# Patient Record
Sex: Male | Born: 1955 | Race: Black or African American | Hispanic: No | Smoking: Former smoker
Health system: Southern US, Community
[De-identification: ages and names within clinical notes are randomized; demographics above are authoritative.]

## PROBLEM LIST (undated history)

## (undated) DIAGNOSIS — E7800 Pure hypercholesterolemia, unspecified: Secondary | ICD-10-CM

## (undated) DIAGNOSIS — I10 Essential (primary) hypertension: Secondary | ICD-10-CM

## (undated) DIAGNOSIS — I82409 Acute embolism and thrombosis of unspecified deep veins of unspecified lower extremity: Secondary | ICD-10-CM

## (undated) DIAGNOSIS — R519 Headache, unspecified: Secondary | ICD-10-CM

## (undated) DIAGNOSIS — F32A Depression, unspecified: Secondary | ICD-10-CM

## (undated) DIAGNOSIS — K219 Gastro-esophageal reflux disease without esophagitis: Secondary | ICD-10-CM

## (undated) DIAGNOSIS — F329 Major depressive disorder, single episode, unspecified: Secondary | ICD-10-CM

## (undated) DIAGNOSIS — R51 Headache: Secondary | ICD-10-CM

## (undated) DIAGNOSIS — E119 Type 2 diabetes mellitus without complications: Secondary | ICD-10-CM

## (undated) DIAGNOSIS — I2510 Atherosclerotic heart disease of native coronary artery without angina pectoris: Secondary | ICD-10-CM

## (undated) DIAGNOSIS — M1990 Unspecified osteoarthritis, unspecified site: Secondary | ICD-10-CM

## (undated) DIAGNOSIS — N184 Chronic kidney disease, stage 4 (severe): Secondary | ICD-10-CM

## (undated) HISTORY — DX: Type 2 diabetes mellitus without complications: E11.9

## (undated) HISTORY — PX: CORONARY ANGIOPLASTY WITH STENT PLACEMENT: SHX49

## (undated) HISTORY — DX: Chronic kidney disease, stage 4 (severe): N18.4

## (undated) HISTORY — DX: Atherosclerotic heart disease of native coronary artery without angina pectoris: I25.10

---

## 2014-05-21 ENCOUNTER — Observation Stay (HOSPITAL_COMMUNITY)
Admission: EM | Admit: 2014-05-21 | Discharge: 2014-05-24 | Disposition: A | Discharge status: Home / Self Care | Payer: Medicaid Other | Attending: Internal Medicine | Admitting: Internal Medicine

## 2014-05-21 ENCOUNTER — Encounter (HOSPITAL_COMMUNITY): Payer: Self-pay

## 2014-05-21 ENCOUNTER — Other Ambulatory Visit (HOSPITAL_COMMUNITY): Payer: Self-pay

## 2014-05-21 ENCOUNTER — Emergency Department (HOSPITAL_COMMUNITY): Payer: Medicaid Other

## 2014-05-21 DIAGNOSIS — N189 Chronic kidney disease, unspecified: Secondary | ICD-10-CM | POA: Diagnosis not present

## 2014-05-21 DIAGNOSIS — M7989 Other specified soft tissue disorders: Secondary | ICD-10-CM | POA: Diagnosis not present

## 2014-05-21 DIAGNOSIS — I10 Essential (primary) hypertension: Secondary | ICD-10-CM | POA: Insufficient documentation

## 2014-05-21 DIAGNOSIS — I129 Hypertensive chronic kidney disease with stage 1 through stage 4 chronic kidney disease, or unspecified chronic kidney disease: Secondary | ICD-10-CM | POA: Diagnosis not present

## 2014-05-21 DIAGNOSIS — E876 Hypokalemia: Secondary | ICD-10-CM | POA: Diagnosis present

## 2014-05-21 DIAGNOSIS — N179 Acute kidney failure, unspecified: Secondary | ICD-10-CM

## 2014-05-21 DIAGNOSIS — Z87891 Personal history of nicotine dependence: Secondary | ICD-10-CM | POA: Insufficient documentation

## 2014-05-21 DIAGNOSIS — I160 Hypertensive urgency: Secondary | ICD-10-CM | POA: Diagnosis present

## 2014-05-21 DIAGNOSIS — R0789 Other chest pain: Principal | ICD-10-CM | POA: Insufficient documentation

## 2014-05-21 DIAGNOSIS — I252 Old myocardial infarction: Secondary | ICD-10-CM | POA: Diagnosis not present

## 2014-05-21 DIAGNOSIS — I25119 Atherosclerotic heart disease of native coronary artery with unspecified angina pectoris: Secondary | ICD-10-CM

## 2014-05-21 DIAGNOSIS — Z9114 Patient's other noncompliance with medication regimen: Secondary | ICD-10-CM | POA: Diagnosis not present

## 2014-05-21 DIAGNOSIS — Z79899 Other long term (current) drug therapy: Secondary | ICD-10-CM | POA: Diagnosis not present

## 2014-05-21 DIAGNOSIS — Z955 Presence of coronary angioplasty implant and graft: Secondary | ICD-10-CM | POA: Insufficient documentation

## 2014-05-21 DIAGNOSIS — E785 Hyperlipidemia, unspecified: Secondary | ICD-10-CM

## 2014-05-21 DIAGNOSIS — R079 Chest pain, unspecified: Secondary | ICD-10-CM | POA: Diagnosis present

## 2014-05-21 DIAGNOSIS — I2510 Atherosclerotic heart disease of native coronary artery without angina pectoris: Secondary | ICD-10-CM | POA: Diagnosis present

## 2014-05-21 HISTORY — DX: Essential (primary) hypertension: I10

## 2014-05-21 LAB — TROPONIN I
Troponin I: <0.03 ng/mL (ref <0.031)
Troponin I: <0.03 ng/mL (ref <0.031)

## 2014-05-21 LAB — HEPATIC FUNCTION PANEL
ALT: 21 U/L (ref 0–53)
AST: 26 U/L (ref 0–37)
Albumin: 4 g/dL (ref 3.5–5.2)
Alkaline Phosphatase: 70 U/L (ref 39–117)
Total Bilirubin: 0.8 mg/dL (ref 0.3–1.2)
Total Protein: 8.7 g/dL — ABNORMAL HIGH (ref 6.0–8.3)

## 2014-05-21 LAB — BASIC METABOLIC PANEL
Anion gap: 8 (ref 5–15)
BUN: 25 mg/dL — ABNORMAL HIGH (ref 6–23)
CO2: 31 mmol/L (ref 19–32)
CREATININE: 2.24 mg/dL — AB (ref 0.50–1.35)
Calcium: 9 mg/dL (ref 8.4–10.5)
Chloride: 96 mmol/L (ref 96–112)
GFR calc Af Amer: 35 mL/min — ABNORMAL LOW (ref >90)
GFR calc non Af Amer: 30 mL/min — ABNORMAL LOW (ref >90)
Glucose, Bld: 120 mg/dL — ABNORMAL HIGH (ref 70–99)
Potassium: 3.1 mmol/L — ABNORMAL LOW (ref 3.5–5.1)
SODIUM: 135 mmol/L (ref 135–145)

## 2014-05-21 LAB — CBC
HEMATOCRIT: 46.4 % (ref 39.0–52.0)
Hemoglobin: 15.4 g/dL (ref 13.0–17.0)
MCH: 28.9 pg (ref 26.0–34.0)
MCHC: 33.2 g/dL (ref 30.0–36.0)
MCV: 87.1 fL (ref 78.0–100.0)
Platelets: 292 10*3/uL (ref 150–400)
RBC: 5.33 MIL/uL (ref 4.22–5.81)
RDW: 13.5 % (ref 11.5–15.5)
WBC: 6.7 10*3/uL (ref 4.0–10.5)

## 2014-05-21 LAB — I-STAT TROPONIN, ED: Troponin i, poc: 0 ng/mL (ref 0.00–0.08)

## 2014-05-21 MED ORDER — AMLODIPINE BESYLATE 10 MG PO TABS
10.0000 mg | ORAL_TABLET | Freq: Once | ORAL | Status: AC
  Administered 2014-05-21: 10 mg via ORAL
  Filled 2014-05-21: qty 1

## 2014-05-21 MED ORDER — NITROGLYCERIN 0.4 MG SL SUBL
0.4000 mg | SUBLINGUAL_TABLET | SUBLINGUAL | Status: DC | PRN
  Administered 2014-05-21: 0.4 mg via SUBLINGUAL
  Filled 2014-05-21: qty 1

## 2014-05-21 MED ORDER — HEPARIN SODIUM (PORCINE) 5000 UNIT/ML IJ SOLN
5000.0000 [IU] | Freq: Three times a day (TID) | INTRAMUSCULAR | Status: DC
  Administered 2014-05-21 – 2014-05-24 (×10): 5000 [IU] via SUBCUTANEOUS
  Filled 2014-05-21 (×13): qty 1

## 2014-05-21 MED ORDER — HYDRALAZINE HCL 20 MG/ML IJ SOLN
5.0000 mg | INTRAMUSCULAR | Status: DC | PRN
  Administered 2014-05-22 (×2): 5 mg via INTRAVENOUS
  Filled 2014-05-21 (×3): qty 1

## 2014-05-21 MED ORDER — HYDROCHLOROTHIAZIDE 12.5 MG PO CAPS
25.0000 mg | ORAL_CAPSULE | Freq: Once | ORAL | Status: AC
  Administered 2014-05-21: 25 mg via ORAL
  Filled 2014-05-21: qty 2

## 2014-05-21 MED ORDER — MORPHINE SULFATE 4 MG/ML IJ SOLN
4.0000 mg | Freq: Once | INTRAMUSCULAR | Status: AC
  Administered 2014-05-21: 4 mg via INTRAVENOUS
  Filled 2014-05-21: qty 1

## 2014-05-21 MED ORDER — AMLODIPINE BESYLATE 5 MG PO TABS
5.0000 mg | ORAL_TABLET | Freq: Every day | ORAL | Status: DC
  Administered 2014-05-22 – 2014-05-24 (×3): 5 mg via ORAL
  Filled 2014-05-21 (×4): qty 1

## 2014-05-21 MED ORDER — SODIUM CHLORIDE 0.9 % IJ SOLN
3.0000 mL | Freq: Two times a day (BID) | INTRAMUSCULAR | Status: DC
  Administered 2014-05-23 – 2014-05-24 (×3): 3 mL via INTRAVENOUS

## 2014-05-21 MED ORDER — MORPHINE SULFATE 4 MG/ML IJ SOLN
4.0000 mg | Freq: Once | INTRAMUSCULAR | Status: DC
  Filled 2014-05-21: qty 1

## 2014-05-21 MED ORDER — SODIUM CHLORIDE 0.9 % IV SOLN
INTRAVENOUS | Status: AC
Start: 2014-05-21 — End: 2014-05-22
  Administered 2014-05-21: 16:00:00 via INTRAVENOUS

## 2014-05-21 MED ORDER — POTASSIUM CHLORIDE CRYS ER 20 MEQ PO TBCR
40.0000 meq | EXTENDED_RELEASE_TABLET | Freq: Once | ORAL | Status: AC
  Administered 2014-05-21: 40 meq via ORAL
  Filled 2014-05-21: qty 2

## 2014-05-21 MED ORDER — AMLODIPINE BESYLATE 5 MG PO TABS: 5.0000 mg | ORAL_TABLET | ORAL | Status: DC

## 2014-05-21 MED ORDER — CLOPIDOGREL BISULFATE 75 MG PO TABS
75.0000 mg | ORAL_TABLET | Freq: Every day | ORAL | Status: DC
  Administered 2014-05-21 – 2014-05-22 (×2): 75 mg via ORAL
  Filled 2014-05-21 (×3): qty 1

## 2014-05-21 MED ORDER — ASPIRIN EC 81 MG PO TBEC
81.0000 mg | DELAYED_RELEASE_TABLET | Freq: Every day | ORAL | Status: DC
  Administered 2014-05-21 – 2014-05-24 (×4): 81 mg via ORAL
  Filled 2014-05-21 (×5): qty 1

## 2014-05-21 MED ORDER — HYDRALAZINE HCL 20 MG/ML IJ SOLN
10.0000 mg | Freq: Once | INTRAMUSCULAR | Status: AC
  Administered 2014-05-21: 10 mg via INTRAVENOUS
  Filled 2014-05-21: qty 1

## 2014-05-21 NOTE — ED Provider Notes (Signed)
CSN: [id number]     Arrival date & time 05/21/14  9562 History   First MD Initiated Contact with Patient 05/21/14 1159     Chief Complaint  Patient presents with  . Blood Pressure Check  . Chest Pain  . Leg Swelling     (Consider location/radiation/quality/duration/timing/severity/associated sxs/prior Treatment) The history is provided by the patient.  Richard Barnett is a 59 y.o. Richard hx of HTN, MI, here presenting with hypertension, chest pain. He is a poor historian and states that he had cardiac stents done an outside hospital years ago. Patient has not been taking his blood pressure medicine. He has been having intermittent left-sided chest pain for the last several days. Not worse with exertion and no shortness of breath. He also has been having intermittent right leg swelling for the last 2 weeks. He recently moved here. He does not have a primary care doctor or cardiologist here and last took his Norvasc and HCTZ 2 weeks ago.   Past Medical History  Diagnosis Date  . Hypertension   . MI (myocardial infarction)    Past Surgical History  Procedure Laterality Date  . Cardiac catheterization    . Coronary angioplasty with stent placement     History reviewed. No pertinent family history. History  Substance Use Topics  . Smoking status: Former Games developer  . Smokeless tobacco: Not on file  . Alcohol Use: No    Review of Systems  Cardiovascular: Positive for chest pain.  All other systems reviewed and are negative.     Allergies  Penicillins  Home Medications   Prior to Admission medications   Medication Sig Start Date End Date Taking? Authorizing Provider  amLODipine (NORVASC) 5 MG tablet Take 5 mg by mouth every 7 (seven) days.    Yes Historical Provider, MD  hydrochlorothiazide (HYDRODIURIL) 25 MG tablet Take 25 mg by mouth every 7 (seven) days.    Yes Historical Provider, MD   BP 153/80 mmHg  Pulse 74  Temp(Src) 97.8 F (36.6 C) (Oral)  Resp 15  SpO2  100% Physical Exam  Constitutional: He is oriented to person, place, and time. He appears well-nourished.  HENT:  Head: Normocephalic.  Mouth/Throat: Oropharynx is clear and moist.  Eyes: Conjunctivae are normal. Pupils are equal, round, and reactive to light.  Neck: Normal range of motion. Neck supple.  Cardiovascular: Normal rate, regular rhythm and normal heart sounds.   Pulmonary/Chest: Effort normal and breath sounds normal. No respiratory distress. He has no wheezes. He has no rales.  Abdominal: Soft. Bowel sounds are normal. He exhibits no distension. There is no tenderness. There is no rebound.  Musculoskeletal: Normal range of motion.  ? R leg edema, mild R calf tenderness   Neurological: He is alert and oriented to person, place, and time. No cranial nerve deficit. Coordination normal.  Skin: Skin is warm and dry.  Psychiatric: He has a normal mood and affect. His behavior is normal. Judgment and thought content normal.  Nursing note and vitals reviewed.   ED Course  Procedures (including critical care time) Labs Review Labs Reviewed  BASIC METABOLIC PANEL - Abnormal; Notable for the following:    Potassium 3.1 (*)    Glucose, Bld 120 (*)    BUN 25 (*)    Creatinine, Ser 2.24 (*)    GFR calc non Af Amer 30 (*)    GFR calc Af Amer 35 (*)    All other components within normal limits  HEPATIC FUNCTION PANEL - Abnormal;  Notable for the following:    Total Protein 8.7 (*)    All other components within normal limits  CBC  I-STAT TROPOININ, ED    Imaging Review Dg Chest 2 View  05/21/2014   CLINICAL DATA:  Difficulty breathing and hypertension. Right lower extremity edema  EXAM: CHEST  2 VIEW  COMPARISON:  None.  FINDINGS: There is no edema or consolidation. Heart size and pulmonary vascularity are normal. No adenopathy. There is mid thoracic levoscoliosis.  IMPRESSION: No edema or consolidation.   Electronically Signed   By: Bretta Bang III M.D.   On: 05/21/2014  13:06   Ct Head Wo Contrast  05/21/2014   CLINICAL DATA:  Headache and altered mental status.  Hypertension  EXAM: CT HEAD WITHOUT CONTRAST  TECHNIQUE: Contiguous axial images were obtained from the base of the skull through the vertex without intravenous contrast.  COMPARISON:  None.  FINDINGS: Ventricle size is normal.  Cerebral volume is normal.  Hypodensity present in the frontal and parietal lobes bilaterally left greater than right. Hypodensity extends into the centrum semiovale and internal capsule left. These findings are most consistent with chronic microvascular ischemia  Negative for acute infarct.  Negative for hemorrhage or mass.  Calvarium intact.  IMPRESSION: Moderate white matter disease bilaterally with the appearance of chronic microvascular ischemia. No acute abnormality   Electronically Signed   By: Marlan Palau M.D.   On: 05/21/2014 13:47     EKG Interpretation None      MDM   Final diagnoses:  None    Richard Barnett is a 59 y.o. Barnett here with chest pain. Given cardiac stents, high risk for ACS or in stent stenosis. Also consider R leg DVT given leg swelling. No SOB so not concerned for PE. Will get labs, trop, R leg doppler.   3:14 PM R leg doppler neg. Cr 2.2 no baseline. BP dec to 180 after norvasc, hctz but jumped back up to 230/129. Given hydralazine and bp dec to 150/80 and pain improved. Will admit for difficult to control HTN, chest pain rule out.     Richardean Canal, MD 05/21/14 1515

## 2014-05-21 NOTE — ED Notes (Signed)
Patient belongings: Richard Barnett, shoes, shirt, short pants, and  Phone is placed in a white bag and will be transfered up stairs with patient.

## 2014-05-21 NOTE — ED Notes (Signed)
Ok to give food per Dr. Silverio Lay

## 2014-05-21 NOTE — Progress Notes (Signed)
Pt seen by [facility] staff and given [facility] and [place] self pay resources

## 2014-05-21 NOTE — Progress Notes (Addendum)
CM consulted by EDP, Silverio Lay to assist pt community resources. Spoke with pt who states he has been in [place] for 3 weeks. Obtained a medicaid card for [place] that CM gave back to pt to place back in his wallet while [employer] staff present to check LEs for DVT. Cm provided to ED registration for scanning and running. Pt mentioned in assessment that the he had been in other cities also.   CM spoke with pt about [facility] for f/u services CM spoke with pt who confirms he is now staying in [facility] with no pcp or CV. CM discussed and provided written information for self pay pcps, importance of pcp for f/u care, www.needymeds.org, www.goodrx.com, discounted pharmacies and other [employer] such as [employer], [employer], affordable care act,  financial assistance, DSS and  health department  Reviewed resources for [employer] self pay pcps like [facility], family medicine at [address], [facility] family practice, general medical clinics, [facility] urgent care plus others, medication resources, [facility] out patient pharmacies and housing Pt voiced understanding and appreciation of resources provided  CM noted pt with disorganized, anxious behavior when he was discussing his medical care.  Discussed pt's behavior with ED RN, Lyla Son and Dr Silverio Lay who also noted pt with same behavior and questionable mentality or knowledge base.     1350 called [facility] to attempt to get pt an appt but none available Cm was recommended to have pt call at 9 am any day to get first come appt Placed this information in pt f/u d/c section 1355 Cm attempted to call [facility] to get pt an appt but no answer Cm noted on pt scanned license in EPIC that it was issued in February 2016 less than a month ago  Pt states he has gone to DSS to have Medicaid changed and is awaiting a list of medicaid doctors  1523 CM spoke with Myrene Buddy listed in EPIC as pt's father at her home number.   Myrene Buddy confirms pt came from [place] about a "month  ago" to live with her and she is just really developing a relationship with her father after many years.  Cm explained pt behavior in ED. Daughter states " I don't know if he is slow are not but I have been noticing the same thing here.  He will call my uncles, aunts.  He asks if my mother is dead and would have just spoken to her.  He use to stay with my Grandmother in [place]."  "I am not sure if this is early alzheimer. " Cm discussed and encouraged her to have pt follow up at [facility] for pcp services until he gets a medicaid pcp and to f/u with a neurologist to assist with evaluation of memory or alzheimer. She voiced understanding and appreciation. Told her cm put resources in pt belonging bag Confirms pt had stents place but she does not know where. Dr Lafe Garin came to visit while CM was speaking with daughter and he asked assessment questions and informed Myrene Buddy of plan of care for observation.  Myrene Buddy confirms she no

## 2014-05-21 NOTE — ED Notes (Signed)
Charge nurse Jennie called..16:06 pt can go to floor....klj

## 2014-05-21 NOTE — H&P (Signed)
History and Physical    Richard Barnett ZES:[id number] DOB: 1956-02-15 DOA: 05/21/2014  Referring physician: Dr. Silverio Lay PCP: No primary care provider on file.  Specialists: none   Chief Complaint: chest pain  HPI: Richard Barnett is a 59 y.o. Richard Barnett who recently moved to [place] from [place] about 3 weeks ago, presents to the emergency room with a chief complaint of chest pain on and off for a few days. Patient is a rather poor historian, I was able to talk to her daughter over the phone, and she tells me that he has been acting "funny" over the last few days. His story seems to be changing for the EDP. Patient tells me that he has been having a history of heart disease status post 2 stents placement about a year ago in [place], as well as a history of kidney disease. He is not sure if he had a heart attack in the past. He denies any shortness of breath, abdominal pain, nausea vomiting or diarrhea. He has a history of tobacco abuse, quit 7 years ago, as well as history of alcohol abuse, quit 7 years ago. He describes his chest pain as being left-sided, nonradiating, lasting a few minutes at a time. He is currently chest pain-free. He hasn't been taking his home medications for the past 2 weeks. He remembers being on Plavix after his stents last year, however tells me that he has been off of his Plavix for "a long time" but cannot quantify. In the emergency room, his blood pressure was found to be elevated to 190-230s systolic, EKG with nonspecific T-wave inversions in V4 5 and inferior leads, no ST segment elevation, initial troponin was negative, chest x-ray without acute abnormalities, and his blood shows renal failure with a creatinine of 2.4, unknown baseline.  Review of Systems: as per HPI otherwise negative  Past Medical History  Diagnosis Date  . Hypertension   . MI (myocardial infarction)    Past Surgical History  Procedure Laterality Date  . Cardiac catheterization    . Coronary  angioplasty with stent placement     Social History:  reports that he has quit smoking. He has never used smokeless tobacco. He reports that he does not drink alcohol or use illicit drugs.  Allergies  Allergen Reactions  . Penicillins Swelling and Rash   Family history positive for MI in his father, heart disease in his mother, his brother has diabetes  Prior to Admission medications   Medication Sig Start Date End Date Taking? Authorizing Provider  amLODipine (NORVASC) 5 MG tablet Take 5 mg by mouth every 7 (seven) days.    Yes Historical Provider, MD  hydrochlorothiazide (HYDRODIURIL) 25 MG tablet Take 25 mg by mouth every 7 (seven) days.    Yes Historical Provider, MD   Physical Exam: Filed Vitals:   05/21/14 1300 05/21/14 1400 05/21/14 1449 05/21/14 1500  BP: 180/111 232/129 153/80 156/90  Pulse: 66 68 74   Temp:      TempSrc:      Resp: 17 16 15 14   SpO2: 100% 95% 100%      General:  No apparent distress  Eyes: EOMI, no scleral icterus  ENT: moist oropharynx  Neck: supple, no lymphadenopathy  Cardiovascular: regular rate without MRG; 2+ peripheral pulses, no JVD, no peripheral edema  Respiratory: CTA biL, good air movement without wheezing, rhonchi or crackled  Abdomen: soft, non tender to palpation, positive bowel sounds, no guarding, no rebound  Skin: no rashes  Musculoskeletal:  normal bulk and tone, no joint swelling  Psychiatric: normal mood and affect  Neurologic: CN 2-12 grossly intact, MS 5/5 in all 4  Labs on Admission:  Basic Metabolic Panel:  Recent Labs Lab 05/21/14 1230  NA 135  K 3.1*  CL 96  CO2 31  GLUCOSE 120*  BUN 25*  CREATININE 2.24*  CALCIUM 9.0   Liver Function Tests:  Recent Labs Lab 05/21/14 1230  AST 26  ALT 21  ALKPHOS 70  BILITOT 0.8  PROT 8.7*  ALBUMIN 4.0   CBC:  Recent Labs Lab 05/21/14 1230  WBC 6.7  HGB 15.4  HCT 46.4  MCV 87.1  PLT 292   Radiological Exams on Admission: Dg Chest 2  View  05/21/2014   CLINICAL DATA:  Difficulty breathing and hypertension. Right lower extremity edema  EXAM: CHEST  2 VIEW  COMPARISON:  None.  FINDINGS: There is no edema or consolidation. Heart size and pulmonary vascularity are normal. No adenopathy. There is mid thoracic levoscoliosis.  IMPRESSION: No edema or consolidation.   Electronically Signed   By: Bretta Bang III M.D.   On: 05/21/2014 13:06   Ct Head Wo Contrast  05/21/2014   CLINICAL DATA:  Headache and altered mental status.  Hypertension  EXAM: CT HEAD WITHOUT CONTRAST  TECHNIQUE: Contiguous axial images were obtained from the base of the skull through the vertex without intravenous contrast.  COMPARISON:  None.  FINDINGS: Ventricle size is normal.  Cerebral volume is normal.  Hypodensity present in the frontal and parietal lobes bilaterally left greater than right. Hypodensity extends into the centrum semiovale and internal capsule left. These findings are most consistent with chronic microvascular ischemia  Negative for acute infarct.  Negative for hemorrhage or mass.  Calvarium intact.  IMPRESSION: Moderate white matter disease bilaterally with the appearance of chronic microvascular ischemia. No acute abnormality   Electronically Signed   By: Marlan Palau M.D.   On: 05/21/2014 13:47    EKG: Independently reviewed.  Assessment/Plan Principal Problem:   Chest pain Active Problems:   Hypokalemia   Acute kidney injury   CAD (coronary artery disease)   Hypertension   Chest pain  - describes atypical chest pain, fleeting, intermittent - EKG with non specific TWI, no prior EKGs to compare, he is now chest pain free - initial troponin negative, will cycle x 3 overnight - monitor on telemetry - repeat EKG in am   CAD s/p stenting - daughter confirms stents placed "about a year ago" corroborating the patient's story. Patient not sure about the hospital name.  - off of Plavix for unknown amount of time - restart Plavix. No  history of bleed. - Aspirin - 2D echo  Hypertension - poorly controlled, non compliant with his home medications - improved after IV hydralazine in the ED, received his home Norvasc and HCTZ - resume home medications  AKI  - unknown baseline, patient knows about "kidney problems" but cannot elaborate - suspect a degree of CKD in the setting of poorly controlled HTN - gentle hydration overnight, repeat BMP in am   Hypokalemia  - replete, repeat in am    Diet: heart healthy Fluids: NS  DVT Prophylaxis: heparin  Code Status: Full  Family Communication: d/w daughter over the phone   Disposition Plan: admit to obs  Time spent: 30  Braelynn Lupton M. Elvera Lennox, MD [facility] Pager [phone]  If 7PM-7AM, please contact night-coverage [email or web address] Password [facility] 05/21/2014, 3:41 PM

## 2014-05-21 NOTE — ED Notes (Addendum)
Pt presents wanting a HTN check and c/o intermittent L side chest pain increasing since last night and R knee pain/swelling x 2 weeks.  Pain score 10/10.  Pt reports that he doesn't have a PCP and needs a HTN medication refill.  Hx of cardiac stents.

## 2014-05-21 NOTE — Progress Notes (Signed)
VASCULAR LAB PRELIMINARY  PRELIMINARY  PRELIMINARY  PRELIMINARY  Right lower extremity venous duplex completed.    Preliminary report:  Right:  No evidence of DVT, superficial thrombosis, or Baker's cyst.  Daris Harkins, RVT 05/21/2014, 1:07 PM

## 2014-05-22 DIAGNOSIS — I252 Old myocardial infarction: Secondary | ICD-10-CM | POA: Diagnosis not present

## 2014-05-22 DIAGNOSIS — R0789 Other chest pain: Secondary | ICD-10-CM | POA: Diagnosis not present

## 2014-05-22 DIAGNOSIS — I270 Primary pulmonary hypertension: Secondary | ICD-10-CM

## 2014-05-22 DIAGNOSIS — I209 Angina pectoris, unspecified: Secondary | ICD-10-CM

## 2014-05-22 DIAGNOSIS — Z8249 Family history of ischemic heart disease and other diseases of the circulatory system: Secondary | ICD-10-CM

## 2014-05-22 DIAGNOSIS — Z955 Presence of coronary angioplasty implant and graft: Secondary | ICD-10-CM | POA: Diagnosis not present

## 2014-05-22 DIAGNOSIS — I2510 Atherosclerotic heart disease of native coronary artery without angina pectoris: Secondary | ICD-10-CM

## 2014-05-22 DIAGNOSIS — E785 Hyperlipidemia, unspecified: Secondary | ICD-10-CM

## 2014-05-22 DIAGNOSIS — M7989 Other specified soft tissue disorders: Secondary | ICD-10-CM | POA: Diagnosis not present

## 2014-05-22 DIAGNOSIS — N189 Chronic kidney disease, unspecified: Secondary | ICD-10-CM

## 2014-05-22 DIAGNOSIS — R7989 Other specified abnormal findings of blood chemistry: Secondary | ICD-10-CM

## 2014-05-22 DIAGNOSIS — R079 Chest pain, unspecified: Secondary | ICD-10-CM

## 2014-05-22 LAB — LIPID PANEL
CHOLESTEROL: 275 mg/dL — AB (ref 0–200)
HDL: 37 mg/dL — AB (ref >39)
LDL Cholesterol: 203 mg/dL — ABNORMAL HIGH (ref 0–99)
TRIGLYCERIDES: 173 mg/dL — AB (ref <150)
Total CHOL/HDL Ratio: 7.4 RATIO
VLDL: 35 mg/dL (ref 0–40)

## 2014-05-22 LAB — COMPREHENSIVE METABOLIC PANEL
ALT: 48 U/L (ref 0–53)
AST: 51 U/L — ABNORMAL HIGH (ref 0–37)
Albumin: 3.3 g/dL — ABNORMAL LOW (ref 3.5–5.2)
Alkaline Phosphatase: 88 U/L (ref 39–117)
Anion gap: 7 (ref 5–15)
BUN: 26 mg/dL — ABNORMAL HIGH (ref 6–23)
CO2: 29 mmol/L (ref 19–32)
Calcium: 8.4 mg/dL (ref 8.4–10.5)
Chloride: 102 mmol/L (ref 96–112)
Creatinine, Ser: 2.15 mg/dL — ABNORMAL HIGH (ref 0.50–1.35)
GFR, EST AFRICAN AMERICAN: 37 mL/min — AB (ref >90)
GFR, EST NON AFRICAN AMERICAN: 32 mL/min — AB (ref >90)
GLUCOSE: 110 mg/dL — AB (ref 70–99)
Potassium: 3.5 mmol/L (ref 3.5–5.1)
SODIUM: 138 mmol/L (ref 135–145)
Total Bilirubin: 0.9 mg/dL (ref 0.3–1.2)
Total Protein: 6.9 g/dL (ref 6.0–8.3)

## 2014-05-22 LAB — CBC
HCT: 41.8 % (ref 39.0–52.0)
HEMOGLOBIN: 13.6 g/dL (ref 13.0–17.0)
MCH: 28.2 pg (ref 26.0–34.0)
MCHC: 32.5 g/dL (ref 30.0–36.0)
MCV: 86.7 fL (ref 78.0–100.0)
Platelets: 262 10*3/uL (ref 150–400)
RBC: 4.82 MIL/uL (ref 4.22–5.81)
RDW: 13.5 % (ref 11.5–15.5)
WBC: 6.6 10*3/uL (ref 4.0–10.5)

## 2014-05-22 LAB — TROPONIN I: TROPONIN I: 0.03 ng/mL (ref <0.031)

## 2014-05-22 LAB — HIV ANTIBODY (ROUTINE TESTING W REFLEX): HIV Screen 4th Generation wRfx: NONREACTIVE

## 2014-05-22 MED ORDER — ATORVASTATIN CALCIUM 10 MG PO TABS
20.0000 mg | ORAL_TABLET | Freq: Every day | ORAL | Status: DC
  Administered 2014-05-22: 20 mg via ORAL
  Filled 2014-05-22: qty 2

## 2014-05-22 MED ORDER — ACETAMINOPHEN 325 MG PO TABS
650.0000 mg | ORAL_TABLET | Freq: Four times a day (QID) | ORAL | Status: DC | PRN
  Administered 2014-05-22 – 2014-05-23 (×2): 650 mg via ORAL
  Filled 2014-05-22 (×2): qty 2

## 2014-05-22 MED ORDER — DIPHENHYDRAMINE HCL 50 MG/ML IJ SOLN
12.5000 mg | Freq: Once | INTRAMUSCULAR | Status: AC
  Administered 2014-05-22: 12.5 mg via INTRAVENOUS
  Filled 2014-05-22: qty 1

## 2014-05-22 MED ORDER — METOPROLOL TARTRATE 25 MG PO TABS
25.0000 mg | ORAL_TABLET | Freq: Two times a day (BID) | ORAL | Status: DC
  Administered 2014-05-22 (×2): 25 mg via ORAL
  Filled 2014-05-22 (×2): qty 1

## 2014-05-22 MED ORDER — PROCHLORPERAZINE EDISYLATE 5 MG/ML IJ SOLN
5.0000 mg | Freq: Once | INTRAMUSCULAR | Status: AC
  Administered 2014-05-22: 5 mg via INTRAVENOUS
  Filled 2014-05-22: qty 2

## 2014-05-22 NOTE — Progress Notes (Signed)
  Echocardiogram 2D Echocardiogram has been performed.  Richard Barnett M 05/22/2014, 2:50 PM

## 2014-05-22 NOTE — Progress Notes (Signed)
UR completed 

## 2014-05-22 NOTE — Progress Notes (Signed)
PROGRESS NOTE  Richard Barnett OEH:[id number] DOB: [birth year] DOA: 05/21/2014 PCP: No primary care provider on file.  Brief history 59 year old Richard Barnett with history of coronary artery disease, hypertension presented with one-day history of chest discomfort. The patient is a very poor historian. He recently moved from [place] to be with his daughter approximately 3 weeks ago. Apparently, the patient had coronary stents placed less than 1 year ago. He is unable to tell me the exact length of time. The patient is poorly compliant with his medications. He was previously taken aspirin and Plavix as well as some antihypertensive medications. He only takes his meds "once in a blue moon". Assessment/Plan: Atypical chest pain atypical -EKG with T-wave inversion in lead III, aVF and V5, V6 -Concerned that the patient has not been taking his aspirin and Plavix as prescribed -Consult cardiology -Restart aspirin and Plavix -Echocardiogram -Troponins negative 3 Hypertension, poorly controlled -Restart amlodipine -Start metoprolol tartrate CKD  -Unclear renal baseline -Judicious IV fluids -Monitor BMP Hypokalemia -Replete CAD with stent -Unclear medical history -Per patient and daughter the patient has had PTC in past year Hyperlipidemia -LDL 203 -Start statin   Family Communication:  Tried to call daughter--left voicemail Disposition Plan:   Home when medically stable       Procedures/Studies: Dg Chest 2 View  05/21/2014   CLINICAL DATA:  Difficulty breathing and hypertension. Right lower extremity edema  EXAM: CHEST  2 VIEW  COMPARISON:  None.  FINDINGS: There is no edema or consolidation. Heart size and pulmonary vascularity are normal. No adenopathy. There is mid thoracic levoscoliosis.  IMPRESSION: No edema or consolidation.   Electronically Signed   By: Lowella Grip III M.D.   On: 05/21/2014 13:06   Ct Head Wo Contrast  05/21/2014   CLINICAL DATA:  Headache and altered  mental status.  Hypertension  EXAM: CT HEAD WITHOUT CONTRAST  TECHNIQUE: Contiguous axial images were obtained from the base of the skull through the vertex without intravenous contrast.  COMPARISON:  None.  FINDINGS: Ventricle size is normal.  Cerebral volume is normal.  Hypodensity present in the frontal and parietal lobes bilaterally left greater than right. Hypodensity extends into the centrum semiovale and internal capsule left. These findings are most consistent with chronic microvascular ischemia  Negative for acute infarct.  Negative for hemorrhage or mass.  Calvarium intact.  IMPRESSION: Moderate white matter disease bilaterally with the appearance of chronic microvascular ischemia. No acute abnormality   Electronically Signed   By: Franchot Gallo M.D.   On: 05/21/2014 13:47         Subjective: Patient complains of frontal headache. He denies any present chest pain, shortness breath, nausea, vomiting, diarrhea, abdominal pain. No dysuria or hematuria.   Objective: Filed Vitals:   05/21/14 2251 05/22/14 0426 05/22/14 0429 05/22/14 0529  BP: 155/88 198/105 189/104 167/87  Pulse: 85 71    Temp: 98.2 F (36.8 C) 97.5 F (36.4 C)    TempSrc: Oral Oral    Resp: 16 16    Height:      Weight:      SpO2: 100% 99%      Intake/Output Summary (Last 24 hours) at 05/22/14 1046 Last data filed at 05/22/14 0600  Gross per 24 hour  Intake  712.5 ml  Output      0 ml  Net  712.5 ml   Weight change:  Exam:   General:  Pt is alert, follows commands appropriately,  not in acute distress  HEENT: No icterus, No thrush,  [place]/AT  Cardiovascular: RRR, S1/S2, no rubs, no gallops  Respiratory: diminished BS but CTA  Abdomen: Soft/+BS, non tender, non distended, no guarding  Extremities: trace LE edema, No lymphangitis, No petechiae, No rashes, no synovitis  Data Reviewed: Basic Metabolic Panel:  Recent Labs Lab 05/21/14 1230 05/22/14 0410  NA 135 138  K 3.1* 3.5  CL 96 102  CO2  31 29  GLUCOSE 120* 110*  BUN 25* 26*  CREATININE 2.24* 2.15*  CALCIUM 9.0 8.4   Liver Function Tests:  Recent Labs Lab 05/21/14 1230 05/22/14 0410  AST 26 51*  ALT 21 48  ALKPHOS 70 88  BILITOT 0.8 0.9  PROT 8.7* 6.9  ALBUMIN 4.0 3.3*   No results for input(s): LIPASE, AMYLASE in the last 168 hours. No results for input(s): AMMONIA in the last 168 hours. CBC:  Recent Labs Lab 05/21/14 1230 05/22/14 0410  WBC 6.7 6.6  HGB 15.4 13.6  HCT 46.4 41.8  MCV 87.1 86.7  PLT 292 262   Cardiac Enzymes:  Recent Labs Lab 05/21/14 1607 05/21/14 2138 05/22/14 0410  TROPONINI <0.03 <0.03 0.03   BNP: Invalid input(s): POCBNP CBG: No results for input(s): GLUCAP in the last 168 hours.  No results found for this or any previous visit (from the past 240 hour(s)).   Scheduled Meds: . amLODipine  5 mg Oral Daily  . aspirin EC  81 mg Oral Daily  . clopidogrel  75 mg Oral Daily  . heparin  5,000 Units Subcutaneous 3 times per day  .  morphine injection  4 mg Intravenous Once  . sodium chloride  3 mL Intravenous Q12H   Continuous Infusions:    Richard Kulig, DO  [facility] Pager [phone]  If 7PM-7AM, please contact night-coverage [email or web address] Password [facility] 05/22/2014, 10:46 AM

## 2014-05-22 NOTE — Consult Note (Signed)
CARDIOLOGY CONSULT NOTE   Patient ID: Richard Barnett MRN: [id number], DOB/AGE: 60-Oct-1957   Admit date: 05/21/2014 Date of Consult: 05/22/2014  Primary Physician: No primary care provider on file. Primary Cardiologist: Macario Carls   Reason for consult:  Chest pain.  Problem List  Past Medical History  Diagnosis Date  . Hypertension   . MI (myocardial infarction)     Past Surgical History  Procedure Laterality Date  . Cardiac catheterization    . Coronary angioplasty with stent placement       Allergies  Allergies  Allergen Reactions  . Penicillins Swelling and Rash    HPI   59 year old Markel with history of coronary artery disease, hypertension presented with chest pain, SOB and leg swelling. The patient is a very poor historian. He recently moved from [place] to be with his daughter approximately 3 weeks ago.  The patient states that he had 2 stents placed in [place] 3 years ago. His chest pain has improved but recently recurred and is waking him up at night. He is not sure if he has CP while walking but he gets SOB. He was previously taken aspirin and Plavix as well as some antihypertensive medications but states that he was not taking meds as "they don't work because they don't help him to sleep.   He denies palpitations or syncope. Denies orthopnea or PND. He is also complaining of right lower leg swelling that has been present for months. It hurts when he walks. He stopped smoking 7 years ago. He has significant FH of premature CAD, his mother had MI at age 63 and father at age 19, both deceased now.   Inpatient Medications  . amLODipine  5 mg Oral Daily  . aspirin EC  81 mg Oral Daily  . atorvastatin  20 mg Oral q1800  . clopidogrel  75 mg Oral Daily  . heparin  5,000 Units Subcutaneous 3 times per day  . metoprolol tartrate  25 mg Oral BID  .  morphine injection  4 mg Intravenous Once  . sodium chloride  3 mL Intravenous Q12H    Family  History History reviewed. No pertinent family history.   Social History History   Social History  . Marital Status: Single    Spouse Name: N/A  . Number of Children: N/A  . Years of Education: N/A   Occupational History  . Not on file.   Social History Main Topics  . Smoking status: Former Games developer  . Smokeless tobacco: Never Used  . Alcohol Use: No  . Drug Use: No  . Sexual Activity: Not Currently   Other Topics Concern  . Not on file   Social History Narrative  . No narrative on file     Review of Systems  General:  No chills, fever, night sweats or weight changes.  Cardiovascular:  No chest pain, dyspnea on exertion, edema, orthopnea, palpitations, paroxysmal nocturnal dyspnea. Dermatological: No rash, lesions/masses Respiratory: No cough, dyspnea Urologic: No hematuria, dysuria Abdominal:   No nausea, vomiting, diarrhea, bright red blood per rectum, melena, or hematemesis Neurologic:  No visual changes, wkns, changes in mental status. All other systems reviewed and are otherwise negative except as noted above.  Physical Exam  Blood pressure 167/87, pulse 71, temperature 97.5 F (36.4 C), temperature source Oral, resp. rate 16, height  (1.702 m), weight 219 lb 6.4 oz (99.519 kg), SpO2 99 %.  General: Pleasant, NAD Psych: Normal affect. Neuro: Alert and oriented X 3. Moves  all extremities spontaneously. HEENT: Normal  Neck: Supple without bruits or JVD. Lungs:  Resp regular and unlabored, CTA. Heart: RRR no s3, s4, or murmurs. Abdomen: Soft, non-tender, non-distended, BS + x 4.  Extremities: No clubbing, cyanosis , asymetric right thigh swelling. DP/PT/Radials 2+ and equal bilaterally.  Labs   Recent Labs  05/21/14 1607 05/21/14 2138 05/22/14 0410  TROPONINI <0.03 <0.03 0.03   Lab Results  Component Value Date   WBC 6.6 05/22/2014   HGB 13.6 05/22/2014   HCT 41.8 05/22/2014   MCV 86.7 05/22/2014   PLT 262 05/22/2014    Recent Labs Lab  05/22/14 0410  NA 138  K 3.5  CL 102  CO2 29  BUN 26*  CREATININE 2.15*  CALCIUM 8.4  PROT 6.9  BILITOT 0.9  ALKPHOS 88  ALT 48  AST 51*  GLUCOSE 110*   Lab Results  Component Value Date   CHOL 275* 05/22/2014   HDL 37* 05/22/2014   LDLCALC 203* 05/22/2014   TRIG 173* 05/22/2014   No results found for: DDIMER Invalid input(s): POCBNP  Radiology/Studies  Dg Chest 2 View  05/21/2014   CLINICAL DATA:  Difficulty breathing and hypertension. Right lower extremity edema  IMPRESSION: No edema or consolidation.     Ct Head Wo Contrast  05/21/2014   CLINICAL DATA:  Headache and altered mental status.  Hypertension   IMPRESSION: Moderate white matter disease bilaterally with the appearance of chronic microvascular ischemia. No acute abnormality     Echocardiogram - preliminary report:   ECG: ST, negative T waves in I, II, III, V4-6, suspicious of inferolateral ischemia, no prior available for comparison    ASSESSMENT AND PLAN  1. Chest pain; atypical, however high risk patient considering prior stenting, medication non-compliance, poorly controlled HTN and significant family h/o CAD - I have previewed bedside echocardiogram and he has normal LVEF with no regional wall motion abnormalities, but moderate concentric LVH, abnormal relaxation and moderate pulmonary hypertension  - the mainstay of therapy would be aggressive management of hypertension and CAD, including aspirin and atorvastatin, no need for plavix as his stent was placed 3 years ago and he is concerned about financial burden  - considering his CKD stage III (? Acute vs chronic) we would schedule a lexiscan nuclear stress test  2. Hypertension, poorly controlled, sec to medication non-complaince, moderate concentric LVH - I agree with restarting amlodipine and metoprolol   3. Moderate pulmonary HTN - pulmonary embolism and chronic thromboembolic disease should be considered,  Amlodipine is a good antihypertensive  chice  4. Hypokalemia  5. Hyperlipidemia - agree with atorvastatin, but decrease to 10 mg po daily, follow LFTs as AST is mildly elevated  6. LE edema - unilateral - order venous Duplex to rule out DVT  I have personally reviewed echocardiogram.  Signed, Lars Masson, MD, [facility] 05/22/2014, 2:10 PM

## 2014-05-23 DIAGNOSIS — E876 Hypokalemia: Secondary | ICD-10-CM

## 2014-05-23 LAB — HEMOGLOBIN A1C
HEMOGLOBIN A1C: 6.4 % — AB (ref 4.8–5.6)
Mean Plasma Glucose: 137 mg/dL

## 2014-05-23 LAB — BASIC METABOLIC PANEL
ANION GAP: 10 (ref 5–15)
BUN: 29 mg/dL — ABNORMAL HIGH (ref 6–23)
CALCIUM: 9 mg/dL (ref 8.4–10.5)
CHLORIDE: 102 mmol/L (ref 96–112)
CO2: 25 mmol/L (ref 19–32)
Creatinine, Ser: 2 mg/dL — ABNORMAL HIGH (ref 0.50–1.35)
GFR calc non Af Amer: 35 mL/min — ABNORMAL LOW (ref >90)
GFR, EST AFRICAN AMERICAN: 40 mL/min — AB (ref >90)
GLUCOSE: 129 mg/dL — AB (ref 70–99)
Potassium: 3.5 mmol/L (ref 3.5–5.1)
SODIUM: 137 mmol/L (ref 135–145)

## 2014-05-23 MED ORDER — HYDRALAZINE HCL 20 MG/ML IJ SOLN
10.0000 mg | Freq: Once | INTRAMUSCULAR | Status: AC
  Administered 2014-05-23: 10 mg via INTRAVENOUS
  Filled 2014-05-23: qty 1

## 2014-05-23 MED ORDER — METOPROLOL TARTRATE 50 MG PO TABS
50.0000 mg | ORAL_TABLET | Freq: Two times a day (BID) | ORAL | Status: DC
  Administered 2014-05-23 – 2014-05-24 (×3): 50 mg via ORAL
  Filled 2014-05-23 (×3): qty 1

## 2014-05-23 MED ORDER — ATORVASTATIN CALCIUM 10 MG PO TABS: 10.0000 mg | ORAL_TABLET | Freq: Every day | ORAL | Status: DC

## 2014-05-23 MED ORDER — ATORVASTATIN CALCIUM 40 MG PO TABS
40.0000 mg | ORAL_TABLET | Freq: Every day | ORAL | Status: DC
  Administered 2014-05-23 – 2014-05-24 (×2): 40 mg via ORAL
  Filled 2014-05-23 (×2): qty 1

## 2014-05-23 NOTE — Progress Notes (Signed)
PROGRESS NOTE  Richard Barnett XYB:[id number] DOB: 10-Sep-1955 DOA: 05/21/2014 PCP: No primary care provider on file.  Brief history 59 year old Richard Barnett with history of coronary artery disease, hypertension presented with one-day history of chest discomfort. The patient is a very poor historian. He recently moved from [place] to be with his daughter approximately 3 weeks ago. Apparently, the patient had coronary stents placed less than 1 year ago. He is unable to tell me the exact length of time. The patient is poorly compliant with his medications. He was previously taken aspirin and Plavix as well as some antihypertensive medications. He only takes his meds "once in a blue moon". Assessment/Plan: Atypical chest pain atypical -EKG with T-wave inversion in lead III, aVF and V5, V6 -Concerned that the patient has not been taking his aspirin and Plavix as prescribed -Appreciate cardiology--lexisan 3/18 -case discussed with Dr. Kara Pacer clinical suspicion for PE, defer V/Q (renal failure prevents CTA) -Restart aspirin -Echocardiogram--EF 66-06%, grade 1 diastolic dysfunction, no WMA -Troponins negative 3 Hypertension, poorly controlled -Restart amlodipine -Start metoprolol tartrate--increase to 2m bid CKD  -Unclear renal baseline -Judicious IV fluids -Monitor BMP Hypokalemia -Replete CAD with stent -Unclear medical history -Per patient and daughter the patient has had PTC 3 yrs ago -plavix discontinued -continue ASA Hyperlipidemia -LDL 203 -Start statin   Family Communication: Tried to call daughter--left voicemail Disposition Plan: Home 3/18 if stress test neg    Procedures/Studies: Dg Chest 2 View  05/21/2014   CLINICAL DATA:  Difficulty breathing and hypertension. Right lower extremity edema  EXAM: CHEST  2 VIEW  COMPARISON:  None.  FINDINGS: There is no edema or consolidation. Heart size and pulmonary vascularity are normal. No adenopathy. There is mid thoracic  levoscoliosis.  IMPRESSION: No edema or consolidation.   Electronically Signed   By: WLowella GripIII M.D.   On: 05/21/2014 13:06   Ct Head Wo Contrast  05/21/2014   CLINICAL DATA:  Headache and altered mental status.  Hypertension  EXAM: CT HEAD WITHOUT CONTRAST  TECHNIQUE: Contiguous axial images were obtained from the base of the skull through the vertex without intravenous contrast.  COMPARISON:  None.  FINDINGS: Ventricle size is normal.  Cerebral volume is normal.  Hypodensity present in the frontal and parietal lobes bilaterally left greater than right. Hypodensity extends into the centrum semiovale and internal capsule left. These findings are most consistent with chronic microvascular ischemia  Negative for acute infarct.  Negative for hemorrhage or mass.  Calvarium intact.  IMPRESSION: Moderate white matter disease bilaterally with the appearance of chronic microvascular ischemia. No acute abnormality   Electronically Signed   By: CFranchot GalloM.D.   On: 05/21/2014 13:47         Subjective: Patient has intermittent chest pain. Denies any fevers, chills, shortness breath, nausea, vomiting or diarrhea, abdominal pain, dysuria, hematuria.  Objective: Filed Vitals:   05/23/14 0207 05/23/14 0208 05/23/14 0419 05/23/14 1300  BP: 184/122 185/116 140/92 149/84  Pulse: 91 79 90 88  Temp:   97.8 F (36.6 C) 98.1 F (36.7 C)  TempSrc:   Oral Oral  Resp:   16 18  Height:      Weight:      SpO2:   96% 100%    Intake/Output Summary (Last 24 hours) at 05/23/14 1817 Last data filed at 05/23/14 1700  Gross per 24 hour  Intake   1196 ml  Output    725 ml  Net  471 ml   Weight change:  Exam:   General:  Pt is alert, follows commands appropriately, not in acute distress  HEENT: No icterus, No thrush, No neck mass, [place]/AT  Cardiovascular: RRR, S1/S2, no rubs, no gallops  Respiratory: CTA bilaterally, no wheezing, no crackles, no rhonchi  Abdomen: Soft/+BS, non tender, non  distended, no guarding  Extremities: No edema, No lymphangitis, No petechiae, No rashes, no synovitis  Data Reviewed: Basic Metabolic Panel:  Recent Labs Lab 05/21/14 1230 05/22/14 0410 05/23/14 0435  NA 135 138 137  K 3.1* 3.5 3.5  CL 96 102 102  CO2 '31 29 25  ' GLUCOSE 120* 110* 129*  BUN 25* 26* 29*  CREATININE 2.24* 2.15* 2.00*  CALCIUM 9.0 8.4 9.0   Liver Function Tests:  Recent Labs Lab 05/21/14 1230 05/22/14 0410  AST 26 51*  ALT 21 48  ALKPHOS 70 88  BILITOT 0.8 0.9  PROT 8.7* 6.9  ALBUMIN 4.0 3.3*   No results for input(s): LIPASE, AMYLASE in the last 168 hours. No results for input(s): AMMONIA in the last 168 hours. CBC:  Recent Labs Lab 05/21/14 1230 05/22/14 0410  WBC 6.7 6.6  HGB 15.4 13.6  HCT 46.4 41.8  MCV 87.1 86.7  PLT 292 262   Cardiac Enzymes:  Recent Labs Lab 05/21/14 1607 05/21/14 2138 05/22/14 0410  TROPONINI <0.03 <0.03 0.03   BNP: Invalid input(s): POCBNP CBG: No results for input(s): GLUCAP in the last 168 hours.  No results found for this or any previous visit (from the past 240 hour(s)).   Scheduled Meds: . amLODipine  5 mg Oral Daily  . aspirin EC  81 mg Oral Daily  . atorvastatin  40 mg Oral q1800  . heparin  5,000 Units Subcutaneous 3 times per day  . metoprolol tartrate  50 mg Oral BID  .  morphine injection  4 mg Intravenous Once  . sodium chloride  3 mL Intravenous Q12H   Continuous Infusions:    Richard Scherzinger, DO  [facility] Pager [phone]  If 7PM-7AM, please contact night-coverage [email or web address] Password [facility] 05/23/2014, 6:17 PM

## 2014-05-23 NOTE — Progress Notes (Signed)
UR completed 

## 2014-05-23 NOTE — Progress Notes (Signed)
SUBJECTIVE:  Complains of headache  OBJECTIVE:   Vitals:   Filed Vitals:   05/22/14 2028 05/23/14 0207 05/23/14 0208 05/23/14 0419  BP: 182/107 184/122 185/116 140/92  Pulse: 87 91 79 90  Temp: 98.2 F (36.8 C)   97.8 F (36.6 C)  TempSrc: Oral   Oral  Resp: 16   16  Height:      Weight:      SpO2: 100%   96%   I&O's:   Intake/Output Summary (Last 24 hours) at 05/23/14 0754 Last data filed at 05/23/14 0515  Gross per 24 hour  Intake    716 ml  Output    725 ml  Net     -9 ml   TELEMETRY: Reviewed telemetry pt in NSR:     PHYSICAL EXAM General: Well developed, well nourished, in no acute distress Head: Eyes PERRLA, No xanthomas.   Normal cephalic and atramatic  Lungs:   Clear bilaterally to auscultation and percussion. Heart:   HRRR S1 S2 Pulses are 2+ & equal. Abdomen: Bowel sounds are positive, abdomen soft and non-tender without masses  Extremities:   No clubbing, cyanosis or edema.  DP +1 Neuro: Alert and oriented X 3. Psych:  Good affect, responds appropriately   LABS: Basic Metabolic Panel:  Recent Labs  47/82/95 0410 05/23/14 0435  NA 138 137  K 3.5 3.5  CL 102 102  CO2 29 25  GLUCOSE 110* 129*  BUN 26* 29*  CREATININE 2.15* 2.00*  CALCIUM 8.4 9.0   Liver Function Tests:  Recent Labs  05/21/14 1230 05/22/14 0410  AST 26 51*  ALT 21 48  ALKPHOS 70 88  BILITOT 0.8 0.9  PROT 8.7* 6.9  ALBUMIN 4.0 3.3*   No results for input(s): LIPASE, AMYLASE in the last 72 hours. CBC:  Recent Labs  05/21/14 1230 05/22/14 0410  WBC 6.7 6.6  HGB 15.4 13.6  HCT 46.4 41.8  MCV 87.1 86.7  PLT 292 262   Cardiac Enzymes:  Recent Labs  05/21/14 1607 05/21/14 2138 05/22/14 0410  TROPONINI <0.03 <0.03 0.03   BNP: Invalid input(s): POCBNP D-Dimer: No results for input(s): DDIMER in the last 72 hours. Hemoglobin A1C:  Recent Labs  05/22/14 0410  HGBA1C 6.4*   Fasting Lipid Panel:  Recent Labs  05/22/14 0410  CHOL 275*  HDL 37*    LDLCALC 203*  TRIG 173*  CHOLHDL 7.4   Thyroid Function Tests: No results for input(s): TSH, T4TOTAL, T3FREE, THYROIDAB in the last 72 hours.  Invalid input(s): FREET3 Anemia Panel: No results for input(s): VITAMINB12, FOLATE, FERRITIN, TIBC, IRON, RETICCTPCT in the last 72 hours. Coag Panel:   No results found for: INR, PROTIME  RADIOLOGY: Dg Chest 2 View  05/21/2014   CLINICAL DATA:  Difficulty breathing and hypertension. Right lower extremity edema  EXAM: CHEST  2 VIEW  COMPARISON:  None.  FINDINGS: There is no edema or consolidation. Heart size and pulmonary vascularity are normal. No adenopathy. There is mid thoracic levoscoliosis.  IMPRESSION: No edema or consolidation.   Electronically Signed   By: Bretta Bang III M.D.   On: 05/21/2014 13:06   Ct Head Wo Contrast  05/21/2014   CLINICAL DATA:  Headache and altered mental status.  Hypertension  EXAM: CT HEAD WITHOUT CONTRAST  TECHNIQUE: Contiguous axial images were obtained from the base of the skull through the vertex without intravenous contrast.  COMPARISON:  None.  FINDINGS: Ventricle size is normal.  Cerebral volume is normal.  Hypodensity present in the frontal and parietal lobes bilaterally left greater than right. Hypodensity extends into the centrum semiovale and internal capsule left. These findings are most consistent with chronic microvascular ischemia  Negative for acute infarct.  Negative for hemorrhage or mass.  Calvarium intact.  IMPRESSION: Moderate white matter disease bilaterally with the appearance of chronic microvascular ischemia. No acute abnormality   Electronically Signed   By: Marlan Palau M.D.   On: 05/21/2014 13:47   ASSESSMENT AND PLAN  1. Chest pain; atypical, however high risk patient considering prior stenting, medication non-compliance, poorly controlled HTN and significant family h/o CAD -2D echocardiogram showed normal LVEF with no regional wall motion abnormalities, but moderate concentric LVH,  abnormal relaxation and moderate pulmonary hypertension.  Cardiac enzymes are negative x 3.    - the mainstay of therapy would be aggressive management of hypertension and CAD, including aspirin and atorvastatin, no need for plavix as his stent was placed 3 years ago and he is concerned about financial burden  - considering his CKD stage III (? Acute vs chronic) will schedule a lexiscan nuclear stress test today  2. Hypertension, poorly controlled, sec to medication non-complaince, moderate concentric LVH - Amlodipine and metoprolol restarted but remains elevated.  Will increase metoprolol to 50mg  BID  3. Moderate pulmonary HTN - pulmonary embolism and chronic thromboembolic disease should be considered.  Will check Chest CT angio to rule out PE.   Amlodipine is a good antihypertensive choice  4. Hypokalemia - replete per IM  5. Hyperlipidemia - agree with atorvastatin, but increase to 40 mg po daily due to elevated LDL, follow LFTs as AST is mildly elevated  6. LE edema - unilateral - no DVT by doppler   Richard Reichert, MD  05/23/2014  7:54 AM

## 2014-05-23 NOTE — Progress Notes (Signed)
Patient has continued with elevated blood pressures throughout the night.  Medicated with prn Hydralazine as well as a one time dose of 10mg  Hydralazine per NP orders.  Patient also continues to complain of headache.  Will continue to monitor patient.

## 2014-05-24 ENCOUNTER — Other Ambulatory Visit: Payer: Self-pay

## 2014-05-24 ENCOUNTER — Ambulatory Visit (HOSPITAL_COMMUNITY)
Admit: 2014-05-24 | Discharge: 2014-05-24 | Disposition: A | Discharge status: Home / Self Care | Payer: Medicaid Other | Attending: Cardiology | Admitting: Cardiology

## 2014-05-24 DIAGNOSIS — N183 Chronic kidney disease, stage 3 (moderate): Secondary | ICD-10-CM

## 2014-05-24 DIAGNOSIS — R079 Chest pain, unspecified: Secondary | ICD-10-CM

## 2014-05-24 LAB — BASIC METABOLIC PANEL
Anion gap: 10 (ref 5–15)
BUN: 34 mg/dL — ABNORMAL HIGH (ref 6–23)
CALCIUM: 9 mg/dL (ref 8.4–10.5)
CO2: 26 mmol/L (ref 19–32)
CREATININE: 2.35 mg/dL — AB (ref 0.50–1.35)
Chloride: 104 mmol/L (ref 96–112)
GFR calc Af Amer: 33 mL/min — ABNORMAL LOW (ref >90)
GFR calc non Af Amer: 29 mL/min — ABNORMAL LOW (ref >90)
GLUCOSE: 120 mg/dL — AB (ref 70–99)
Potassium: 4 mmol/L (ref 3.5–5.1)
SODIUM: 140 mmol/L (ref 135–145)

## 2014-05-24 MED ORDER — TECHNETIUM TC 99M SESTAMIBI GENERIC - CARDIOLITE
30.0000 | Freq: Once | INTRAVENOUS | Status: AC | PRN
  Administered 2014-05-24: 30 via INTRAVENOUS

## 2014-05-24 MED ORDER — AMLODIPINE BESYLATE 10 MG PO TABS: 10.0000 mg | ORAL_TABLET | Freq: Every day | ORAL | Status: DC

## 2014-05-24 MED ORDER — METOPROLOL TARTRATE 50 MG PO TABS: 50.0000 mg | ORAL_TABLET | Freq: Two times a day (BID) | ORAL | Status: DC

## 2014-05-24 MED ORDER — REGADENOSON 0.4 MG/5ML IV SOLN
INTRAVENOUS | Status: AC
  Administered 2014-05-24: 0.4 mg via INTRAVENOUS
  Filled 2014-05-24: qty 5

## 2014-05-24 MED ORDER — SODIUM CHLORIDE 0.9 % IV BOLUS (SEPSIS)
500.0000 mL | Freq: Once | INTRAVENOUS | Status: AC
  Administered 2014-05-24: 500 mL via INTRAVENOUS

## 2014-05-24 MED ORDER — TECHNETIUM TC 99M SESTAMIBI GENERIC - CARDIOLITE
10.0000 | Freq: Once | INTRAVENOUS | Status: AC | PRN
  Administered 2014-05-24: 10 via INTRAVENOUS

## 2014-05-24 MED ORDER — REGADENOSON 0.4 MG/5ML IV SOLN
0.4000 mg | Freq: Once | INTRAVENOUS | Status: AC
  Administered 2014-05-24: 0.4 mg via INTRAVENOUS

## 2014-05-24 MED ORDER — ATORVASTATIN CALCIUM 40 MG PO TABS: 40.0000 mg | ORAL_TABLET | Freq: Every day | ORAL | Status: DC

## 2014-05-24 MED ORDER — HYDRALAZINE HCL 20 MG/ML IJ SOLN: 10.0000 mg | Freq: Four times a day (QID) | INTRAMUSCULAR | Status: DC | PRN

## 2014-05-24 MED ORDER — ASPIRIN 81 MG PO TBEC: 81.0000 mg | DELAYED_RELEASE_TABLET | Freq: Every day | ORAL | Status: DC

## 2014-05-24 NOTE — Progress Notes (Signed)
UR completed 

## 2014-05-24 NOTE — Progress Notes (Signed)
Patient's son in law came to transport patient by car to the patient's residence. PIV was removed prior to discharge. Patient was educated and discharge instructions were given to patient.

## 2014-05-24 NOTE — Discharge Summary (Addendum)
Physician Discharge Summary  Richard Barnett HYI:[id number] DOB: 02-16-1956 DOA: 05/21/2014  PCP: No primary care provider on file.  Admit date: 05/21/2014 Discharge date: 05/24/2014  Recommendations for Outpatient Follow-up:  1. Pt will need to follow up with PCP in 2 weeks post discharge 2. Please obtain BMP in 1-2 weeks Discharge Diagnoses:  Atypical chest pain atypical -EKG with T-wave inversion in lead III, aVF and V5, V6 -Concerned that the patient has not been taking his aspirin and Plavix as prescribed -Appreciate cardiology--lexisan 3/18 -case discussed with Dr. Kara Pacer clinical suspicion for PE, defer V/Q (renal failure prevents CTA) -Restart aspirin -Echocardiogram--EF 78-67%, grade 1 diastolic dysfunction, no WMA -Troponins negative 3 -Lexiscan was low risk--neg for reversible ischemia, EF 56% Hypertension, poorly controlled -Restart amlodipine--increased dose to 44m daily -Start metoprolol tartrate--increase to 528mbid -Patient had not taken his medications for nearly a month prior to his admission. -Discontinue HCTZ due to the patient's CKD CKD  -likely CKD stage 3 -Unclear renal baseline -Judicious IV fluids given -Monitor BMP-->creatinine remained stable during the hospitalization Hypokalemia -Repleted CAD with stent -Unclear medical history -Per patient and daughter the patient has had PTC 3 yrs ago -plavix discontinued -continue ASA Hyperlipidemia -LDL 203 -Start statin-->lipitor 4071maily  Discharge Condition: stable  Disposition: home Follow-up Information    Follow up with [facility]:  [facility] go [facility] to have follow up after leaving the emergency room PLEASE CALL [phone]:   [address] [place] [phone]   Diet:heart healthy Wt Readings from Last 3 Encounters:    05/21/14 99.519 kg (219 lb 6.4 oz)    History of present illness:  [age]ar old Richard Barnett with history of coronary artery disease, hypertension presented with one-day history of chest discomfort. The patient is a very poor historian. He recently moved from [place] be with his daughter approximately 3 weeks ago. Apparently, the patient had coronary stents placed less than 1 year ago. He is unable to tell me the exact length of time. The patient is poorly compliant with his medications. He was previously taken aspirin and Plavix as well as some antihypertensive medications. He only takes his meds "once in a blue moon". Cardiology was consulted. Troponin was negative 3. Cardiology recommended a Lexiscan stress test. The scan was negative with EF 56%. The patient was discharged in stable condition. His blood pressure was optimized with increasing his amlodipine and start metoprolol tartrate. The patient was also started on Lipitor. Arrangements were made for the patient to follow up [facility]. Please note that the patient is not able to read.   Consultants: cardiology  Discharge Exam: Filed Vitals:   05/24/14 1321  BP: 164/81  Pulse: 69  Temp: 98 F (36.7 C)  Resp: 18   Filed Vitals:   05/24/14 1112 05/24/14 1148 05/24/14 1155 05/24/14 1321  BP: 173/95 179/110 179/110 164/81  Pulse:   72 69  Temp: 98 F (36.7 C)   98 F (36.7 C)  TempSrc: Oral   Oral  Resp: 18   18  Height:      Weight:      SpO2: 100% 100%  100%   General: A&O x 3, NAD, pleasant, cooperative Cardiovascular: RRR, no rub, no gallop, no S3 Respiratory: CTAB, no wheeze, no rhonchi Abdomen:soft, nontender,  nondistended, positive bowel sounds Extremities: No edema, No lymphangitis, no petechiae  Discharge Instructions      Discharge Instructions    Diet - low sodium heart healthy    Complete by:  As directed      Increase activity slowly    Complete by:  As directed              Medication List    STOP taking these medications        hydrochlorothiazide 25 MG tablet  Commonly known as:  HYDRODIURIL      TAKE these medications        amLODipine 10 MG tablet  Commonly known as:  NORVASC  Take 1 tablet (10 mg total) by mouth daily.  Start taking on:  05/25/2014     aspirin 81 MG EC tablet  Take 1 tablet (81 mg total) by mouth daily.     atorvastatin 40 MG tablet  Commonly known as:  LIPITOR  Take 1 tablet (40 mg total) by mouth daily at 6 PM.     metoprolol 50 MG tablet  Commonly known as:  LOPRESSOR  Take 1 tablet (50 mg total) by mouth 2 (two) times daily.         The results of significant diagnostics from this hospitalization (including imaging, microbiology, ancillary and laboratory) are listed below for reference.    Significant Diagnostic Studies: Dg Chest 2 View  05/21/2014   CLINICAL DATA:  Difficulty breathing and hypertension. Right lower extremity edema  EXAM: CHEST  2 VIEW  COMPARISON:  None.  FINDINGS: There is no edema or consolidation. Heart size and pulmonary vascularity are normal. No adenopathy. There is mid thoracic levoscoliosis.  IMPRESSION: No edema or consolidation.   Electronically Signed   By: Lowella Grip III M.D.   On: 05/21/2014 13:06   Ct Head Wo Contrast  05/21/2014   CLINICAL DATA:  Headache and altered mental status.  Hypertension  EXAM: CT HEAD WITHOUT CONTRAST  TECHNIQUE: Contiguous axial images were obtained from the base of the skull through the vertex without intravenous contrast.  COMPARISON:  None.  FINDINGS: Ventricle size is normal.  Cerebral volume is normal.  Hypodensity present in the frontal and parietal lobes bilaterally left greater than right. Hypodensity extends into the centrum semiovale and internal capsule left. These findings are most consistent with chronic microvascular ischemia  Negative for acute infarct.  Negative for hemorrhage or mass.  Calvarium intact.  IMPRESSION: Moderate white  matter disease bilaterally with the appearance of chronic microvascular ischemia. No acute abnormality   Electronically Signed   By: Franchot Gallo M.D.   On: 05/21/2014 13:47   Nm Myocar Multi W/spect W/wall Motion / Ef  05/24/2014   CLINICAL DATA:  59 year old Azari with chest pain. Past medical history includes prior myocardial infarction.  EXAM: MYOCARDIAL IMAGING WITH SPECT (REST AND PHARMACOLOGIC-STRESS)  GATED LEFT VENTRICULAR WALL MOTION STUDY  LEFT VENTRICULAR EJECTION FRACTION  TECHNIQUE: Standard myocardial SPECT imaging was performed after resting intravenous injection of 10 mCi Tc-75msestamibi. Subsequently, intravenous infusion of Lexiscan was performed under the supervision of the Cardiology staff. At peak effect of the drug, 30 mCi Tc-951mestamibi was injected intravenously and standard myocardial SPECT imaging was performed. Quantitative gated imaging was also performed to evaluate left ventricular wall motion, and estimate left ventricular ejection fraction.  COMPARISON:  Prior chest x-ray 05/21/2014  FINDINGS: Perfusion: No decreased activity in the left ventricle on stress imaging to suggest reversible ischemia or infarction.  Wall Motion: Normal left ventricular wall motion. No left ventricular dilation.  Left Ventricular Ejection Fraction: 56 %  End diastolic volume 761 ml  End systolic volume 49 ml  IMPRESSION: 1. No reversible ischemia or infarction.  2. Normal left ventricular wall motion.  3. Left ventricular ejection fraction 56%  4. Low-risk stress test findings*.  *2012 Appropriate Use Criteria for Coronary Revascularization Focused Update: J Am Coll Cardiol. 5183;43(7):357-897. http://content.airportbarriers.com.aspx?articleid=1201161   Electronically Signed   By: Jacqulynn Cadet M.D.   On: 05/24/2014 16:24     Microbiology: No results found for this or any previous visit (from the past 240 hour(s)).   Labs: Basic Metabolic Panel:  Recent Labs Lab 05/21/14 1230  05/22/14 0410 05/23/14 0435 05/24/14 0545  NA 135 138 137 140  K 3.1* 3.5 3.5 4.0  CL 96 102 102 104  CO2 '31 29 25 26  ' GLUCOSE 120* 110* 129* 120*  BUN 25* 26* 29* 34*  CREATININE 2.24* 2.15* 2.00* 2.35*  CALCIUM 9.0 8.4 9.0 9.0   Liver Function Tests:  Recent Labs Lab 05/21/14 1230 05/22/14 0410  AST 26 51*  ALT 21 48  ALKPHOS 70 88  BILITOT 0.8 0.9  PROT 8.7* 6.9  ALBUMIN 4.0 3.3*   No results for input(s): LIPASE, AMYLASE in the last 168 hours. No results for input(s): AMMONIA in the last 168 hours. CBC:  Recent Labs Lab 05/21/14 1230 05/22/14 0410  WBC 6.7 6.6  HGB 15.4 13.6  HCT 46.4 41.8  MCV 87.1 86.7  PLT 292 262   Cardiac Enzymes:  Recent Labs Lab 05/21/14 1607 05/21/14 2138 05/22/14 0410  TROPONINI <0.03 <0.03 0.03   BNP: Invalid input(s): POCBNP CBG: No results for input(s): GLUCAP in the last 168 hours.  Time coordinating discharge:  Greater than 30 minutes  Signed:  Jazmin Vensel, DO [facility] Pager: [phone] 05/24/2014, 5:43 PM

## 2014-05-24 NOTE — Progress Notes (Deleted)
SUBJECTIVE:  Complains of headache, no chest pain  OBJECTIVE:   Vitals:   Filed Vitals:   05/23/14 0419 05/23/14 1300 05/23/14 2103 05/24/14 0519  BP: 140/92 149/84 157/93 156/97  Pulse: 90 88 86 79  Temp: 97.8 F (36.6 C) 98.1 F (36.7 C) 97.8 F (36.6 C) 98.1 F (36.7 C)  TempSrc: Oral Oral Oral Oral  Resp: 16 18 16 16   Height:      Weight:      SpO2: 96% 100% 96% 95%   I&O's:    Intake/Output Summary (Last 24 hours) at 05/24/14 0942 Last data filed at 05/23/14 1700  Gross per 24 hour  Intake    720 ml  Output      0 ml  Net    720 ml   TELEMETRY: Reviewed telemetry pt in NSR:     PHYSICAL EXAM General: Well developed, well nourished, in no acute distress Head: Eyes PERRLA, No xanthomas.   Normal cephalic and atramatic  Lungs:   Clear bilaterally to auscultation and percussion. Heart:   HRRR S1 S2 Pulses are 2+ & equal. Abdomen: Bowel sounds are positive, abdomen soft and non-tender without masses  Extremities:   No clubbing, cyanosis or edema.  DP +1 Neuro: Alert and oriented X 3. Psych:  Good affect, responds appropriately   LABS: Basic Metabolic Panel:  Recent Labs  09/81/19 0435 05/24/14 0545  NA 137 140  K 3.5 4.0  CL 102 104  CO2 25 26  GLUCOSE 129* 120*  BUN 29* 34*  CREATININE 2.00* 2.35*  CALCIUM 9.0 9.0   Liver Function Tests:  Recent Labs  05/21/14 1230 05/22/14 0410  AST 26 51*  ALT 21 48  ALKPHOS 70 88  BILITOT 0.8 0.9  PROT 8.7* 6.9  ALBUMIN 4.0 3.3*   No results for input(s): LIPASE, AMYLASE in the last 72 hours. CBC:  Recent Labs  05/21/14 1230 05/22/14 0410  WBC 6.7 6.6  HGB 15.4 13.6  HCT 46.4 41.8  MCV 87.1 86.7  PLT 292 262   Cardiac Enzymes:  Recent Labs  05/21/14 1607 05/21/14 2138 05/22/14 0410  TROPONINI <0.03 <0.03 0.03   BNP: Invalid input(s): POCBNP D-Dimer: No results for input(s): DDIMER in the last 72 hours. Hemoglobin A1C:  Recent Labs  05/22/14 0410  HGBA1C 6.4*   Fasting  Lipid Panel:  Recent Labs  05/22/14 0410  CHOL 275*  HDL 37*  LDLCALC 203*  TRIG 173*  CHOLHDL 7.4   Thyroid Function Tests: No results for input(s): TSH, T4TOTAL, T3FREE, THYROIDAB in the last 72 hours.  Invalid input(s): FREET3 Anemia Panel: No results for input(s): VITAMINB12, FOLATE, FERRITIN, TIBC, IRON, RETICCTPCT in the last 72 hours. Coag Panel:   No results found for: INR, PROTIME  RADIOLOGY: Dg Chest 2 View  05/21/2014   CLINICAL DATA:  Difficulty breathing and hypertension. Right lower extremity edema  EXAM: CHEST  2 VIEW  COMPARISON:  None.  FINDINGS: There is no edema or consolidation. Heart size and pulmonary vascularity are normal. No adenopathy. There is mid thoracic levoscoliosis.  IMPRESSION: No edema or consolidation.   Electronically Signed   By: Bretta Bang III M.D.   On: 05/21/2014 13:06   Ct Head Wo Contrast  05/21/2014   CLINICAL DATA:  Headache and altered mental status.  Hypertension  EXAM: CT HEAD WITHOUT CONTRAST  TECHNIQUE: Contiguous axial images were obtained from the base of the skull through the vertex without intravenous contrast.  COMPARISON:  None.  FINDINGS: Ventricle size is normal.  Cerebral volume is normal.  Hypodensity present in the frontal and parietal lobes bilaterally left greater than right. Hypodensity extends into the centrum semiovale and internal capsule left. These findings are most consistent with chronic microvascular ischemia  Negative for acute infarct.  Negative for hemorrhage or mass.  Calvarium intact.  IMPRESSION: Moderate white matter disease bilaterally with the appearance of chronic microvascular ischemia. No acute abnormality   Electronically Signed   By: Marlan Palau M.D.   On: 05/21/2014 13:47   ASSESSMENT AND PLAN  1. Chest pain; atypical, however high risk patient considering prior stenting, medication non-compliance, poorly controlled HTN and significant family h/o CAD -2D echocardiogram showed normal LVEF with  no regional wall motion abnormalities, but moderate concentric LVH, abnormal relaxation and moderate pulmonary hypertension.  Cardiac enzymes are negative x 3.    - the mainstay of therapy would be aggressive management of hypertension and CAD, including aspirin and atorvastatin, no need for plavix as his stent was placed 3 years ago and he is concerned about financial burden  - considering his CKD stage III (? Acute vs chronic) will schedule a lexiscan nuclear stress test today  2. Hypertension, poorly controlled, sec to medication non-complaince, moderate concentric LVH - Amlodipine and metoprolol restarted but remains elevated.  Will increase metoprolol to  BID  3. Moderate pulmonary HTN - pulmonary embolism and chronic thromboembolic disease should be considered.  Will check Chest CT angio to rule out PE.   Amlodipine is a good antihypertensive choice  4. Hypokalemia - replete per IM  5. Hyperlipidemia - agree with atorvastatin, but increase to 40 mg po daily due to elevated LDL, follow LFTs as AST is mildly elevated  6. LE edema - unilateral - no DVT by doppler  7. Renal insufficiency- stage 3B  Plan:  Lexiscan Myoview done this am. Results will be out later today. Hydralazine prn for HTN.   Abelino Derrick, PA-C  05/24/2014  9:42 AM

## 2014-05-28 ENCOUNTER — Emergency Department (HOSPITAL_COMMUNITY)
Admission: EM | Admit: 2014-05-28 | Discharge: 2014-05-28 | Discharge status: Against Medical Advice | Payer: Medicaid Other | Attending: Emergency Medicine | Admitting: Emergency Medicine

## 2014-05-28 ENCOUNTER — Emergency Department (HOSPITAL_COMMUNITY)
Admission: EM | Admit: 2014-05-28 | Discharge: 2014-05-28 | Disposition: A | Discharge status: Home / Self Care | Payer: Medicaid Other | Source: Home / Self Care

## 2014-05-28 ENCOUNTER — Encounter (HOSPITAL_COMMUNITY): Payer: Self-pay | Admitting: Neurology

## 2014-05-28 ENCOUNTER — Other Ambulatory Visit: Payer: Self-pay

## 2014-05-28 DIAGNOSIS — Z87891 Personal history of nicotine dependence: Secondary | ICD-10-CM | POA: Diagnosis not present

## 2014-05-28 DIAGNOSIS — Z760 Encounter for issue of repeat prescription: Secondary | ICD-10-CM

## 2014-05-28 DIAGNOSIS — I159 Secondary hypertension, unspecified: Secondary | ICD-10-CM

## 2014-05-28 DIAGNOSIS — I10 Essential (primary) hypertension: Secondary | ICD-10-CM | POA: Diagnosis present

## 2014-05-28 DIAGNOSIS — I252 Old myocardial infarction: Secondary | ICD-10-CM | POA: Diagnosis not present

## 2014-05-28 MED ORDER — METOPROLOL TARTRATE 50 MG PO TABS: 50.0000 mg | ORAL_TABLET | Freq: Two times a day (BID) | ORAL | Status: DC

## 2014-05-28 MED ORDER — LOVASTATIN 20 MG PO TABS: 20.0000 mg | ORAL_TABLET | Freq: Every day | ORAL | Status: DC

## 2014-05-28 MED ORDER — LISINOPRIL 10 MG PO TABS: 10.0000 mg | ORAL_TABLET | Freq: Every day | ORAL | Status: DC

## 2014-05-28 MED ORDER — ASPIRIN 81 MG PO TBEC: 81.0000 mg | DELAYED_RELEASE_TABLET | Freq: Every day | ORAL | Status: DC

## 2014-05-28 NOTE — ED Notes (Signed)
Dr. Patria Mane up to triage to talk with patient. Discharging pt from triage.

## 2014-05-28 NOTE — ED Notes (Signed)
PT REFUSING BLOOD WORK.

## 2014-05-28 NOTE — Discharge Instructions (Signed)
Medication Refill, Emergency Department °We have refilled your medication today as a courtesy to you. It is best for your medical care, however, to take care of getting refills done through your primary caregiver's office. They have your records and can do a better job of follow-up than we can in the emergency department. °On maintenance medications, we often only prescribe enough medications to get you by until you are able to see your regular caregiver. This is a more expensive way to refill medications. °In the future, please plan for refills so that you will not have to use the emergency department for this. °Thank you for your help. Your help allows us to better take care of the daily emergencies that enter our department. °Document Released: 06/11/2003 Document Revised: 05/17/2011 Document Reviewed: 06/01/2013 °[employer]® Patient Information ©2015 [employer]. This information is not intended to replace advice given to you by your health care provider. Make sure you discuss any questions you have with your health care provider. ° °

## 2014-05-28 NOTE — ED Notes (Signed)
Pt reports hypertension; hasn't been taking BP meds b/c he ran out. Went to [facility] and they wanted $3 but couldn't pay. Needs help getting meds due to no money. Reports CP. Is irritable and uncooperative.

## 2014-05-29 ENCOUNTER — Inpatient Hospital Stay: Payer: Medicaid Other

## 2014-07-22 ENCOUNTER — Encounter (HOSPITAL_COMMUNITY): Payer: Self-pay

## 2014-07-22 ENCOUNTER — Emergency Department (HOSPITAL_COMMUNITY)
Admission: EM | Admit: 2014-07-22 | Discharge: 2014-07-22 | Disposition: A | Discharge status: Home / Self Care | Payer: Medicaid Other | Source: Home / Self Care | Attending: Family Medicine | Admitting: Family Medicine

## 2014-07-22 DIAGNOSIS — R030 Elevated blood-pressure reading, without diagnosis of hypertension: Secondary | ICD-10-CM

## 2014-07-22 DIAGNOSIS — G8929 Other chronic pain: Secondary | ICD-10-CM | POA: Diagnosis not present

## 2014-07-22 DIAGNOSIS — K089 Disorder of teeth and supporting structures, unspecified: Secondary | ICD-10-CM | POA: Diagnosis not present

## 2014-07-22 DIAGNOSIS — M25561 Pain in right knee: Secondary | ICD-10-CM

## 2014-07-22 DIAGNOSIS — IMO0001 Reserved for inherently not codable concepts without codable children: Secondary | ICD-10-CM

## 2014-07-22 MED ORDER — COLCHICINE 0.6 MG PO TABS: 0.6000 mg | ORAL_TABLET | Freq: Every day | ORAL | Status: DC

## 2014-07-22 MED ORDER — ACETAMINOPHEN 325 MG PO TABS
650.0000 mg | ORAL_TABLET | Freq: Once | ORAL | Status: AC
  Administered 2014-07-22: 650 mg via ORAL

## 2014-07-22 MED ORDER — LISINOPRIL 10 MG PO TABS: 10.0000 mg | ORAL_TABLET | Freq: Every day | ORAL | Status: DC

## 2014-07-22 MED ORDER — METOPROLOL TARTRATE 50 MG PO TABS: 50.0000 mg | ORAL_TABLET | Freq: Two times a day (BID) | ORAL | Status: DC

## 2014-07-22 MED ORDER — ACETAMINOPHEN 325 MG PO TABS
ORAL_TABLET | ORAL | Status: AC
  Filled 2014-07-22: qty 2

## 2014-07-22 NOTE — ED Notes (Signed)
C/o out of his BP medication for couple of months, knee pain & swelling x 3 months, toothache x 2 months. No regular MD

## 2014-07-22 NOTE — Discharge Instructions (Signed)
It is safe for you to use tylenol as directed on packaging for pain. Please use other medications as prescribed. You will need to contact the clinic listed on your discharge paperwork [facility]) to establish for primary care. Or you may contact the provider listed on your Medicaid Card. I suspect you have gout in your right knee.  Dental Pain A tooth ache may be caused by cavities (tooth decay). Cavities expose the nerve of the tooth to air and hot or cold temperatures. It may come from an infection or abscess (also called a boil or furuncle) around your tooth. It is also often caused by dental caries (tooth decay). This causes the pain you are having. DIAGNOSIS  Your caregiver can diagnose this problem by exam. TREATMENT   If caused by an infection, it may be treated with medications which kill germs (antibiotics) and pain medications as prescribed by your caregiver. Take medications as directed.  Only take over-the-counter or prescription medicines for pain, discomfort, or fever as directed by your caregiver.  Whether the tooth ache today is caused by infection or dental disease, you should see your dentist as soon as possible for further care. SEEK MEDICAL CARE IF: The exam and treatment you received today has been provided on an emergency basis only. This is not a substitute for complete medical or dental care. If your problem worsens or new problems (symptoms) appear, and you are unable to meet with your dentist, call or return to this location. SEEK IMMEDIATE MEDICAL CARE IF:   You have a fever.  You develop redness and swelling of your face, jaw, or neck.  You are unable to open your mouth.  You have severe pain uncontrolled by pain medicine. MAKE SURE YOU:   Understand these instructions.  Will watch your condition.  Will get help right away if you are not doing well or get worse. Document Released: 02/22/2005 Document Revised: 05/17/2011 Document  Reviewed: 10/11/2007 [facility] Patient Information 2015 [place], [place]. This information is not intended to replace advice given to you by your health care provider. Make sure you discuss any questions you have with your health care provider.  Dental Care and Dentist Visits Dental care supports good overall health. Regular dental visits can also help you avoid dental pain, bleeding, infection, and other more serious health problems in the future. It is important to keep the mouth healthy because diseases in the teeth, gums, and other oral tissues can spread to other areas of the body. Some problems, such as diabetes, heart disease, and pre-term labor have been associated with poor oral health.  See your dentist every 6 months. If you experience emergency problems such as a toothache or broken tooth, go to the dentist right away. If you see your dentist regularly, you may catch problems early. It is easier to be treated for problems in the early stages.  WHAT TO EXPECT AT A DENTIST VISIT  Your dentist will look for many common oral health problems and recommend proper treatment. At your regular dental visit, you can expect:  Gentle cleaning of the teeth and gums. This includes scraping and polishing. This helps to remove the sticky substance around the teeth and gums (plaque). Plaque forms in the mouth shortly after eating. Over time, plaque hardens on the teeth as tartar. If tartar is not removed regularly, it can cause problems. Cleaning also helps remove stains.  Periodic X-rays. These pictures of the teeth and supporting bone will help your dentist  assess the health of your teeth.  Periodic fluoride treatments. Fluoride is a natural mineral shown to help strengthen teeth. Fluoride treatmentinvolves applying a fluoride gel or varnish to the teeth. It is most commonly done in children.  Examination of the mouth, tongue, jaws, teeth, and gums to look for any oral health problems, such as:  Cavities  (dental caries). This is decay on the tooth caused by plaque, sugar, and acid in the mouth. It is best to catch a cavity when it is small.  Inflammation of the gums caused by plaque buildup (gingivitis).  Problems with the mouth or malformed or misaligned teeth.  Oral cancer or other diseases of the soft tissues or jaws. KEEP YOUR TEETH AND GUMS HEALTHY For healthy teeth and gums, follow these general guidelines as well as your dentist's specific advice:  Have your teeth professionally cleaned at the dentist every 6 months.  Brush twice daily with a fluoride toothpaste.  Floss your teeth daily.  Ask your dentist if you need fluoride supplements, treatments, or fluoride toothpaste.  Eat a healthy diet. Reduce foods and drinks with added sugar.  Avoid smoking. TREATMENT FOR ORAL HEALTH PROBLEMS If you have oral health problems, treatment varies depending on the conditions present in your teeth and gums.  Your caregiver will most likely recommend good oral hygiene at each visit.  For cavities, gingivitis, or other oral health disease, your caregiver will perform a procedure to treat the problem. This is typically done at a separate appointment. Sometimes your caregiver will refer you to another dental specialist for specific tooth problems or for surgery. SEEK IMMEDIATE DENTAL CARE IF:  You have pain, bleeding, or soreness in the gum, tooth, jaw, or mouth area.  A permanent tooth becomes loose or separated from the gum socket.  You experience a blow or injury to the mouth or jaw area. Document Released: 11/04/2010 Document Revised: 05/17/2011 Document Reviewed: 11/04/2010 [facility] Patient Information 2015 [place], [place]. This information is not intended to replace advice given to you by your health care provider. Make sure you discuss any questions you have with your health care provider.  Hypertension Hypertension, commonly called high blood pressure, is when the force of blood  pumping through your arteries is too strong. Your arteries are the blood vessels that carry blood from your heart throughout your body. A blood pressure reading consists of a higher number over a lower number, such as 110/72. The higher number (systolic) is the pressure inside your arteries when your heart pumps. The lower number (diastolic) is the pressure inside your arteries when your heart relaxes. Ideally you want your blood pressure below 120/80. Hypertension forces your heart to work harder to pump blood. Your arteries may become narrow or stiff. Having hypertension puts you at risk for heart disease, stroke, and other problems.  RISK FACTORS Some risk factors for high blood pressure are controllable. Others are not.  Risk factors you cannot control include:   Race. You may be at higher risk if you are African American.  Age. Risk increases with age.  Gender. Men are at higher risk than women before age 45 years. After age 52, women are at higher risk than men. Risk factors you can control include:  Not getting enough exercise or physical activity.  Being overweight.  Getting too much fat, sugar, calories, or salt in your diet.  Drinking too much alcohol. SIGNS AND SYMPTOMS Hypertension does not usually cause signs or symptoms. Extremely high blood pressure (hypertensive crisis) may  cause headache, anxiety, shortness of breath, and nosebleed. DIAGNOSIS  To check if you have hypertension, your health care provider will measure your blood pressure while you are seated, with your arm held at the level of your heart. It should be measured at least twice using the same arm. Certain conditions can cause a difference in blood pressure between your right and left arms. A blood pressure reading that is higher than normal on one occasion does not mean that you need treatment. If one blood pressure reading is high, ask your health care provider about having it checked again. TREATMENT  Treating  high blood pressure includes making lifestyle changes and possibly taking medicine. Living a healthy lifestyle can help lower high blood pressure. You may need to change some of your habits. Lifestyle changes may include:  Following the DASH diet. This diet is high in fruits, vegetables, and whole grains. It is low in salt, red meat, and added sugars.  Getting at least 2 hours of brisk physical activity every week.  Losing weight if necessary.  Not smoking.  Limiting alcoholic beverages.  Learning ways to reduce stress. If lifestyle changes are not enough to get your blood pressure under control, your health care provider may prescribe medicine. You may need to take more than one. Work closely with your health care provider to understand the risks and benefits. HOME CARE INSTRUCTIONS  Have your blood pressure rechecked as directed by your health care provider.   Take medicines only as directed by your health care provider. Follow the directions carefully. Blood pressure medicines must be taken as prescribed. The medicine does not work as well when you skip doses. Skipping doses also puts you at risk for problems.   Do not smoke.   Monitor your blood pressure at home as directed by your health care provider. SEEK MEDICAL CARE IF:   You think you are having a reaction to medicines taken.  You have recurrent headaches or feel dizzy.  You have swelling in your ankles.  You have trouble with your vision. SEEK IMMEDIATE MEDICAL CARE IF:  You develop a severe headache or confusion.  You have unusual weakness, numbness, or feel faint.  You have severe chest or abdominal pain.  You vomit repeatedly.  You have trouble breathing. MAKE SURE YOU:   Understand these instructions.  Will watch your condition.  Will get help right away if you are not doing well or get worse. Document Released: 02/22/2005 Document Revised: 07/09/2013 Document Reviewed: 12/15/2012 [facility]  Patient Information 2015 [place], [place]. This information is not intended to replace advice given to you by your health care provider. Make sure you discuss any questions you have with your health care provider.

## 2014-07-22 NOTE — ED Provider Notes (Signed)
CSN: [id number]     Arrival date & time 07/22/14  1045 History   First MD Initiated Contact with Patient 07/22/14 1333     Chief Complaint  Patient presents with  . Knee Pain  . Dental Pain   (Consider location/radiation/quality/duration/timing/severity/associated sxs/prior Treatment) HPI Comments: Patient presents requesting blood pressure medication refills, and medicine for several months of recurrent right knee pain and several months of dental pain. Review of records would indicate that patient has been referred to [facility] but has yet to establish care at this clinic.  Review of recent records would indicate recent admission for uncontrolled HTN and discharge diagnoses of HTN, hyperlipidemia, renal insufficiency and hyperglycemia.   Patient is a 59 y.o. Richard Barnett presenting with knee pain and tooth pain. The history is provided by the patient.  Knee Pain Dental Pain   Past Medical History  Diagnosis Date  . Hypertension   . MI (myocardial infarction)    Past Surgical History  Procedure Laterality Date  . Cardiac catheterization    . Coronary angioplasty with stent placement     History reviewed. No pertinent family history. History  Substance Use Topics  . Smoking status: Former Games developer  . Smokeless tobacco: Never Used  . Alcohol Use: No    Review of Systems  Allergies  Penicillins  Home Medications   Prior to Admission medications   Medication Sig Start Date End Date Taking? Authorizing Provider  aspirin 81 MG EC tablet Take 1 tablet (81 mg total) by mouth daily. 05/28/14   Azalia Bilis, MD  colchicine 0.6 MG tablet Take 1 tablet (0.6 mg total) by mouth daily. Take two tabs by mouth at once and then a third tablet one hour later 07/22/14   Mathis Fare Fredrick Dray, PA  lisinopril (PRINIVIL,ZESTRIL) 10 MG tablet Take 1 tablet (10 mg total) by mouth daily. 05/28/14   Azalia Bilis, MD  lisinopril (PRINIVIL,ZESTRIL) 10 MG tablet Take 1 tablet (10 mg total) by mouth daily. 07/22/14    Mathis Fare Rya Rausch, PA  lovastatin (MEVACOR) 20 MG tablet Take 1 tablet (20 mg total) by mouth at bedtime. 05/28/14   Azalia Bilis, MD  metoprolol (LOPRESSOR) 50 MG tablet Take 1 tablet (50 mg total) by mouth 2 (two) times daily. 05/28/14   Azalia Bilis, MD  metoprolol (LOPRESSOR) 50 MG tablet Take 1 tablet (50 mg total) by mouth 2 (two) times daily. 07/22/14   Jess Barters H Valary Manahan, PA   BP 184/101 mmHg  Pulse 74  Temp(Src) 98.1 F (36.7 C) (Oral)  Resp 16  SpO2 98% Physical Exam  ED Course  Procedures (including critical care time) Labs Review Labs Reviewed - No data to display  Imaging Review No results found.   MDM   1. Elevated blood pressure   2. Chronic dental pain   3. Knee pain, chronic, right   No NSAIDs used given patient's known renal insufficiency Suspect gout of right knee. Colchicine and tylenol as directed Limited refills of lopressor and lisinopril provided.  Printed list of low cost dental options provided to the patient.  Encouraged to seek follow up at [facility] or PCP office of his choice for additional refill and further medical management.     Ria Clock, [place] 07/22/14 [phone]

## 2014-07-22 NOTE — ED Notes (Signed)
Ice pack for knee pain

## 2014-07-24 ENCOUNTER — Ambulatory Visit: Payer: Medicaid Other | Attending: Family Medicine | Admitting: Family Medicine

## 2014-07-24 VITALS — BP 175/117 | HR 65 | Temp 97.9°F | Resp 16 | Wt 233.8 lb

## 2014-07-24 DIAGNOSIS — I10 Essential (primary) hypertension: Secondary | ICD-10-CM

## 2014-07-24 LAB — CBC WITH DIFFERENTIAL/PLATELET
Basophils Absolute: 0.1 10*3/uL (ref 0.0–0.1)
Basophils Relative: 1 % (ref 0–1)
EOS ABS: 0.2 10*3/uL (ref 0.0–0.7)
Eosinophils Relative: 4 % (ref 0–5)
HCT: 39.3 % (ref 39.0–52.0)
HEMOGLOBIN: 13.4 g/dL (ref 13.0–17.0)
LYMPHS ABS: 3.1 10*3/uL (ref 0.7–4.0)
Lymphocytes Relative: 53 % — ABNORMAL HIGH (ref 12–46)
MCH: 28.8 pg (ref 26.0–34.0)
MCHC: 34.1 g/dL (ref 30.0–36.0)
MCV: 84.5 fL (ref 78.0–100.0)
MONOS PCT: 10 % (ref 3–12)
MPV: 10.2 fL (ref 8.6–12.4)
Monocytes Absolute: 0.6 10*3/uL (ref 0.1–1.0)
Neutro Abs: 1.9 10*3/uL (ref 1.7–7.7)
Neutrophils Relative %: 32 % — ABNORMAL LOW (ref 43–77)
Platelets: 306 10*3/uL (ref 150–400)
RBC: 4.65 MIL/uL (ref 4.22–5.81)
RDW: 14.5 % (ref 11.5–15.5)
WBC: 5.8 10*3/uL (ref 4.0–10.5)

## 2014-07-24 LAB — COMPLETE METABOLIC PANEL WITH GFR
ALT: 20 U/L (ref 0–53)
AST: 20 U/L (ref 0–37)
Albumin: 3.6 g/dL (ref 3.5–5.2)
Alkaline Phosphatase: 75 U/L (ref 39–117)
BUN: 27 mg/dL — ABNORMAL HIGH (ref 6–23)
CALCIUM: 8.9 mg/dL (ref 8.4–10.5)
CO2: 25 mEq/L (ref 19–32)
Chloride: 105 mEq/L (ref 96–112)
Creat: 1.97 mg/dL — ABNORMAL HIGH (ref 0.50–1.35)
GFR, EST AFRICAN AMERICAN: 42 mL/min — AB
GFR, Est Non African American: 36 mL/min — ABNORMAL LOW
Glucose, Bld: 90 mg/dL (ref 70–99)
POTASSIUM: 4.9 meq/L (ref 3.5–5.3)
Sodium: 139 mEq/L (ref 135–145)
TOTAL PROTEIN: 6.9 g/dL (ref 6.0–8.3)
Total Bilirubin: 0.3 mg/dL (ref 0.2–1.2)

## 2014-07-24 LAB — LIPID PANEL
CHOL/HDL RATIO: 6.8 ratio
Cholesterol: 260 mg/dL — ABNORMAL HIGH (ref 0–200)
HDL: 38 mg/dL — AB (ref >=40)
LDL Cholesterol: 169 mg/dL — ABNORMAL HIGH (ref 0–99)
TRIGLYCERIDES: 266 mg/dL — AB (ref <150)
VLDL: 53 mg/dL — ABNORMAL HIGH (ref 0–40)

## 2014-07-24 MED ORDER — TRAMADOL HCL 50 MG PO TABS: 50.0000 mg | ORAL_TABLET | Freq: Three times a day (TID) | ORAL | Status: DC | PRN

## 2014-07-24 MED ORDER — CLONIDINE HCL 0.1 MG PO TABS
0.2000 mg | ORAL_TABLET | Freq: Every day | ORAL | Status: DC
  Administered 2014-07-24: 0.2 mg via ORAL

## 2014-07-24 MED ORDER — LISINOPRIL 10 MG PO TABS: 10.0000 mg | ORAL_TABLET | Freq: Every day | ORAL | Status: DC

## 2014-07-24 MED ORDER — METOPROLOL TARTRATE 50 MG PO TABS: 50.0000 mg | ORAL_TABLET | Freq: Two times a day (BID) | ORAL | Status: DC

## 2014-07-24 MED ORDER — LOVASTATIN 20 MG PO TABS: 20.0000 mg | ORAL_TABLET | Freq: Every day | ORAL | Status: DC

## 2014-07-24 NOTE — Patient Instructions (Signed)
May use Tramadol for severe knee pain. It will cause drowsiness Take BP medication every day as ordered. Come back in 10 days for BP check with nurse. Make an appointment with your assigned primry doctor here for one month.

## 2014-07-24 NOTE — Progress Notes (Signed)
Patient is here for follow up on right knee pain States he does have high blood pressure Patient will received clonidine 0.2mg  for bp

## 2014-07-24 NOTE — Progress Notes (Signed)
Patient ID: Richard Barnett, Richard   DOB: 02-08-1956, 59 y.o.   MRN: [id number]   Spence Barnett, is a 59 y.o. Tri  NHA:[id number]  OVA:[id number]  DOB - 07/01/1955  CC:  Chief Complaint  Patient presents with  . Hypertension  . Knee Pain       HPI: Richard Barnett is a 59 y.o. Richard Barnett here today to establish medical care and to receive treatment for knee pain and hypertension.  He has a history of hypertension, CAD, MI, stent, renal insuffiency. He has been on disability and medicaid since a teenager but he does not know why. He was seen in ED on 5/16 and was prescribed lisinopril and metoprolol but has not yet filled that. He has been out of medications for at least a month. His BP today was initially 197/122. Was given Clonidine 0.2 mg with a decrease to 190/79.  He is a very poor historian  I suspect there may be some mental slowness involved in his disability. However he does say that he has had high BP since his teens. He is unclear as to when he started having heart problems.  He denies any chest pain since his stent. He does admit to some SOB with rest and with exercise.  Allergies  Allergen Reactions  . Penicillins Swelling and Rash   Past Medical History  Diagnosis Date  . Hypertension   . MI (myocardial infarction)    Current Outpatient Prescriptions on File Prior to Visit  Medication Sig Dispense Refill  . aspirin 81 MG EC tablet Take 1 tablet (81 mg total) by mouth daily. 30 tablet 1  . colchicine 0.6 MG tablet Take 1 tablet (0.6 mg total) by mouth daily. Take two tabs by mouth at once and then a third tablet one hour later 3 tablet 0  . lisinopril (PRINIVIL,ZESTRIL) 10 MG tablet Take 1 tablet (10 mg total) by mouth daily. 30 tablet 1  . lisinopril (PRINIVIL,ZESTRIL) 10 MG tablet Take 1 tablet (10 mg total) by mouth daily. 30 tablet 0  . lovastatin (MEVACOR) 20 MG tablet Take 1 tablet (20 mg total) by mouth at bedtime. 30 tablet 1  . metoprolol (LOPRESSOR) 50 MG tablet Take 1 tablet  (50 mg total) by mouth 2 (two) times daily. 60 tablet 1  . metoprolol (LOPRESSOR) 50 MG tablet Take 1 tablet (50 mg total) by mouth 2 (two) times daily. 60 tablet 0  . [DISCONTINUED] amLODipine (NORVASC) 10 MG tablet Take 1 tablet (10 mg total) by mouth daily. 30 tablet 1  . [DISCONTINUED] atorvastatin (LIPITOR) 40 MG tablet Take 1 tablet (40 mg total) by mouth daily at 6 PM. 30 tablet 1   No current facility-administered medications on file prior to visit.   No family history on file. History   Social History  . Marital Status: Single    Spouse Name: N/A  . Number of Children: N/A  . Years of Education: N/A   Occupational History  . Not on file.   Social History Main Topics  . Smoking status: Former Games developer  . Smokeless tobacco: Never Used  . Alcohol Use: No  . Drug Use: No  . Sexual Activity: Not Currently   Other Topics Concern  . Not on file   Social History Narrative    Review of Systems: Constitutional: Negative for fever, chills, appetite change, weight loss,  fatigue. HENT: Negative for ear pain, ear discharge.nose bleeds Eyes: Negative for pain, discharge, redness, itching and visual disturbance. Neck: Negative for pain,  stiffness Respiratory: Negative for cough, shortness of breath,   Cardiovascular: Negative for chest pain, palpitations and leg swelling. Gastrointestinal: Negative for abdominal distention, abdominal pain, nausea, vomiting, diarrhea, constipations Genitourinary: Negative for dysuria, urgency, frequency, hematuria, flank pain,  Musculoskeletal: Negative for back pain, arthralgia and gait problem.Negative for weakness.Positive for right knee pain and swelling. Neurological: Negative for dizziness, tremors, seizures, syncope,   light-headedness, numbness and headaches.  Hematological: Negative for easy bruising or bleeding Psychiatric/Behavioral: Negative for depression, anxiety, Positive for decreased concentration   Objective:   Filed Vitals:     07/24/14 1424  BP: 197/122  Pulse: 65  Temp: 97.9 F (36.6 C)  Resp: 16    Physical Exam: Constitutional: Patient appears well-developed and well-nourished. No distress. HENT: Normocephalic, atraumatic, External right and left ear normal. Oropharynx is clear and moist.  Eyes: Conjunctivae and EOM are normal. PERRLA, no scleral icterus. Neck: Normal ROM. Neck supple. No lymphadenopathy, No thyromegaly. CVS: RRR, S1/S2 +, no murmurs, no gallops, no rubs Pulmonary: Effort and breath sounds normal, no stridor, rhonchi, wheezes, rales.  Abdominal: Soft. Normoactive BS,, no distension, tenderness, rebound or guarding.  Musculoskeletal: Normal range of motion. No edema and no tenderness. Pain and crepitus of right knee with passive ROM. Neuro: Alert.Normal muscle tone coordination. Non-focal Skin: Skin is warm and dry. No rash noted. Not diaphoretic. No erythema. No pallor. Psychiatric: Normal mood and affect. Behavior, judgment, thought content normal.Seems a little slow in mentation.  Lab Results  Component Value Date   WBC 6.6 05/22/2014   HGB 13.6 05/22/2014   HCT 41.8 05/22/2014   MCV 86.7 05/22/2014   PLT 262 05/22/2014   Lab Results  Component Value Date   CREATININE 2.35* 05/24/2014   BUN 34* 05/24/2014   NA 140 05/24/2014   K 4.0 05/24/2014   CL 104 05/24/2014   CO2 26 05/24/2014    Lab Results  Component Value Date   HGBA1C 6.4* 05/22/2014   Lipid Panel     Component Value Date/Time   CHOL 275* 05/22/2014 0410   TRIG 173* 05/22/2014 0410   HDL 37* 05/22/2014 0410   CHOLHDL 7.4 05/22/2014 0410   VLDL 35 05/22/2014 0410   LDLCALC 203* 05/22/2014 0410       Assessment and plan:   1. Essential hypertension Clonidine (CATAPRES) tablet 0.2 mg; Take 2 tablets (0.2 mg total) by mouth daily.  Get on BP medication regularly. Explained the importance of getting BP under control to maintain renal function Explained that I cannot prescribe NSAIDs for his knee  pain due to his decreased renal function and hypertension He is to return for BP check with nurse in 10 days and make appointment with PCP in one month. I have prescribed Tramadol for moderate to severe pain.   No Follow-up on file.  The patient was given clear instructions to go to ER or return to medical center if symptoms don't improve, worsen or new problems develop. The patient verbalized understanding. The patient was told to call to get lab results if they haven't heard anything in the next week.        Richard Hoover, MSN, FNP-BC [facility] [place], [place] [phone]   07/24/2014, 2:40 PM

## 2014-07-25 NOTE — Progress Notes (Signed)
Pt is aware of lab work and will follow up at next visit on 08/19/2014

## 2014-08-19 ENCOUNTER — Ambulatory Visit: Payer: Medicaid Other

## 2014-08-19 ENCOUNTER — Ambulatory Visit: Payer: Medicaid Other | Admitting: Internal Medicine

## 2014-08-22 ENCOUNTER — Emergency Department (HOSPITAL_COMMUNITY): Payer: Medicaid Other

## 2014-08-22 ENCOUNTER — Observation Stay (HOSPITAL_COMMUNITY): Payer: Medicaid Other

## 2014-08-22 ENCOUNTER — Observation Stay (HOSPITAL_COMMUNITY)
Admission: EM | Admit: 2014-08-22 | Discharge: 2014-08-23 | Disposition: A | Discharge status: Home / Self Care | Payer: Medicaid Other | Attending: Internal Medicine | Admitting: Internal Medicine

## 2014-08-22 ENCOUNTER — Encounter (HOSPITAL_COMMUNITY): Payer: Self-pay | Admitting: Emergency Medicine

## 2014-08-22 DIAGNOSIS — I160 Hypertensive urgency: Secondary | ICD-10-CM | POA: Diagnosis present

## 2014-08-22 DIAGNOSIS — I2510 Atherosclerotic heart disease of native coronary artery without angina pectoris: Secondary | ICD-10-CM | POA: Diagnosis not present

## 2014-08-22 DIAGNOSIS — N184 Chronic kidney disease, stage 4 (severe): Secondary | ICD-10-CM | POA: Diagnosis present

## 2014-08-22 DIAGNOSIS — N189 Chronic kidney disease, unspecified: Secondary | ICD-10-CM | POA: Diagnosis not present

## 2014-08-22 DIAGNOSIS — Z87891 Personal history of nicotine dependence: Secondary | ICD-10-CM | POA: Diagnosis not present

## 2014-08-22 DIAGNOSIS — I509 Heart failure, unspecified: Secondary | ICD-10-CM | POA: Diagnosis not present

## 2014-08-22 DIAGNOSIS — Z79899 Other long term (current) drug therapy: Secondary | ICD-10-CM | POA: Diagnosis not present

## 2014-08-22 DIAGNOSIS — I252 Old myocardial infarction: Secondary | ICD-10-CM | POA: Insufficient documentation

## 2014-08-22 DIAGNOSIS — R410 Disorientation, unspecified: Secondary | ICD-10-CM

## 2014-08-22 DIAGNOSIS — Z86718 Personal history of other venous thrombosis and embolism: Secondary | ICD-10-CM | POA: Insufficient documentation

## 2014-08-22 DIAGNOSIS — R4182 Altered mental status, unspecified: Secondary | ICD-10-CM | POA: Insufficient documentation

## 2014-08-22 DIAGNOSIS — I209 Angina pectoris, unspecified: Secondary | ICD-10-CM | POA: Diagnosis not present

## 2014-08-22 DIAGNOSIS — G9340 Encephalopathy, unspecified: Secondary | ICD-10-CM

## 2014-08-22 DIAGNOSIS — I10 Essential (primary) hypertension: Secondary | ICD-10-CM | POA: Diagnosis not present

## 2014-08-22 DIAGNOSIS — E785 Hyperlipidemia, unspecified: Secondary | ICD-10-CM | POA: Diagnosis present

## 2014-08-22 DIAGNOSIS — E780 Pure hypercholesterolemia: Secondary | ICD-10-CM | POA: Insufficient documentation

## 2014-08-22 DIAGNOSIS — R011 Cardiac murmur, unspecified: Secondary | ICD-10-CM | POA: Diagnosis not present

## 2014-08-22 DIAGNOSIS — Z9861 Coronary angioplasty status: Secondary | ICD-10-CM | POA: Insufficient documentation

## 2014-08-22 DIAGNOSIS — Z880 Allergy status to penicillin: Secondary | ICD-10-CM | POA: Insufficient documentation

## 2014-08-22 DIAGNOSIS — I129 Hypertensive chronic kidney disease with stage 1 through stage 4 chronic kidney disease, or unspecified chronic kidney disease: Secondary | ICD-10-CM | POA: Diagnosis present

## 2014-08-22 DIAGNOSIS — M1711 Unilateral primary osteoarthritis, right knee: Secondary | ICD-10-CM | POA: Diagnosis not present

## 2014-08-22 DIAGNOSIS — Z7982 Long term (current) use of aspirin: Secondary | ICD-10-CM | POA: Diagnosis not present

## 2014-08-22 DIAGNOSIS — I674 Hypertensive encephalopathy: Secondary | ICD-10-CM | POA: Diagnosis present

## 2014-08-22 HISTORY — DX: Headache, unspecified: R51.9

## 2014-08-22 HISTORY — DX: Acute embolism and thrombosis of unspecified deep veins of unspecified lower extremity: I82.409

## 2014-08-22 HISTORY — DX: Pure hypercholesterolemia, unspecified: E78.00

## 2014-08-22 HISTORY — DX: Depression, unspecified: F32.A

## 2014-08-22 HISTORY — DX: Headache: R51

## 2014-08-22 HISTORY — DX: Major depressive disorder, single episode, unspecified: F32.9

## 2014-08-22 HISTORY — DX: Unspecified osteoarthritis, unspecified site: M19.90

## 2014-08-22 LAB — CBC
HCT: 41.3 % (ref 39.0–52.0)
HEMOGLOBIN: 13.6 g/dL (ref 13.0–17.0)
MCH: 28.8 pg (ref 26.0–34.0)
MCHC: 32.9 g/dL (ref 30.0–36.0)
MCV: 87.3 fL (ref 78.0–100.0)
Platelets: 210 10*3/uL (ref 150–400)
RBC: 4.73 MIL/uL (ref 4.22–5.81)
RDW: 14.6 % (ref 11.5–15.5)
WBC: 5.1 10*3/uL (ref 4.0–10.5)

## 2014-08-22 LAB — COMPREHENSIVE METABOLIC PANEL
ALT: 22 U/L (ref 17–63)
ANION GAP: 6 (ref 5–15)
AST: 27 U/L (ref 15–41)
Albumin: 3.3 g/dL — ABNORMAL LOW (ref 3.5–5.0)
Alkaline Phosphatase: 50 U/L (ref 38–126)
BUN: 18 mg/dL (ref 6–20)
CHLORIDE: 109 mmol/L (ref 101–111)
CO2: 24 mmol/L (ref 22–32)
CREATININE: 2.22 mg/dL — AB (ref 0.61–1.24)
Calcium: 8.6 mg/dL — ABNORMAL LOW (ref 8.9–10.3)
GFR calc Af Amer: 36 mL/min — ABNORMAL LOW (ref >60)
GFR, EST NON AFRICAN AMERICAN: 31 mL/min — AB (ref >60)
Glucose, Bld: 95 mg/dL (ref 65–99)
Potassium: 5 mmol/L (ref 3.5–5.1)
Sodium: 139 mmol/L (ref 135–145)
Total Bilirubin: 0.8 mg/dL (ref 0.3–1.2)
Total Protein: 6.9 g/dL (ref 6.5–8.1)

## 2014-08-22 LAB — I-STAT CHEM 8, ED
BUN: 26 mg/dL — AB (ref 6–20)
CALCIUM ION: 1.1 mmol/L — AB (ref 1.12–1.23)
Chloride: 107 mmol/L (ref 101–111)
Creatinine, Ser: 2.4 mg/dL — ABNORMAL HIGH (ref 0.61–1.24)
Glucose, Bld: 92 mg/dL (ref 65–99)
HCT: 45 % (ref 39.0–52.0)
Hemoglobin: 15.3 g/dL (ref 13.0–17.0)
Potassium: 5 mmol/L (ref 3.5–5.1)
SODIUM: 142 mmol/L (ref 135–145)
TCO2: 25 mmol/L (ref 0–100)

## 2014-08-22 LAB — I-STAT TROPONIN, ED: Troponin i, poc: 0.01 ng/mL (ref 0.00–0.08)

## 2014-08-22 LAB — DIFFERENTIAL
BASOS ABS: 0.1 10*3/uL (ref 0.0–0.1)
BASOS PCT: 1 % (ref 0–1)
Eosinophils Absolute: 0.3 10*3/uL (ref 0.0–0.7)
Eosinophils Relative: 5 % (ref 0–5)
Lymphocytes Relative: 45 % (ref 12–46)
Lymphs Abs: 2.3 10*3/uL (ref 0.7–4.0)
MONOS PCT: 12 % (ref 3–12)
Monocytes Absolute: 0.6 10*3/uL (ref 0.1–1.0)
NEUTROS ABS: 1.9 10*3/uL (ref 1.7–7.7)
NEUTROS PCT: 37 % — AB (ref 43–77)

## 2014-08-22 LAB — URINALYSIS, ROUTINE W REFLEX MICROSCOPIC
BILIRUBIN URINE: NEGATIVE
Glucose, UA: NEGATIVE mg/dL
Ketones, ur: NEGATIVE mg/dL
LEUKOCYTES UA: NEGATIVE
NITRITE: NEGATIVE
Protein, ur: >300 mg/dL — AB
Specific Gravity, Urine: 1.02 (ref 1.005–1.030)
UROBILINOGEN UA: 0.2 mg/dL (ref 0.0–1.0)
pH: 5.5 (ref 5.0–8.0)

## 2014-08-22 LAB — CBG MONITORING, ED: GLUCOSE-CAPILLARY: 93 mg/dL (ref 65–99)

## 2014-08-22 LAB — RAPID URINE DRUG SCREEN, HOSP PERFORMED
Amphetamines: NOT DETECTED
BARBITURATES: NOT DETECTED
BENZODIAZEPINES: NOT DETECTED
Cocaine: NOT DETECTED
Opiates: NOT DETECTED
Tetrahydrocannabinol: NOT DETECTED

## 2014-08-22 LAB — URINE MICROSCOPIC-ADD ON

## 2014-08-22 LAB — ETHANOL

## 2014-08-22 LAB — PROTIME-INR
INR: 1.01 (ref 0.00–1.49)
Prothrombin Time: 13.5 seconds (ref 11.6–15.2)

## 2014-08-22 LAB — MRSA PCR SCREENING: MRSA by PCR: NEGATIVE

## 2014-08-22 LAB — APTT: APTT: 27 s (ref 24–37)

## 2014-08-22 MED ORDER — METOPROLOL TARTRATE 50 MG PO TABS
50.0000 mg | ORAL_TABLET | Freq: Two times a day (BID) | ORAL | Status: DC
  Administered 2014-08-22 – 2014-08-23 (×2): 50 mg via ORAL
  Filled 2014-08-22 (×2): qty 1

## 2014-08-22 MED ORDER — LABETALOL HCL 5 MG/ML IV SOLN
20.0000 mg | Freq: Once | INTRAVENOUS | Status: AC
  Administered 2014-08-22: 20 mg via INTRAVENOUS
  Filled 2014-08-22: qty 4

## 2014-08-22 MED ORDER — SODIUM CHLORIDE 0.9 % IV SOLN: 250.0000 mL | INTRAVENOUS | Status: DC | PRN

## 2014-08-22 MED ORDER — SODIUM CHLORIDE 0.9 % IJ SOLN: 3.0000 mL | INTRAMUSCULAR | Status: DC | PRN

## 2014-08-22 MED ORDER — STROKE: EARLY STAGES OF RECOVERY BOOK
Freq: Once | Status: DC
  Filled 2014-08-22: qty 1

## 2014-08-22 MED ORDER — HYDRALAZINE HCL 20 MG/ML IJ SOLN
5.0000 mg | Freq: Once | INTRAMUSCULAR | Status: AC
  Administered 2014-08-22: 5 mg via INTRAVENOUS
  Filled 2014-08-22: qty 1

## 2014-08-22 MED ORDER — COLCHICINE 0.6 MG PO TABS: 0.6000 mg | ORAL_TABLET | Freq: Every day | ORAL | Status: DC | PRN

## 2014-08-22 MED ORDER — ASPIRIN 81 MG PO CHEW
324.0000 mg | CHEWABLE_TABLET | Freq: Once | ORAL | Status: AC
  Administered 2014-08-22: 324 mg via ORAL
  Filled 2014-08-22: qty 4

## 2014-08-22 MED ORDER — SODIUM CHLORIDE 0.9 % IJ SOLN
3.0000 mL | Freq: Two times a day (BID) | INTRAMUSCULAR | Status: DC
  Administered 2014-08-22 – 2014-08-23 (×2): 3 mL via INTRAVENOUS

## 2014-08-22 MED ORDER — ASPIRIN 81 MG PO CHEW
81.0000 mg | CHEWABLE_TABLET | Freq: Every day | ORAL | Status: DC
  Administered 2014-08-23: 81 mg via ORAL
  Filled 2014-08-22 (×2): qty 1

## 2014-08-22 MED ORDER — HYDRALAZINE HCL 20 MG/ML IJ SOLN
10.0000 mg | INTRAMUSCULAR | Status: DC | PRN
  Administered 2014-08-22: 10 mg via INTRAVENOUS
  Filled 2014-08-22: qty 1

## 2014-08-22 MED ORDER — LISINOPRIL 10 MG PO TABS: 10.0000 mg | ORAL_TABLET | Freq: Every day | ORAL | Status: DC

## 2014-08-22 NOTE — ED Notes (Signed)
Admitting at bedside 

## 2014-08-22 NOTE — ED Provider Notes (Signed)
CSN: [id number]     Arrival date & time 08/22/14  1519 History   First MD Initiated Contact with Patient 08/22/14 1522     Chief Complaint  Patient presents with  . Hypertension  . Altered Mental Status     (Consider location/radiation/quality/duration/timing/severity/associated sxs/prior Treatment) HPI Comments: Patient from free clinic with elevated BP.  States he was visiting a friend at [employer] and they checked his BP "just because".  It was 210/130.  Patient does have a history of hypertension and states compliance with his metoprolol and amlodipine. He endorses gradual onset headache. At the free clinic he had some disorientation and confusion which is now resolved. He did not know his name, age or date. He has some slurred speech which is since resolved. Now he currently feels some lightheadedness and dizziness with a gradual onset headache. Denies chest pain or shortness of breath. Denies any fever or chills. Denies any drug use. Record review shows he was prescribed clonidine [facility] he is not aware of this. Denies any history of stroke or cardiac disease.  Patient is a 59 y.o. [place] presenting with altered mental status. The history is provided by the patient and the EMS personnel.  Altered Mental Status Associated symptoms: headaches, light-headedness and weakness   Associated symptoms: no abdominal pain, no fever, no nausea, no rash and no vomiting     Past Medical History  Diagnosis Date  . Hypertension   . Hypercholesterolemia   . Heart murmur   . CHF (congestive heart failure)   . Anginal pain   . MI (myocardial infarction)     "I've had couple when I was asleep; a couple when I was awake" (08/22/2014)  . DVT (deep venous thrombosis) ?    RLE  . Daily headache   . Depression     "I always get that" (08/22/2014)  . Chronic kidney disease     "from my blood pressure problems" (08/22/2014)  . Chronic renal insufficiency     [name] 08/22/2014   . Arthritis     "right leg" (08/22/2014)   Past Surgical History  Procedure Laterality Date  . Coronary angioplasty with stent placement      "1 + 1"   History reviewed. No pertinent family history. History  Substance Use Topics  . Smoking status: Former Smoker -- 3.00 packs/day for 40 years    Types: Cigarettes  . Smokeless tobacco: Never Used     Comment: "quit smoking cigarettes in ~ 2011"  . Alcohol Use: Yes     Comment: 08/22/2014 "stopped in ~ 2011"    Review of Systems  Constitutional: Negative for fever, activity change and appetite change.  HENT: Negative for congestion and rhinorrhea.   Eyes: Negative for visual disturbance.  Respiratory: Negative for cough, chest tightness and shortness of breath.   Cardiovascular: Negative for chest pain and leg swelling.  Gastrointestinal: Negative for nausea, vomiting and abdominal pain.  Genitourinary: Negative for dysuria, urgency and hematuria.  Musculoskeletal: Negative for myalgias and arthralgias.  Skin: Negative for rash.  Neurological: Positive for dizziness, speech difficulty, weakness, light-headedness and headaches.  A complete 10 system review of systems was obtained and all systems are negative except as noted in the HPI and PMH.      Allergies  Penicillins  Home Medications   Prior to Admission medications   Medication Sig Start Date End Date Taking? Authorizing Provider  aspirin 81 MG EC tablet Take 1 tablet (81 mg  total) by mouth daily. 05/28/14  Yes Azalia Bilis, MD  colchicine 0.6 MG tablet Take 0.6 mg by mouth as needed (for gout flareups).   Yes Historical Provider, MD  lisinopril (PRINIVIL,ZESTRIL) 10 MG tablet Take 1 tablet (10 mg total) by mouth daily. 07/22/14  Yes Mathis Fare Presson, PA  metoprolol (LOPRESSOR) 50 MG tablet Take 1 tablet (50 mg total) by mouth 2 (two) times daily. 07/22/14  Yes Mathis Fare Presson, PA  traMADol (ULTRAM) 50 MG tablet Take 1 tablet (50 mg total) by mouth every 8  (eight) hours as needed (will cause drowsiness). 07/24/14   Henrietta Hoover, NP   BP 162/88 mmHg  Pulse 79  Temp(Src) 98.2 F (36.8 C) (Oral)  Resp 15  Ht  (1.702 m)  Wt 225 lb 9.6 oz (102.331 kg)  BMI 35.33 kg/m2  SpO2 100% Physical Exam  Constitutional: He is oriented to person, place, and time. He appears well-developed and well-nourished. No distress.  HENT:  Head: Normocephalic and atraumatic.  Mouth/Throat: Oropharynx is clear and moist. No oropharyngeal exudate.  Eyes: Conjunctivae and EOM are normal. Pupils are equal, round, and reactive to light.  Neck: Normal range of motion. Neck supple.  No meningismus.  Cardiovascular: Normal rate, regular rhythm, normal heart sounds and intact distal pulses.   No murmur heard. Pulmonary/Chest: Effort normal and breath sounds normal. No respiratory distress.  Abdominal: Soft. There is no tenderness. There is no rebound and no guarding.  Musculoskeletal: Normal range of motion. He exhibits no edema or tenderness.  Neurological: He is alert and oriented to person, place, and time. No cranial nerve deficit. He exhibits normal muscle tone. Coordination normal.  No ataxia on finger to nose bilaterally. No pronator drift. 5/5 strength throughout. CN 2-12 intact. Equal grip strength. Sensation intact  Oriented to "hospital".   Skin: Skin is warm.  Psychiatric: He has a normal mood and affect. His behavior is normal.  Nursing note and vitals reviewed.   ED Course  Procedures (including critical care time) Labs Review Labs Reviewed  DIFFERENTIAL - Abnormal; Notable for the following:    Neutrophils Relative % 37 (*)    All other components within normal limits  COMPREHENSIVE METABOLIC PANEL - Abnormal; Notable for the following:    Creatinine, Ser 2.22 (*)    Calcium 8.6 (*)    Albumin 3.3 (*)    GFR calc non Af Amer 31 (*)    GFR calc Af Amer 36 (*)    All other components within normal limits  URINALYSIS, ROUTINE W REFLEX  MICROSCOPIC (NOT AT [facility]) - Abnormal; Notable for the following:    Hgb urine dipstick SMALL (*)    Protein, ur >300 (*)    All other components within normal limits  URINE MICROSCOPIC-ADD ON - Abnormal; Notable for the following:    Casts HYALINE CASTS (*)    All other components within normal limits  I-STAT CHEM 8, ED - Abnormal; Notable for the following:    BUN 26 (*)    Creatinine, Ser 2.40 (*)    Calcium, Ion 1.10 (*)    All other components within normal limits  MRSA PCR SCREENING  ETHANOL  PROTIME-INR  APTT  CBC  URINE RAPID DRUG SCREEN, HOSP PERFORMED  HEMOGLOBIN A1C  LIPID PANEL  I-STAT TROPOININ, ED  CBG MONITORING, ED    Imaging Review Dg Chest 2 View  08/23/2014   CLINICAL DATA:  Confusion. History of headaches, hypertension, and blurred vision.  EXAM: CHEST  2 VIEW  COMPARISON:  05/21/2014  FINDINGS: Mild hyperinflation suggesting emphysema. Normal heart size and pulmonary vascularity. No focal airspace disease or consolidation in the lungs. No blunting of costophrenic angles. No pneumothorax. Mediastinal contours appear intact. Tortuous aorta. Degenerative changes in the spine and shoulders.  IMPRESSION: Emphysematous changes in the lungs. No evidence of active pulmonary disease.   Electronically Signed   By: Burman Nieves M.D.   On: 08/23/2014 01:29   Ct Head Wo Contrast  08/22/2014   CLINICAL DATA:  Hypertension, confusion/altered mental status, history coronary artery disease post MI, chronic renal insufficiency  EXAM: CT HEAD WITHOUT CONTRAST  TECHNIQUE: Contiguous axial images were obtained from the base of the skull through the vertex without intravenous contrast.  COMPARISON:  05/21/2014  FINDINGS: Generalized atrophy.  Normal ventricular morphology.  No midline shift or mass effect.  Old LEFT basal ganglia lacunar infarct.  Small vessel chronic ischemic changes of deep cerebral white matter.  No intracranial hemorrhage, mass lesion or evidence acute infarction.   No extra-axial fluid collections.  Sinuses clear.  Bones unremarkable.  IMPRESSION: Small vessel chronic ischemic changes of deep cerebral white matter.  Old LEFT basal ganglia lacunar infarct.  No acute intracranial abnormalities.   Electronically Signed   By: Ulyses Southward M.D.   On: 08/22/2014 17:16   Mr Brain Wo Contrast  08/22/2014   CLINICAL DATA:  Altered mental status.  Hypertension.  EXAM: MRI HEAD WITHOUT CONTRAST  TECHNIQUE: Multiplanar, multiecho pulse sequences of the brain and surrounding structures were obtained without intravenous contrast.  COMPARISON:  Head CT earlier today  FINDINGS: There is no evidence of acute infarct, mass, midline shift, or extra-axial fluid collection. Chronic lacunar infarcts are present in the left greater than right basal ganglia, left internal capsule, and corona radiata bilaterally. There is a small focus of chronic hemorrhage in the right putamen. Small, chronic infarcts are noted in the pons and left cerebellum.  There is mild global cerebral atrophy. Patchy and confluent T2 hyperintensities in the deep greater than subcortical cerebral white matter bilaterally are nonspecific but compatible with moderate chronic small vessel ischemic disease. The septum pellucidum appears absent posteriorly.  Orbits are unremarkable. There is a small left mastoid effusion. Paranasal sinuses are clear. Joint effusion is noted at the right atlanto-occipital articulation. Major intracranial vascular flow voids are preserved.  IMPRESSION: 1. No acute intracranial abnormality. 2. Moderate chronic small vessel ischemic disease with multiple chronic lacunar infarcts as above.   Electronically Signed   By: Sebastian Ache   On: 08/22/2014 20:36     EKG Interpretation None      MDM   Final diagnoses:  Hypertensive urgency   Patient from free clinic with episode of headache, hypertension and disorientation. Now oriented. Denies complaints. States compliance with blood pressure  medication. He is hypertensive with a nonfocal neurological exam.  CT head negative. CR elevated as per baseline.  BP uncontrolled.  Hydralazine given. Some concern for hypertensive encephalopathy versus stroke.  Unclear last seen normal and symptoms improving so code stroke not activated.  Treat BP, not lower too aggressively in case of new stroke.  D/w Dr. Onalee Hua who requests neurology consult.   CRITICAL CARE Performed by: Glynn Octave Total critical care time: 35 Critical care time was exclusive of separately billable procedures and treating other patients. Critical care was necessary to treat or prevent imminent or life-threatening deterioration. Critical care was time spent personally by me on the following activities: development  of treatment plan with patient and/or surrogate as well as nursing, discussions with consultants, evaluation of patient's response to treatment, examination of patient, obtaining history from patient or surrogate, ordering and performing treatments and interventions, ordering and review of laboratory studies, ordering and review of radiographic studies, pulse oximetry and re-evaluation of patient's condition.    Glynn Octave, MD 08/23/14 [phone]

## 2014-08-22 NOTE — H&P (Signed)
PCP:   No primary care provider on file.   Chief Complaint:  High blood pressure  HPI: 59 yo [place] h/o htn and "disability his momma got him" when he was a child sent in from homeless shelter after his bp being checked and sbp was over 200, staff noted he was confused.  Pt reports inconsistent history, he says he is homeless for the last 3 years but than says he has a 1 bedroom apartment.  He denies any pscyhiactric history such as schizophrenia.  Denies any drugs.  Reports he is taking 2 different bp pills and does not know if he really takes them daily or not. Denies any n/t or weakness anywhere.  Says he is nervous because he is in the ER.  When asked what other kind of medical problems he has besides htn he says "im slow".  Reports he has 9 children, some he sees some he does not see often.  Denies fevers, rashes, n/v/d.  He has had several elevated blood pressures in the ED.  Review of Systems:  Positive and negative as per HPI otherwise all other systems are negative  Past Medical History: Past Medical History  Diagnosis Date  . Hypertension   . MI (myocardial infarction)    Past Surgical History  Procedure Laterality Date  . Cardiac catheterization    . Coronary angioplasty with stent placement      Medications: Prior to Admission medications   Medication Sig Start Date End Date Taking? Authorizing Provider  aspirin 81 MG EC tablet Take 1 tablet (81 mg total) by mouth daily. 05/28/14  Yes Azalia Bilis, MD  colchicine 0.6 MG tablet Take 0.6 mg by mouth as needed (for gout flareups).   Yes Historical Provider, MD  lisinopril (PRINIVIL,ZESTRIL) 10 MG tablet Take 1 tablet (10 mg total) by mouth daily. 07/22/14  Yes Mathis Fare Presson, PA  metoprolol (LOPRESSOR) 50 MG tablet Take 1 tablet (50 mg total) by mouth 2 (two) times daily. 07/22/14  Yes Mathis Fare Presson, PA  traMADol (ULTRAM) 50 MG tablet Take 1 tablet (50 mg total) by mouth every 8 (eight) hours as needed (will  cause drowsiness). 07/24/14   Henrietta Hoover, NP    Allergies:   Allergies  Allergen Reactions  . Penicillins Swelling and Rash    Social History:  reports that he has quit smoking. He has never used smokeless tobacco. He reports that he does not drink alcohol or use illicit drugs.  Family History: History reviewed. No pertinent family history.  Physical Exam: Filed Vitals:   08/22/14 1520 08/22/14 1527 08/22/14 1830 08/22/14 1833  BP:  204/118 212/105   Pulse:  60 58   Temp:  98.2 F (36.8 C)  98 F (36.7 C)  TempSrc:  Oral    Resp:  14 16   SpO2: 100% 98% 98%    General appearance: alert, cooperative and no distress Head: Normocephalic, without obvious abnormality, atraumatic Eyes: negative Nose: Nares normal. Septum midline. Mucosa normal. No drainage or sinus tenderness. Neck: no JVD and supple, symmetrical, trachea midline Lungs: clear to auscultation bilaterally Heart: regular rate and rhythm, S1, S2 normal, no murmur, click, rub or gallop Abdomen: soft, non-tender; bowel sounds normal; no masses,  no organomegaly Extremities: extremities normal, atraumatic, no cyanosis or edema Pulses: 2+ and symmetric Skin: Skin color, texture, turgor normal. No rashes or lesions Neurologic: Grossly normal    Labs on Admission:   Recent Labs  08/22/14 1619 08/22/14 1627  NA 139 142  K 5.0 5.0  CL 109 107  CO2 24  --   GLUCOSE 95 92  BUN 18 26*  CREATININE 2.22* 2.40*  CALCIUM 8.6*  --     Recent Labs  08/22/14 1619  AST 27  ALT 22  ALKPHOS 50  BILITOT 0.8  PROT 6.9  ALBUMIN 3.3*    Recent Labs  08/22/14 1619 08/22/14 1627  WBC 5.1  --   NEUTROABS 1.9  --   HGB 13.6 15.3  HCT 41.3 45.0  MCV 87.3  --   PLT 210  --    Radiological Exams on Admission: Ct Head Wo Contrast  08/22/2014   CLINICAL DATA:  Hypertension, confusion/altered mental status, history coronary artery disease post MI, chronic renal insufficiency  EXAM: CT HEAD WITHOUT CONTRAST   TECHNIQUE: Contiguous axial images were obtained from the base of the skull through the vertex without intravenous contrast.  COMPARISON:  05/21/2014  FINDINGS: Generalized atrophy.  Normal ventricular morphology.  No midline shift or mass effect.  Old LEFT basal ganglia lacunar infarct.  Small vessel chronic ischemic changes of deep cerebral white matter.  No intracranial hemorrhage, mass lesion or evidence acute infarction.  No extra-axial fluid collections.  Sinuses clear.  Bones unremarkable.  IMPRESSION: Small vessel chronic ischemic changes of deep cerebral white matter.  Old LEFT basal ganglia lacunar infarct.  No acute intracranial abnormalities.   Electronically Signed   By: Ulyses Southward M.D.   On: 08/22/2014 17:16   ekg reviewed nsr Old records reviewed Case discussed with edp  Assessment/Plan  59 yo [place] with hypertensive emergency in setting of encephalpathy  Principal Problem:   Encephalopathy acute- either from cva or htn encephalopathy.  Ck mri brain.  Obtain neurology consult for further recommendations.  Place in stepdown with prn hydralazine order with paramenters.  freq neuro checks.  Suspect he may have some underlying psychiatric chronic illness.  Neuro exam nonfocal.    Active Problems:   CAD (coronary artery disease)   Hypertensive urgency- as above, allow some permissive high bp until sure not acute stroke.   Chronic renal insufficiency- at his baseline   Hyperlipidemia- stable  obs on stepdown.  Full code.  Cloys Vera A 08/22/2014, 7:23 PM

## 2014-08-22 NOTE — ED Notes (Signed)
Pt. Is going to CT. 

## 2014-08-22 NOTE — ED Notes (Signed)
Pt provided with urinal and asked for a sample.

## 2014-08-22 NOTE — Care Management (Addendum)
Reviewed patient's record patient has received care at the [facility]. Patient has [place] [employer]. Follow up appt scheduled for Monday June 20th at 3:30p.

## 2014-08-22 NOTE — ED Notes (Signed)
MD at bedside. 

## 2014-08-22 NOTE — ED Notes (Signed)
MD okay'd pt to go to MRI

## 2014-08-22 NOTE — ED Notes (Signed)
MD made aware of pt's confusion.  Pt unable to verify correct DOB.  Verified with license picture on file.  Pt also believes he is in [place], and was unable to tell RN the month.  Pt reoriented to correct answers.    Pt also continues to ask to eat.  This RN made pt aware of reasonings for not being able to eat at this time.

## 2014-08-22 NOTE — ED Notes (Signed)
This RN unable to obtain IV access.  Phlebotomy made aware.

## 2014-08-22 NOTE — ED Notes (Signed)
Per EMS: Pt sent here from medical clinic at [facility].  RN on site measured pt's BP as 210/130.  Pt was unable to answer his age, date, or how to spell his daughter's name.  Pt with a hx of hypertension.  Pt sts she took "my two pills" this morning.  Pt currently on Metoprolol, Amlodipine and Atorvastatin.  Pt alert and oriented in room at this time.  Last BP from EMS was 188/108.

## 2014-08-22 NOTE — ED Notes (Signed)
Pt. Is back in room. 

## 2014-08-22 NOTE — Consult Note (Signed)
Consult Reason for Consult:altered mental status Referring Physician: Dr Manus Gunning  CC: altered mental status  HPI: Richard Barnett is an 58 y.o. [place] with history of hypertension presenting for evaluation of altered mental status. Patient was at clinic when they noted BP of 210/130. While at the clinic noted to have transient episode of confusion, reported that he did not know his name, age or date. Also noted to have slurred speech which has also resolved. Currently reports a dull pressure type headache.   No prior CVA or TIA history. Takes metoprolol and norvasc and reports good compliance.  MRI brain imaging reviewed, shows no acute process.   Past Medical History  Diagnosis Date  . Hypertension   . MI (myocardial infarction)     Past Surgical History  Procedure Laterality Date  . Cardiac catheterization    . Coronary angioplasty with stent placement      History reviewed. No pertinent family history.  Social History:  reports that he has quit smoking. He has never used smokeless tobacco. He reports that he does not drink alcohol or use illicit drugs.  Allergies  Allergen Reactions  . Penicillins Swelling and Rash    Medications:  Scheduled: .  stroke: mapping our early stages of recovery book   Does not apply Once  . aspirin  81 mg Oral Daily  . [START ON 08/23/2014] lisinopril  10 mg Oral Daily  . metoprolol  50 mg Oral BID  . sodium chloride  3 mL Intravenous Q12H    ROS: Out of a complete 14 system review, the patient complains of only the following symptoms, and all other reviewed systems are negative. + confusion  Physical Examination: Filed Vitals:   08/22/14 1950  BP: 187/94  Pulse: 63  Temp:   Resp: 15   Physical Exam  Constitutional: He appears well-developed and well-nourished.  Psych: Affect appropriate to situation Eyes: No scleral injection HENT: No OP obstrucion Head: Normocephalic.  Cardiovascular: Normal rate and regular rhythm.   Respiratory: Effort normal and breath sounds normal.  GI: Soft. Bowel sounds are normal. No distension. There is no tenderness.  Skin: WDI  Neurologic Examination Mental Status: Alert, oriented to name and hospital, unsure of date. Unable to name current events or president.  Speech fluent without evidence of aphasia.  Mild dysarthria. Able to follow 3 step commands without difficulty. Cranial Nerves: II: funduscopic exam wnl bilaterally, visual fields grossly normal, pupils equal, round, reactive to light and accommodation III,IV, VI: ptosis not present, extra-ocular motions intact bilaterally V,VII: smile symmetric, facial light touch sensation normal bilaterally VIII: hearing normal bilaterally IX,X: gag reflex present XI: trapezius strength/neck flexion strength normal bilaterally XII: tongue strength normal  Motor: Right : Upper extremity    Left:     Upper extremity 5/5 deltoid       5/5 deltoid 5/5 biceps      5/5 biceps  5/5 triceps      5/5 triceps 5/5 hand grip      5/5 hand grip  Lower extremity     Lower extremity 5/5 hip flexor      5/5 hip flexor 5/5 quadricep      5/5 quadriceps  5/5 hamstrings     5/5 hamstrings 5/5 plantar flexion       5/5 plantar flexion 5/5 plantar extension     5/5 plantar extension Tone and bulk:normal tone throughout; no atrophy noted Sensory: Pinprick and light touch intact throughout, bilaterally Deep Tendon Reflexes: 2+ and symmetric throughout  Plantars: Right: downgoing   Left: downgoing Cerebellar: normal finger-to-nose, normal rapid alternating movements and normal heel-to-shin test Gait: normal gait and station  Laboratory Studies:   Basic Metabolic Panel:  Recent Labs Lab 08/22/14 1619 08/22/14 1627  NA 139 142  K 5.0 5.0  CL 109 107  CO2 24  --   GLUCOSE 95 92  BUN 18 26*  CREATININE 2.22* 2.40*  CALCIUM 8.6*  --     Liver Function Tests:  Recent Labs Lab 08/22/14 1619  AST 27  ALT 22  ALKPHOS 50   BILITOT 0.8  PROT 6.9  ALBUMIN 3.3*   No results for input(s): LIPASE, AMYLASE in the last 168 hours. No results for input(s): AMMONIA in the last 168 hours.  CBC:  Recent Labs Lab 08/22/14 1619 08/22/14 1627  WBC 5.1  --   NEUTROABS 1.9  --   HGB 13.6 15.3  HCT 41.3 45.0  MCV 87.3  --   PLT 210  --     Cardiac Enzymes: No results for input(s): CKTOTAL, CKMB, CKMBINDEX, TROPONINI in the last 168 hours.  BNP: Invalid input(s): POCBNP  CBG:  Recent Labs Lab 08/22/14 1803  GLUCAP 93    Microbiology: No results found for this or any previous visit.  Coagulation Studies:  Recent Labs  08/22/14 1619  LABPROT 13.5  INR 1.01    Urinalysis:  Recent Labs Lab 08/22/14 1755  COLORURINE YELLOW  LABSPEC 1.020  PHURINE 5.5  GLUCOSEU NEGATIVE  HGBUR SMALL*  BILIRUBINUR NEGATIVE  KETONESUR NEGATIVE  PROTEINUR >300*  UROBILINOGEN 0.2  NITRITE NEGATIVE  LEUKOCYTESUR NEGATIVE    Lipid Panel:     Component Value Date/Time   CHOL 260* 07/24/2014 1512   TRIG 266* 07/24/2014 1512   HDL 38* 07/24/2014 1512   CHOLHDL 6.8 07/24/2014 1512   VLDL 53* 07/24/2014 1512   LDLCALC 169* 07/24/2014 1512    HgbA1C:  Lab Results  Component Value Date   HGBA1C 6.4* 05/22/2014    Urine Drug Screen:     Component Value Date/Time   LABOPIA NONE DETECTED 08/22/2014 1755   COCAINSCRNUR NONE DETECTED 08/22/2014 1755   LABBENZ NONE DETECTED 08/22/2014 1755   AMPHETMU NONE DETECTED 08/22/2014 1755   THCU NONE DETECTED 08/22/2014 1755   LABBARB NONE DETECTED 08/22/2014 1755    Alcohol Level:  Recent Labs Lab 08/22/14 1619  ETH <5    Other results:  Imaging: Ct Head Wo Contrast  08/22/2014   CLINICAL DATA:  Hypertension, confusion/altered mental status, history coronary artery disease post MI, chronic renal insufficiency  EXAM: CT HEAD WITHOUT CONTRAST  TECHNIQUE: Contiguous axial images were obtained from the base of the skull through the vertex without  intravenous contrast.  COMPARISON:  05/21/2014  FINDINGS: Generalized atrophy.  Normal ventricular morphology.  No midline shift or mass effect.  Old LEFT basal ganglia lacunar infarct.  Small vessel chronic ischemic changes of deep cerebral white matter.  No intracranial hemorrhage, mass lesion or evidence acute infarction.  No extra-axial fluid collections.  Sinuses clear.  Bones unremarkable.  IMPRESSION: Small vessel chronic ischemic changes of deep cerebral white matter.  Old LEFT basal ganglia lacunar infarct.  No acute intracranial abnormalities.   Electronically Signed   By: Ulyses Southward M.D.   On: 08/22/2014 17:16     Assessment/Plan:  58y/o gentleman presenting with episode of altered mental status in the setting of blood pressure of 210/130. MRI brain imaging negative for acute stroke. Suspect symptoms are related to hypertensive  encephalopathy  -lowering of blood pressure per primary team -ASA  daily -will continue to follow   Elspeth [name], DO [facility] [phone]  If 7pm- 7am, please page neurology on call as listed in AMION. 08/22/2014, 8:27 PM

## 2014-08-22 NOTE — ED Notes (Signed)
EDP at bedside  

## 2014-08-23 DIAGNOSIS — E785 Hyperlipidemia, unspecified: Secondary | ICD-10-CM

## 2014-08-23 DIAGNOSIS — I10 Essential (primary) hypertension: Secondary | ICD-10-CM

## 2014-08-23 DIAGNOSIS — I674 Hypertensive encephalopathy: Secondary | ICD-10-CM | POA: Diagnosis present

## 2014-08-23 DIAGNOSIS — N184 Chronic kidney disease, stage 4 (severe): Secondary | ICD-10-CM | POA: Diagnosis present

## 2014-08-23 LAB — LIPID PANEL
CHOL/HDL RATIO: 7.8 ratio
CHOLESTEROL: 259 mg/dL — AB (ref 0–200)
HDL: 33 mg/dL — AB (ref >40)
LDL Cholesterol: 158 mg/dL — ABNORMAL HIGH (ref 0–99)
Triglycerides: 338 mg/dL — ABNORMAL HIGH (ref <150)
VLDL: 68 mg/dL — ABNORMAL HIGH (ref 0–40)

## 2014-08-23 MED ORDER — HYDRALAZINE HCL 25 MG PO TABS: 25.0000 mg | ORAL_TABLET | Freq: Three times a day (TID) | ORAL | Status: DC

## 2014-08-23 MED ORDER — AMLODIPINE BESYLATE 10 MG PO TABS: 10.0000 mg | ORAL_TABLET | Freq: Every day | ORAL | Status: DC

## 2014-08-23 MED ORDER — CLONIDINE HCL 0.2 MG PO TABS: 0.2000 mg | ORAL_TABLET | Freq: Two times a day (BID) | ORAL | Status: DC

## 2014-08-23 MED ORDER — AMLODIPINE BESYLATE 10 MG PO TABS
10.0000 mg | ORAL_TABLET | Freq: Every day | ORAL | Status: DC
Start: 2014-08-23 — End: 2014-08-23
  Administered 2014-08-23: 10 mg via ORAL
  Filled 2014-08-23: qty 1

## 2014-08-23 MED ORDER — METOPROLOL TARTRATE 50 MG PO TABS: 50.0000 mg | ORAL_TABLET | Freq: Two times a day (BID) | ORAL | Status: DC

## 2014-08-23 MED ORDER — ATORVASTATIN CALCIUM 40 MG PO TABS: 40.0000 mg | ORAL_TABLET | Freq: Every day | ORAL | Status: DC

## 2014-08-23 NOTE — Progress Notes (Signed)
Subjective: Spoke with daughter on phone. He has had memory issues for some time   Exam: Filed Vitals:   08/23/14 0945  BP: 150/80  Pulse: 67  Temp:   Resp:    Gen: In bed, NAD MS: awake, alert, oriented to person and month, but not place. Daughter states this is common, thinks he may be in [facility].  GY:BWLSL Motor: 5/5  Impression: 59 yo M with likely dementia with superimposed hypertensive encephalopathy. This is a difficult thing to evaluate as an inpatient, but advised them to  Mention these concerns to his PCP. He will likely need tsh and B12.    Recommendations: 1) follow up for memory evaluation.   Ritta Slot, MD [facility] [phone]  If 7pm- 7am, please page neurology on call as listed in AMION.

## 2014-08-23 NOTE — Progress Notes (Signed)
Chaplain was referred for consult. Pt was up and dress for DC. Pt asked for prayer. Chaplain prayed with Pt and wished Pt well and departed.    08/23/14 1000  Clinical Encounter Type  Visited With Patient  Visit Type Spiritual support  Referral From Nurse  Spiritual Encounters  Spiritual Needs Prayer

## 2014-08-23 NOTE — Discharge Summary (Signed)
Physician Discharge Summary  Richard Barnett ZOX:[id number] DOB: 11/06/1955 DOA: 08/22/2014  [facility]  Admit date: 08/22/2014 Discharge date: 08/23/2014  Time spent: 25 minutes  Recommendations for Outpatient Follow-up:  1. Discharge home with outpt follow up at the [facility] center on 08/26/2014  Discharge Diagnoses:  Principal Problem:   Encephalopathy, hypertensive  Active Problems:   CAD (coronary artery disease)   Hypertensive urgency   Hyperlipidemia   CKD (chronic kidney disease), stage III   Discharge Condition:fair  Diet recommendation: low sodium/ heart healthy  Filed Weights   08/22/14 2052 08/23/14 0437  Weight: 102.331 kg (225 lb 9.6 oz) 102.83 kg (226 lb 11.2 oz)    History of present illness:  Please refer to admission H&P for details, in brief, 59 year old Sreekar with history of uncontrolled hypertension and history of MI, gout, chronic kidney disease stage III who was sent to the ED from the shelter for acute confusion and markedly elevated blood pressure. Patient has had uncontrolled hypertension as outpatient as well and medications have been adjusted. His systolic blood pressure at the center was >200 and patient was found to be confused by the staff. He reported headache but no dizziness, blurred vision, nausea, vomiting, chest pain, palpitation shortness, abdominal pain, dysuria or hematuria, bowel symptoms. He reports taking his BP medications but is not very clear as to how many he takes. He is not compliant with his diet. Patient had uncontrolled hypertension upon arrival. Neurology was consulted given encephalopathy with concern for CVA. MRI brain was obtained which was negative for stroke and showed chronic ischemic changes. Patient admitted to step down monitoring. His mental status started to clear up on admission.  Hospital Course:  Hypertensive encephalopathy Patient has long-standing uncontrolled hypertension. He showed me the blood  pressure readings checked at the shelter and the mostly ranged from 180-200/90-110 mmhg. Patient is on metoprolol and lisinopril at home. He was started on 0.2 mg clonidine daily meeting outpatient evaluation last month. He was also on amlodipine as outpatient which has been discontinued for unclear reason. Patient blood pressure has been better on metoprolol, lisinopril and when necessary hydralazine.  -I would continue his current dose of metoprolol. I will add him back on amlodipine 10 mg daily. Given his underlying CKD , I will not continue lisinopril. I will increase his clonidine to 0.2 mg twice a day. -Patient instructed on diet modification (provided reading material on DASH diet plan) and regular exercise to lose weight. He has appointment at the [facility] on [facility]. I have also instructed him to monitor his blood pressure frequently at the shelter.   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 Renal function currently at baseline. Follow-up as outpatient.  Hyperlipidemia  History of CAD and chronic ischemic changes noted on MRI I would place him on Lipitor 40 mg daily.   Coronary artery disease Continue aspirin. Added statin  Patient is clinically stable and can be discharged home with outpatient PCP follow-up.   Procedures:  MRI brain    Consultations:  neurology  Discharge Exam: Filed Vitals:   08/23/14 0720  BP: 147/92  Pulse: 58  Temp: 98.2 F (36.8 C)  Resp: 18    General: middle aged [place] in NAD HEENT: no pallor, moist mucosa Cardiovascular: NS1&S2, no murmurs, rubs or gallop Respiratory: clear b/l, no added sounds GI: soft, NT, N,D BS+ Musculoskeletal: warm, no edema CNS: alert and oriented, non focal  Discharge Instructions    Current Discharge Medication List  START taking these medications   Details  amLODipine (NORVASC) 10 MG tablet Take 1 tablet (10 mg total) by mouth daily. Qty: 30 tablet, Refills:  0    atorvastatin (LIPITOR) 40 MG tablet Take 1 tablet (40 mg total) by mouth daily at 6 PM. Qty: 30 tablet, Refills: 0  clonidine 0.2 mg tablet                                   Take 1 tablet 2 times daily                                                                    Qty: 60 tablet, no refills    CONTINUE these medications which have NOT CHANGED   Details  aspirin 81 MG EC tablet Take 1 tablet (81 mg total) by mouth daily. Qty: 30 tablet, Refills: 1    colchicine 0.6 MG tablet Take 0.6 mg by mouth as needed (for gout flareups).    metoprolol (LOPRESSOR) 50 MG tablet Take 1 tablet (50 mg total) by mouth 2 (two) times daily. Qty: 60 tablet, Refills: 0      STOP taking these medications     lisinopril (PRINIVIL,ZESTRIL) 10 MG tablet      traMADol (ULTRAM) 50 MG tablet        Allergies  Allergen Reactions  . Penicillins Swelling and Rash   Follow-up Information    Follow up with [facility]     On 08/22/2014.   Why:  Appointment scheduled for follow up at the [facility] Monday June 20th at 3:30p with Dr. Carleene Overlie information:   [address] [place] [address] [phone]       The results of significant diagnostics from this hospitalization (including imaging, microbiology, ancillary and laboratory) are listed below for reference.    Significant Diagnostic Studies: Dg Chest 2 View  08/23/2014   CLINICAL DATA:  Confusion. History of headaches, hypertension, and blurred vision.  EXAM: CHEST  2 VIEW  COMPARISON:  05/21/2014  FINDINGS: Mild hyperinflation suggesting emphysema. Normal heart size and pulmonary vascularity. No focal airspace disease or consolidation in the lungs. No blunting of costophrenic angles. No pneumothorax. Mediastinal contours appear intact. Tortuous aorta. Degenerative changes in the spine and shoulders.  IMPRESSION: Emphysematous changes in the lungs. No evidence  of active pulmonary disease.   Electronically Signed   By: Burman Nieves M.D.   On: 08/23/2014 01:29   Ct Head Wo Contrast  08/22/2014   CLINICAL DATA:  Hypertension, confusion/altered mental status, history coronary artery disease post MI, chronic renal insufficiency  EXAM: CT HEAD WITHOUT CONTRAST  TECHNIQUE: Contiguous axial images were obtained from the base of the skull through the vertex without intravenous contrast.  COMPARISON:  05/21/2014  FINDINGS: Generalized atrophy.  Normal ventricular morphology.  No midline shift or mass effect.  Old LEFT basal ganglia lacunar infarct.  Small vessel chronic ischemic changes of deep cerebral white matter.  No intracranial hemorrhage, mass lesion or evidence acute infarction.  No extra-axial fluid collections.  Sinuses clear.  Bones unremarkable.  IMPRESSION: Small vessel chronic ischemic changes of deep  cerebral white matter.  Old LEFT basal ganglia lacunar infarct.  No acute intracranial abnormalities.   Electronically Signed   By: Ulyses Southward M.D.   On: 08/22/2014 17:16   Mr Brain Wo Contrast  08/22/2014   CLINICAL DATA:  Altered mental status.  Hypertension.  EXAM: MRI HEAD WITHOUT CONTRAST  TECHNIQUE: Multiplanar, multiecho pulse sequences of the brain and surrounding structures were obtained without intravenous contrast.  COMPARISON:  Head CT earlier today  FINDINGS: There is no evidence of acute infarct, mass, midline shift, or extra-axial fluid collection. Chronic lacunar infarcts are present in the left greater than right basal ganglia, left internal capsule, and corona radiata bilaterally. There is a small focus of chronic hemorrhage in the right putamen. Small, chronic infarcts are noted in the pons and left cerebellum.  There is mild global cerebral atrophy. Patchy and confluent T2 hyperintensities in the deep greater than subcortical cerebral white matter bilaterally are nonspecific but compatible with moderate chronic small vessel ischemic  disease. The septum pellucidum appears absent posteriorly.  Orbits are unremarkable. There is a small left mastoid effusion. Paranasal sinuses are clear. Joint effusion is noted at the right atlanto-occipital articulation. Major intracranial vascular flow voids are preserved.  IMPRESSION: 1. No acute intracranial abnormality. 2. Moderate chronic small vessel ischemic disease with multiple chronic lacunar infarcts as above.   Electronically Signed   By: Sebastian Ache   On: 08/22/2014 20:36    Microbiology: Recent Results (from the past 240 hour(s))  MRSA PCR Screening     Status: None   Collection Time: 08/22/14  8:36 PM  Result Value Ref Range Status   MRSA by PCR NEGATIVE NEGATIVE Final    Comment:        The GeneXpert MRSA Assay (FDA approved for NASAL specimens only), is one component of a comprehensive MRSA colonization surveillance program. It is not intended to diagnose MRSA infection nor to guide or monitor treatment for MRSA infections.      Labs: Basic Metabolic Panel:  Recent Labs Lab 08/22/14 1619 08/22/14 1627  NA 139 142  K 5.0 5.0  CL 109 107  CO2 24  --   GLUCOSE 95 92  BUN 18 26*  CREATININE 2.22* 2.40*  CALCIUM 8.6*  --    Liver Function Tests:  Recent Labs Lab 08/22/14 1619  AST 27  ALT 22  ALKPHOS 50  BILITOT 0.8  PROT 6.9  ALBUMIN 3.3*   No results for input(s): LIPASE, AMYLASE in the last 168 hours. No results for input(s): AMMONIA in the last 168 hours. CBC:  Recent Labs Lab 08/22/14 1619 08/22/14 1627  WBC 5.1  --   NEUTROABS 1.9  --   HGB 13.6 15.3  HCT 41.3 45.0  MCV 87.3  --   PLT 210  --    Cardiac Enzymes: No results for input(s): CKTOTAL, CKMB, CKMBINDEX, TROPONINI in the last 168 hours. BNP: BNP (last 3 results) No results for input(s): BNP in the last 8760 hours.  ProBNP (last 3 results) No results for input(s): PROBNP in the last 8760 hours.  CBG:  Recent Labs Lab 08/22/14 1803  GLUCAP 93        Signed:  Jani Ploeger  [facility] 08/23/2014, 9:22 AM

## 2014-08-23 NOTE — Discharge Instructions (Signed)
DASH Eating Plan °DASH stands for "Dietary Approaches to Stop Hypertension." The DASH eating plan is a healthy eating plan that has been shown to reduce high blood pressure (hypertension). Additional health benefits may include reducing the risk of type 2 diabetes mellitus, heart disease, and stroke. The DASH eating plan may also help with weight loss. °WHAT DO I NEED TO KNOW ABOUT THE DASH EATING PLAN? °For the DASH eating plan, you will follow these general guidelines: °· Choose foods with a percent daily value for sodium of less than 5% (as listed on the food label). °· Use salt-free seasonings or herbs instead of table salt or sea salt. °· Check with your health care provider or pharmacist before using salt substitutes. °· Eat lower-sodium products, often labeled as "lower sodium" or "no salt added." °· Eat fresh foods. °· Eat more vegetables, fruits, and low-fat dairy products. °· Choose whole grains. Look for the word "whole" as the first word in the ingredient list. °· Choose fish and skinless chicken or turkey more often than red meat. Limit fish, poultry, and meat to 6 oz (170 g) each day. °· Limit sweets, desserts, sugars, and sugary drinks. °· Choose heart-healthy fats. °· Limit cheese to 1 oz (28 g) per day. °· Eat more home-cooked food and less restaurant, buffet, and fast food. °· Limit fried foods. °· Cook foods using methods other than frying. °· Limit canned vegetables. If you do use them, rinse them well to decrease the sodium. °· When eating at a restaurant, ask that your food be prepared with less salt, or no salt if possible. °WHAT FOODS CAN I EAT? °Seek help from a dietitian for individual calorie needs. °Grains °Whole grain or whole wheat bread. Brown rice. Whole grain or whole wheat pasta. Quinoa, bulgur, and whole grain cereals. Low-sodium cereals. Corn or whole wheat flour tortillas. Whole grain cornbread. Whole grain crackers. Low-sodium crackers. °Vegetables °Fresh or frozen vegetables  (raw, steamed, roasted, or grilled). Low-sodium or reduced-sodium tomato and vegetable juices. Low-sodium or reduced-sodium tomato sauce and paste. Low-sodium or reduced-sodium canned vegetables.  °Fruits °All fresh, canned (in natural juice), or frozen fruits. °Meat and Other Protein Products °Ground beef (85% or leaner), grass-fed beef, or beef trimmed of fat. Skinless chicken or turkey. Ground chicken or turkey. Pork trimmed of fat. All fish and seafood. Eggs. Dried beans, peas, or lentils. Unsalted nuts and seeds. Unsalted canned beans. °Dairy °Low-fat dairy products, such as skim or 1% milk, 2% or reduced-fat cheeses, low-fat ricotta or cottage cheese, or plain low-fat yogurt. Low-sodium or reduced-sodium cheeses. °Fats and Oils °Tub margarines without trans fats. Light or reduced-fat mayonnaise and salad dressings (reduced sodium). Avocado. Safflower, olive, or canola oils. Natural peanut or almond butter. °Other °Unsalted popcorn and pretzels. °The items listed above may not be a complete list of recommended foods or beverages. Contact your dietitian for more options. °WHAT FOODS ARE NOT RECOMMENDED? °Grains °White bread. White pasta. White rice. Refined cornbread. Bagels and croissants. Crackers that contain trans fat. °Vegetables °Creamed or fried vegetables. Vegetables in a cheese sauce. Regular canned vegetables. Regular canned tomato sauce and paste. Regular tomato and vegetable juices. °Fruits °Dried fruits. Canned fruit in light or heavy syrup. Fruit juice. °Meat and Other Protein Products °Fatty cuts of meat. Ribs, chicken wings, bacon, sausage, bologna, salami, chitterlings, fatback, hot dogs, bratwurst, and packaged luncheon meats. Salted nuts and seeds. Canned beans with salt. °Dairy °Whole or 2% milk, cream, half-and-half, and cream cheese. Whole-fat or sweetened yogurt. Full-fat   cheeses or blue cheese. Nondairy creamers and whipped toppings. Processed cheese, cheese spreads, or cheese  curds. °Condiments °Onion and garlic salt, seasoned salt, table salt, and sea salt. Canned and packaged gravies. Worcestershire sauce. Tartar sauce. Barbecue sauce. Teriyaki sauce. Soy sauce, including reduced sodium. Steak sauce. Fish sauce. Oyster sauce. Cocktail sauce. Horseradish. Ketchup and mustard. Meat flavorings and tenderizers. Bouillon cubes. Hot sauce. Tabasco sauce. Marinades. Taco seasonings. Relishes. °Fats and Oils °Butter, stick margarine, lard, shortening, ghee, and bacon fat. Coconut, palm kernel, or palm oils. Regular salad dressings. °Other °Pickles and olives. Salted popcorn and pretzels. °The items listed above may not be a complete list of foods and beverages to avoid. Contact your dietitian for more information. °WHERE CAN I FIND MORE INFORMATION? °National Heart, Lung, and [employer] Institute: www.nhlbi.nih.gov/health/health-topics/topics/dash/ °Document Released: 02/11/2011 Document Revised: 07/09/2013 Document Reviewed: 12/27/2012 °ExitCare® Patient Information ©2015 [employer]. This information is not intended to replace advice given to you by your health care provider. Make sure you discuss any questions you have with your health care provider. ° °

## 2014-08-23 NOTE — Progress Notes (Signed)
CSW (Clinical Child psychotherapist) aware of consult. CSW will speak with pt about resources and will notify RNCM that pt does not have a PCP.   Richard Barnett, LCSWA [phone]

## 2014-08-24 LAB — HEMOGLOBIN A1C
Hgb A1c MFr Bld: 6.5 % — ABNORMAL HIGH (ref 4.8–5.6)
Mean Plasma Glucose: 140 mg/dL

## 2014-08-26 ENCOUNTER — Inpatient Hospital Stay: Payer: Medicaid Other | Admitting: Family Medicine

## 2014-08-28 ENCOUNTER — Encounter (HOSPITAL_COMMUNITY): Payer: Self-pay | Admitting: Nurse Practitioner

## 2014-08-28 ENCOUNTER — Emergency Department (HOSPITAL_COMMUNITY): Payer: Medicaid Other

## 2014-08-28 ENCOUNTER — Inpatient Hospital Stay: Payer: Medicaid Other | Admitting: Family Medicine

## 2014-08-28 ENCOUNTER — Emergency Department (HOSPITAL_COMMUNITY)
Admission: EM | Admit: 2014-08-28 | Discharge: 2014-08-29 | Discharge status: Against Medical Advice | Payer: Medicaid Other | Attending: Emergency Medicine | Admitting: Emergency Medicine

## 2014-08-28 DIAGNOSIS — R51 Headache: Secondary | ICD-10-CM | POA: Diagnosis not present

## 2014-08-28 DIAGNOSIS — Z9861 Coronary angioplasty status: Secondary | ICD-10-CM | POA: Diagnosis not present

## 2014-08-28 DIAGNOSIS — I509 Heart failure, unspecified: Secondary | ICD-10-CM | POA: Insufficient documentation

## 2014-08-28 DIAGNOSIS — R45851 Suicidal ideations: Secondary | ICD-10-CM

## 2014-08-28 DIAGNOSIS — I252 Old myocardial infarction: Secondary | ICD-10-CM | POA: Insufficient documentation

## 2014-08-28 DIAGNOSIS — I2510 Atherosclerotic heart disease of native coronary artery without angina pectoris: Secondary | ICD-10-CM | POA: Diagnosis not present

## 2014-08-28 DIAGNOSIS — G8929 Other chronic pain: Secondary | ICD-10-CM | POA: Insufficient documentation

## 2014-08-28 DIAGNOSIS — N183 Chronic kidney disease, stage 3 unspecified: Secondary | ICD-10-CM

## 2014-08-28 DIAGNOSIS — Z86718 Personal history of other venous thrombosis and embolism: Secondary | ICD-10-CM | POA: Insufficient documentation

## 2014-08-28 DIAGNOSIS — Z0289 Encounter for other administrative examinations: Secondary | ICD-10-CM | POA: Insufficient documentation

## 2014-08-28 DIAGNOSIS — M1990 Unspecified osteoarthritis, unspecified site: Secondary | ICD-10-CM | POA: Insufficient documentation

## 2014-08-28 DIAGNOSIS — Z87891 Personal history of nicotine dependence: Secondary | ICD-10-CM | POA: Insufficient documentation

## 2014-08-28 DIAGNOSIS — I129 Hypertensive chronic kidney disease with stage 1 through stage 4 chronic kidney disease, or unspecified chronic kidney disease: Secondary | ICD-10-CM | POA: Diagnosis present

## 2014-08-28 DIAGNOSIS — Z880 Allergy status to penicillin: Secondary | ICD-10-CM | POA: Insufficient documentation

## 2014-08-28 DIAGNOSIS — F329 Major depressive disorder, single episode, unspecified: Secondary | ICD-10-CM | POA: Diagnosis not present

## 2014-08-28 DIAGNOSIS — I10 Essential (primary) hypertension: Secondary | ICD-10-CM

## 2014-08-28 DIAGNOSIS — R011 Cardiac murmur, unspecified: Secondary | ICD-10-CM | POA: Diagnosis not present

## 2014-08-28 DIAGNOSIS — Z79899 Other long term (current) drug therapy: Secondary | ICD-10-CM | POA: Insufficient documentation

## 2014-08-28 DIAGNOSIS — E780 Pure hypercholesterolemia: Secondary | ICD-10-CM | POA: Diagnosis not present

## 2014-08-28 DIAGNOSIS — Z008 Encounter for other general examination: Secondary | ICD-10-CM

## 2014-08-28 DIAGNOSIS — Z7982 Long term (current) use of aspirin: Secondary | ICD-10-CM | POA: Insufficient documentation

## 2014-08-28 LAB — CBC WITH DIFFERENTIAL/PLATELET
Basophils Absolute: 0.1 10*3/uL (ref 0.0–0.1)
Basophils Relative: 1 % (ref 0–1)
Eosinophils Absolute: 0.2 10*3/uL (ref 0.0–0.7)
Eosinophils Relative: 4 % (ref 0–5)
HCT: 40.6 % (ref 39.0–52.0)
Hemoglobin: 13.8 g/dL (ref 13.0–17.0)
LYMPHS ABS: 2.9 10*3/uL (ref 0.7–4.0)
LYMPHS PCT: 52 % — AB (ref 12–46)
MCH: 29.1 pg (ref 26.0–34.0)
MCHC: 34 g/dL (ref 30.0–36.0)
MCV: 85.5 fL (ref 78.0–100.0)
Monocytes Absolute: 0.7 10*3/uL (ref 0.1–1.0)
Monocytes Relative: 12 % (ref 3–12)
NEUTROS PCT: 31 % — AB (ref 43–77)
Neutro Abs: 1.7 10*3/uL (ref 1.7–7.7)
Platelets: 272 10*3/uL (ref 150–400)
RBC: 4.75 MIL/uL (ref 4.22–5.81)
RDW: 14.3 % (ref 11.5–15.5)
WBC: 5.5 10*3/uL (ref 4.0–10.5)

## 2014-08-28 LAB — SALICYLATE LEVEL: Salicylate Lvl: <4 mg/dL (ref 2.8–30.0)

## 2014-08-28 LAB — COMPREHENSIVE METABOLIC PANEL
ALBUMIN: 3.5 g/dL (ref 3.5–5.0)
ALT: 18 U/L (ref 17–63)
AST: 20 U/L (ref 15–41)
Alkaline Phosphatase: 52 U/L (ref 38–126)
Anion gap: 9 (ref 5–15)
BILIRUBIN TOTAL: 0.7 mg/dL (ref 0.3–1.2)
BUN: 27 mg/dL — AB (ref 6–20)
CHLORIDE: 106 mmol/L (ref 101–111)
CO2: 26 mmol/L (ref 22–32)
CREATININE: 2.42 mg/dL — AB (ref 0.61–1.24)
Calcium: 8.9 mg/dL (ref 8.9–10.3)
GFR, EST AFRICAN AMERICAN: 32 mL/min — AB (ref >60)
GFR, EST NON AFRICAN AMERICAN: 28 mL/min — AB (ref >60)
Glucose, Bld: 100 mg/dL — ABNORMAL HIGH (ref 65–99)
Potassium: 4.1 mmol/L (ref 3.5–5.1)
Sodium: 141 mmol/L (ref 135–145)
TOTAL PROTEIN: 7.2 g/dL (ref 6.5–8.1)

## 2014-08-28 LAB — RAPID URINE DRUG SCREEN, HOSP PERFORMED
AMPHETAMINES: NOT DETECTED
BENZODIAZEPINES: NOT DETECTED
Barbiturates: NOT DETECTED
COCAINE: NOT DETECTED
OPIATES: NOT DETECTED
Tetrahydrocannabinol: NOT DETECTED

## 2014-08-28 LAB — ACETAMINOPHEN LEVEL

## 2014-08-28 LAB — ETHANOL: Alcohol, Ethyl (B): <5 mg/dL (ref <5)

## 2014-08-28 MED ORDER — ATORVASTATIN CALCIUM 40 MG PO TABS
40.0000 mg | ORAL_TABLET | Freq: Every day | ORAL | Status: DC
  Administered 2014-08-28: 40 mg via ORAL
  Filled 2014-08-28 (×2): qty 1

## 2014-08-28 MED ORDER — ACETAMINOPHEN 325 MG PO TABS: 650.0000 mg | ORAL_TABLET | Freq: Four times a day (QID) | ORAL | Status: DC | PRN

## 2014-08-28 MED ORDER — ASPIRIN EC 81 MG PO TBEC: 81.0000 mg | DELAYED_RELEASE_TABLET | Freq: Every day | ORAL | Status: DC

## 2014-08-28 MED ORDER — LORAZEPAM 1 MG PO TABS
1.0000 mg | ORAL_TABLET | Freq: Three times a day (TID) | ORAL | Status: DC | PRN
  Administered 2014-08-28: 1 mg via ORAL
  Filled 2014-08-28 (×2): qty 1

## 2014-08-28 MED ORDER — AMLODIPINE BESYLATE 5 MG PO TABS
10.0000 mg | ORAL_TABLET | Freq: Every day | ORAL | Status: DC
  Filled 2014-08-28: qty 2

## 2014-08-28 MED ORDER — ZOLPIDEM TARTRATE 5 MG PO TABS
5.0000 mg | ORAL_TABLET | Freq: Every evening | ORAL | Status: DC | PRN
  Administered 2014-08-28: 5 mg via ORAL
  Filled 2014-08-28: qty 1

## 2014-08-28 MED ORDER — ONDANSETRON HCL 4 MG PO TABS: 4.0000 mg | ORAL_TABLET | Freq: Three times a day (TID) | ORAL | Status: DC | PRN

## 2014-08-28 MED ORDER — IBUPROFEN 400 MG PO TABS: 600.0000 mg | ORAL_TABLET | Freq: Three times a day (TID) | ORAL | Status: DC | PRN

## 2014-08-28 MED ORDER — CLONIDINE HCL 0.2 MG PO TABS
0.2000 mg | ORAL_TABLET | Freq: Two times a day (BID) | ORAL | Status: DC
  Administered 2014-08-28: 0.2 mg via ORAL
  Filled 2014-08-28 (×2): qty 1

## 2014-08-28 MED ORDER — ALUM & MAG HYDROXIDE-SIMETH 200-200-20 MG/5ML PO SUSP: 30.0000 mL | ORAL | Status: DC | PRN

## 2014-08-28 MED ORDER — METOPROLOL TARTRATE 25 MG PO TABS
50.0000 mg | ORAL_TABLET | Freq: Two times a day (BID) | ORAL | Status: DC
  Administered 2014-08-28: 50 mg via ORAL
  Filled 2014-08-28 (×2): qty 2

## 2014-08-28 NOTE — ED Notes (Signed)
Ordered heart healthy dinner tray.  

## 2014-08-28 NOTE — Progress Notes (Addendum)
Per Delorise Shiner at [facility], geriatric beds are available. Patient needs an EKG and chestxray in order to be referred to [facility]. Patient's nurse informed.  Melbourne Abts, LCSWA Disposition staff 08/28/2014 5:53 PM

## 2014-08-28 NOTE — ED Notes (Signed)
PT REFUSED LABS °

## 2014-08-28 NOTE — Progress Notes (Signed)
Patient was referred for IP treatment at: [facility] - per Nicholos Johns, fax referral. [facility] - per intake, 1 bed open [facility] - per intake, fax referral for review. [facility] - per intake, fax referral for waitlist. [facility] - per Cricket, fax it, might have one more bed. [facility] - per [place], fax it, will hold on to it till am, no beds at the moment. [facility] - per Dot [place], fax for review.  Declined at: [facility] - due to [facility] [place] - due to [employer] - doesn't take [employer] - no answer  At capacity: [facility]  CSW will continue to seek placement.  Melbourne Abts, LCSWA Disposition staff 08/28/2014 11:19 PM

## 2014-08-28 NOTE — BH Assessment (Addendum)
Assessment Note  Patient is a poor historian.  Patient is a 59 year old [place] that lives alone in [place] and receives a disability check.  Patient reports that he and does not have any family in the area. Patient reports that he does have family in [place] but he does not have any phone numbers of any family members.  Patient denies having any friend in [place] that could be contacted for collateral information.  Patient reports SI because the, "Health Department will not allow me to come back and see the doctor".  Patient reports that he had an appointment at the [facility] today and he was 10 minutes late so they would not see him. Patient reports that he became angry because the Health Department told him that he cannot come back due to too many missed appointments.    Documentation in the epic chart reports that patient stating that he just wanted to, "go home and die".  During the assessment the patient continued to state that he wanted to kill himself.  Patient reports that he would hang himself, cut his wrist or overdose.   Patient reports prior attempts to kill himself in the past but he could not remember the date or what he did in an attempt to kill himself.  Patient reports that he does not remember the when or where he was hospitalized in the past for attempting to kill himself.   Patient reports seeing ghosts and people in his room since he was a young adult. Patient reports that he stopped taking his psychiatric medication a long time ago.   He was not able to remember how long it has been since he has taken his psychiatric medication.     Patient denied physical, sexual or emotional abuse.  Patient denies HI.  Patient denies a history of violence.   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   Patient reports that he does not remember how long he has been using or the amount that he uses daily.  Patient  denies prior inpatient or outpatient substance abuse treatment.    Axis I: Paranoid Schizophrenia Axis II: Deferred Axis III:  Past Medical History  Diagnosis Date  . Hypertension   . Hypercholesterolemia   . Heart murmur   . CHF (congestive heart failure)   . Anginal pain   . MI (myocardial infarction)     "I've had couple when I was asleep; a couple when I was awake" (08/22/2014)  . DVT (deep venous thrombosis) ?    RLE  . Daily headache   . Depression     "I always get that" (08/22/2014)  . Chronic kidney disease     "from my blood pressure problems" (08/22/2014)  . Chronic renal insufficiency     [name] 08/22/2014  . Arthritis     "right leg" (08/22/2014)   Axis IV: economic problems, housing problems, occupational problems, other psychosocial or environmental problems, problems related to social environment, problems with access to health care services and problems with primary support group Axis V: 21-30 behavior considerably influenced by delusions or hallucinations OR serious impairment in judgment, communication OR inability to function in almost all areas  Past Medical History:  Past Medical History  Diagnosis Date  . Hypertension   . Hypercholesterolemia   . Heart murmur   . CHF (congestive heart failure)   . Anginal pain   . MI (myocardial  infarction)     "I've had couple when I was asleep; a couple when I was awake" (08/22/2014)  . DVT (deep venous thrombosis) ?    RLE  . Daily headache   . Depression     "I always get that" (08/22/2014)  . Chronic kidney disease     "from my blood pressure problems" (08/22/2014)  . Chronic renal insufficiency     [name] 08/22/2014  . Arthritis     "right leg" (08/22/2014)    Past Surgical History  Procedure Laterality Date  . Coronary angioplasty with stent placement      "1 + 1"    Family History: History reviewed. No pertinent family history.  Social History:  reports that he has quit smoking. His smoking use  included Cigarettes. He has a 120 pack-year smoking history. He has never used smokeless tobacco. He reports that he drinks alcohol. He reports that he uses illicit drugs ("Crack" cocaine and Other-see comments).  Additional Social History:  Alcohol / Drug Use History of alcohol / drug use?: Yes Longest period of sobriety (when/how long): Patient reports that he cannot remember. Negative Consequences of Use: Personal relationships, Financial, Work / School Withdrawal Symptoms:  (Patient denies any withdrawl symptoms) Substance #1 Name of Substance 1: Alcohol 1 - Age of First Use: Patient repots that he does not remember. 1 - Amount (size/oz): Gallons 1 - Frequency: Daily 1 - Duration: Patient repots that he does not remember. 1 - Last Use / Amount: Patient repots that he does not remember. (Patient refused labs. ) Substance #2 Name of Substance 2: Cocaine 2 - Age of First Use: Patient repots that he does not remember. 2 - Amount (size/oz): Patient repots that he does not remember. 2 - Frequency: Patient repots that he does not remember. 2 - Duration: Patient repots that he does not remember. 2 - Last Use / Amount: Patient repots that he does not remember. (Patient refused labs.)  CIWA: CIWA-Ar BP: 162/93 mmHg Pulse Rate: (!) 52 COWS:    Allergies:  Allergies  Allergen Reactions  . Penicillins Swelling and Rash    Home Medications:  (Not in a hospital admission)  OB/GYN Status:  No LMP for Jedaiah patient.  General Assessment Data Location of Assessment: [facility] ED TTS Assessment: In system Is this a Tele or Face-to-Face Assessment?: Face-to-Face Is this an Initial Assessment or a Re-assessment for this encounter?: Initial Assessment Marital status: Single Maiden name: NA Is patient pregnant?: No Pregnancy Status: No Living Arrangements: Alone Can pt return to current living arrangement?: Yes Admission Status: Voluntary Is patient capable of signing voluntary admission?:  Yes Referral Source: Self/Family/Friend Insurance type: Medicaid  Medical Screening Exam [facility] Walk-in ONLY) Medical Exam completed: Yes  Crisis Care Plan Living Arrangements: Alone Name of Psychiatrist: None Reported Name of Therapist: None Reported  Education Status Is patient currently in school?: No Current Grade: NA Highest grade of school patient has completed: NA Name of school: NA Contact person: NA  Risk to self with the past 6 months Suicidal Ideation: Yes-Currently Present Has patient been a risk to self within the past 6 months prior to admission? : Yes Suicidal Intent: Yes-Currently Present Has patient had any suicidal intent within the past 6 months prior to admission? : Yes Is patient at risk for suicide?: Yes Suicidal Plan?: Yes-Currently Present Has patient had any suicidal plan within the past 6 months prior to admission? : Yes Specify Current Suicidal Plan: Cut his wrist, hang himself, overdose Access  to Means: Yes Specify Access to Suicidal Means: rope, pills, anything sharp What has been your use of drugs/alcohol within the last 12 months?: Alcohol and cocaine Previous Attempts/Gestures: Yes How many times?:  (Patient reports that he is not able to remember ) Other Self Harm Risks: None Reported Triggers for Past Attempts: Unpredictable Intentional Self Injurious Behavior: None Family Suicide History: No Recent stressful life event(s): Conflict (Comment) (The health dept told him that he could not come b) Persecutory voices/beliefs?: Yes Depression: Yes Depression Symptoms: Despondent, Insomnia, Tearfulness, Isolating, Fatigue, Guilt, Loss of interest in usual pleasures, Feeling worthless/self pity, Feeling angry/irritable Substance abuse history and/or treatment for substance abuse?: Yes Suicide prevention information given to non-admitted patients: Yes  Risk to Others within the past 6 months Homicidal Ideation: No Does patient have any lifetime  risk of violence toward others beyond the six months prior to admission? : No Thoughts of Harm to Others: No Current Homicidal Intent: No Current Homicidal Plan: No Access to Homicidal Means: No Identified Victim: NA History of harm to others?: No Assessment of Violence: None Noted Violent Behavior Description: NA Does patient have access to weapons?: No Criminal Charges Pending?: No Does patient have a court date: No Is patient on probation?: No  Psychosis Hallucinations: Visual Delusions: None noted  Mental Status Report Appearance/Hygiene: Disheveled, In scrubs Eye Contact: Poor Motor Activity: Freedom of movement, Agitation Speech: Soft, Slow Level of Consciousness: Alert, Restless, Irritable Mood: Depressed, Suspicious, Irritable Affect: Anxious, Fearful Anxiety Level: Minimal Thought Processes: Flight of Ideas Judgement: Unimpaired Orientation: Person, Place, Time, Situation Obsessive Compulsive Thoughts/Behaviors: None  Cognitive Functioning Concentration: Decreased Memory: Recent Impaired, Remote Impaired IQ: Average Insight: Poor Impulse Control: Poor Appetite: Fair Weight Loss: 0 Weight Gain: 0 Sleep: Decreased Total Hours of Sleep: 4 Vegetative Symptoms: Decreased grooming, Staying in bed  ADLScreening [facility] Assessment Services) Patient's cognitive ability adequate to safely complete daily activities?: Yes Patient able to express need for assistance with ADLs?: Yes Independently performs ADLs?: Yes (appropriate for developmental age)  Prior Inpatient Therapy Prior Inpatient Therapy: Yes Prior Therapy Dates: Patient reports that he is unable to remember  Prior Therapy Facilty/Provider(s):  (Patient reports that he is unable to remember ) Reason for Treatment: Patient reports that he is unable to remember   Prior Outpatient Therapy Prior Outpatient Therapy: Yes Prior Therapy Dates: 3 months ago Prior Therapy Facilty/Provider(s): Patient reports that  he is unable to remember  (Patient thinks he saw a doctor in [place]) Reason for Treatment: Medication Management  Does patient have an ACCT team?: No Does patient have Intensive In-House Services?  : No Does patient have [facility] services? : No Does patient have P4CC services?: No  ADL Screening (condition at time of admission) Patient's cognitive ability adequate to safely complete daily activities?: Yes Is the patient deaf or have difficulty hearing?: No Does the patient have difficulty seeing, even when wearing glasses/contacts?: No Does the patient have difficulty concentrating, remembering, or making decisions?: Yes Patient able to express need for assistance with ADLs?: Yes Does the patient have difficulty dressing or bathing?: No Independently performs ADLs?: Yes (appropriate for developmental age) Does the patient have difficulty walking or climbing stairs?: No Weakness of Legs: None Weakness of Arms/Hands: None  Home Assistive Devices/Equipment Home Assistive Devices/Equipment: None    Abuse/Neglect Assessment (Assessment to be complete while patient is alone) Physical Abuse: Denies Verbal Abuse: Denies Sexual Abuse: Denies Exploitation of patient/patient's resources: Denies Self-Neglect: Denies Values / Beliefs Cultural Requests During Hospitalization: None Spiritual  Requests During Hospitalization: None Consults Spiritual Care Consult Needed: No Social Work Consult Needed: No Merchant navy officer (For Healthcare) Does patient have an advance directive?: No Would patient like information on creating an advanced directive?: No - patient declined information    Additional Information 1:1 In Past 12 Months?: No CIRT Risk: No Elopement Risk: Yes Does patient have medical clearance?: No     Disposition: Per [place], NP - patient meets criteria for inpatient hospitalization once the patient is medically cleared.  CSW Public house manager) will seek placement. Disposition Initial  Assessment Completed for this Encounter: Yes Disposition of Patient: Other dispositions Other disposition(s): Other (Comment) (Pending psych disposition. )  On Site Evaluation by:   Reviewed with Physician:    Phillip Heal LaVerne 08/28/2014 4:12 PM

## 2014-08-28 NOTE — ED Provider Notes (Signed)
CSN: [id number]     Arrival date & time 08/28/14  1303 History   First MD Initiated Contact with Patient 08/28/14 1507     Chief Complaint  Patient presents with  . Hypertension     (Consider location/radiation/quality/duration/timing/severity/associated sxs/prior Treatment) HPI Comments: Nkosi Waddy is a 59 y.o. [place] with a PMHx of HTN, HLD, CHF, ?MI, ?DVT, depression, CKD, and R leg arthritis, who presents to the ED for "a checkup". He states that he missed his appointment at [facility] and that because the ER was close he came here. He has no complaints at this time aside from a headache that he states is "always there" in the same as his typical headache. He reports that this headache was present upon discharge. He refuses to describe the headache any further. He denies any other complaints including fevers, chills, chest pain, shortness of breath, cough, wheezing, leg swelling, abdominal pain, nausea, vomiting, diarrhea, constipation, dysuria, hematuria, numbness, tingling, weakness, vision changes, lightheadedness, or dizziness. He reports that he has been compliant on his medications since he was discharged on 6/17. When advised to simply reschedule his appointment, he refuses to do this stating that he has to take 2 buses to get there. Discussed the option of giving him a list of providers which she also refused, and then stated that he would rather "go home and die". He reports that he is suicidal without a plan but threatens to commit suicide if he is discharged.  Patient is a 59 y.o. [place] presenting with hypertension. The history is provided by the patient. No language interpreter was used.  Hypertension This is a chronic problem. The current episode started more than 1 year ago. The problem occurs constantly. The problem has been rapidly improving. Associated symptoms include headaches (chronic, unchanged). Pertinent negatives include no abdominal pain, arthralgias, chest  pain, chills, coughing, fever, myalgias, nausea, numbness, vomiting or weakness. Nothing aggravates the symptoms. He has tried nothing for the symptoms. The treatment provided no relief.    Past Medical History  Diagnosis Date  . Hypertension   . Hypercholesterolemia   . Heart murmur   . CHF (congestive heart failure)   . Anginal pain   . MI (myocardial infarction)     "I've had couple when I was asleep; a couple when I was awake" (08/22/2014)  . DVT (deep venous thrombosis) ?    RLE  . Daily headache   . Depression     "I always get that" (08/22/2014)  . Chronic kidney disease     "from my blood pressure problems" (08/22/2014)  . Chronic renal insufficiency     [name] 08/22/2014  . Arthritis     "right leg" (08/22/2014)   Past Surgical History  Procedure Laterality Date  . Coronary angioplasty with stent placement      "1 + 1"   History reviewed. No pertinent family history. History  Substance Use Topics  . Smoking status: Former Smoker -- 3.00 packs/day for 40 years    Types: Cigarettes  . Smokeless tobacco: Never Used     Comment: "quit smoking cigarettes in ~ 2011"  . Alcohol Use: Yes     Comment: 08/22/2014 "stopped in ~ 2011"    Review of Systems  Constitutional: Negative for fever and chills.  Eyes: Negative for visual disturbance.  Respiratory: Negative for cough, shortness of breath and wheezing.   Cardiovascular: Negative for chest pain and leg swelling.  Gastrointestinal: Negative for nausea, vomiting, abdominal pain, diarrhea and  constipation.  Genitourinary: Negative for dysuria and hematuria.  Musculoskeletal: Negative for myalgias and arthralgias.  Skin: Negative for color change.  Allergic/Immunologic: Negative for immunocompromised state.  Neurological: Positive for headaches (chronic, unchanged). Negative for dizziness, weakness, light-headedness and numbness.  Psychiatric/Behavioral: Negative for confusion.   10 Systems reviewed and are negative for  acute change except as noted in the HPI.    Allergies  Penicillins  Home Medications   Prior to Admission medications   Medication Sig Start Date End Date Taking? Authorizing Provider  amLODipine (NORVASC) 10 MG tablet Take 1 tablet (10 mg total) by mouth daily. 08/23/14   Nishant Dhungel, MD  aspirin 81 MG EC tablet Take 1 tablet (81 mg total) by mouth daily. 05/28/14   Azalia Bilis, MD  atorvastatin (LIPITOR) 40 MG tablet Take 1 tablet (40 mg total) by mouth daily at 6 PM. 08/23/14   Nishant Dhungel, MD  cloNIDine (CATAPRES) 0.2 MG tablet Take 1 tablet (0.2 mg total) by mouth 2 (two) times daily. 08/23/14   Nishant Dhungel, MD  colchicine 0.6 MG tablet Take 0.6 mg by mouth as needed (for gout flareups).    Historical Provider, MD  metoprolol (LOPRESSOR) 50 MG tablet Take 1 tablet (50 mg total) by mouth 2 (two) times daily. 08/23/14   Nishant Dhungel, MD   BP 137/81 mmHg  Pulse 53  Temp(Src) 97.6 F (36.4 C) (Oral)  Resp 15  SpO2 97% Physical Exam  Constitutional: He is oriented to person, place, and time. Vital signs are normal. He appears well-developed and well-nourished.  Non-toxic appearance. No distress.  Afebrile, nontoxic, NAD  HENT:  Head: Normocephalic and atraumatic.  Mouth/Throat: Oropharynx is clear and moist and mucous membranes are normal.  Eyes: Conjunctivae and EOM are normal. Pupils are equal, round, and reactive to light. Right eye exhibits no discharge. Left eye exhibits no discharge.  PERRL, EOMI, no nystagmus, no visual field deficits   Neck: Normal range of motion. Neck supple.  No meningismus  Cardiovascular: Normal rate, regular rhythm, normal heart sounds and intact distal pulses.  Exam reveals no gallop and no friction rub.   No murmur heard. Pulmonary/Chest: Effort normal and breath sounds normal. No respiratory distress. He has no decreased breath sounds. He has no wheezes. He has no rhonchi. He has no rales.  Abdominal: Soft. Normal appearance and bowel  sounds are normal. He exhibits no distension. There is no tenderness. There is no rigidity, no rebound, no guarding, no CVA tenderness, no tenderness at McBurney's point and negative Murphy's sign.  Musculoskeletal: Normal range of motion.  Neurological: He is alert and oriented to person, place, and time. He has normal strength. No cranial nerve deficit or sensory deficit. He displays a negative Romberg sign. Coordination and gait normal. GCS eye subscore is 4. GCS verbal subscore is 5. GCS motor subscore is 6.  CN 2-12 grossly intact A&O x4 GCS 15 Sensation and strength intact Gait nonataxic Coordination with finger-to-nose WNL Neg romberg, neg pronator drift   Skin: Skin is warm, dry and intact. No rash noted.  Psychiatric: He exhibits a depressed mood. He expresses suicidal ideation. He expresses no suicidal plans.  +SI without plan, somewhat depressed mood.  Nursing note and vitals reviewed.   ED Course  Procedures (including critical care time) Labs Review Labs Reviewed  CBC WITH DIFFERENTIAL/PLATELET - Abnormal; Notable for the following:    Neutrophils Relative % 31 (*)    Lymphocytes Relative 52 (*)    All other components within  normal limits  COMPREHENSIVE METABOLIC PANEL - Abnormal; Notable for the following:    Glucose, Bld 100 (*)    BUN 27 (*)    Creatinine, Ser 2.42 (*)    GFR calc non Af Amer 28 (*)    GFR calc Af Amer 32 (*)    All other components within normal limits  ACETAMINOPHEN LEVEL - Abnormal; Notable for the following:    Acetaminophen (Tylenol), Serum <10 (*)    All other components within normal limits  ETHANOL  SALICYLATE LEVEL  URINE RAPID DRUG SCREEN, HOSP PERFORMED    Imaging Review Dg Chest 2 View  08/28/2014   CLINICAL DATA:  Medical clearance first psychiatric admission. Coronary artery disease. Chronic kidney disease.  EXAM: CHEST  2 VIEW  COMPARISON:  08/22/2014  FINDINGS: Low lung volumes are seen on today's study with mild bibasilar  atelectasis. No evidence of pulmonary consolidation or edema. No evidence of pleural effusion. Heart size is within normal limits and stable allowing for low lung volumes.  IMPRESSION: Low lung volumes with mild bibasilar atelectasis.   Electronically Signed   By: Myles Rosenthal M.D.   On: 08/28/2014 19:03     EKG Interpretation   Date/Time:  Wednesday August 28 2014 18:27:52 EDT Ventricular Rate:  53 PR Interval:  166 QRS Duration: 102 QT Interval:  468 QTC Calculation: 439 R Axis:   12 Text Interpretation:  Sinus rhythm Probable left atrial enlargement Left  ventricular hypertrophy Nonspecific T abnormalities, diffuse leads  Baseline wander in lead(s) V3 No significant change since last tracing  Confirmed by ZACKOWSKI  MD, SCOTT [phone]) on 08/28/2014 6:35:00 PM      MDM   Final diagnoses:  Medical clearance for psychiatric admission  Suicidal ideation  CKD (chronic kidney disease), stage III  HTN (hypertension), benign    59 y.o. [place] here for "checkup" because he missed his appointment at [facility]. He denies any new complaints today, states that he has his typical headache which is "always there". Nonfocal neuro exam. BP improved today, now 137/81. Discussed with patient that he should follow-up and reschedule his appointment, and he stated that he would rather "go home and die" and he refuses to see that doctor again. He states that he does not want a list of providers in the area. He endorses suicidal ideations with no plan therefore psychiatric evaluation will be obtained. Will get screening labs.  6:22 PM Psychiatric team calling stating that he might be accepted to [facility] but needs CXR and EKG first. Will order these. So far labs show chronically elevated BUN/Cr at baseline, CBC unremarkable. EtOH, salicylate, and acetaminophen levels WNL. UDS pending but overall pt is medically cleared. Will await CXR and EKG.  7:14 PM EKG unchanged from prior, CXR with low lung volumes and  bibasilar atelectasis likely from poor inspiratory effort. UDS neg. Pt medically cleared. Please see [facility] notes for further documentation of care and dispo.  BP 165/91 mmHg  Pulse 54  Temp(Src) 97.6 F (36.4 C) (Oral)  Resp 13  SpO2 97%  Meds ordered this encounter  Medications  . LORazepam (ATIVAN) tablet 1 mg    Sig:   . acetaminophen (TYLENOL) tablet 650 mg    Sig:   . ibuprofen (ADVIL,MOTRIN) tablet 600 mg    Sig:   . zolpidem (AMBIEN) tablet 5 mg    Sig:   . ondansetron (ZOFRAN) tablet 4 mg    Sig:   . alum & mag hydroxide-simeth (MAALOX/MYLANTA) 200-200-20 MG/5ML suspension  30 mL    Sig:   . amLODipine (NORVASC) tablet 10 mg    Sig:   . aspirin EC tablet 81 mg    Sig:   . atorvastatin (LIPITOR) tablet 40 mg    Sig:   . cloNIDine (CATAPRES) tablet 0.2 mg    Sig:   . metoprolol tartrate (LOPRESSOR) tablet 50 mg    Sig:      Shatyra Becka Camprubi-Soms, PA-C 08/28/14 1915  Vanetta Mulders, MD 09/01/14 1401

## 2014-08-28 NOTE — BH Assessment (Signed)
TTS Kseniya Grunden will assess the patient.  

## 2014-08-28 NOTE — ED Notes (Signed)
Pt had an appt at [facility] but he was 10 minutes late so they would not see him. He reports he came here to be checked out he would like to have his liver checked and his BP has been elevated. He denies pain, sob, n/v, weakness, fatigue, weight loss. He is A&Ox4, resp e/u

## 2014-08-28 NOTE — ED Notes (Signed)
Staffing called for a sitter. Telepsych in process.

## 2014-08-28 NOTE — ED Notes (Signed)
Pt now agrees to have his blood work drawn - phlebotomy notified.

## 2014-08-28 NOTE — BH Assessment (Signed)
Per Denice Bors, NP - patient meets criteria for inpatient hospitalization once the patient is medically cleared.

## 2014-08-28 NOTE — ED Notes (Signed)
Pt states that the clinic telling him that they could not see him today makes him feel suicidal. States he is tired of it.

## 2014-08-29 ENCOUNTER — Encounter: Payer: Self-pay | Admitting: Family Medicine

## 2014-08-29 ENCOUNTER — Ambulatory Visit (HOSPITAL_BASED_OUTPATIENT_CLINIC_OR_DEPARTMENT_OTHER): Payer: Medicaid Other | Admitting: Family Medicine

## 2014-08-29 ENCOUNTER — Ambulatory Visit: Payer: Medicaid Other | Attending: Internal Medicine | Admitting: Clinical

## 2014-08-29 VITALS — BP 149/99 | HR 58 | Temp 98.0°F | Ht 67.0 in | Wt 228.0 lb

## 2014-08-29 DIAGNOSIS — R51 Headache: Secondary | ICD-10-CM | POA: Diagnosis not present

## 2014-08-29 DIAGNOSIS — I129 Hypertensive chronic kidney disease with stage 1 through stage 4 chronic kidney disease, or unspecified chronic kidney disease: Secondary | ICD-10-CM | POA: Insufficient documentation

## 2014-08-29 DIAGNOSIS — Z86718 Personal history of other venous thrombosis and embolism: Secondary | ICD-10-CM | POA: Diagnosis not present

## 2014-08-29 DIAGNOSIS — Z72820 Sleep deprivation: Secondary | ICD-10-CM | POA: Diagnosis not present

## 2014-08-29 DIAGNOSIS — Z87891 Personal history of nicotine dependence: Secondary | ICD-10-CM | POA: Diagnosis not present

## 2014-08-29 DIAGNOSIS — I10 Essential (primary) hypertension: Secondary | ICD-10-CM | POA: Diagnosis not present

## 2014-08-29 DIAGNOSIS — I509 Heart failure, unspecified: Secondary | ICD-10-CM | POA: Diagnosis not present

## 2014-08-29 DIAGNOSIS — F329 Major depressive disorder, single episode, unspecified: Secondary | ICD-10-CM | POA: Diagnosis not present

## 2014-08-29 DIAGNOSIS — Z9861 Coronary angioplasty status: Secondary | ICD-10-CM | POA: Insufficient documentation

## 2014-08-29 DIAGNOSIS — N189 Chronic kidney disease, unspecified: Secondary | ICD-10-CM | POA: Insufficient documentation

## 2014-08-29 DIAGNOSIS — Z7982 Long term (current) use of aspirin: Secondary | ICD-10-CM | POA: Insufficient documentation

## 2014-08-29 DIAGNOSIS — R45851 Suicidal ideations: Secondary | ICD-10-CM | POA: Diagnosis not present

## 2014-08-29 DIAGNOSIS — E780 Pure hypercholesterolemia: Secondary | ICD-10-CM | POA: Diagnosis not present

## 2014-08-29 DIAGNOSIS — F419 Anxiety disorder, unspecified: Secondary | ICD-10-CM | POA: Insufficient documentation

## 2014-08-29 DIAGNOSIS — F331 Major depressive disorder, recurrent, moderate: Secondary | ICD-10-CM

## 2014-08-29 DIAGNOSIS — I252 Old myocardial infarction: Secondary | ICD-10-CM | POA: Insufficient documentation

## 2014-08-29 MED ORDER — ASPIRIN 81 MG PO TBEC: 81.0000 mg | DELAYED_RELEASE_TABLET | Freq: Every day | ORAL | Status: DC

## 2014-08-29 MED ORDER — AMLODIPINE BESYLATE 10 MG PO TABS: 10.0000 mg | ORAL_TABLET | Freq: Every day | ORAL | Status: DC

## 2014-08-29 MED ORDER — METOPROLOL TARTRATE 50 MG PO TABS: 50.0000 mg | ORAL_TABLET | Freq: Two times a day (BID) | ORAL | Status: DC

## 2014-08-29 MED ORDER — CLONIDINE HCL 0.2 MG PO TABS: 0.2000 mg | ORAL_TABLET | Freq: Two times a day (BID) | ORAL | Status: DC

## 2014-08-29 MED ORDER — ATORVASTATIN CALCIUM 40 MG PO TABS: 40.0000 mg | ORAL_TABLET | Freq: Every day | ORAL | Status: DC

## 2014-08-29 NOTE — Progress Notes (Signed)
Hospital follow up for hypertensive crisis Patient reports constant headache  Patient has been taking all medications Patient reports he was "kicked out of the psych hospital this am for depression" and that is why he did not take his medications this am

## 2014-08-29 NOTE — ED Notes (Addendum)
Patient on the call bell multiple times with staff trying to explain to the patient about our scheduled times.  Patient made aware that breakfast is served at 07:00, snack around 9ish, lunch at 12:00 and snack at 15:00.  Patient on the call bell repeatedly.  [employer] and charge RN made aware or patient calling out multiple times and charge RN at bedside with the patient.

## 2014-08-29 NOTE — ED Notes (Signed)
Patients belongings given to patient and inventory pulled from security.  Patient was verbally disrespectful to staff and demanding after being told multiple times regarding [facility] EDs policy on meals and snacks.  Multiple security at bedside with patient and sitter.    Patient verbalized understanding for leaving AMA and the threat to harm of self at discharge for not following up with further treatment.  Patient was escorted from the ED with [employer] to the waiting room.

## 2014-08-29 NOTE — ED Notes (Signed)
Pt. Received a nighttime snack of sprite and shortbread cookies

## 2014-08-29 NOTE — Patient Instructions (Signed)

## 2014-08-29 NOTE — Progress Notes (Signed)
Patient ID: Richard Barnett, [name]   DOB: Aug 21, 1955, 59 y.o.   MRN: [id number]    ASSESSMENT: Pt currently experiencing symptoms of depression. Pt needs to establish care with PCP, f/u with [facility]. Pt would benefit from further psychiatric assessment, as well as supportive counseling to cope with symptoms of depression.  Stage of Change: precontemplative  PLAN: 1. F/U with behavioral health consultant in as needed 2. Psychiatric Medications: none. 3. Behavioral recommendation(s):   -Attend [facility] appointment at 9am 08-30-14 -Keep mobile crisis number on hand, as needed -Consider doing volunteer work  SUBJECTIVE: Pt. referred by self for navigating healthcare system:  Pt. here for initial consultation regarding navigating healthcare system.  Pt. reports the following symptoms/concerns: Pt states that he just got out of the hospital and "nobody will help me". He says that he missed appointments and was late for appointments, because of transportation issues, and that he just wants to be seen by a doctor. Pt says that he has felt suicidal for at least two years now. Pt answered positively that he did make plans to end his life in the last 2 weeks, and then said that no, he doesn't really have a plan. He says he thinks about his kids and his grandkids, and just wants to be happy and have a happy life, but has no answer for what a happy life would look like for him. He says he has not been sleeping, stays up all night watching TV; also says he only eats once/day. He would like to work, but says he is unable to work because of disability status; states he might like to do volunteer work, but is unsure.He states that he wants to see a doctor; he says that maybe BH meds would help him.  Duration of problem: <2 years Severity: moderate  OBJECTIVE: Orientation & Cognition: Oriented x3. Thought processes normal and appropriate to situation. Mood: agitated. Affect: appropriate Appearance: appropriate Risk of  harm to self or others: low risk of harm to self, no risk of harm to others Substance use: none stated Psychiatric medication use: none Assessments administered: PSQ9-21  Diagnosis: Major depressive disorder, recurrent, unspecified CPT Code: F33.9 -------------------------------------------- Other(s) present in the room: none  Time spent with patient in exam room: 25 minutes

## 2014-08-29 NOTE — Progress Notes (Signed)
Subjective:    Patient ID: Richard Barnett, Richard Barnett    DOB: [birth year], 59 y.o.   MRN: [id number]  HPI  Richard Barnett is a 59 year old [place] with a history of Hypertension , hyperlipidemia, depression, CKD who presented to the ED after he had missed his outpatient appointment. He requested "check up" and during the course of the visit had mentioned he would rather "go home and die". Psych was consulted and the plan was to admit him due to suicidal ideations but the patient left AMA.  He was recently discharged after a hospitalization from 6/16-6/17 where he was managed for hypertensive encephalopathy and he was seen by Neurology and there was a suspicion for underlying Dementia.  He complains of headaches and states he has been awake all night and rarely gets to sleep.Denies nausea, vomiting, blurry vision or neck pain. He also endorses being depressed and has suicidal ideations and also expresses frustrations and not being able to be seen in the outpatient clinic. I have discussed the option of management of his depression and seeing a psychiatrist and he is willing to go if referred. He has all his medications and does not need refills  Past Medical History  Diagnosis Date  . Hypertension   . Hypercholesterolemia   . Heart murmur   . CHF (congestive heart failure)   . Anginal pain   . MI (myocardial infarction)     "I've had couple when I was asleep; a couple when I was awake" (08/22/2014)  . DVT (deep venous thrombosis) ?    RLE  . Daily headache   . Depression     "I always get that" (08/22/2014)  . Chronic kidney disease     "from my blood pressure problems" (08/22/2014)  . Chronic renal insufficiency     [name] 08/22/2014  . Arthritis     "right leg" (08/22/2014)    Past Surgical History  Procedure Laterality Date  . Coronary angioplasty with stent placement      "1 + 1"    Family History  Problem Relation Age of Onset  . Hypertension Mother   . Kidney disease Mother   .  Heart disease Mother   . Heart disease Father   . Hypertension Father     History   Social History  . Marital Status: Divorced    Spouse Name: N/A  . Number of Children: N/A  . Years of Education: N/A   Occupational History  . Not on file.   Social History Main Topics  . Smoking status: Former Smoker -- 0.00 packs/day for 40 years    Types: Cigarettes    Quit date: 08/29/2010  . Smokeless tobacco: Never Used     Comment: "quit smoking cigarettes in ~ 2011"  . Alcohol Use: No     Comment: 08/22/2014 "stopped in ~ 2011"  . Drug Use: No     Comment: 08/22/2014 "stopped in ~ 2011; all types of drugs""  . Sexual Activity: No   Other Topics Concern  . Not on file   Social History Narrative    Allergies  Allergen Reactions  . Penicillins Swelling and Rash    Current Outpatient Prescriptions on File Prior to Visit  Medication Sig Dispense Refill  . colchicine 0.6 MG tablet Take 0.6 mg by mouth as needed (for gout flareups).     No current facility-administered medications on file prior to visit.     Review of Systems  General: negative for fever, weight loss,  appetite change Respiratory: no wheezing, shortness of breath, cough Cardiovascular: no chest pain, no dyspnea on exertion, no pedal edema, no orthopnea. Gastrointestinal: no abdominal pain, no diarrhea, no constipation Genito-Urinary: no urinary frequency, no dysuria, no polyuria. Hematologic: no bruising Endocrine: no cold or heat intolerance Neurological: positive for headaches, no seizures, no tremors Musculoskeletal: no joint pains, no joint swelling Skin: no pruritus, no rash. Psychological: Positive for depression, positive for anxiety, positive for suicidal ideation, negative for suicidal intent        Objective: Filed Vitals:   08/29/14 1025 08/29/14 1028  BP:  149/99  Pulse:  58  Temp:  98 F (36.7 C)  Height:  (1.702 m)  (1.702 m)  Weight: 228 lb (103.42 kg) 228 lb (103.42 kg)    SpO2:  98%      Physical Exam  Constitutional: normal appearing,  Eyes: PERRLA HEENT: Head is atraumatic, normal sinuses, normal oropharynx, normal appearing tonsils and palate, tympanic membrane is normal bilaterally. Neck: normal range of motion, no thyromegaly, no JVD Cardiovascular: bradycardic rate and regular rhythm, normal heart sounds, no murmurs, rub or gallop, no pedal edema Respiratory: clear to auscultation bilaterally, no wheezes, no rales, no rhonchi Abdomen: soft, not tender to palpation, normal bowel sounds, no enlarged organs Extremities: Full ROM, no tenderness in joints Skin: warm and dry, no lesions. Neurological: alert, oriented x3 Psychological: depressed mood.         Assessment & Plan:  59 year old Richard Barnett Richard Barnett with a history of Hypertension, Hyperlipidemia, Depression, CKD here for a follow up visit.  Hypertension: Controlled continue medications.  Depression and anxiety: Behavioral Health called to see the patient as well. He was supposed to be admitted due to suicidal ideations but left AMA yesterday. PH Q9 score is 27 and GAD 7 score is 9. Patient to be linked with an outpatient psychiatry and mobile crisis number given as he is high risk  Headache: Likely due to sleep deprivation. Advised to use OTC Tylenol or NSAIDS for relief.  Will need labs, b12 and TSH at next visit for ?Dementia work up

## 2014-08-30 ENCOUNTER — Inpatient Hospital Stay: Payer: Medicaid Other | Admitting: Family Medicine

## 2014-09-12 ENCOUNTER — Other Ambulatory Visit: Payer: Medicaid Other

## 2014-12-13 ENCOUNTER — Encounter (HOSPITAL_COMMUNITY): Payer: Self-pay | Admitting: Emergency Medicine

## 2014-12-13 ENCOUNTER — Emergency Department (HOSPITAL_COMMUNITY)
Admission: EM | Admit: 2014-12-13 | Discharge: 2014-12-13 | Disposition: A | Discharge status: Home / Self Care | Payer: Medicaid Other | Attending: Emergency Medicine | Admitting: Emergency Medicine

## 2014-12-13 ENCOUNTER — Emergency Department (HOSPITAL_COMMUNITY): Payer: Medicaid Other

## 2014-12-13 DIAGNOSIS — M1711 Unilateral primary osteoarthritis, right knee: Secondary | ICD-10-CM

## 2014-12-13 DIAGNOSIS — Z79899 Other long term (current) drug therapy: Secondary | ICD-10-CM | POA: Diagnosis not present

## 2014-12-13 DIAGNOSIS — Z7982 Long term (current) use of aspirin: Secondary | ICD-10-CM | POA: Insufficient documentation

## 2014-12-13 DIAGNOSIS — Y9289 Other specified places as the place of occurrence of the external cause: Secondary | ICD-10-CM | POA: Insufficient documentation

## 2014-12-13 DIAGNOSIS — F329 Major depressive disorder, single episode, unspecified: Secondary | ICD-10-CM | POA: Diagnosis not present

## 2014-12-13 DIAGNOSIS — Y998 Other external cause status: Secondary | ICD-10-CM | POA: Insufficient documentation

## 2014-12-13 DIAGNOSIS — N189 Chronic kidney disease, unspecified: Secondary | ICD-10-CM | POA: Diagnosis not present

## 2014-12-13 DIAGNOSIS — E7800 Pure hypercholesterolemia, unspecified: Secondary | ICD-10-CM | POA: Diagnosis not present

## 2014-12-13 DIAGNOSIS — Z87891 Personal history of nicotine dependence: Secondary | ICD-10-CM | POA: Diagnosis not present

## 2014-12-13 DIAGNOSIS — I252 Old myocardial infarction: Secondary | ICD-10-CM | POA: Insufficient documentation

## 2014-12-13 DIAGNOSIS — S8991XA Unspecified injury of right lower leg, initial encounter: Secondary | ICD-10-CM | POA: Insufficient documentation

## 2014-12-13 DIAGNOSIS — I509 Heart failure, unspecified: Secondary | ICD-10-CM | POA: Insufficient documentation

## 2014-12-13 DIAGNOSIS — R011 Cardiac murmur, unspecified: Secondary | ICD-10-CM | POA: Diagnosis not present

## 2014-12-13 DIAGNOSIS — M25561 Pain in right knee: Secondary | ICD-10-CM

## 2014-12-13 DIAGNOSIS — W1839XA Other fall on same level, initial encounter: Secondary | ICD-10-CM | POA: Insufficient documentation

## 2014-12-13 DIAGNOSIS — Z880 Allergy status to penicillin: Secondary | ICD-10-CM | POA: Insufficient documentation

## 2014-12-13 DIAGNOSIS — I129 Hypertensive chronic kidney disease with stage 1 through stage 4 chronic kidney disease, or unspecified chronic kidney disease: Secondary | ICD-10-CM | POA: Insufficient documentation

## 2014-12-13 DIAGNOSIS — Y9389 Activity, other specified: Secondary | ICD-10-CM | POA: Diagnosis not present

## 2014-12-13 DIAGNOSIS — Z86718 Personal history of other venous thrombosis and embolism: Secondary | ICD-10-CM | POA: Insufficient documentation

## 2014-12-13 DIAGNOSIS — M25461 Effusion, right knee: Secondary | ICD-10-CM

## 2014-12-13 MED ORDER — TRAMADOL HCL 50 MG PO TABS: 50.0000 mg | ORAL_TABLET | Freq: Four times a day (QID) | ORAL | Status: DC | PRN

## 2014-12-13 MED ORDER — OXYCODONE-ACETAMINOPHEN 5-325 MG PO TABS
1.0000 | ORAL_TABLET | Freq: Once | ORAL | Status: AC
  Administered 2014-12-13: 1 via ORAL
  Filled 2014-12-13: qty 1

## 2014-12-13 MED ORDER — CLONIDINE HCL 0.1 MG PO TABS
0.2000 mg | ORAL_TABLET | Freq: Two times a day (BID) | ORAL | Status: DC
  Administered 2014-12-13: 0.2 mg via ORAL
  Filled 2014-12-13: qty 2

## 2014-12-13 MED ORDER — AMLODIPINE BESYLATE 10 MG PO TABS
10.0000 mg | ORAL_TABLET | Freq: Every day | ORAL | Status: DC
  Administered 2014-12-13: 10 mg via ORAL
  Filled 2014-12-13: qty 1

## 2014-12-13 NOTE — ED Notes (Signed)
Per EMS. Pt reports R knee pain for the past 3 weeks. Pt denies any triggering falls or injury. Describes the pain as being internal to his popliteal area. No tenderness to palpation, redness or deformity noted. R knee more swollen than L knee. Pt fell a few days ago, but pain has not changed. Pt has also not had BP meds today, told EMS his systolic pressure is usually in 180s.

## 2014-12-13 NOTE — Progress Notes (Signed)
Patient listed as having Medicaid [place] access insurance.  PCP listed on patient's insurance card is located at the [facility].

## 2014-12-13 NOTE — Discharge Instructions (Signed)
Baker Cyst °A Baker cyst is a sac-like structure that forms in the back of the knee. It is filled with the same fluid that is located in your knee. This fluid lubricates the bones and cartilage of the knee and allows them to move over each other more easily. °CAUSES  °When the knee becomes injured or inflamed, increased fluid forms in the knee. When this happens, the joint lining is pushed out behind the knee and forms the Baker cyst. This cyst may also be caused by inflammation from arthritic conditions and infections. °SIGNS AND SYMPTOMS  °A Baker cyst usually has no symptoms. When the cyst is substantially enlarged: °· You may feel pressure behind the knee, stiffness in the knee, or a mass in the area behind the knee. °· You may develop pain, redness, and swelling in the calf.  This can suggest a blood clot and requires evaluation by your health care provider. °DIAGNOSIS  °A Baker cyst is most often found during an ultrasound exam. This exam may have been performed for other reasons, and the cyst was found incidentally. Sometimes an MRI is used. This picks up other problems within a joint that an ultrasound exam may not. If the Baker cyst developed immediately after an injury, X-ray exams may be used to diagnose the cyst. °TREATMENT  °The treatment depends on the cause of the cyst. Anti-inflammatory medicines and rest often will be prescribed. If the cyst is caused by a bacterial infection, antibiotic medicines may be prescribed.  °HOME CARE INSTRUCTIONS  °· If the cyst was caused by an injury, for the first 24 hours, keep the injured leg elevated on 2 pillows while lying down. °· For the first 24 hours while you are awake, apply ice to the injured area: °· Put ice in a plastic bag. °· Place a towel between your skin and the bag. °· Leave the ice on for 20 minutes, 2-3 times a day. °· Only take over-the-counter or prescription medicines for pain, discomfort, or fever as directed by your health care  provider. °· Only take antibiotic medicine as directed. Make sure to finish it even if you start to feel better. °MAKE SURE YOU:  °· Understand these instructions. °· Will watch your condition. °· Will get help right away if you are not doing well or get worse. °  °This information is not intended to replace advice given to you by your health care provider. Make sure you discuss any questions you have with your health care provider. °  °Document Released: 02/22/2005 Document Revised: 12/13/2012 Document Reviewed: 10/04/2012 °Elsevier Interactive Patient Education ©2016 [employer] ° °Arthritis °Arthritis is a term that is commonly used to refer to joint pain or joint disease. There are more than 100 types of arthritis. °CAUSES °The most common cause of this condition is wear and tear of a joint. Other causes include: °· Gout. °· Inflammation of a joint. °· An infection of a joint. °· Sprains and other injuries near the joint. °· A drug reaction or allergic reaction. °In some cases, the cause may not be known. °SYMPTOMS °The main symptom of this condition is pain in the joint with movement. Other symptoms include: °· Redness, swelling, or stiffness at a joint. °· Warmth coming from the joint. °· Fever. °· Overall feeling of illness. °DIAGNOSIS °This condition may be diagnosed with a physical exam and tests, including: °· Blood tests. °· Urine tests. °· Imaging tests, such as MRI, X-rays, or a CT scan. °Sometimes, fluid is removed   from a joint for testing. °TREATMENT °Treatment for this condition may involve: °· Treatment of the cause, if it is known. °· Rest. °· Raising (elevating) the joint. °· Applying cold or hot packs to the joint. °· Medicines to improve symptoms and reduce inflammation. °· Injections of a steroid such as cortisone into the joint to help reduce pain and inflammation. °Depending on the cause of your arthritis, you may need to make lifestyle changes to reduce stress on your joint. These changes  may include exercising more and losing weight. °HOME CARE INSTRUCTIONS °Medicines °· Take over-the-counter and prescription medicines only as told by your health care provider. °· Do not take aspirin to relieve pain if gout is suspected. °Activities °· Rest your joint if told by your health care provider. Rest is important when your disease is active and your joint feels painful, swollen, or stiff. °· Avoid activities that make the pain worse. It is important to balance activity with rest. °· Exercise your joint regularly with range-of-motion exercises as told by your health care provider. Try doing low-impact exercise, such as: °¨ Swimming. °¨ Water aerobics. °¨ Biking. °¨ Walking. °Joint Care °· If your joint is swollen, keep it elevated if told by your health care provider. °· If your joint feels stiff in the morning, try taking a warm shower. °· If directed, apply heat to the joint. If you have diabetes, do not apply heat without permission from your health care provider. °¨ Put a towel between the joint and the hot pack or heating pad. °¨ Leave the heat on the area for 20-30 minutes. °· If directed, apply ice to the joint: °¨ Put ice in a plastic bag. °¨ Place a towel between your skin and the bag. °¨ Leave the ice on for 20 minutes, 2-3 times per day. °· Keep all follow-up visits as told by your health care provider. This is important. °SEEK MEDICAL CARE IF: °· The pain gets worse. °· You have a fever. °SEEK IMMEDIATE MEDICAL CARE IF: °· You develop severe joint pain, swelling, or redness. °· Many joints become painful and swollen. °· You develop severe back pain. °· You develop severe weakness in your leg. °· You cannot control your bladder or bowels. °  °This information is not intended to replace advice given to you by your health care provider. Make sure you discuss any questions you have with your health care provider. °  °Document Released: 04/01/2004 Document Revised: 11/13/2014 Document Reviewed:  05/20/2014 °Elsevier Interactive Patient Education ©2016 Elsevier Inc. ° °

## 2014-12-13 NOTE — ED Provider Notes (Signed)
CSN: [id number]     Arrival date & time 12/13/14  1636 History   First MD Initiated Contact with Patient 12/13/14 1837     Chief Complaint  Patient presents with  . Leg Pain     (Consider location/radiation/quality/duration/timing/severity/associated sxs/prior Treatment) Patient is a 59 y.o. Richard Barnett presenting with leg pain.  Leg Pain Location:  Knee Time since incident:  3 weeks Injury: yes   Mechanism of injury: fall   Mechanism of injury comment:  2 days ago, made it worse Fall:    Fall occurred: moving some stuff and fell from standing, concrete.   Impact surface:  Hard floor and concrete Knee location:  R knee Pain details:    Radiates to:  Does not radiate   Severity:  Severe   Timing:  Constant   Progression:  Waxing and waning Chronicity:  New Dislocation: no   Foreign body present:  No foreign bodies Prior injury to area:  No Worsened by:  Flexion and bearing weight Ineffective treatments:  None tried Associated symptoms: stiffness and swelling   Associated symptoms: no back pain, no fever and no numbness     Past Medical History  Diagnosis Date  . Hypertension   . Hypercholesterolemia   . Heart murmur   . CHF (congestive heart failure) (HCC)   . Anginal pain (HCC)   . MI (myocardial infarction) (HCC)     "I've had couple when I was asleep; a couple when I was awake" (08/22/2014)  . DVT (deep venous thrombosis) (HCC) ?    RLE  . Daily headache   . Depression     "I always get that" (08/22/2014)  . Chronic kidney disease     "from my blood pressure problems" (08/22/2014)  . Chronic renal insufficiency     [name] 08/22/2014  . Arthritis     "right leg" (08/22/2014)   Past Surgical History  Procedure Laterality Date  . Coronary angioplasty with stent placement      "1 + 1"   Family History  Problem Relation Age of Onset  . Hypertension Mother   . Kidney disease Mother   . Heart disease Mother   . Heart disease Father   . Hypertension Father     Social History  Substance Use Topics  . Smoking status: Former Smoker -- 0.00 packs/day for 40 years    Types: Cigarettes    Quit date: 08/29/2010  . Smokeless tobacco: Never Used     Comment: "quit smoking cigarettes in ~ 2011"  . Alcohol Use: No     Comment: 08/22/2014 "stopped in ~ 2011"    Review of Systems  Constitutional: Negative for fever.  HENT: Negative for sore throat.   Eyes: Negative for visual disturbance.  Respiratory: Negative for shortness of breath.   Cardiovascular: Negative for chest pain.  Gastrointestinal: Negative for abdominal pain.  Genitourinary: Negative for difficulty urinating.  Musculoskeletal: Positive for stiffness. Negative for back pain and neck stiffness.  Skin: Negative for rash.  Neurological: Negative for syncope and headaches.      Allergies  Penicillins  Home Medications   Prior to Admission medications   Medication Sig Start Date End Date Taking? Authorizing Provider  amLODipine (NORVASC) 10 MG tablet Take 1 tablet (10 mg total) by mouth daily. 08/29/14  Yes Jaclyn Shaggy, MD  aspirin 81 MG EC tablet Take 1 tablet (81 mg total) by mouth daily. 08/29/14  Yes Jaclyn Shaggy, MD  atorvastatin (LIPITOR) 40 MG tablet Take 1 tablet (40  mg total) by mouth daily at 6 PM. 08/29/14  Yes Jaclyn Shaggy, MD  cloNIDine (CATAPRES) 0.2 MG tablet Take 1 tablet (0.2 mg total) by mouth 2 (two) times daily. 08/29/14  Yes Jaclyn Shaggy, MD  metoprolol (LOPRESSOR) 50 MG tablet Take 1 tablet (50 mg total) by mouth 2 (two) times daily. 08/29/14  Yes Jaclyn Shaggy, MD  traMADol (ULTRAM) 50 MG tablet Take 1 tablet (50 mg total) by mouth every 6 (six) hours as needed. 12/13/14   Alvira Monday, MD   BP 155/90 mmHg  Pulse 65  Temp(Src) 97.7 F (36.5 C) (Oral)  Resp 18  SpO2 96% Physical Exam  Constitutional: He is oriented to person, place, and time. He appears well-developed and well-nourished. No distress.  HENT:  Head: Normocephalic and atraumatic.  Eyes:  Conjunctivae and EOM are normal.  Neck: Normal range of motion.  Cardiovascular: Normal rate, regular rhythm, normal heart sounds and intact distal pulses.  Exam reveals no gallop and no friction rub.   No murmur heard. Pulmonary/Chest: Effort normal and breath sounds normal. No respiratory distress. He has no wheezes. He has no rales.  Abdominal: Soft. He exhibits no distension. There is no tenderness. There is no guarding.  Musculoskeletal: He exhibits no edema.       Right knee: He exhibits swelling (mild, mild swelling behind knee) and effusion. He exhibits normal range of motion. Tenderness (popliteal) found. Medial joint line and lateral joint line tenderness noted.       Left knee: He exhibits normal range of motion. No tenderness found.  Neurological: He is alert and oriented to person, place, and time.  Skin: Skin is warm and dry. He is not diaphoretic.  Nursing note and vitals reviewed.   ED Course  Procedures (including critical care time) Labs Review Labs Reviewed - No data to display  Imaging Review Dg Knee Complete 4 Views Right  12/13/2014   CLINICAL DATA:  Right knee pain for 3 weeks without reported injury.  EXAM: RIGHT KNEE - COMPLETE 4+ VIEW  COMPARISON:  None.  FINDINGS: No fracture or dislocation is noted. Minimal suprapatellar joint effusion is noted. Moderate narrowing of medial joint space is noted with osteophyte formation. Osteophyte formation is noted laterally is well. No soft tissue abnormality is noted.  IMPRESSION: Moderate degenerative joint disease is noted. Minimal suprapatellar joint effusion is noted. No other significant abnormality seen in the right knee.   Electronically Signed   By: Lupita Raider, M.D.   On: 12/13/2014 20:19   I have personally reviewed and evaluated these images and lab results as part of my medical decision-making.   EKG Interpretation None      MDM   Final diagnoses:  Right knee pain  Joint effusion, knee, right   Arthritis of right knee   Richard Barnett with history of htn, CKD, hypercholesterolemia, CAD, presents with concern of recurrent right knee pain with this episode lasting 3wk.  Patient without erythema, no fever, full range of motion of joint and have low suspicion for septic arthritis.  No history of trauma to suggest fracture or acute ligamentous injury.  XR shows small suprapatellar effusion and degenerative changes. Doubt DVT given pain concentrated about the knee, no other lower ext swelling.  DDx for pain in right knee includes exacerbation of pain related to arthritis, gout or other meniscal abnormality. Given crutches to use as needed for comfort.  Wrote rx for tramadol and recommended calling [facility] to obtain PCP.  Pt also hypertensive, reports he has not taken his BP medications today but does have some at home.  No symptoms to suggest hypertensive emergency.  Given home blood pressure medications and discussed importance of BP control and avoidance of NSAIDs given CKD.  Patient discharged in stable condition with understanding of reasons to return.      Alvira Monday, MD 12/14/14 [phone]

## 2015-02-04 ENCOUNTER — Ambulatory Visit: Payer: Medicaid Other | Admitting: Family Medicine

## 2015-02-11 ENCOUNTER — Other Ambulatory Visit: Payer: Self-pay | Admitting: Family Medicine

## 2015-02-11 ENCOUNTER — Telehealth: Payer: Self-pay | Admitting: Clinical

## 2015-02-11 ENCOUNTER — Ambulatory Visit (INDEPENDENT_AMBULATORY_CARE_PROVIDER_SITE_OTHER): Payer: Medicaid Other | Admitting: Family Medicine

## 2015-02-11 ENCOUNTER — Encounter (HOSPITAL_BASED_OUTPATIENT_CLINIC_OR_DEPARTMENT_OTHER): Payer: Medicaid Other | Admitting: Clinical

## 2015-02-11 VITALS — BP 182/98 | HR 73 | Temp 98.3°F | Resp 16 | Ht 67.0 in | Wt 238.0 lb

## 2015-02-11 DIAGNOSIS — F411 Generalized anxiety disorder: Secondary | ICD-10-CM

## 2015-02-11 DIAGNOSIS — I10 Essential (primary) hypertension: Secondary | ICD-10-CM | POA: Diagnosis not present

## 2015-02-11 DIAGNOSIS — M25561 Pain in right knee: Secondary | ICD-10-CM | POA: Diagnosis not present

## 2015-02-11 DIAGNOSIS — E785 Hyperlipidemia, unspecified: Secondary | ICD-10-CM | POA: Diagnosis not present

## 2015-02-11 DIAGNOSIS — Z658 Other specified problems related to psychosocial circumstances: Secondary | ICD-10-CM

## 2015-02-11 LAB — POCT URINALYSIS DIP (DEVICE)
BILIRUBIN URINE: NEGATIVE
Glucose, UA: NEGATIVE mg/dL
Ketones, ur: NEGATIVE mg/dL
LEUKOCYTES UA: NEGATIVE
NITRITE: NEGATIVE
Protein, ur: >=300 mg/dL — AB
Specific Gravity, Urine: >=1.03 (ref 1.005–1.030)
Urobilinogen, UA: 0.2 mg/dL (ref 0.0–1.0)
pH: 5.5 (ref 5.0–8.0)

## 2015-02-11 LAB — COMPLETE METABOLIC PANEL WITH GFR
ALBUMIN: 3.6 g/dL (ref 3.6–5.1)
ALK PHOS: 58 U/L (ref 40–115)
ALT: 15 U/L (ref 9–46)
AST: 19 U/L (ref 10–35)
BUN: 26 mg/dL — ABNORMAL HIGH (ref 7–25)
CALCIUM: 8.6 mg/dL (ref 8.6–10.3)
CO2: 27 mmol/L (ref 20–31)
Chloride: 105 mmol/L (ref 98–110)
Creat: 2.37 mg/dL — ABNORMAL HIGH (ref 0.70–1.25)
GFR, EST AFRICAN AMERICAN: 33 mL/min — AB (ref >=60)
GFR, EST NON AFRICAN AMERICAN: 29 mL/min — AB (ref >=60)
Glucose, Bld: 77 mg/dL (ref 65–99)
POTASSIUM: 4.4 mmol/L (ref 3.5–5.3)
SODIUM: 143 mmol/L (ref 135–146)
Total Bilirubin: 0.4 mg/dL (ref 0.2–1.2)
Total Protein: 6.6 g/dL (ref 6.1–8.1)

## 2015-02-11 LAB — CBC WITH DIFFERENTIAL/PLATELET
BASOS PCT: 1 % (ref 0–1)
Basophils Absolute: 0 10*3/uL (ref 0.0–0.1)
EOS PCT: 6 % — AB (ref 0–5)
Eosinophils Absolute: 0.3 10*3/uL (ref 0.0–0.7)
HCT: 40.4 % (ref 39.0–52.0)
Hemoglobin: 13.2 g/dL (ref 13.0–17.0)
Lymphocytes Relative: 47 % — ABNORMAL HIGH (ref 12–46)
Lymphs Abs: 2.3 10*3/uL (ref 0.7–4.0)
MCH: 28.4 pg (ref 26.0–34.0)
MCHC: 32.7 g/dL (ref 30.0–36.0)
MCV: 86.9 fL (ref 78.0–100.0)
MONO ABS: 0.6 10*3/uL (ref 0.1–1.0)
MONOS PCT: 12 % (ref 3–12)
MPV: 10.5 fL (ref 8.6–12.4)
Neutro Abs: 1.6 10*3/uL — ABNORMAL LOW (ref 1.7–7.7)
Neutrophils Relative %: 34 % — ABNORMAL LOW (ref 43–77)
Platelets: 263 10*3/uL (ref 150–400)
RBC: 4.65 MIL/uL (ref 4.22–5.81)
RDW: 14.4 % (ref 11.5–15.5)
WBC: 4.8 10*3/uL (ref 4.0–10.5)

## 2015-02-11 MED ORDER — METOPROLOL TARTRATE 50 MG PO TABS: 50.0000 mg | ORAL_TABLET | Freq: Two times a day (BID) | ORAL | Status: DC

## 2015-02-11 MED ORDER — ASPIRIN 81 MG PO TBEC: 81.0000 mg | DELAYED_RELEASE_TABLET | Freq: Every day | ORAL | Status: DC

## 2015-02-11 MED ORDER — ACETAMINOPHEN-CODEINE #3 300-30 MG PO TABS: 1.0000 | ORAL_TABLET | Freq: Four times a day (QID) | ORAL | Status: DC | PRN

## 2015-02-11 MED ORDER — AMLODIPINE BESYLATE 10 MG PO TABS: 10.0000 mg | ORAL_TABLET | Freq: Every day | ORAL | Status: DC

## 2015-02-11 MED ORDER — CLONIDINE HCL 0.1 MG PO TABS
0.2000 mg | ORAL_TABLET | Freq: Once | ORAL | Status: AC
  Administered 2015-02-11: 0.2 mg via ORAL

## 2015-02-11 MED ORDER — ATORVASTATIN CALCIUM 40 MG PO TABS: 40.0000 mg | ORAL_TABLET | Freq: Every day | ORAL | Status: DC

## 2015-02-11 MED ORDER — CLONIDINE HCL 0.2 MG PO TABS: 0.2000 mg | ORAL_TABLET | Freq: Two times a day (BID) | ORAL | Status: DC

## 2015-02-11 NOTE — Progress Notes (Signed)
ASSESSMENT: Pt currently experiencing psychosocial stressors. Pt needs to establish PCP care, f/u with [facility]; would benefit from supportive counseling regarding coping with psychosocial stressors. Stage of Change: precontemplative  PLAN: 1. F/U with behavioral health consultant in as needed 2. Psychiatric Medications: none. 3. Behavioral recommendation(s):   -Go to [facility] for medication refills -Go to [employer] for dinner -Continue attending weekly church services for community support -Continue spending time with family, as able SUBJECTIVE: Pt. referred by Julianne Handler, FNP for possible suicidal ideation Pt. reports the following symptoms/concerns: Pt states that he has not eaten today, that he has not taken his medications in at least two months, and that his knee hurts. He is in stable housing, and is able to ride the bus with no problems, but is unable to pay full amount for medications. He has 9 children (5 grown, does not recall ages), as well as 6,8,8, and 10 year olds. He currently has Medicaid, but says he has not been declared disabled, has no income, his one grown daughter helps him with housing financially. Pt currently has disability case pending, and was told by previous nurse that he needs home health care.    Duration of problem: At least 2 months Severity: moderate to severe  OBJECTIVE: Orientation & Cognition: Oriented x3. Thought processes normal and appropriate to situation. Mood: appropriate. Affect: appropriate Appearance: appropriate Risk of harm to self or others: no risk of harm to self today, no risk of harm to others Substance use: none Assessments administered: Risk assessment- no current suicidal ideation, no plan, threats in past but no attempts, looks forward to see children and grandchildren again, feels better after eating sandwich  Diagnosis: Psychosocial stressors CPT Code:  Z65.9 -------------------------------------------- Other(s) present in the room: none  Time spent with patient in exam room: 40 minutes

## 2015-02-11 NOTE — Progress Notes (Signed)
Subjective:    Patient ID: Richard Barnett, [place]    DOB: 1955-03-28, 59 y.o.   MRN: [id number]  HPI Richard Barnett, a 59 year old [place] with a history of hypertension presents to establish care. He state that he was previously followed by [facility]. He states that he has been unable to get a follow up appointment. Patient has a history of uncontrolled hypertension. He states that he "ran out" of one of his hypertension medications so he stopped taking them all. He does not exercise or follow a low sodium diet. Patient has a history of a DVT and an MI, he is currently not on anticoagulation therapy. Patient does not check blood pressure at home.  Patient denies dizziness, headache,  chest pain, dyspnea, fatigue, irregular heart beat, orthopnea, palpitations, syncope and tachypnea.  Cardiovascular risk factors include: hypertension, Josua gender, obesity (BMI >= 30 kg/m2) and sedentary lifestyle.   Patient has a history of depression. He is currntly not taking medicaitons for depression. He states that he is not under the care of psychiatry. Patient reports some anhedonia. He currently denies suicidal or homicidal intent. He states that he has thought about suicide in the past. He denies visual or auditory hallucinations. He also reports a history of anxiety. His anxiety is triggered by stressful situations. He has been on medications in the past.   Past Medical History  Diagnosis Date  . Hypertension   . Hypercholesterolemia   . Heart murmur   . CHF (congestive heart failure) (HCC)   . Anginal pain (HCC)   . MI (myocardial infarction) (HCC)     "I've had couple when I was asleep; a couple when I was awake" (08/22/2014)  . DVT (deep venous thrombosis) (HCC) ?    RLE  . Daily headache   . Depression     "I always get that" (08/22/2014)  . Chronic kidney disease     "from my blood pressure problems" (08/22/2014)  . Chronic renal insufficiency     [name] 08/22/2014  . Arthritis    "right leg" (08/22/2014)   There is no immunization history on file for this patient.  Social History   Social History  . Marital Status: Divorced    Spouse Name: N/A  . Number of Children: N/A  . Years of Education: N/A   Occupational History  . Not on file.   Social History Main Topics  . Smoking status: Former Smoker -- 0.00 packs/day for 40 years    Types: Cigarettes    Quit date: 08/29/2010  . Smokeless tobacco: Never Used     Comment: "quit smoking cigarettes in ~ 2011"  . Alcohol Use: No     Comment: 08/22/2014 "stopped in ~ 2011"  . Drug Use: No     Comment: 08/22/2014 "stopped in ~ 2011; all types of drugs""  . Sexual Activity: No   Other Topics Concern  . Not on file   Social History Narrative   Allergies  Allergen Reactions  . Penicillins Swelling and Rash    Swelling of whole body  Has patient had a PCN reaction causing immediate rash, facial/tongue/throat swelling, SOB or lightheadedness with hypotension: No Has patient had a PCN reaction causing severe rash involving mucus membranes or skin necrosis: No Has patient had a PCN reaction that required hospitalization No Has patient had a PCN reaction occurring within the last 10 years: No If all of the above answers are "NO", then may proceed with Cephalosporin use.  Review of Systems  Constitutional: Negative for fever and fatigue.  HENT: Negative.   Eyes: Negative.  Negative for visual disturbance.  Respiratory: Negative.  Negative for chest tightness.   Cardiovascular: Negative.   Gastrointestinal: Negative.   Endocrine: Negative for polydipsia, polyphagia and polyuria.  Genitourinary: Negative for dysuria.  Musculoskeletal: Negative.   Skin: Negative.   Allergic/Immunologic: Negative for immunocompromised state.  Neurological: Negative.  Negative for dizziness, tremors and syncope.  Hematological: Negative.   Psychiatric/Behavioral: Positive for agitation. Negative for suicidal ideas, behavioral  problems, confusion, sleep disturbance and decreased concentration. The patient is nervous/anxious.        Objective:   Physical Exam  Constitutional: He is oriented to person, place, and time. He appears well-developed and well-nourished.  HENT:  Head: Normocephalic and atraumatic.  Right Ear: External ear normal.  Left Ear: External ear normal.  Nose: Nose normal.  Mouth/Throat: Oropharynx is clear and moist.  Eyes: Conjunctivae and EOM are normal. Pupils are equal, round, and reactive to light.  Neck: Normal range of motion. Neck supple.  Cardiovascular: Normal rate, regular rhythm, normal heart sounds and intact distal pulses.   Pulmonary/Chest: Effort normal and breath sounds normal.  Abdominal: Soft. Bowel sounds are normal.  Musculoskeletal: Normal range of motion.  Neurological: He is alert and oriented to person, place, and time. He has normal reflexes.  Skin: Skin is warm and dry.  Psychiatric: He has a normal mood and affect. His behavior is normal. Judgment and thought content normal.     BP 182/98 mmHg  Pulse 73  Temp(Src) 98.3 F (36.8 C) (Oral)  Resp 16  Ht 5\' 7"  (1.702 m)  Wt 238 lb (107.956 kg)  BMI 37.27 kg/m2 Assessment & Plan:  1. Hyperlipidemia Patient is to continue aspirin and statin therapy. Recommend a lowfat, lo sodium diet.  - atorvastatin (LIPITOR) 40 MG tablet; Take 1 tablet (40 mg total) by mouth daily at 6 PM.  Dispense: 30 tablet; Refill: 1 - aspirin 81 MG EC tablet; Take 1 tablet (81 mg total) by mouth daily.  Dispense: 30 tablet; Refill: 1 - Hemoglobin A1c  2. Essential hypertension Blood pressure is markedly elevated in office. Patient is asymptomatic; he was given Clonidine 0.2 mg in office, bp decreased slightly. Will re-start all anti-hypertensive mediation. The patient is asked to make an attempt to improve diet and exercise patterns to aid in medical management of this problem.  - metoprolol (LOPRESSOR) 50 MG tablet; Take 1 tablet (50  mg total) by mouth 2 (two) times daily.  Dispense: 60 tablet; Refill: 1 - cloNIDine (CATAPRES) 0.2 MG tablet; Take 1 tablet (0.2 mg total) by mouth 2 (two) times daily.  Dispense: 60 tablet; Refill: 1 - atorvastatin (LIPITOR) 40 MG tablet; Take 1 tablet (40 mg total) by mouth daily at 6 PM.  Dispense: 30 tablet; Refill: 1 - aspirin 81 MG EC tablet; Take 1 tablet (81 mg total) by mouth daily.  Dispense: 30 tablet; Refill: 1 - amLODipine (NORVASC) 10 MG tablet; Take 1 tablet (10 mg total) by mouth daily.  Dispense: 30 tablet; Refill: 1 - POCT urinalysis dipstick - COMPLETE METABOLIC PANEL WITH GFR - TSH - CBC with Differential - cloNIDine (CATAPRES) tablet 0.2 mg; Take 2 tablets (0.2 mg total) by mouth once.  3. Right knee pain - acetaminophen-codeine (TYLENOL #3) 300-30 MG tablet; Take 1 tablet by mouth every 6 (six) hours as needed for moderate pain.  Dispense: 30 tablet; Refill: 0  4. Generalized anxiety disorder Patient  does not want to start medications for anxiety. GAD 7 is greater than 10. Recommend that patient follow up at [facility] clinic M-F 8am-3pm.   RTC: 1 month for hypertension The patient was given clear instructions to go to ER or return to medical center if symptoms do not improve, worsen or new problems develop. The patient verbalized understanding. Will notify patient with laboratory results. Massie Maroon, FNP

## 2015-02-11 NOTE — Telephone Encounter (Signed)
Richard Barnett would like to pick up his medications tomorrow, on 02-12-15; pharmacy has been notified that he needs refills on his medications.

## 2015-02-11 NOTE — Patient Instructions (Addendum)
Follow up with [facility]  [address] [place], [place] 7-8 Walk in clinic   Prescriptions will be available at [facility].    Will re-start anti-hypertensive medicationsDASH Eating Plan DASH stands for "Dietary Approaches to Stop Hypertension." The DASH eating plan is a healthy eating plan that has been shown to reduce high blood pressure (hypertension). Additional health benefits may include reducing the risk of type 2 diabetes mellitus, heart disease, and stroke. The DASH eating plan may also help with weight loss. WHAT DO I NEED TO KNOW ABOUT THE DASH EATING PLAN? For the DASH eating plan, you will follow these general guidelines:  Choose foods with a percent daily value for sodium of less than 5% (as listed on the food label).  Use salt-free seasonings or herbs instead of table salt or sea salt.  Check with your health care provider or pharmacist before using salt substitutes.  Eat lower-sodium products, often labeled as "lower sodium" or "no salt added."  Eat fresh foods.  Eat more vegetables, fruits, and low-fat dairy products.  Choose whole grains. Look for the word "whole" as the first word in the ingredient list.  Choose fish and skinless chicken or [place] more often than red meat. Limit fish, poultry, and meat to 6 oz (170 g) each day.  Limit sweets, desserts, sugars, and sugary drinks.  Choose heart-healthy fats.  Limit cheese to 1 oz (28 g) per day.  Eat more home-cooked food and less restaurant, buffet, and fast food.  Limit fried foods.  Cook foods using methods other than frying.  Limit canned vegetables. If you do use them, rinse them well to decrease the sodium.  When eating at a restaurant, ask that your food be prepared with less salt, or no salt if possible. WHAT FOODS CAN I EAT? Seek help from a dietitian for individual calorie needs. Grains Whole grain or whole wheat bread. Brown rice. Whole grain or  whole wheat pasta. Quinoa, bulgur, and whole grain cereals. Low-sodium cereals. Corn or whole wheat flour tortillas. Whole grain cornbread. Whole grain crackers. Low-sodium crackers. Vegetables Fresh or frozen vegetables (raw, steamed, roasted, or grilled). Low-sodium or reduced-sodium tomato and vegetable juices. Low-sodium or reduced-sodium tomato sauce and paste. Low-sodium or reduced-sodium canned vegetables.  Fruits All fresh, canned (in natural juice), or frozen fruits. Meat and Other Protein Products Ground beef (85% or leaner), grass-fed beef, or beef trimmed of fat. Skinless chicken or [place]. Ground chicken or [place]. Pork trimmed of fat. All fish and seafood. Eggs. Dried beans, peas, or lentils. Unsalted nuts and seeds. Unsalted canned beans. Dairy Low-fat dairy products, such as skim or 1% milk, 2% or reduced-fat cheeses, low-fat ricotta or cottage cheese, or plain low-fat yogurt. Low-sodium or reduced-sodium cheeses. Fats and Oils Tub margarines without trans fats. Light or reduced-fat mayonnaise and salad dressings (reduced sodium). Avocado. Safflower, olive, or canola oils. Natural peanut or almond butter. Other Unsalted popcorn and pretzels. The items listed above may not be a complete list of recommended foods or beverages. Contact your dietitian for more options. WHAT FOODS ARE NOT RECOMMENDED? Grains White bread. White pasta. White rice. Refined cornbread. Bagels and croissants. Crackers that contain trans fat. Vegetables Creamed or fried vegetables. Vegetables in a cheese sauce. Regular canned vegetables. Regular canned tomato sauce and paste. Regular tomato and vegetable juices. Fruits Dried fruits. Canned fruit in light or heavy syrup. Fruit juice. Meat and Other Protein Products Fatty cuts of meat. Ribs, chicken wings, bacon, sausage,  bologna, salami, chitterlings, fatback, hot dogs, bratwurst, and packaged luncheon meats. Salted nuts and seeds. Canned beans with  salt. Dairy Whole or 2% milk, cream, half-and-half, and cream cheese. Whole-fat or sweetened yogurt. Full-fat cheeses or blue cheese. Nondairy creamers and whipped toppings. Processed cheese, cheese spreads, or cheese curds. Condiments Onion and garlic salt, seasoned salt, table salt, and sea salt. Canned and packaged gravies. Worcestershire sauce. Tartar sauce. Barbecue sauce. Teriyaki sauce. Soy sauce, including reduced sodium. Steak sauce. Fish sauce. Oyster sauce. Cocktail sauce. Horseradish. Ketchup and mustard. Meat flavorings and tenderizers. Bouillon cubes. Hot sauce. Tabasco sauce. Marinades. Taco seasonings. Relishes. Fats and Oils Butter, stick margarine, lard, shortening, ghee, and bacon fat. Coconut, palm kernel, or palm oils. Regular salad dressings. Other Pickles and olives. Salted popcorn and pretzels. The items listed above may not be a complete list of foods and beverages to avoid. Contact your dietitian for more information. WHERE CAN I FIND MORE INFORMATION? National Heart, Lung, and Blood Institute: [employer]   This information is not intended to replace advice given to you by your health care provider. Make sure you discuss any questions you have with your health care provider.   Document Released: 02/11/2011 Document Revised: 03/15/2014 Document Reviewed: 12/27/2012 Elsevier Interactive Patient Education 2016 Elsevier Inc. Generalized Anxiety Disorder Generalized anxiety disorder (GAD) is a mental disorder. It interferes with life functions, including relationships, work, and school. GAD is different from normal anxiety, which everyone experiences at some point in their lives in response to specific life events and activities. Normal anxiety actually helps [place] prepare for and get through these life events and activities. Normal anxiety goes away after the event or activity is over.  GAD causes anxiety that is not necessarily related  to specific events or activities. It also causes excess anxiety in proportion to specific events or activities. The anxiety associated with GAD is also difficult to control. GAD can vary from mild to severe. People with severe GAD can have intense waves of anxiety with physical symptoms (panic attacks).  SYMPTOMS The anxiety and worry associated with GAD are difficult to control. This anxiety and worry are related to many life events and activities and also occur more days than not for 6 months or longer. People with GAD also have three or more of the following symptoms (one or more in children):  Restlessness.   Fatigue.  Difficulty concentrating.   Irritability.  Muscle tension.  Difficulty sleeping or unsatisfying sleep. DIAGNOSIS GAD is diagnosed through an assessment by your health care provider. Your health care provider will ask you questions aboutyour mood,physical symptoms, and events in your life. Your health care provider may ask you about your medical history and use of alcohol or drugs, including prescription medicines. Your health care provider may also do a physical exam and blood tests. Certain medical conditions and the use of certain substances can cause symptoms similar to those associated with GAD. Your health care provider may refer you to a mental health specialist for further evaluation. TREATMENT The following therapies are usually used to treat GAD:   Medication. Antidepressant medication usually is prescribed for long-term daily control. Antianxiety medicines may be added in severe cases, especially when panic attacks occur.   Talk therapy (psychotherapy). Certain types of talk therapy can be helpful in treating GAD by providing support, education, and guidance. A form of talk therapy called cognitive behavioral therapy can teach you healthy ways to think about and react to daily life events and activities.  Stress  managementtechniques. These include yoga,  meditation, and exercise and can be very helpful when they are practiced regularly. A mental health specialist can help determine which treatment is best for you. Some people see improvement with one therapy. However, other people require a combination of therapies.   This information is not intended to replace advice given to you by your health care provider. Make sure you discuss any questions you have with your health care provider.   Document Released: 06/19/2012 Document Revised: 03/15/2014 Document Reviewed: 06/19/2012 Elsevier Interactive Patient Education [employer]

## 2015-02-12 ENCOUNTER — Encounter (HOSPITAL_BASED_OUTPATIENT_CLINIC_OR_DEPARTMENT_OTHER): Payer: Medicaid Other | Admitting: Clinical

## 2015-02-12 ENCOUNTER — Telehealth: Payer: Self-pay | Admitting: Clinical

## 2015-02-12 DIAGNOSIS — Z658 Other specified problems related to psychosocial circumstances: Secondary | ICD-10-CM

## 2015-02-12 LAB — TSH: TSH: 0.296 u[IU]/mL — AB (ref 0.350–4.500)

## 2015-02-12 LAB — HEMOGLOBIN A1C
HEMOGLOBIN A1C: 6.3 % — AB (ref <5.7)
MEAN PLASMA GLUCOSE: 134 mg/dL — AB (ref <117)

## 2015-02-12 NOTE — Telephone Encounter (Signed)
[place] currently on [place] [employer] bus, not sure which stop is closest to [facility]. It is recommended that Richard Barnett get off at the stop closest to [facility], call back [place] at [phone], and she will help him navigate towards [facility] building, and he agrees.

## 2015-02-12 NOTE — Progress Notes (Signed)
ASSESSMENT: Pt currently experiencing psychosocial stressors. Pt needs to continue f/u with PCP and [facility]; would benefit from supportive counseling regarding coping with psychosocial stressors, as well as healthcare navigation.  Stage of Change: precontemplative  PLAN: 1. F/U with behavioral health consultant in as needed 2. Psychiatric Medications: none. 3. Behavioral recommendation(s):   -Go to [employer] at [employer] -Take medication tonight, and continue taking as prescribed -Agree to [facility] referral, answer when they call SUBJECTIVE: Pt. referred by self for healthcare navigation:  Pt. reports the following symptoms/concerns: Pt needs help finding [facility] off bus stop, needs help managing his medications, needs help finding hot meal tonight.  Duration of problem: One day Severity: moderate  OBJECTIVE: Orientation & Cognition: Oriented x3. Thought processes normal and appropriate to situation. Mood: appropriate. Affect: appropriate Appearance: appropriate Risk of harm to self or others: no known risk of harm to self or others today Substance use: none Assessments administered: none  Diagnosis: Psychosocial stressors CPT Code: Z65.8 -------------------------------------------- Other(s) present in the room: none  Time spent with patient in exam room: 30 minutes

## 2015-02-13 LAB — T3, FREE: T3 FREE: 3 pg/mL (ref 2.3–4.2)

## 2015-02-13 LAB — T4: T4 TOTAL: 5.9 ug/dL (ref 4.5–12.0)

## 2015-02-14 ENCOUNTER — Other Ambulatory Visit: Payer: Self-pay | Admitting: Family Medicine

## 2015-02-14 ENCOUNTER — Encounter: Payer: Self-pay | Admitting: Family Medicine

## 2015-02-14 ENCOUNTER — Telehealth: Payer: Self-pay | Admitting: Clinical

## 2015-02-14 DIAGNOSIS — D72820 Lymphocytosis (symptomatic): Secondary | ICD-10-CM | POA: Insufficient documentation

## 2015-02-14 LAB — HEPATITIS PANEL, ACUTE
HCV Ab: NEGATIVE
Hep A IgM: NONREACTIVE
Hep B C IgM: NONREACTIVE
Hepatitis B Surface Ag: NEGATIVE

## 2015-02-14 NOTE — Telephone Encounter (Signed)
Mr. Mcfarling wants to know when "the social worker or doctor are coming to my house, cause I don't know what to eat, and they were supposed to be here yesterday"; he is reminded that he was referred to [facility]), that they are not doctors, they are social workers, and they can come in and help him with education on the foods he can eat to stay healthy, they will call and set up a time to meet with him, they will not simply show up at his door. Mr. Todorovich says he did go to [employer] on Wednesday night, as recommended. He says he is not supposed to eat salt, fried foods,2% milk, or pastries, and he cannot recall what else he was told not to eat, nor can he recall who told him about his new diet. When asked if he would like to be referred to the Me[employer] on Wheels program, he says yes, so a referral will be sent, recommending him for the program. He says he will eat baked chicken and some canned vegetables for lunch today. Mr. Ostrowsky agrees to call back Asher Muir at [phone] if he needs help figuring  anything else out today.

## 2015-02-17 ENCOUNTER — Telehealth: Payer: Self-pay | Admitting: Clinical

## 2015-02-17 ENCOUNTER — Ambulatory Visit: Payer: Medicaid Other | Admitting: Family Medicine

## 2015-02-17 NOTE — Telephone Encounter (Signed)
Pt calls, saying he has been taking his medications as prescribed all weekend, but that his stomach is hurting and he wants to know if he should go to the emergency room. Patient informed he has been given an appointment at 1:00pm today at [facility], and says that he will go to his appointment by cab today.

## 2015-02-18 LAB — HIV ANTIBODY (ROUTINE TESTING W REFLEX)

## 2015-02-19 ENCOUNTER — Ambulatory Visit: Payer: Medicaid Other | Admitting: Family Medicine

## 2015-02-20 ENCOUNTER — Encounter: Payer: Self-pay | Admitting: Clinical

## 2015-02-20 NOTE — Progress Notes (Signed)
[name] is currently a [facility] [employer]) client, working with Dolly Rias.

## 2015-02-21 ENCOUNTER — Ambulatory Visit (INDEPENDENT_AMBULATORY_CARE_PROVIDER_SITE_OTHER): Payer: Medicaid Other | Admitting: Family Medicine

## 2015-02-21 ENCOUNTER — Telehealth: Payer: Self-pay | Admitting: Clinical

## 2015-02-21 VITALS — BP 153/81 | HR 67 | Temp 98.2°F | Resp 16 | Ht 67.0 in | Wt 236.0 lb

## 2015-02-21 DIAGNOSIS — N183 Chronic kidney disease, stage 3 unspecified: Secondary | ICD-10-CM

## 2015-02-21 DIAGNOSIS — R7303 Prediabetes: Secondary | ICD-10-CM

## 2015-02-21 DIAGNOSIS — I10 Essential (primary) hypertension: Secondary | ICD-10-CM

## 2015-02-21 DIAGNOSIS — R1084 Generalized abdominal pain: Secondary | ICD-10-CM

## 2015-02-21 DIAGNOSIS — E785 Hyperlipidemia, unspecified: Secondary | ICD-10-CM

## 2015-02-21 LAB — POCT URINALYSIS DIP (DEVICE)
BILIRUBIN URINE: NEGATIVE
Glucose, UA: NEGATIVE mg/dL
KETONES UR: NEGATIVE mg/dL
Leukocytes, UA: NEGATIVE
Nitrite: NEGATIVE
PH: 6 (ref 5.0–8.0)
Specific Gravity, Urine: 1.025 (ref 1.005–1.030)
Urobilinogen, UA: 0.2 mg/dL (ref 0.0–1.0)

## 2015-02-21 NOTE — Progress Notes (Signed)
Subjective:    Patient ID: Richard Barnett, Vernor    DOB: 29-May-1955, 59 y.o.   MRN: [id number]  HPI  [name], a 59 year old [place] with a history of hypertension that  presents for abdominal pain. Patient states that he has had periodic abdominal pain over the past several days. Mr. [name] is not experiencing abdominal pain at present. He describes previous abdominal pain as aching and sharp. He also describes pain as being throughout the entire abdomen. He had not attempted any over the counter interventions to alleviate symptoms. He has not identified any alleviating factors. He states that abdominal pain was occurring primarily after taking medications. He denies headache, fever, fatigue, heartburn, rectal bleeding, nausea, vomiting, diarrhea, or myalgias.     Past Medical History  Diagnosis Date  . Hypertension   . Hypercholesterolemia   . Heart murmur   . CHF (congestive heart failure) (HCC)   . Anginal pain (HCC)   . MI (myocardial infarction) (HCC)     "I've had couple when I was asleep; a couple when I was awake" (08/22/2014)  . DVT (deep venous thrombosis) (HCC) ?    RLE  . Daily headache   . Depression     "I always get that" (08/22/2014)  . Chronic kidney disease     "from my blood pressure problems" (08/22/2014)  . Chronic renal insufficiency     [name] 08/22/2014  . Arthritis     "right leg" (08/22/2014)   There is no immunization history on file for this patient.  Social History   Social History  . Marital Status: Divorced    Spouse Name: N/A  . Number of Children: N/A  . Years of Education: N/A   Occupational History  . Not on file.   Social History Main Topics  . Smoking status: Former Smoker -- 0.00 packs/day for 40 years    Types: Cigarettes    Quit date: 08/29/2010  . Smokeless tobacco: Never Used     Comment: "quit smoking cigarettes in ~ 2011"  . Alcohol Use: No     Comment: 08/22/2014 "stopped in ~ 2011"  . Drug Use: No     Comment: 08/22/2014  "stopped in ~ 2011; all types of drugs""  . Sexual Activity: No   Other Topics Concern  . Not on file   Social History Narrative   Allergies  Allergen Reactions  . Penicillins Swelling and Rash    Swelling of whole body  Has patient had a PCN reaction causing immediate rash, facial/tongue/throat swelling, SOB or lightheadedness with hypotension: No Has patient had a PCN reaction causing severe rash involving mucus membranes or skin necrosis: No Has patient had a PCN reaction that required hospitalization No Has patient had a PCN reaction occurring within the last 10 years: No If all of the above answers are "NO", then may proceed with Cephalosporin use.    Review of Systems  Constitutional: Negative for fever, diaphoresis, fatigue and unexpected weight change.  HENT: Negative.   Eyes: Negative.  Negative for visual disturbance.  Respiratory: Negative.  Negative for chest tightness.   Cardiovascular: Negative.   Gastrointestinal: Negative for nausea, vomiting, abdominal pain, diarrhea, constipation, blood in stool, abdominal distention, anal bleeding and rectal pain.  Endocrine: Negative for polydipsia, polyphagia and polyuria.  Musculoskeletal: Negative.   Skin: Negative.   Allergic/Immunologic: Negative for immunocompromised state.  Neurological: Negative.  Negative for dizziness, tremors and syncope.  Hematological: Negative.   Psychiatric/Behavioral: Negative for suicidal ideas,  behavioral problems, confusion, sleep disturbance and decreased concentration. The patient is nervous/anxious.        Objective:   Physical Exam  Constitutional: He is oriented to person, place, and time. He appears well-developed and well-nourished.  HENT:  Head: Normocephalic and atraumatic.  Right Ear: External ear normal.  Left Ear: External ear normal.  Nose: Nose normal.  Mouth/Throat: Oropharynx is clear and moist.  Eyes: Conjunctivae and EOM are normal. Pupils are equal, round, and  reactive to light.  Neck: Normal range of motion. Neck supple.  Cardiovascular: Normal rate, regular rhythm, normal heart sounds and intact distal pulses.   Pulmonary/Chest: Effort normal and breath sounds normal.  Abdominal: Soft. Bowel sounds are normal. There is no tenderness.  Increased abdominal girth  Musculoskeletal: Normal range of motion.  Neurological: He is alert and oriented to person, place, and time. He has normal reflexes.  Skin: Skin is warm and dry.  Psychiatric: He has a normal mood and affect. His behavior is normal. Judgment and thought content normal.     BP 153/81 mmHg  Pulse 67  Temp(Src) 98.2 F (36.8 C) (Oral)  Resp 16  Ht  (1.702 m)  Wt 236 lb (107.049 kg)  BMI 36.95 kg/m2 Assessment & Plan:   1. Generalized abdominal pain Abdominal pain has not been present over the past 2 days. He evaluated in the clinic on last week for a markedly elevated blood pressure. Mr. Guay and I discussed medication regimen at length. He expressed understanding of medication regimen. Mr. Ramthun continued to take medications that were not on his list. In evaluating patient's medications, he continued to take Lisinopril and atorvastatin 20 mg (which were not on his medication list). He states, "that's what y'all gave me". I called the pharmacy to get a list of recent medications. We discarded Lisinopril, which was discontinued due to history of CKD. Patient reports "the stomach ache" after taking medication, I suspect that he has not been taking medications as prescribed. He has not taken medications this am and is not having abdominal pain at present.   2. Essential hypertension Blood pressure is above goal on current medication regimen. Patient has not been taking medications correctly and did not pick up Amlodipine that was prescribed on last week. Recommend that patient pick up amlodipine from the pharmacy. Pharmacy tech at [facility] ensured that medication  is ready for pick-up.   I numbered all of patient's medications. He has a highlighted copy of medication list. We discarded medications that are not prescribed. Patient counted 5 medication bottles, which were placed in a ziplock bag labeled "Daily Medications". He will match medications with his medication list. The only medication that is not included in the bag is Tylenol #3, which is every 6 hours as needed for previous knee pain. Mr. Salzman expressed understanding of medication regimen. He will follow up in 2 weeks for a blood pressure check and 1 month for hypertension.    3. Chronic kidney disease, stage 3 (moderate) Reviewed urinalysis, proteinuria present. Also, creatinine is 2.37 and GFR is 33. Will send referral to nephrologist for further evaluation. Also, recommend that patient continues anti-hypertensive medications consistently. Patient has had hypertension for many years and he has not been on a consistent regimen.   - Ambulatory referral to Nephrology  4. Hyperlipidemia Patient to take Atorvastatin 40 mg every evening with dinner as prescribed. Also, recommend a low fat diet.   5. Prediabetes Previous hemoglobin A1C is 6.3, which  is consistent with prediabetes. Recommend that patient attends diabetic education. His daughter typically helps to plan meals. I suggest that daughter accompanies him to appointment.  - Ambulatory referral to diabetic education      Over 50% of visit was spent counseling patient on medication regimen    RTC: 2 weeks for blood pressure check and 1 month for hypertension Will notify social worker; patient needs assistance with medications at home.   The patient was given clear instructions to go to ER or return to medical center if symptoms do not improve, worsen or new problems develop. The patient verbalized understanding. Will notify patient with laboratory results. Massie Maroon, FNP

## 2015-02-21 NOTE — Patient Instructions (Addendum)
Will send a referral to diabetes education for prediabetes. He is to take daughter to appointment.   Patient is to only take medications on list as prescribed.   Return to office for a blood pressure check in 2 weeks      DASH Eating Plan DASH stands for "Dietary Approaches to Stop Hypertension." The DASH eating plan is a healthy eating plan that has been shown to reduce high blood pressure (hypertension). Additional health benefits may include reducing the risk of type 2 diabetes mellitus, heart disease, and stroke. The DASH eating plan may also help with weight loss. WHAT DO I NEED TO KNOW ABOUT THE DASH EATING PLAN? For the DASH eating plan, you will follow these general guidelines:  Choose foods with a percent daily value for sodium of less than 5% (as listed on the food label).  Use salt-free seasonings or herbs instead of table salt or sea salt.  Check with your health care provider or pharmacist before using salt substitutes.  Eat lower-sodium products, often labeled as "lower sodium" or "no salt added."  Eat fresh foods.  Eat more vegetables, fruits, and low-fat dairy products.  Choose whole grains. Look for the word "whole" as the first word in the ingredient list.  Choose fish and skinless chicken or [place] more often than red meat. Limit fish, poultry, and meat to 6 oz (170 g) each day.  Limit sweets, desserts, sugars, and sugary drinks.  Choose heart-healthy fats.  Limit cheese to 1 oz (28 g) per day.  Eat more home-cooked food and less restaurant, buffet, and fast food.  Limit fried foods.  Cook foods using methods other than frying.  Limit canned vegetables. If you do use them, rinse them well to decrease the sodium.  When eating at a restaurant, ask that your food be prepared with less salt, or no salt if possible. WHAT FOODS CAN I EAT? Seek help from a dietitian for individual calorie needs. Grains Whole grain or whole wheat bread. Brown rice. Whole  grain or whole wheat pasta. Quinoa, bulgur, and whole grain cereals. Low-sodium cereals. Corn or whole wheat flour tortillas. Whole grain cornbread. Whole grain crackers. Low-sodium crackers. Vegetables Fresh or frozen vegetables (raw, steamed, roasted, or grilled). Low-sodium or reduced-sodium tomato and vegetable juices. Low-sodium or reduced-sodium tomato sauce and paste. Low-sodium or reduced-sodium canned vegetables.  Fruits All fresh, canned (in natural juice), or frozen fruits. Meat and Other Protein Products Ground beef (85% or leaner), grass-fed beef, or beef trimmed of fat. Skinless chicken or [place]. Ground chicken or [place]. Pork trimmed of fat. All fish and seafood. Eggs. Dried beans, peas, or lentils. Unsalted nuts and seeds. Unsalted canned beans. Dairy Low-fat dairy products, such as skim or 1% milk, 2% or reduced-fat cheeses, low-fat ricotta or cottage cheese, or plain low-fat yogurt. Low-sodium or reduced-sodium cheeses. Fats and Oils Tub margarines without trans fats. Light or reduced-fat mayonnaise and salad dressings (reduced sodium). Avocado. Safflower, olive, or canola oils. Natural peanut or almond butter. Other Unsalted popcorn and pretzels. The items listed above may not be a complete list of recommended foods or beverages. Contact your dietitian for more options. WHAT FOODS ARE NOT RECOMMENDED? Grains White bread. White pasta. White rice. Refined cornbread. Bagels and croissants. Crackers that contain trans fat. Vegetables Creamed or fried vegetables. Vegetables in a cheese sauce. Regular canned vegetables. Regular canned tomato sauce and paste. Regular tomato and vegetable juices. Fruits Dried fruits. Canned fruit in light or heavy syrup. Fruit juice. Meat and  Other Protein Products Fatty cuts of meat. Ribs, chicken wings, bacon, sausage, bologna, salami, chitterlings, fatback, hot dogs, bratwurst, and packaged luncheon meats. Salted nuts and seeds. Canned beans with  salt. Dairy Whole or 2% milk, cream, half-and-half, and cream cheese. Whole-fat or sweetened yogurt. Full-fat cheeses or blue cheese. Nondairy creamers and whipped toppings. Processed cheese, cheese spreads, or cheese curds. Condiments Onion and garlic salt, seasoned salt, table salt, and sea salt. Canned and packaged gravies. Worcestershire sauce. Tartar sauce. Barbecue sauce. Teriyaki sauce. Soy sauce, including reduced sodium. Steak sauce. Fish sauce. Oyster sauce. Cocktail sauce. Horseradish. Ketchup and mustard. Meat flavorings and tenderizers. Bouillon cubes. Hot sauce. Tabasco sauce. Marinades. Taco seasonings. Relishes. Fats and Oils Butter, stick margarine, lard, shortening, ghee, and bacon fat. Coconut, palm kernel, or palm oils. Regular salad dressings. Other Pickles and olives. Salted popcorn and pretzels. The items listed above may not be a complete list of foods and beverages to avoid. Contact your dietitian for more information. WHERE CAN I FIND MORE INFORMATION? National Heart, Lung, and Blood Institute: travelstabloid.com   This information is not intended to replace advice given to you by your health care provider. Make sure you discuss any questions you have with your health care provider.   Document Released: 02/11/2011 Document Revised: 03/15/2014 Document Reviewed: 12/27/2012 Elsevier Interactive Patient Education [employer].

## 2015-02-21 NOTE — Telephone Encounter (Signed)
Follow-up with Richard Barnett, he agrees to come into [facility] pharmacy to pick up his medication on 02-24-15.

## 2015-02-22 ENCOUNTER — Encounter: Payer: Self-pay | Admitting: Family Medicine

## 2015-02-22 DIAGNOSIS — R7303 Prediabetes: Secondary | ICD-10-CM | POA: Insufficient documentation

## 2015-02-24 ENCOUNTER — Ambulatory Visit: Payer: Medicaid Other | Attending: Internal Medicine | Admitting: Clinical

## 2015-02-24 DIAGNOSIS — Z659 Problem related to unspecified psychosocial circumstances: Secondary | ICD-10-CM

## 2015-02-24 NOTE — Progress Notes (Signed)
ASSESSMENT: Pt currently experiencing problem related to psychosocial circumstances. He needs to f/u with his PCP; would benefit from continued supportive counseling regarding coping with psychosocial circumstances.  Stage of Change: precontemplative  PLAN: 1. F/U with behavioral health consultant in as needed 2. Psychiatric Medications: none. 3. Behavioral recommendation(s):   -Take medications as prescribed by PCP -Consider allowing daughter to put meds in pill box weekly -Answer phone when [facility] calls  SUBJECTIVE: Pt. referred by self for psychosocial problems:  Pt. reports the following symptoms/concerns: Pt states that he is confused over his medications, says he needs to pick up medication today in [facility] pharmacy, but is uncertain whether or not he has all of his prescribed meds. His daughter bought him healthy groceries this weekend, and he is looking forward to spending time with her and grandchildren over the holidays; has not heard from [facility].  Duration of problem: today Severity: mild  OBJECTIVE: Orientation & Cognition: Oriented x3. Thought processes normal and appropriate to situation. Mood: appropriate  Affect: appropriate Appearance: appropriate (appears healthier and less confused today than in past, possibly because he is eating regularly and taking medications) Risk of harm to self or others: no known risk of harm to self or others today Substance use: none Assessments administered: none  Diagnosis: Problem related to psychosocial circumstances CPT Code: Z65.9 -------------------------------------------- Other(s) present in the room: none  Time spent with patient in exam room: 20 minutes

## 2015-02-25 ENCOUNTER — Encounter (HOSPITAL_COMMUNITY): Payer: Self-pay | Admitting: Emergency Medicine

## 2015-02-25 ENCOUNTER — Observation Stay (HOSPITAL_COMMUNITY)
Admission: EM | Admit: 2015-02-25 | Discharge: 2015-02-25 | Disposition: A | Discharge status: Home / Self Care | Payer: Medicaid Other | Attending: Internal Medicine | Admitting: Internal Medicine

## 2015-02-25 ENCOUNTER — Observation Stay (HOSPITAL_COMMUNITY): Payer: Medicaid Other

## 2015-02-25 ENCOUNTER — Emergency Department (HOSPITAL_COMMUNITY): Payer: Medicaid Other

## 2015-02-25 DIAGNOSIS — Z79899 Other long term (current) drug therapy: Secondary | ICD-10-CM | POA: Insufficient documentation

## 2015-02-25 DIAGNOSIS — Z7982 Long term (current) use of aspirin: Secondary | ICD-10-CM | POA: Diagnosis not present

## 2015-02-25 DIAGNOSIS — N184 Chronic kidney disease, stage 4 (severe): Secondary | ICD-10-CM | POA: Diagnosis present

## 2015-02-25 DIAGNOSIS — I25119 Atherosclerotic heart disease of native coronary artery with unspecified angina pectoris: Secondary | ICD-10-CM | POA: Insufficient documentation

## 2015-02-25 DIAGNOSIS — R0602 Shortness of breath: Secondary | ICD-10-CM | POA: Insufficient documentation

## 2015-02-25 DIAGNOSIS — R0789 Other chest pain: Secondary | ICD-10-CM

## 2015-02-25 DIAGNOSIS — R011 Cardiac murmur, unspecified: Secondary | ICD-10-CM | POA: Insufficient documentation

## 2015-02-25 DIAGNOSIS — Z86718 Personal history of other venous thrombosis and embolism: Secondary | ICD-10-CM | POA: Diagnosis not present

## 2015-02-25 DIAGNOSIS — I252 Old myocardial infarction: Secondary | ICD-10-CM | POA: Insufficient documentation

## 2015-02-25 DIAGNOSIS — I422 Other hypertrophic cardiomyopathy: Secondary | ICD-10-CM | POA: Insufficient documentation

## 2015-02-25 DIAGNOSIS — I130 Hypertensive heart and chronic kidney disease with heart failure and stage 1 through stage 4 chronic kidney disease, or unspecified chronic kidney disease: Secondary | ICD-10-CM | POA: Insufficient documentation

## 2015-02-25 DIAGNOSIS — N183 Chronic kidney disease, stage 3 (moderate): Secondary | ICD-10-CM

## 2015-02-25 DIAGNOSIS — Z87891 Personal history of nicotine dependence: Secondary | ICD-10-CM | POA: Diagnosis not present

## 2015-02-25 DIAGNOSIS — G4700 Insomnia, unspecified: Secondary | ICD-10-CM | POA: Diagnosis not present

## 2015-02-25 DIAGNOSIS — R079 Chest pain, unspecified: Secondary | ICD-10-CM | POA: Diagnosis present

## 2015-02-25 DIAGNOSIS — M1388 Other specified arthritis, other site: Secondary | ICD-10-CM | POA: Insufficient documentation

## 2015-02-25 DIAGNOSIS — D649 Anemia, unspecified: Secondary | ICD-10-CM

## 2015-02-25 DIAGNOSIS — I10 Essential (primary) hypertension: Secondary | ICD-10-CM | POA: Diagnosis not present

## 2015-02-25 DIAGNOSIS — E785 Hyperlipidemia, unspecified: Secondary | ICD-10-CM | POA: Diagnosis not present

## 2015-02-25 DIAGNOSIS — R072 Precordial pain: Principal | ICD-10-CM | POA: Insufficient documentation

## 2015-02-25 DIAGNOSIS — E7800 Pure hypercholesterolemia, unspecified: Secondary | ICD-10-CM | POA: Insufficient documentation

## 2015-02-25 DIAGNOSIS — F329 Major depressive disorder, single episode, unspecified: Secondary | ICD-10-CM | POA: Insufficient documentation

## 2015-02-25 DIAGNOSIS — Z880 Allergy status to penicillin: Secondary | ICD-10-CM | POA: Diagnosis not present

## 2015-02-25 DIAGNOSIS — I5032 Chronic diastolic (congestive) heart failure: Secondary | ICD-10-CM | POA: Insufficient documentation

## 2015-02-25 LAB — D-DIMER, QUANTITATIVE: D-Dimer, Quant: 0.91 ug/mL-FEU — ABNORMAL HIGH (ref 0.00–0.50)

## 2015-02-25 LAB — BASIC METABOLIC PANEL
ANION GAP: 8 (ref 5–15)
BUN: 26 mg/dL — ABNORMAL HIGH (ref 6–20)
CALCIUM: 8.7 mg/dL — AB (ref 8.9–10.3)
CO2: 23 mmol/L (ref 22–32)
Chloride: 110 mmol/L (ref 101–111)
Creatinine, Ser: 2.67 mg/dL — ABNORMAL HIGH (ref 0.61–1.24)
GFR calc non Af Amer: 24 mL/min — ABNORMAL LOW (ref >60)
GFR, EST AFRICAN AMERICAN: 28 mL/min — AB (ref >60)
Glucose, Bld: 110 mg/dL — ABNORMAL HIGH (ref 65–99)
Potassium: 4 mmol/L (ref 3.5–5.1)
Sodium: 141 mmol/L (ref 135–145)

## 2015-02-25 LAB — TROPONIN I
Troponin I: <0.03 ng/mL (ref <0.031)
Troponin I: <0.03 ng/mL (ref <0.031)

## 2015-02-25 LAB — CBC
HCT: 37.4 % — ABNORMAL LOW (ref 39.0–52.0)
HEMOGLOBIN: 12.4 g/dL — AB (ref 13.0–17.0)
MCH: 29.1 pg (ref 26.0–34.0)
MCHC: 33.2 g/dL (ref 30.0–36.0)
MCV: 87.8 fL (ref 78.0–100.0)
Platelets: 246 10*3/uL (ref 150–400)
RBC: 4.26 MIL/uL (ref 4.22–5.81)
RDW: 14.1 % (ref 11.5–15.5)
WBC: 6.3 10*3/uL (ref 4.0–10.5)

## 2015-02-25 MED ORDER — TECHNETIUM TO 99M ALBUMIN AGGREGATED
4.0000 | Freq: Once | INTRAVENOUS | Status: AC | PRN
  Administered 2015-02-25: 4 via INTRAVENOUS

## 2015-02-25 MED ORDER — INFLUENZA VAC SPLIT QUAD 0.5 ML IM SUSY: 0.5000 mL | PREFILLED_SYRINGE | INTRAMUSCULAR | Status: DC

## 2015-02-25 MED ORDER — MORPHINE SULFATE (PF) 2 MG/ML IV SOLN: 2.0000 mg | INTRAVENOUS | Status: DC | PRN

## 2015-02-25 MED ORDER — AMLODIPINE BESYLATE 10 MG PO TABS
10.0000 mg | ORAL_TABLET | Freq: Every day | ORAL | Status: DC
  Administered 2015-02-25: 10 mg via ORAL
  Filled 2015-02-25: qty 1

## 2015-02-25 MED ORDER — HEPARIN SODIUM (PORCINE) 5000 UNIT/ML IJ SOLN
5000.0000 [IU] | Freq: Three times a day (TID) | INTRAMUSCULAR | Status: DC
  Administered 2015-02-25 (×2): 5000 [IU] via SUBCUTANEOUS
  Filled 2015-02-25 (×2): qty 1

## 2015-02-25 MED ORDER — GI COCKTAIL ~~LOC~~: 30.0000 mL | Freq: Four times a day (QID) | ORAL | Status: DC | PRN

## 2015-02-25 MED ORDER — CLONIDINE HCL 0.2 MG PO TABS
0.2000 mg | ORAL_TABLET | Freq: Two times a day (BID) | ORAL | Status: DC
  Administered 2015-02-25: 0.2 mg via ORAL
  Filled 2015-02-25: qty 1

## 2015-02-25 MED ORDER — PNEUMOCOCCAL VAC POLYVALENT 25 MCG/0.5ML IJ INJ: 0.5000 mL | INJECTION | INTRAMUSCULAR | Status: DC

## 2015-02-25 MED ORDER — NITROGLYCERIN 0.4 MG SL SUBL: 0.4000 mg | SUBLINGUAL_TABLET | SUBLINGUAL | Status: DC | PRN

## 2015-02-25 MED ORDER — ASPIRIN EC 81 MG PO TBEC
81.0000 mg | DELAYED_RELEASE_TABLET | Freq: Every day | ORAL | Status: DC
  Administered 2015-02-25: 81 mg via ORAL
  Filled 2015-02-25: qty 1

## 2015-02-25 MED ORDER — ISOSORBIDE MONONITRATE ER 30 MG PO TB24: 15.0000 mg | ORAL_TABLET | Freq: Every day | ORAL | Status: DC

## 2015-02-25 MED ORDER — METOPROLOL TARTRATE 50 MG PO TABS
50.0000 mg | ORAL_TABLET | Freq: Two times a day (BID) | ORAL | Status: DC
  Administered 2015-02-25: 50 mg via ORAL
  Filled 2015-02-25: qty 1

## 2015-02-25 MED ORDER — ACETAMINOPHEN-CODEINE #3 300-30 MG PO TABS: 1.0000 | ORAL_TABLET | Freq: Four times a day (QID) | ORAL | Status: DC | PRN

## 2015-02-25 MED ORDER — SODIUM CHLORIDE 0.9 % IV SOLN
INTRAVENOUS | Status: AC
  Administered 2015-02-25: 05:00:00 via INTRAVENOUS

## 2015-02-25 MED ORDER — TECHNETIUM TC 99M DIETHYLENETRIAME-PENTAACETIC ACID: 31.0000 | Freq: Once | INTRAVENOUS | Status: DC | PRN

## 2015-02-25 MED ORDER — ACETAMINOPHEN 325 MG PO TABS: 650.0000 mg | ORAL_TABLET | ORAL | Status: DC | PRN

## 2015-02-25 MED ORDER — ONDANSETRON HCL 4 MG/2ML IJ SOLN: 4.0000 mg | Freq: Four times a day (QID) | INTRAMUSCULAR | Status: DC | PRN

## 2015-02-25 MED ORDER — ATORVASTATIN CALCIUM 40 MG PO TABS: 40.0000 mg | ORAL_TABLET | Freq: Every day | ORAL | Status: DC

## 2015-02-25 NOTE — Progress Notes (Signed)
Patient alert and oriented x4 upon discharge. Left with daughter and had home health RN set up prior to discharge.  Telemetry box cleaned and returned to nurses station.

## 2015-02-25 NOTE — H&P (Signed)
[facility] History and Physical  Mel Peri YZJ:[id number] DOB: 1955-03-16 DOA: 02/25/2015  Referring physician: ED PCP: No PCP Per Patient   Chief Complaint: Chest pain  HPI:  [name] is a 59 year old [place] with a past medical history significant for CADs/p stent, HTN, CHF last EF 55-60% with grade 1 diastolic dysfunction in 05/2014, MI, DVT chronic kidney disease stage III, remote history of tobacco abuse, and HLD; who presents with complaints of onset of left substernal chest pain. Pain was described as sharp and squeezing. Symptoms started early this morning awaking patient from sleep. Patient reports pain being a 10 out of 10. He reported associated symptoms of shortness of breath and some nausea denies any vomiting. Patient reportedly immediately call 911 as the pain felt similar to previous pain when he had a heart attack. After EMS arrived patient notes that they gave him 4 baby aspirin and a nitroglycerin prior to arriving to the emergency department which greatly improved his symptoms. At this time he reports he is chest pain-free. Patient denies radiation of chest pain symptoms. Denies vomiting, fever, chills, abdominal pain, diaphoresis.   Upon arrival to the emergency department and initial troponins were found to be negative and initial EKG shows sinus rhythm with T-wave inversion.  Review of Systems  Constitutional: Negative for fever, chills and diaphoresis.  HENT: Negative for ear pain and tinnitus.   Eyes: Negative for double vision and photophobia.  Respiratory: Positive for shortness of breath. Negative for cough.   Cardiovascular: Positive for chest pain and palpitations. Negative for orthopnea, leg swelling and PND.  Gastrointestinal: Negative for abdominal pain and diarrhea.  Genitourinary: Negative for urgency and frequency.  Musculoskeletal: Positive for joint pain. Negative for neck pain.  Skin: Negative for itching and rash.  Psychiatric/Behavioral:  Negative for suicidal ideas and substance abuse.     Past Medical History  Diagnosis Date  . Hypertension   . Hypercholesterolemia   . Heart murmur   . CHF (congestive heart failure) (HCC)   . Anginal pain (HCC)   . MI (myocardial infarction) (HCC)     "I've had couple when I was asleep; a couple when I was awake" (08/22/2014)  . DVT (deep venous thrombosis) (HCC) ?    RLE  . Daily headache   . Depression     "I always get that" (08/22/2014)  . Chronic kidney disease     "from my blood pressure problems" (08/22/2014)  . Chronic renal insufficiency     [name] 08/22/2014  . Arthritis     "right leg" (08/22/2014)     Past Surgical History  Procedure Laterality Date  . Coronary angioplasty with stent placement      "1 + 1"      Social History:  reports that he quit smoking about 4 years ago. His smoking use included Cigarettes. He smoked 0.00 packs per day for 40 years. He has never used smokeless tobacco. He reports that he does not drink alcohol or use illicit drugs. Where does patient live--home Can patient participate in ADLs? yes  Allergies  Allergen Reactions  . Penicillins Swelling and Rash    Swelling of whole body  Has patient had a PCN reaction causing immediate rash, facial/tongue/throat swelling, SOB or lightheadedness with hypotension: No Has patient had a PCN reaction causing severe rash involving mucus membranes or skin necrosis: No Has patient had a PCN reaction that required hospitalization No Has patient had a PCN reaction occurring within the last 10 years: No If  all of the above answers are "NO", then may proceed with Cephalosporin use.     Family History  Problem Relation Age of Onset  . Hypertension Mother   . Kidney disease Mother   . Heart disease Mother   . Heart disease Father   . Hypertension Father        Prior to Admission medications   Medication Sig Start Date End Date Taking? Authorizing Provider  acetaminophen-codeine (TYLENOL  #3) 300-30 MG tablet Take 1 tablet by mouth every 6 (six) hours as needed for moderate pain. 02/11/15  Yes Massie Maroon, FNP  amLODipine (NORVASC) 10 MG tablet Take 1 tablet (10 mg total) by mouth daily. 02/11/15  Yes Massie Maroon, FNP  aspirin 81 MG EC tablet Take 1 tablet (81 mg total) by mouth daily. 02/11/15  Yes Massie Maroon, FNP  atorvastatin (LIPITOR) 40 MG tablet Take 1 tablet (40 mg total) by mouth daily at 6 PM. 02/11/15  Yes Massie Maroon, FNP  cloNIDine (CATAPRES) 0.2 MG tablet Take 1 tablet (0.2 mg total) by mouth 2 (two) times daily. 02/11/15  Yes Massie Maroon, FNP  metoprolol (LOPRESSOR) 50 MG tablet Take 1 tablet (50 mg total) by mouth 2 (two) times daily. 02/11/15  Yes Massie Maroon, FNP     Physical Exam: Filed Vitals:   02/25/15 0245 02/25/15 0300 02/25/15 0315 02/25/15 0330  BP: 155/91 148/88 152/87 120/79  Pulse: 61 57 60 60  Resp:    7  Height:      Weight:      SpO2: 96% 97% 97% 100%     Constitutional: Vital signs reviewed. Patient is a obese Richard Barnett  in no acute distress and cooperative with exam. Alert and oriented x3.  Head: Normocephalic and atraumatic  Ear: TM normal bilaterally  Mouth: no erythema or exudates, MMM  Eyes: PERRL, EOMI, conjunctivae normal, No scleral icterus.  Neck: Supple, Trachea midline normal ROM, No JVD, mass, thyromegaly, or carotid bruit present.  Cardiovascular: RRR, S1 normal, S2 normal, +SEM , pulses symmetric and intact bilaterally  Pulmonary/Chest: CTAB, no wheezes, rales, or rhonchi  Abdominal: Soft. Non-tender, non-distended, bowel sounds are normal, no masses, organomegaly, or guarding present.  GU: no CVA tenderness Musculoskeletal: No joint deformities, erythema, or stiffness, ROM full and no nontender Ext: no edema and no cyanosis, pulses palpable bilaterally (DP and PT)  Hematology: no cervical, inginal, or axillary adenopathy.  Neurological: A&O x3, Strenght is normal and symmetric bilaterally, cranial  nerve II-XII are grossly intact, no focal motor deficit, sensory intact to light touch bilaterally.  Skin: Warm, dry and intact. No rash, cyanosis, or clubbing.  Psychiatric: Normal mood and affect. speech and behavior is normal. Judgment and thought content normal. Cognition and memory are normal.      Data Review   Micro Results No results found for this or any previous visit (from the past 240 hour(s)).  Radiology Reports Dg Chest 2 View  02/25/2015  CLINICAL DATA:  Chest pain EXAM: CHEST  2 VIEW COMPARISON:  08/28/2014 FINDINGS: No cardiomegaly when accounting for probable apical fat pad. Stable aortic tortuosity. Low volumes with interstitial crowding/ subsegmental atelectasis at the bases. No edema, effusion, or pneumothorax. No osseous finding to explain acute chest pain. IMPRESSION: Hypoventilation without edema or suspected pneumonia. Electronically Signed   By: Marnee Spring M.D.   On: 02/25/2015 01:40     CBC  Recent Labs Lab 02/25/15 0108  WBC 6.3  HGB 12.4*  HCT  37.4*  PLT 246  MCV 87.8  MCH 29.1  MCHC 33.2  RDW 14.1    Chemistries   Recent Labs Lab 02/25/15 0108  NA 141  K 4.0  CL 110  CO2 23  GLUCOSE 110*  BUN 26*  CREATININE 2.67*  CALCIUM 8.7*   ------------------------------------------------------------------------------------------------------------------ estimated creatinine clearance is 33.9 mL/min (by C-G formula based on Cr of 2.67). ------------------------------------------------------------------------------------------------------------------ No results for input(s): HGBA1C in the last 72 hours. ------------------------------------------------------------------------------------------------------------------ No results for input(s): CHOL, HDL, LDLCALC, TRIG, CHOLHDL, LDLDIRECT in the last 72 hours. ------------------------------------------------------------------------------------------------------------------ No results for  input(s): TSH, T4TOTAL, T3FREE, THYROIDAB in the last 72 hours.  Invalid input(s): FREET3 ------------------------------------------------------------------------------------------------------------------ No results for input(s): VITAMINB12, FOLATE, FERRITIN, TIBC, IRON, RETICCTPCT in the last 72 hours.  Coagulation profile No results for input(s): INR, PROTIME in the last 168 hours.  No results for input(s): DDIMER in the last 72 hours.  Cardiac Enzymes  Recent Labs Lab 02/25/15 0108  TROPONINI <0.03   ------------------------------------------------------------------------------------------------------------------ Invalid input(s): POCBNP   CBG: No results for input(s): GLUCAP in the last 168 hours.     EKG: Independently reviewed. Sinus rhythm with T-wave inversions   Assessment/Plan Active Problems:    Chest pain:  acute. Patient states symptoms will come out of sleep. Patient with multiple risk factors including hypertension, hyperlipidemia, coronary artery disease with previous stenting. Initial troponins negative. EKG showing T-wave inversions. Patient with a heart score of approximately 6. - Troponins 3 - Checking d-dimer - Echocardiogram and EKG in a.m. - Nitroglycerin/ morphine prn chest pain - Patient NPO after midnight for possible procedure in a.m. - Emailed Salley Hews for cardiology to see in a.m.  Hypertension  - Continue home medications of metoprolol, clonidine, and Norvasc  Hyperlipidemia last cholesterol panel and all performed 6 months ago showed total cholesterol 259, triglycerides 338, HDL 33, and LDL 158. Patient not at goal. - check lipid panel - Continue home medications of atorvastatin    CKD (chronic kidney disease), stage III: Patient appears to be near baseline  creatinine which ranges from 2-2.4 . On admission creatinine 2.67. - Gentle IV fluid hydration  - Continue to monitor kidney function  Anemia: H&H 12.4 and 34.4  respectively. -Continue to monitor  Heparin for DVT prophylaxis   Code Status:   full Family Communication: bedside Disposition Plan: admit   Total time spent 55 minutes.Greater than 50% of this time was spent in counseling, explanation of diagnosis, planning of further management, and coordination of care  Clydie Braun [facility] Pager [phone]  If 7PM-7AM, please contact night-coverage [email or web address] Password [email or web address] 02/25/2015, 4:11 AM

## 2015-02-25 NOTE — Discharge Summary (Signed)
[facility]  Physician Discharge Summary   Patient ID: Richard Barnett MRN: [id number] DOB/AGE: [birth year] 59 y.o.  Admit date: 02/25/2015 Discharge date: 02/25/2015  PCP: Massie Maroon, FNP  DISCHARGE DIAGNOSES:  Principal Problem:   Chest pain Active Problems:   Hypertension   Hyperlipidemia   CKD (chronic kidney disease), stage III   RECOMMENDATIONS FOR OUTPATIENT FOLLOW UP: 1. Patient may benefit from a referral to nephrology for his seemingly chronic kidney disease 2. Patient will also have follow-up with cardiology   DISCHARGE CONDITION: fair  Diet recommendation: Low-sodium heart healthy  Filed Weights   02/25/15 0023 02/25/15 0418  Weight: 104.327 kg (230 lb) 104.191 kg (229 lb 11.2 oz)    INITIAL HISTORY: 59 year old African-American Richard Barnett with a past medical history of coronary artery disease status post stent placement, hypertension, chronic kidney disease stage III, presented with left-sided chest pain. Patient was hospitalized for further management.  Consultations:  Cardiology  Procedures:  None  HOSPITAL COURSE:   Chest pain Symptoms have resolved. EKG showed some T-wave inversions. Patient has ruled out for acute coronary syndrome by serial troponin. D-dimer was mildly elevated. VQ scan is negative for PE. Patient seen by cardiology. Patient had a stress test and echocardiogram back in March. Both of these were low risk. In view of these test results he does not need any further cardiac workup at this time. He may benefit from low-dose nitrate, which has been prescribed as recommended by cardiology. He should continue with his aspirin.  History of coronary artery disease. He apparently had 3 stents placed when he was in [place] about 3 years ago. Continue medical management for now.  History of essential hypertension. Continue home medications. Blood pressure is reasonably well controlled.  History of hyperlipidemia. Continue with  statin.  Chronic kidney disease stage III. Patient's renal function is at baseline. He may benefit from being referred to a nephrologist. This will be deferred to his outpatient providers.  Normocytic anemia Stable.  Overall, stable. Symptoms have resolved. Cleared by cardiology. Okay for discharge today. Discussed with the patient  PERTINENT LABS:  The results of significant diagnostics from this hospitalization (including imaging, microbiology, ancillary and laboratory) are listed below for reference.     Labs: Basic Metabolic Panel:  Recent Labs Lab 02/25/15 0108  NA 141  K 4.0  CL 110  CO2 23  GLUCOSE 110*  BUN 26*  CREATININE 2.67*  CALCIUM 8.7*   CBC:  Recent Labs Lab 02/25/15 0108  WBC 6.3  HGB 12.4*  HCT 37.4*  MCV 87.8  PLT 246   Cardiac Enzymes:  Recent Labs Lab 02/25/15 0108 02/25/15 0442 02/25/15 0655 02/25/15 1038  TROPONINI <0.03 <0.03 <0.03 <0.03     IMAGING STUDIES Dg Chest 2 View  02/25/2015  CLINICAL DATA:  Chest pain EXAM: CHEST  2 VIEW COMPARISON:  08/28/2014 FINDINGS: No cardiomegaly when accounting for probable apical fat pad. Stable aortic tortuosity. Low volumes with interstitial crowding/ subsegmental atelectasis at the bases. No edema, effusion, or pneumothorax. No osseous finding to explain acute chest pain. IMPRESSION: Hypoventilation without edema or suspected pneumonia. Electronically Signed   By: Marnee Spring M.D.   On: 02/25/2015 01:40   Nm Pulmonary Perf And Vent  02/25/2015  CLINICAL DATA:  Chest pain and shortness of breath. Evaluate for pulmonary embolism. EXAM: NUCLEAR MEDICINE VENTILATION - PERFUSION LUNG SCAN TECHNIQUE: Ventilation images were obtained in multiple projections using inhaled aerosol Tc-71m DTPA. Perfusion images were obtained in multiple projections after intravenous injection of  Tc-56m MAA. RADIOPHARMACEUTICALS:  31 Technetium-47m DTPA aerosol inhalation and 4.1 Technetium-80m MAA IV COMPARISON:   Chest radiograph-earlier same day ; 08/28/2014; 08/22/2014 FINDINGS: Review of chest radiograph performed earlier same day demonstrates similar findings of hypoventilation and borderline cardiomegaly. Grossly unchanged bibasilar heterogeneous opacities favored to represent atelectasis or scar. No pleural effusion or pneumothorax. No evidence of edema. Ventilation: There is relative homogeneous distribution of inhaled radiotracer throughout the bilateral pulmonary parenchyma. No discrete areas of non are decreased ventilation. A minimal amount of ingested radiotracer seen within the stomach. Perfusion: There is homogeneous distribution of injected radiotracer without discrete segmental or subsegmental mismatched filling defect to suggest pulmonary embolism. IMPRESSION: Pulmonary embolism absent (normal VQ scan). Electronically Signed   By: Simonne Come M.D.   On: 02/25/2015 15:33    DISCHARGE EXAMINATION: Filed Vitals:   02/25/15 0330 02/25/15 0418 02/25/15 1003 02/25/15 1300  BP: 120/79 133/77 149/79 153/82  Pulse: 60 55 56 52  Temp:  97.5 F (36.4 C)  97.5 F (36.4 C)  TempSrc:  Oral  Oral  Resp: 7   15  Height:      Weight:  104.191 kg (229 lb 11.2 oz)    SpO2: 100% 98%  100%   General appearance: alert, cooperative, appears stated age and no distress Resp: clear to auscultation bilaterally Cardio: regular rate and rhythm, S1, S2 normal, no murmur, click, rub or gallop GI: soft, non-tender; bowel sounds normal; no masses,  no organomegaly Extremities: extremities normal, atraumatic, no cyanosis or edema  DISPOSITION: Home  Discharge Instructions    Call MD for:  difficulty breathing, headache or visual disturbances    Complete by:  As directed      Call MD for:  extreme fatigue    Complete by:  As directed      Call MD for:  persistant dizziness or light-headedness    Complete by:  As directed      Call MD for:  persistant nausea and vomiting    Complete by:  As directed      Call  MD for:  severe uncontrolled pain    Complete by:  As directed      Call MD for:  temperature >100.4    Complete by:  As directed      Diet - low sodium heart healthy    Complete by:  As directed      Discharge instructions    Complete by:  As directed   Please follow-up with her primary care physician regarding your kidney disease. You may need a referral to see a kidney doctor. The heart doctor's office will schedule follow-up appointment. Please seek attention if you develop chest pain again.  You were cared for by a hospitalist during your hospital stay. If you have any questions about your discharge medications or the care you received while you were in the hospital after you are discharged, you can call the unit and asked to speak with the hospitalist on call if the hospitalist that took care of you is not available. Once you are discharged, your primary care physician will handle any further medical issues. Please note that NO REFILLS for any discharge medications will be authorized once you are discharged, as it is imperative that you return to your primary care physician (or establish a relationship with a primary care physician if you do not have one) for your aftercare needs so that they can reassess your need for medications and monitor your lab values. If you  do not have a primary care physician, you can call [phone] for a physician referral.     Increase activity slowly    Complete by:  As directed            ALLERGIES:  Allergies  Allergen Reactions  . Penicillins Swelling and Rash    Swelling of whole body  Has patient had a PCN reaction causing immediate rash, facial/tongue/throat swelling, SOB or lightheadedness with hypotension: No Has patient had a PCN reaction causing severe rash involving mucus membranes or skin necrosis: No Has patient had a PCN reaction that required hospitalization No Has patient had a PCN reaction occurring within the last 10 years: No If all of  the above answers are "NO", then may proceed with Cephalosporin use.       Current Discharge Medication List    START taking these medications   Details  isosorbide mononitrate (IMDUR) 30 MG 24 hr tablet Take 0.5 tablets (15 mg total) by mouth daily. Qty: 30 tablet, Refills: 1    nitroGLYCERIN (NITROSTAT) 0.4 MG SL tablet Place 1 tablet (0.4 mg total) under the tongue every 5 (five) minutes as needed for chest pain. Qty: 30 tablet, Refills: 3      CONTINUE these medications which have NOT CHANGED   Details  acetaminophen-codeine (TYLENOL #3) 300-30 MG tablet Take 1 tablet by mouth every 6 (six) hours as needed for moderate pain. Qty: 30 tablet, Refills: 0   Associated Diagnoses: Right knee pain    amLODipine (NORVASC) 10 MG tablet Take 1 tablet (10 mg total) by mouth daily. Qty: 30 tablet, Refills: 1   Associated Diagnoses: Essential hypertension    aspirin 81 MG EC tablet Take 1 tablet (81 mg total) by mouth daily. Qty: 30 tablet, Refills: 1   Associated Diagnoses: Essential hypertension    atorvastatin (LIPITOR) 40 MG tablet Take 1 tablet (40 mg total) by mouth daily at 6 PM. Qty: 30 tablet, Refills: 1   Associated Diagnoses: Hyperlipidemia; Essential hypertension    cloNIDine (CATAPRES) 0.2 MG tablet Take 1 tablet (0.2 mg total) by mouth 2 (two) times daily. Qty: 60 tablet, Refills: 1   Associated Diagnoses: Essential hypertension    metoprolol (LOPRESSOR) 50 MG tablet Take 1 tablet (50 mg total) by mouth 2 (two) times daily. Qty: 60 tablet, Refills: 1   Associated Diagnoses: Essential hypertension       Follow-up Information    Follow up with Massie Maroon, FNP. Schedule an appointment as soon as possible for a visit in 1 week.   Specialty:  Family Medicine   Why:  post hospitalization follow up and to discuss referral to a kidney doctor   Contact information:   [address] [place] [address] [phone]       Follow up with Lars Masson, MD.   Specialty:  Cardiology   Why:  Heart doctor's office will call you to schedule follow-up   Contact information:   [address] [place] [address] [phone]       TOTAL DISCHARGE TIME: 35 minutes  [facility]  [facility] Pager [phone]  02/25/2015, 3:46 PM

## 2015-02-25 NOTE — ED Notes (Signed)
Internal medicine physician at bedside at this time.

## 2015-02-25 NOTE — Progress Notes (Signed)
Call and got report from R.N. Awaiting patient arrival to floor.

## 2015-02-25 NOTE — ED Notes (Signed)
Dr. Nanavati at bedside at this time.  

## 2015-02-25 NOTE — ED Notes (Signed)
Patient transported to X-ray 

## 2015-02-25 NOTE — ED Provider Notes (Addendum)
CSN: [id number]     Arrival date & time 02/25/15  0009 History   By signing my name below, I, Arlan Organ, attest that this documentation has been prepared under the direction and in the presence of Derwood Kaplan, MD.  Electronically Signed: Arlan Organ, ED Scribe. 02/25/2015. 2:34 AM.   Chief Complaint  Patient presents with  . Chest Pain   The history is provided by the patient. No language interpreter was used.    HPI Comments: Richard Barnett brought in by EMS is a 59 y.o. [name] with a PMHx of HTN, CHF, MI, anginal pain, DVT, and chronic renal insufficiency who presents to the Emergency Department complaining of constant, ongoing substernal chest pain that woke him from sleep this evening. Pain is described as sharp. Currently he is pain free and states episode "lasted a long time" when it was present. Pt also reports shortness of breath and nausea. No aggravating or alleviating factors at this time. 4 baby ASA and 1 Nitro given en route to department with improvement. No recent fever, chills, vomiting, diaphoresis, or abdominal pain. He denies any illicit drug use. Pt states current symptoms feel different from previous MI. PSHx includes coronary angioplasty with stent placement 1-2 years ago not performed in [place].  PCP: No PCP Per Patient    Past Medical History  Diagnosis Date  . Hypertension   . Hypercholesterolemia   . Heart murmur   . CHF (congestive heart failure) (HCC)   . Anginal pain (HCC)   . MI (myocardial infarction) (HCC)     "I've had couple when I was asleep; a couple when I was awake" (08/22/2014)  . DVT (deep venous thrombosis) (HCC) ?    RLE  . Daily headache   . Depression     "I always get that" (08/22/2014)  . Chronic kidney disease     "from my blood pressure problems" (08/22/2014)  . Chronic renal insufficiency     [name] 08/22/2014  . Arthritis     "right leg" (08/22/2014)   Past Surgical History  Procedure Laterality Date  . Coronary  angioplasty with stent placement      "1 + 1"   Family History  Problem Relation Age of Onset  . Hypertension Mother   . Kidney disease Mother   . Heart disease Mother   . Heart disease Father   . Hypertension Father    Social History  Substance Use Topics  . Smoking status: Former Smoker -- 0.00 packs/day for 40 years    Types: Cigarettes    Quit date: 08/29/2010  . Smokeless tobacco: Never Used     Comment: "quit smoking cigarettes in ~ 2011"  . Alcohol Use: No     Comment: 08/22/2014 "stopped in ~ 2011"    Review of Systems  Constitutional: Negative for fever and chills.  Respiratory: Positive for shortness of breath. Negative for cough.   Cardiovascular: Positive for chest pain.  Gastrointestinal: Positive for nausea. Negative for vomiting and abdominal pain.  Musculoskeletal: Negative for back pain.  Neurological: Negative for weakness and numbness.  Psychiatric/Behavioral: Negative for confusion.  All other systems reviewed and are negative.     Allergies  Penicillins  Home Medications   Prior to Admission medications   Medication Sig Start Date End Date Taking? Authorizing Provider  acetaminophen-codeine (TYLENOL #3) 300-30 MG tablet Take 1 tablet by mouth every 6 (six) hours as needed for moderate pain. 02/11/15  Yes Massie Maroon, FNP  amLODipine (NORVASC) 10  MG tablet Take 1 tablet (10 mg total) by mouth daily. 02/11/15  Yes Massie Maroon, FNP  aspirin 81 MG EC tablet Take 1 tablet (81 mg total) by mouth daily. 02/11/15  Yes Massie Maroon, FNP  atorvastatin (LIPITOR) 40 MG tablet Take 1 tablet (40 mg total) by mouth daily at 6 PM. 02/11/15  Yes Massie Maroon, FNP  cloNIDine (CATAPRES) 0.2 MG tablet Take 1 tablet (0.2 mg total) by mouth 2 (two) times daily. 02/11/15  Yes Massie Maroon, FNP  metoprolol (LOPRESSOR) 50 MG tablet Take 1 tablet (50 mg total) by mouth 2 (two) times daily. 02/11/15  Yes Massie Maroon, FNP   Triage Vitals: BP 147/87 mmHg   Pulse 59  Resp 13  Ht  (1.702 m)  Wt 230 lb (104.327 kg)  BMI 36.01 kg/m2  SpO2 98%   Physical Exam  Constitutional: He is oriented to person, place, and time. He appears well-developed and well-nourished.  HENT:  Head: Normocephalic and atraumatic.  Eyes: EOM are normal.  Neck: Normal range of motion. No JVD present.  Cardiovascular: Normal rate, regular rhythm and intact distal pulses.   Murmur heard. Pulses:      Radial pulses are 2+ on the right side, and 2+ on the left side.  Positive systolic murmur  Pulmonary/Chest: Effort normal and breath sounds normal. No respiratory distress.  Abdominal: Soft. Bowel sounds are normal. He exhibits no distension and no mass. There is no tenderness. There is no rebound and no guarding.  Musculoskeletal: Normal range of motion. He exhibits no edema.  Neurological: He is alert and oriented to person, place, and time.  Skin: Skin is warm and dry.  Psychiatric: He has a normal mood and affect. Judgment normal.  Nursing note and vitals reviewed.   ED Course  Procedures (including critical care time)  DIAGNOSTIC STUDIES: Oxygen Saturation is 99% on RA, Normal by my interpretation.    COORDINATION OF CARE: 2:25 AM- Will order CXR, CBC, BMP, and EKG. Discussed treatment plan with pt at bedside and pt agreed to plan.    Labs Review Labs Reviewed  BASIC METABOLIC PANEL - Abnormal; Notable for the following:    Glucose, Bld 110 (*)    BUN 26 (*)    Creatinine, Ser 2.67 (*)    Calcium 8.7 (*)    GFR calc non Af Amer 24 (*)    GFR calc Af Amer 28 (*)    All other components within normal limits  CBC - Abnormal; Notable for the following:    Hemoglobin 12.4 (*)    HCT 37.4 (*)    All other components within normal limits  TROPONIN I    Imaging Review Dg Chest 2 View  02/25/2015  CLINICAL DATA:  Chest pain EXAM: CHEST  2 VIEW COMPARISON:  08/28/2014 FINDINGS: No cardiomegaly when accounting for probable apical fat pad. Stable  aortic tortuosity. Low volumes with interstitial crowding/ subsegmental atelectasis at the bases. No edema, effusion, or pneumothorax. No osseous finding to explain acute chest pain. IMPRESSION: Hypoventilation without edema or suspected pneumonia. Electronically Signed   By: Marnee Spring M.D.   On: 02/25/2015 01:40   I have personally reviewed and evaluated these images and lab results as part of my medical decision-making.   EKG Interpretation   Date/Time:  Tuesday February 25 2015 00:25:21 EST Ventricular Rate:  63 PR Interval:  168 QRS Duration: 82 QT Interval:  433 QTC Calculation: 443 R Axis:   20  Text Interpretation:  Incomplete analysis due to missing data in  precordial lead(s) Sinus rhythm Abnormal T, consider ischemia, diffuse  leads Minimal ST elevation, anterior leads No significant change since  last tracing Confirmed by Rhunette Croft, MD, Ireene Ballowe [phone]) on 02/25/2015  12:31:01 AM      MDM   Final diagnoses:  Chest pain of uncertain etiology    I personally performed the services described in this documentation, which was scribed in my presence. The recorded information has been reviewed and is accurate.  Pt comes in with cc of chest pain. Pt has no chest pain currently. He reports L sided chest pain - the same site as his MI pain. Chest pain resolved when EMS gave him nitro and aspirin. Pt is extremely vague about the history. He is unable to tell me where he had CAD, when he had CAD, what symptoms he was having when he had CAD clearly or with consistency. He does report that stents were placed in his heart - so w. CAD hx and risk factors - we will admit him as chest pain obs.   Derwood Kaplan, MD 02/25/15 4401  Derwood Kaplan, MD 02/25/15 [phone]

## 2015-02-25 NOTE — Discharge Instructions (Signed)

## 2015-02-25 NOTE — ED Notes (Signed)
Patient arrived via [facility]. EMS reports: patient awakened by chest pain, accompanied by shortness of breath and nausea. Chest pain midsternal. 10/10. Called 911. EMS administered ASA 81 mg x 4 & 1 NTG. Pain decreased to 0/10 with medication and position change. BP 185/110 at arrival. Upon arrival to ED, BP 144/80, Pulse 70, 100% on room air. 20 gauge in L AC. Patient reports pain beginning to return - 2/10.

## 2015-02-25 NOTE — Care Management Note (Signed)
Case Management Note  Patient Details  Name: Richard Barnett MRN: [id number] Date of Birth: 04/13/1955  Subjective/Objective:     Pt admitted with chest pain               Action/Plan:  Pt is from home alone independent.  Pt daughter is concerned with pt taking medications consistently, pt has been "forgetful" at times regarding his medications, pt agrees.  CM requested HHRN for medication management.   Expected Discharge Date:                  Expected Discharge Plan:  Home w Home Health Services  In-House Referral:     Discharge planning Services  CM Consult  Post Acute Care Choice:    Choice offered to:     DME Arranged:    DME Agency:     HH Arranged:  RN HH Agency:  [facility]  Status of Service:  Completed, signed off  Medicare Important Message Given:    Date Medicare IM Given:    Medicare IM give by:    Date Additional Medicare IM Given:    Additional Medicare Important Message give by:     If discussed at Long Length of Stay Meetings, dates discussed:    Additional Comments: CM offered choice, pt chose [facility], agency contacted and referral accepted Cherylann Parr, RN 02/25/2015, 4:53 PM

## 2015-02-25 NOTE — Consult Note (Signed)
Admit date: 02/25/2015 Referring Physician  Dr. Barnie Del Primary Physician No PCP Per Patient Primary Cardiologist  Dr. Eloy End Reason for Consultation  Chest pain  HPI: 59 year old Richard Barnett with history of coronary disease, hypertension previously seen by Dr. Delton See on 05/22/14 here with another episode of chest discomfort.  He moved from [place] earlier in 2016 where he had 2 stents placed in 2013. He previously was taking aspirin and Plavix as well as antihypertensive medication but stated during this encounter that they do not work because they do not help him sleep.  He stopped smoking in 2009, family history of early CAD with mother heart attack 24, father 69, both deceased.  Nuclear stress test was done on 05/24/14 which showed no reversible ischemia or infarction, ejection fraction 56%. Low risk stress test findings.  I do not see any office visit encounters cardiology.  Has a prior history of DVT, chronic kidney disease stage III. Came in with left substernal chest discomfort, sharp, somewhat squeezing once again awakening him from sleep that was 10 over 10 in intensity originally with perhaps shortness of breath and nausea but no vomiting. He called 911 because he felt like this was similar pain like he had when he had his heart attack. Nitroglycerin was administered and this alleviated his symptoms.  He was chest pain-free when he came to the emergency room. Troponin serially were negative. EKG sinus rhythm, T-wave inversion unchanged, quite diffuse which could be interpreted for ischemia however EKG from 05/28/14 is unchanged. Findings consistent with hypertrophic cardiomyopathy.  Currently he is resting, bleeding bed. He did state that nitroglycerin gave him a headache. He was unaware of his chronic kidney disease he states. His daughter is trying to help him with his food choices.   PMH:   Past Medical History  Diagnosis Date  . Hypertension   . Hypercholesterolemia   .  Heart murmur   . CHF (congestive heart failure) (HCC)   . Anginal pain (HCC)   . MI (myocardial infarction) (HCC)     "I've had couple when I was asleep; a couple when I was awake" (08/22/2014)  . DVT (deep venous thrombosis) (HCC) ?    RLE  . Daily headache   . Depression     "I always get that" (08/22/2014)  . Chronic kidney disease     "from my blood pressure problems" (08/22/2014)  . Chronic renal insufficiency     Richard Barnett 08/22/2014  . Arthritis     "right leg" (08/22/2014)    PSH:   Past Surgical History  Procedure Laterality Date  . Coronary angioplasty with stent placement      "1 + 1"   Allergies:  Penicillins Prior to Admit Meds:   Prior to Admission medications   Medication Sig Start Date End Date Taking? Authorizing Provider  acetaminophen-codeine (TYLENOL #3) 300-30 MG tablet Take 1 tablet by mouth every 6 (six) hours as needed for moderate pain. 02/11/15  Yes Massie Maroon, FNP  amLODipine (NORVASC) 10 MG tablet Take 1 tablet (10 mg total) by mouth daily. 02/11/15  Yes Massie Maroon, FNP  aspirin 81 MG EC tablet Take 1 tablet (81 mg total) by mouth daily. 02/11/15  Yes Massie Maroon, FNP  atorvastatin (LIPITOR) 40 MG tablet Take 1 tablet (40 mg total) by mouth daily at 6 PM. 02/11/15  Yes Massie Maroon, FNP  cloNIDine (CATAPRES) 0.2 MG tablet Take 1 tablet (0.2 mg total) by mouth 2 (two) times daily.  02/11/15  Yes Massie Maroon, FNP  metoprolol (LOPRESSOR) 50 MG tablet Take 1 tablet (50 mg total) by mouth 2 (two) times daily. 02/11/15  Yes Massie Maroon, FNP   Fam HX:    Family History  Problem Relation Age of Onset  . Hypertension Mother   . Kidney disease Mother   . Heart disease Mother   . Heart disease Father   . Hypertension Father    Social HX:    Social History   Social History  . Marital Status: Divorced    Spouse Name: N/A  . Number of Children: N/A  . Years of Education: N/A   Occupational History  . Not on file.   Social History  Main Topics  . Smoking status: Former Smoker -- 0.00 packs/day for 40 years    Types: Cigarettes    Quit date: 08/29/2010  . Smokeless tobacco: Never Used     Comment: "quit smoking cigarettes in ~ 2011"  . Alcohol Use: No     Comment: 08/22/2014 "stopped in ~ 2011"  . Drug Use: No     Comment: 08/22/2014 "stopped in ~ 2011; all types of drugs""  . Sexual Activity: No   Other Topics Concern  . Not on file   Social History Narrative     ROS:  No fevers, chills, syncope, orthopnea, PND, rash, fevers, bleeding All 11 ROS were addressed and are negative except what is stated in the HPI   Physical Exam: Blood pressure 149/79, pulse 56, temperature 97.5 F (36.4 C), temperature source Oral, resp. rate 7, height 5\' 7"  (1.702 m), weight 229 lb 11.2 oz (104.191 kg), SpO2 98 %.   General: Well developed, well nourished, in no acute distress Head: Eyes PERRLA, No xanthomas.   Normal cephalic and atramatic  Lungs:   Clear bilaterally to auscultation and percussion. Normal respiratory effort. No wheezes, no rales. Heart:   HRRR S1 S2 Pulses are 2+ & equal. Soft systolic murmur, rubs, gallops.  No carotid bruit. No JVD.  No abdominal bruits.  Abdomen: Bowel sounds are positive, abdomen soft and non-tender without masses. No hepatosplenomegaly. Msk:  Back normal. Normal strength and tone for age. Extremities:  No clubbing, cyanosis or edema.  DP +1 Neuro: Alert and oriented X 3, non-focal, MAE x 4 GU: Deferred Rectal: Deferred Psych:  Good affect, responds appropriately      Labs: Lab Results  Component Value Date   WBC 6.3 02/25/2015   HGB 12.4* 02/25/2015   HCT 37.4* 02/25/2015   MCV 87.8 02/25/2015   PLT 246 02/25/2015     Recent Labs Lab 02/25/15 0108  NA 141  K 4.0  CL 110  CO2 23  BUN 26*  CREATININE 2.67*  CALCIUM 8.7*  GLUCOSE 110*    Recent Labs  02/25/15 0108 02/25/15 0442 02/25/15 0655  TROPONINI <0.03 <0.03 <0.03   Lab Results  Component Value Date    CHOL 259* 08/23/2014   HDL 33* 08/23/2014   LDLCALC 158* 08/23/2014   TRIG 338* 08/23/2014   Lab Results  Component Value Date   DDIMER 0.91* 02/25/2015     Radiology:  Dg Chest 2 View  02/25/2015  CLINICAL DATA:  Chest pain EXAM: CHEST  2 VIEW COMPARISON:  08/28/2014 FINDINGS: No cardiomegaly when accounting for probable apical fat pad. Stable aortic tortuosity. Low volumes with interstitial crowding/ subsegmental atelectasis at the bases. No edema, effusion, or pneumothorax. No osseous finding to explain acute chest pain. IMPRESSION: Hypoventilation without edema  or suspected pneumonia. Electronically Signed   By: Marnee Spring M.D.   On: 02/25/2015 01:40   Personally viewed.  Echocardiogram: 05/22/14: - Left ventricle: The cavity size was normal. There was mild concentric hypertrophy. There is moderate to severe hypertrophyof the LV apex, consider variant hypertrophic cardiomyopathy. Systolic function was normal. The estimated ejection fraction was in the range of 55% to 60%. Wall motion was normal; there were no regional wall motion abnormalities. Doppler parameters are consistent with abnormal left ventricular relaxation (grade 1 diastolic dysfunction). - Aortic valve: There was trivial regurgitation. - Left atrium: The atrium was mildly dilated. - Pulmonary arteries: Systolic pressure was mildly increased. PA peak pressure: 36 mm Hg (S).  Nuclear stress test 05/24/14-low risk, no ischemia, EF 56%  EKG:  Normal sinus rhythm, fairly diffuse T-wave inversions unchanged from prior EKG, hypertrophic cardio myopathy appearance. Personally viewed.   ASSESSMENT/PLAN:    59 year old Richard Barnett with coronary artery disease prior PCI in [place], history of MI with recurrent chest pain, relieved with single nitroglycerin. Apical hypertrophy noted on echocardiogram.  Angina  - He is not on long-acting nitrate  - Start isosorbide 15 mg once a day. He is concerned about  potential headache. He will also need a prescription for nitroglycerin.  - Continue with metoprolol  - Given the fleeting nature of his discomfort, prompt response with nitroglycerin, no further invasive testing or stress testing needed. Recent nuclear stress test in March was reassuring.  Coronary artery disease with angina  - Prior stents in [place]  - Continue with aggressive secondary prevention  Apical variant hypertrophic cardiomyopathy  - Echocardiogram demonstrates this finding.  - EKG suggestive of hypertrophic cardiomyopathy, deep T-wave inversion noted diffusely  - Continue with beta blocker  - No high-risk symptoms such as syncope or sustained ventricular tachycardia.  - Early family history of myocardial infarction as noted with father and mother however no sudden cardiac death.  Chronic kidney disease stage IV  - Creatinine ranging from 2.3-2.6  - Avoiding ACE inhibitor  - Avoiding NSAIDs  - Consider nephrology follow-up   Essential hypertension  - Multiple drug regimen  - May be driving some hypertrophy of his myocardium.  Insomnia  - He mention this twice to me. Trouble sleeping at night.   D-dimer was positive. VQ scan is being ordered. If low risk, okay to discharge. We will set up with follow-up, Dr. Delton See.  Donato Schultz, MD  02/25/2015  11:10 AM

## 2015-02-26 ENCOUNTER — Telehealth: Payer: Self-pay

## 2015-02-26 LAB — CALCIUM, IONIZED: CALCIUM, IONIZED, SERUM: 4.7 mg/dL (ref 4.5–5.6)

## 2015-02-26 NOTE — Telephone Encounter (Signed)
Received a fax from [facility]. They have received referral and patient will be scheduled in 2 to 3 months. Thanks!~

## 2015-02-27 ENCOUNTER — Telehealth: Payer: Self-pay | Admitting: Family Medicine

## 2015-02-27 NOTE — Telephone Encounter (Signed)
Misty Stanley from [facility] called for a copy of patient's medication list. Please fax to [phone].

## 2015-02-27 NOTE — Telephone Encounter (Signed)
This has been faxed. Thanks!

## 2015-03-07 ENCOUNTER — Ambulatory Visit: Payer: Medicaid Other | Admitting: Family Medicine

## 2015-03-12 ENCOUNTER — Telehealth: Payer: Self-pay | Admitting: Family Medicine

## 2015-03-12 NOTE — Telephone Encounter (Signed)
Left message for patient regarding cancellation of appointment for 03/13/15. Advised patient to call to reschedule. 

## 2015-03-13 ENCOUNTER — Ambulatory Visit: Payer: Medicaid Other | Admitting: Family Medicine

## 2015-03-17 ENCOUNTER — Ambulatory Visit: Payer: Medicaid Other | Admitting: Family Medicine

## 2015-03-19 ENCOUNTER — Ambulatory Visit: Payer: Medicaid Other | Admitting: Family Medicine

## 2015-03-19 ENCOUNTER — Telehealth: Payer: Self-pay

## 2015-03-19 NOTE — Telephone Encounter (Signed)
Received a Call from [facility] today, Patient no showed to appointment on 03/18/2015. Receptionist called to reschedule patient and said patient got upset with her because she wouldn't schedule transportation for him and said to "forget it" he was not going to make a future appointment with their office. I notified PCP Julianne Handler) Thanks!

## 2015-03-20 ENCOUNTER — Telehealth: Payer: Self-pay | Admitting: *Deleted

## 2015-03-20 NOTE — Telephone Encounter (Signed)
Patient verified DOB Patient states he needs an appointment with [facility] for this month. Patient informed of receptionist attempting to reschedule patients appointment and patient becoming upset because the first available appointment is in February. Patient states he needs reminder calls for appointments as well as transportation scheduled. Patient could not find a pen, pencil, crayon, or any other writing utensil to write down [facility] contact number. Patient informed of message being routed to PCP's nurse and patient receiving a follow-up call.

## 2015-03-20 NOTE — Telephone Encounter (Signed)
Called [facility] and left a message for Kennyth Arnold to call back to see if they will be able to reschedule patient. Will Call patient once I have more information. Thanks!

## 2015-03-21 ENCOUNTER — Ambulatory Visit: Payer: Medicaid Other | Attending: Family Medicine | Admitting: Clinical

## 2015-03-21 DIAGNOSIS — Z659 Problem related to unspecified psychosocial circumstances: Secondary | ICD-10-CM

## 2015-03-21 NOTE — Progress Notes (Signed)
ASSESSMENT: Pt continues to experience problems related to psychosocial circumstances, and would benefit from additional community resources and continued supportive counseling regarding coping with psychosocial circumstances.  Stage of Change: precontemplative  PLAN: 1. F/U with behavioral health consultant in as needed 2. Psychiatric Medications: none. 3. Behavioral recommendation(s):   -Accept referral for [facility], they will call to set up first home visit -Answer phone when [facility] calls to set up home visit  SUBJECTIVE: Pt. referred by self for healthcare navigation:  Pt. reports the following symptoms/concerns: Pt states that his [facility]), but that [facility] pharmacy says that meds will be ready to pick up at 5pm at [facility] instead; he wants to know why [facility] has not called him, and why nobody has called him from a doctors office. (When patient is told that Jamie from [facility] and nurse from [facility] called him, he says that sometimes he is busy). Pt states that his daughter is helping him, and has brought him to the clinic today, to pick up his medications.  Duration of problem: Today Severity: mild  OBJECTIVE: Orientation & Cognition: Oriented x3. Thought processes normal and appropriate to situation. Mood: appropriate. Affect: appropriate Appearance: appropriate Risk of harm to self or others: no known risk of harm to self or others Substance use: none Assessments administered: none  Diagnosis: Problems related to psychosocial circumstances CPT Code: Z65.9 -------------------------------------------- Other(s) present in the room: adult daughter  Time spent with patient in exam room: 16 minutes

## 2015-03-21 NOTE — Patient Instructions (Signed)
PLAN:  1. F/U with behavioral health consultant as needed 2. Psychiatric Medications: none 3. Behavioral recommendation(s):  -Accept referral for physicians pharmacy, they will call to set up first home visit  -Answer phone when [facility] calls again to set up first home visit

## 2015-03-21 NOTE — Telephone Encounter (Signed)
Called and spoke with Patient and advised of Appointment set for [facility] for 04/16/2015 @ 10:45am. With Dr Hyman Hopes. Patient states understanding and is aware that he will have to call the Bus to set up transportation. Thanks!

## 2015-03-25 ENCOUNTER — Telehealth: Payer: Self-pay | Admitting: Clinical

## 2015-03-25 NOTE — Telephone Encounter (Signed)
Follow-up with Richard Barnett, who says that someone from [facility] has contacted him, and that they will call him prior to coming for a home visit; he would like to be reminded the day before his next appointment with diabetes education. He is assured that he will receive a reminder call from [facility] at [phone] on Tuesday, January 23, to remind him of his appointment on 04-01-15; he voices understanding and agreement with that plan.

## 2015-03-31 ENCOUNTER — Telehealth: Payer: Self-pay | Admitting: Clinical

## 2015-03-31 ENCOUNTER — Other Ambulatory Visit: Payer: Self-pay | Admitting: Family Medicine

## 2015-03-31 DIAGNOSIS — E785 Hyperlipidemia, unspecified: Secondary | ICD-10-CM

## 2015-03-31 DIAGNOSIS — I10 Essential (primary) hypertension: Secondary | ICD-10-CM

## 2015-03-31 MED ORDER — ATORVASTATIN CALCIUM 40 MG PO TABS: 40.0000 mg | ORAL_TABLET | Freq: Every day | ORAL | Status: DC

## 2015-03-31 MED ORDER — CLONIDINE HCL 0.2 MG PO TABS: 0.2000 mg | ORAL_TABLET | Freq: Two times a day (BID) | ORAL | Status: DC

## 2015-03-31 MED ORDER — METOPROLOL TARTRATE 50 MG PO TABS: 50.0000 mg | ORAL_TABLET | Freq: Two times a day (BID) | ORAL | Status: DC

## 2015-03-31 MED ORDER — AMLODIPINE BESYLATE 10 MG PO TABS: 10.0000 mg | ORAL_TABLET | Freq: Every day | ORAL | Status: DC

## 2015-03-31 MED ORDER — ISOSORBIDE MONONITRATE ER 30 MG PO TB24: 15.0000 mg | ORAL_TABLET | Freq: Every day | ORAL | Status: DC

## 2015-03-31 MED ORDER — ASPIRIN 81 MG PO TBEC: 81.0000 mg | DELAYED_RELEASE_TABLET | Freq: Every day | ORAL | Status: DC

## 2015-03-31 MED ORDER — NITROGLYCERIN 0.4 MG SL SUBL: 0.4000 mg | SUBLINGUAL_TABLET | SUBLINGUAL | Status: DC | PRN

## 2015-03-31 NOTE — Telephone Encounter (Signed)
Please refill all RX with the exception of the aspirin andacetaminophen-codeine (TYLENOL #3) 300-30 MG tablet  . Please fax all requests to [phone] ([facility])

## 2015-03-31 NOTE — Telephone Encounter (Signed)
Medication refilled to pharmacy. Thanks.

## 2015-03-31 NOTE — Telephone Encounter (Signed)
Call to remind [name] that he has an appointment tomorrow, 04-01-15 at [facility] at 11:15am. [name] states that he does not know where that is located, nor how to get there, and is given the address. He is also reminded that [facility] will be coming to his home this morning, and it is recommended that he ask [facility] case manager to pull up picture of the building on their phone, so that he knows what the building looks like when he gets off the bus, and the best bus to take to get to his appointment. [name] expresses understanding, and says he will ask [facility] when they arrive at 9:30am, and agrees to call and/or have [facility] call Richard Barnett at [facility] at [phone], if he has any further questions.

## 2015-04-01 ENCOUNTER — Encounter: Payer: Self-pay | Admitting: Dietician

## 2015-04-01 ENCOUNTER — Emergency Department (HOSPITAL_COMMUNITY)
Admission: EM | Admit: 2015-04-01 | Discharge: 2015-04-01 | Disposition: A | Discharge status: Other Acute Inpatient Hospital | Payer: Medicaid Other | Attending: Emergency Medicine | Admitting: Emergency Medicine

## 2015-04-01 ENCOUNTER — Ambulatory Visit: Payer: Medicaid Other | Admitting: Dietician

## 2015-04-01 ENCOUNTER — Encounter (HOSPITAL_COMMUNITY): Payer: Self-pay

## 2015-04-01 ENCOUNTER — Encounter: Payer: Medicaid Other | Attending: Family Medicine | Admitting: Dietician

## 2015-04-01 ENCOUNTER — Emergency Department (HOSPITAL_COMMUNITY): Payer: Medicaid Other

## 2015-04-01 DIAGNOSIS — Z86718 Personal history of other venous thrombosis and embolism: Secondary | ICD-10-CM | POA: Insufficient documentation

## 2015-04-01 DIAGNOSIS — Z87448 Personal history of other diseases of urinary system: Secondary | ICD-10-CM | POA: Insufficient documentation

## 2015-04-01 DIAGNOSIS — I252 Old myocardial infarction: Secondary | ICD-10-CM | POA: Insufficient documentation

## 2015-04-01 DIAGNOSIS — I129 Hypertensive chronic kidney disease with stage 1 through stage 4 chronic kidney disease, or unspecified chronic kidney disease: Secondary | ICD-10-CM | POA: Insufficient documentation

## 2015-04-01 DIAGNOSIS — Z87891 Personal history of nicotine dependence: Secondary | ICD-10-CM | POA: Diagnosis not present

## 2015-04-01 DIAGNOSIS — Z880 Allergy status to penicillin: Secondary | ICD-10-CM | POA: Diagnosis not present

## 2015-04-01 DIAGNOSIS — R45851 Suicidal ideations: Secondary | ICD-10-CM | POA: Insufficient documentation

## 2015-04-01 DIAGNOSIS — E7800 Pure hypercholesterolemia, unspecified: Secondary | ICD-10-CM | POA: Insufficient documentation

## 2015-04-01 DIAGNOSIS — R011 Cardiac murmur, unspecified: Secondary | ICD-10-CM | POA: Insufficient documentation

## 2015-04-01 DIAGNOSIS — I509 Heart failure, unspecified: Secondary | ICD-10-CM | POA: Insufficient documentation

## 2015-04-01 DIAGNOSIS — F329 Major depressive disorder, single episode, unspecified: Secondary | ICD-10-CM | POA: Insufficient documentation

## 2015-04-01 DIAGNOSIS — Z79899 Other long term (current) drug therapy: Secondary | ICD-10-CM | POA: Insufficient documentation

## 2015-04-01 DIAGNOSIS — Z046 Encounter for general psychiatric examination, requested by authority: Secondary | ICD-10-CM | POA: Diagnosis present

## 2015-04-01 DIAGNOSIS — N189 Chronic kidney disease, unspecified: Secondary | ICD-10-CM | POA: Insufficient documentation

## 2015-04-01 DIAGNOSIS — I209 Angina pectoris, unspecified: Secondary | ICD-10-CM | POA: Diagnosis not present

## 2015-04-01 DIAGNOSIS — F419 Anxiety disorder, unspecified: Secondary | ICD-10-CM | POA: Diagnosis not present

## 2015-04-01 DIAGNOSIS — E119 Type 2 diabetes mellitus without complications: Secondary | ICD-10-CM | POA: Insufficient documentation

## 2015-04-01 DIAGNOSIS — M1990 Unspecified osteoarthritis, unspecified site: Secondary | ICD-10-CM | POA: Insufficient documentation

## 2015-04-01 DIAGNOSIS — Z9861 Coronary angioplasty status: Secondary | ICD-10-CM | POA: Insufficient documentation

## 2015-04-01 DIAGNOSIS — Z7982 Long term (current) use of aspirin: Secondary | ICD-10-CM | POA: Diagnosis not present

## 2015-04-01 LAB — RAPID URINE DRUG SCREEN, HOSP PERFORMED
Amphetamines: NOT DETECTED
BENZODIAZEPINES: NOT DETECTED
Barbiturates: NOT DETECTED
Cocaine: NOT DETECTED
Opiates: NOT DETECTED
Tetrahydrocannabinol: NOT DETECTED

## 2015-04-01 LAB — CBC
HCT: 43.8 % (ref 39.0–52.0)
Hemoglobin: 14.2 g/dL (ref 13.0–17.0)
MCH: 29.3 pg (ref 26.0–34.0)
MCHC: 32.4 g/dL (ref 30.0–36.0)
MCV: 90.3 fL (ref 78.0–100.0)
PLATELETS: 275 10*3/uL (ref 150–400)
RBC: 4.85 MIL/uL (ref 4.22–5.81)
RDW: 13.7 % (ref 11.5–15.5)
WBC: 5.5 10*3/uL (ref 4.0–10.5)

## 2015-04-01 LAB — COMPREHENSIVE METABOLIC PANEL
ALK PHOS: 62 U/L (ref 38–126)
ALT: 21 U/L (ref 17–63)
AST: 28 U/L (ref 15–41)
Albumin: 4.2 g/dL (ref 3.5–5.0)
Anion gap: 12 (ref 5–15)
BUN: 18 mg/dL (ref 6–20)
CALCIUM: 9.4 mg/dL (ref 8.9–10.3)
CO2: 24 mmol/L (ref 22–32)
CREATININE: 2.64 mg/dL — AB (ref 0.61–1.24)
Chloride: 107 mmol/L (ref 101–111)
GFR calc Af Amer: 29 mL/min — ABNORMAL LOW (ref >60)
GFR, EST NON AFRICAN AMERICAN: 25 mL/min — AB (ref >60)
Glucose, Bld: 90 mg/dL (ref 65–99)
Potassium: 3.9 mmol/L (ref 3.5–5.1)
SODIUM: 143 mmol/L (ref 135–145)
TOTAL PROTEIN: 7.6 g/dL (ref 6.5–8.1)
Total Bilirubin: 0.6 mg/dL (ref 0.3–1.2)

## 2015-04-01 LAB — URINALYSIS, ROUTINE W REFLEX MICROSCOPIC
Glucose, UA: NEGATIVE mg/dL
Ketones, ur: NEGATIVE mg/dL
Leukocytes, UA: NEGATIVE
Nitrite: NEGATIVE
SPECIFIC GRAVITY, URINE: 1.026 (ref 1.005–1.030)
pH: 5 (ref 5.0–8.0)

## 2015-04-01 LAB — URINE MICROSCOPIC-ADD ON

## 2015-04-01 NOTE — Progress Notes (Signed)
  Medical Nutrition Therapy:  Appt start time: 1100 end time:  1130.   Assessment:  Primary concerns today: Patient is here today alone.  The referral was for prediabetes.  His HgbA1C has been noted to be between 6.3% and 6.5% for the last 7 months.  Hx also includes CKD with GFR of 28 02/25/15.  Patient shared that his mother had diabetes and dialysis.  215 lbs today  Patient arrived to his appointment very early at 9:37.  He cancelled yesterday and was encouraged by the Secretary to come today.  I completed the depression screen (Patient scored a 23) and I asked him about his health especially regarding his kidneys.  Patient stated, "Sometimes I feel like I want to give up.  In fact, I was thinking about doing something this afternoon."  Based on suicide ideation, 911 was called.  Discussed with patient need to care for himself and get help that he needed.  He reported that he does not have enough food often, eats one meal per day, and receives disability.  He has not taken his medications in the past two days as he cannot afford them.  He stated that he cannot read.  Police office [employer] Constant took him for an evaluation at [facility].  After the event, his chart was further reviewed and noted that he has had SI in the past and has been followed by a Child psychotherapist at his primary care office.  Preferred Learning Style:   No preference indicated   Learning Readiness:   Not ready  MEDICATIONS: see list but has not taken them in 2 days as he ran out   DIETARY INTAKE:  Usual eating pattern includes 1 meal per day as he cannot afford more  24-hr recall:  B ( AM):   Snk ( AM):   L ( PM):  Snk ( PM):  D ( PM):  Snk ( PM):  Beverages: regular soda including dark soda  Usual physical activity:   Estimated energy needs: 1800 calories 200 g carbohydrates 135 g protein 50 g fat  Progress Towards Goal(s):  In progress.   Nutritional Diagnosis:  NB-3.2 Limited access to food or water As  related to  income.  As evidenced by patient report of eating one time per day due to inability to afford and often being hungry.. NB-1.1 Food and nutrition-related knowledge deficit As related to prediabetes and CKD.  As evidenced by patient report.    Intervention:  Basic Nutrition counseling/education started.  Discussed need to avoid dark and regular soda.  Discussed need to care for self.  Gave resources for food banks and feeding sites locally.  Patient cannot read and question support.  Due to SI patient was taken to [facility] for further evaluation.  Patient can follow up as needed for education.  Handouts given during visit include:  Food insecurity handout  Barriers to learning/adherence to lifestyle change: depression and SI  Demonstrated degree of understanding via:  Teach Back   Monitoring/Evaluation:  Dietary intake, exercise, and body weight prn.

## 2015-04-01 NOTE — Discharge Instructions (Signed)
Stress and Stress Management °Stress is a normal reaction to life events. It is what you feel when life demands more than you are used to or more than you can handle. Some stress can be useful. For example, the stress reaction can help you catch the last bus of the day, study for a test, or meet a deadline at work. But stress that occurs too often or for too long can cause problems. It can affect your emotional health and interfere with relationships and normal daily activities. Too much stress can weaken your immune system and increase your risk for physical illness. If you already have a medical problem, stress can make it worse. °CAUSES  °All sorts of life events may cause stress. An event that causes stress for one person may not be stressful for another person. Major life events commonly cause stress. These may be positive or negative. Examples include losing your job, moving into a new home, getting married, having a baby, or losing a loved one. Less obvious life events may also cause stress, especially if they occur day after day or in combination. Examples include working long hours, driving in traffic, caring for children, being in debt, or being in a difficult relationship. °SIGNS AND SYMPTOMS °Stress may cause emotional symptoms including, the following: °· Anxiety. This is feeling worried, afraid, on edge, overwhelmed, or out of control. °· Anger. This is feeling irritated or impatient. °· Depression. This is feeling sad, down, helpless, or guilty. °· Difficulty focusing, remembering, or making decisions. °Stress may cause physical symptoms, including the following:  °· Aches and pains. These may affect your head, neck, back, stomach, or other areas of your body. °· Tight muscles or clenched jaw. °· Low energy or trouble sleeping.  °Stress may cause unhealthy behaviors, including the following:  °· Eating to feel better (overeating) or skipping meals. °· Sleeping too little, too much, or both. °· Working  too much or putting off tasks (procrastination). °· Smoking, drinking alcohol, or using drugs to feel better. °DIAGNOSIS  °Stress is diagnosed through an assessment by your health care provider. Your health care provider will ask questions about your symptoms and any stressful life events. Your health care provider will also ask about your medical history and may order blood tests or other tests. Certain medical conditions and medicine can cause physical symptoms similar to stress.  Mental illness can cause emotional symptoms and unhealthy behaviors similar to stress. Your health care provider may refer you to a mental health professional for further evaluation.  °TREATMENT  °Stress management is the recommended treatment for stress. The goals of stress management are reducing stressful life events and coping with stress in healthy ways.  °Techniques for reducing stressful life events include the following: °· Stress identification. Self-monitor for stress and identify what causes stress for you. These skills may help you to avoid some stressful events. °· Time management. Set your priorities, keep a calendar of events, and learn to say "no." These tools can help you avoid making too many commitments. °Techniques for coping with stress include the following: °· Rethinking the problem. Try to think realistically about stressful events rather than ignoring them or overreacting. Try to find the positives in a stressful situation rather than focusing on the negatives. °· Exercise. Physical exercise can release both physical and emotional tension. The key is to find a form of exercise you enjoy and do it regularly. °· Relaxation techniques. These relax the body and mind. Examples include yoga, meditation, tai chi, biofeedback, deep   breathing, progressive muscle relaxation, listening to music, being out in nature, journaling, and other hobbies. Again, the key is to find one or more that you enjoy and can do  regularly.  Healthy lifestyle. Eat a balanced diet, get plenty of sleep, and do not smoke. Avoid using alcohol or drugs to relax.  Strong support network. Spend time with family, friends, or other people you enjoy being around.Express your feelings and talk things over with someone you trust. Counseling or talktherapy with a mental health professional may be helpful if you are having difficulty managing stress on your own. Medicine is typically not recommended for the treatment of stress.Talk to your health care provider if you think you need medicine for symptoms of stress. HOME CARE INSTRUCTIONS  Keep all follow-up visits as directed by your health care provider.  Take all medicines as directed by your health care provider. SEEK MEDICAL CARE IF:  Your symptoms get worse or you start having new symptoms.  You feel overwhelmed by your problems and can no longer manage them on your own. SEEK IMMEDIATE MEDICAL CARE IF:  You feel like hurting yourself or someone else.   This information is not intended to replace advice given to you by your health care provider. Make sure you discuss any questions you have with your health care provider.   Document Released: 08/18/2000 Document Revised: 03/15/2014 Document Reviewed: 10/17/2012 Elsevier Interactive Patient Education [employer].

## 2015-04-01 NOTE — ED Notes (Addendum)
Patient brought in by [facility] from [facility] for medical clearance. Patient is to return once medically cleared. Patient is SI and states a plan to "cut himself up."  Patient stated that he wants to kill himself due to his medical problems. Patient has IVC papers.

## 2015-04-01 NOTE — ED Provider Notes (Signed)
CSN: [id number]     Arrival date & time 04/01/15  1439 History   First MD Initiated Contact with Patient 04/01/15 1506     Chief Complaint  Patient presents with  . Medical Clearance     (Consider location/radiation/quality/duration/timing/severity/associated sxs/prior Treatment) Patient is a 60 y.o. Aladdin presenting with mental health disorder. The history is provided by the patient.  Mental Health Problem Presenting symptoms: suicidal thoughts and suicidal threats   Patient accompanied by:  Law enforcement Degree of incapacity (severity):  Moderate Onset quality:  Gradual Timing:  Constant Progression:  Unchanged Chronicity:  New Treatment compliance:  Untreated Relieved by:  Nothing Worsened by:  Nothing tried Ineffective treatments:  None tried Associated symptoms: anxiety, irritability and poor judgment     Past Medical History  Diagnosis Date  . Hypertension   . Hypercholesterolemia   . Heart murmur   . CHF (congestive heart failure) (HCC)   . Anginal pain (HCC)   . MI (myocardial infarction) (HCC)     "I've had couple when I was asleep; a couple when I was awake" (08/22/2014)  . DVT (deep venous thrombosis) (HCC) ?    RLE  . Daily headache   . Depression     "I always get that" (08/22/2014)  . Chronic kidney disease     "from my blood pressure problems" (08/22/2014)  . Chronic renal insufficiency     [name] 08/22/2014  . Arthritis     "right leg" (08/22/2014)  . Diabetes mellitus without complication [facility])    Past Surgical History  Procedure Laterality Date  . Coronary angioplasty with stent placement      "1 + 1"   Family History  Problem Relation Age of Onset  . Hypertension Mother   . Kidney disease Mother   . Heart disease Mother   . Heart disease Father   . Hypertension Father    Social History  Substance Use Topics  . Smoking status: Former Smoker -- 0.00 packs/day for 40 years    Types: Cigarettes    Quit date: 08/29/2010  . Smokeless tobacco:  Never Used     Comment: "quit smoking cigarettes in ~ 2011"  . Alcohol Use: No     Comment: 08/22/2014 "stopped in ~ 2011"    Review of Systems  Constitutional: Positive for irritability.  Psychiatric/Behavioral: Positive for suicidal ideas. The patient is nervous/anxious.   All other systems reviewed and are negative.     Allergies  Penicillins  Home Medications   Prior to Admission medications   Medication Sig Start Date End Date Taking? Authorizing Provider  acetaminophen-codeine (TYLENOL #3) 300-30 MG tablet Take 1 tablet by mouth every 6 (six) hours as needed for moderate pain. 02/11/15  Yes Massie Maroon, FNP  amLODipine (NORVASC) 10 MG tablet Take 1 tablet (10 mg total) by mouth daily. 03/31/15  Yes Massie Maroon, FNP  aspirin 81 MG EC tablet Take 1 tablet (81 mg total) by mouth daily. 03/31/15  Yes Massie Maroon, FNP  atorvastatin (LIPITOR) 40 MG tablet Take 1 tablet (40 mg total) by mouth daily at 6 PM. 03/31/15  Yes Massie Maroon, FNP  cloNIDine (CATAPRES) 0.2 MG tablet Take 1 tablet (0.2 mg total) by mouth 2 (two) times daily. 03/31/15  Yes Massie Maroon, FNP  isosorbide mononitrate (IMDUR) 30 MG 24 hr tablet Take 0.5 tablets (15 mg total) by mouth daily. 03/31/15  Yes Massie Maroon, FNP  metoprolol (LOPRESSOR) 50 MG tablet Take 1 tablet (  50 mg total) by mouth 2 (two) times daily. 03/31/15  Yes Massie Maroon, FNP  nitroGLYCERIN (NITROSTAT) 0.4 MG SL tablet Place 1 tablet (0.4 mg total) under the tongue every 5 (five) minutes as needed for chest pain. 03/31/15  Yes Massie Maroon, FNP   BP 149/105 mmHg  Pulse 66  Temp(Src) 98.6 F (37 C) (Oral)  Resp 16  SpO2 100% Physical Exam  Constitutional: He is oriented to person, place, and time. He appears well-developed and well-nourished. No distress.  HENT:  Head: Normocephalic and atraumatic.  Eyes: Conjunctivae are normal.  Neck: Neck supple. No tracheal deviation present.  Cardiovascular: Normal rate,  regular rhythm and normal heart sounds.   Pulmonary/Chest: Effort normal and breath sounds normal. No respiratory distress.  Abdominal: Soft. He exhibits no distension.  Neurological: He is alert and oriented to person, place, and time.  Skin: Skin is warm and dry.  Psychiatric: He has a normal mood and affect. He expresses suicidal ideation. He expresses suicidal plans.  Vitals reviewed.   ED Course  Procedures (including critical care time) Labs Review Labs Reviewed  COMPREHENSIVE METABOLIC PANEL - Abnormal; Notable for the following:    Creatinine, Ser 2.64 (*)    GFR calc non Af Amer 25 (*)    GFR calc Af Amer 29 (*)    All other components within normal limits  CBC  URINE RAPID DRUG SCREEN, HOSP PERFORMED  URINALYSIS, ROUTINE W REFLEX MICROSCOPIC (NOT AT [facility])    Imaging Review Dg Chest 2 View  04/01/2015  CLINICAL DATA:  Medical clearance. EXAM: CHEST  2 VIEW COMPARISON:  02/25/2015. FINDINGS: Normal sized heart. Tortuous aorta. Clear lungs. Mild scoliosis and minimal thoracic spine degenerative changes. Cholecystectomy clips. IMPRESSION: No acute abnormality. Electronically Signed   By: Beckie Salts M.D.   On: 04/01/2015 15:36   I have personally reviewed and evaluated these images and lab results as part of my medical decision-making.   EKG Interpretation   Date/Time:  Tuesday April 01 2015 15:09:15 EST Ventricular Rate:  55 PR Interval:  134 QRS Duration: 91 QT Interval:  435 QTC Calculation: 416 R Axis:   46 Text Interpretation:  Sinus rhythm Probable anteroseptal infarct, old  Nonspecific T abnormalities, inferior leads Minimal ST elevation, inferior  leads No significant change since last tracing Confirmed by Zamire Whitehurst MD,  Keymarion Bearman ([id number]) on 04/01/2015 3:25:22 PM      MDM   Final diagnoses:  Suicidal ideation   Pt here for medical clearance for SI admit to [facility] under IVC. Endorses active ideation to cut himself. No significant infectious, hematologic or  metabolic abnormalities to explain symptoms. Creatinine is at baseline. MEDICALLY CLEAR FOR TRANSFER OR PSYCHIATRIC ADMISSION. Pt to proceed to [facility] in police custody.    Lyndal Pulley, MD 04/01/15 [phone]

## 2015-04-01 NOTE — ED Notes (Signed)
Lab results faxed to [facility]. Called [facility] and they received. Charge nurse Vruricue stated that the patient could come back. Patient was medically cleared by EDP.

## 2015-04-17 ENCOUNTER — Encounter: Payer: Self-pay | Admitting: Family Medicine

## 2015-04-17 ENCOUNTER — Ambulatory Visit (INDEPENDENT_AMBULATORY_CARE_PROVIDER_SITE_OTHER): Payer: Medicaid Other | Admitting: Family Medicine

## 2015-04-17 VITALS — BP 152/90 | HR 59 | Temp 98.0°F | Resp 16 | Ht 67.0 in | Wt 221.0 lb

## 2015-04-17 DIAGNOSIS — F32A Depression, unspecified: Secondary | ICD-10-CM

## 2015-04-17 DIAGNOSIS — E785 Hyperlipidemia, unspecified: Secondary | ICD-10-CM

## 2015-04-17 DIAGNOSIS — F329 Major depressive disorder, single episode, unspecified: Secondary | ICD-10-CM

## 2015-04-17 DIAGNOSIS — N183 Chronic kidney disease, stage 3 unspecified: Secondary | ICD-10-CM

## 2015-04-17 DIAGNOSIS — R7303 Prediabetes: Secondary | ICD-10-CM

## 2015-04-17 DIAGNOSIS — I10 Essential (primary) hypertension: Secondary | ICD-10-CM

## 2015-04-17 DIAGNOSIS — B351 Tinea unguium: Secondary | ICD-10-CM

## 2015-04-17 NOTE — Progress Notes (Signed)
Subjective:    Patient ID: Richard Barnett, [name]    DOB: Oct 25, 1955, 60 y.o.   MRN: [id number] [name], a 60 year old Dak[place] with a history of hypertension presents accompanied by Ms. Timmons, [facility] representative  for a follow-up of hypertension and chronic CKD stage 3. Patient recently started taking antihypertensive medications consistently. He has been receiving pre-packaged pill box from pharmacy to assist with adherence to medication regimen over the past week. Patient maintains that his daughter has been providing assistance with meal preparation to ensure that he eats healthier. He states that he has not been exercising, but remains active. Patient was evaluated by Dr. Hyman Hopes, nephrologist on 04/16/2015 for chronic kidney disease stage 3. He currently denies headache, dizziness, lightheadnessed, chest pain, heart palpitations, shortness of breath, dysuria, back pain, nausea, vomiting, or diarrhea.  Patient is also complaining of toenail thickening and discoloration. He states that toenails have been thickening over the past several year. He also reports that toe nails are long and it has become painful to wear shoes.  Hypertension Pertinent negatives include no headaches.  Toe Pain  There was no injury mechanism. The pain is present in the left toes and right toes. The quality of the pain is described as aching. The pain is at a severity of 0/10. The patient is experiencing no pain (No pain at present). Exacerbated by: Wearing shoes. He has tried nothing for the symptoms.     Past Medical History  Diagnosis Date  . Hypertension   . Hypercholesterolemia   . Heart murmur   . CHF (congestive heart failure) (HCC)   . Anginal pain (HCC)   . MI (myocardial infarction) (HCC)     "I've had couple when I was asleep; a couple when I was awake" (08/22/2014)  . DVT (deep venous thrombosis) (HCC) ?    RLE  . Daily headache   . Depression     "I always get that" (08/22/2014)  . Chronic kidney  disease     "from my blood pressure problems" (08/22/2014)  . Chronic renal insufficiency     Richard Barnett 08/22/2014  . Arthritis     "right leg" (08/22/2014)  . Diabetes mellitus without complication (HCC)   There is no immunization history for the selected administration types on file for this patient.  Social History   Social History  . Marital Status: Divorced    Spouse Name: N/A  . Number of Children: N/A  . Years of Education: N/A   Occupational History  . Not on file.   Social History Main Topics  . Smoking status: Former Smoker -- 0.00 packs/day for 40 years    Types: Cigarettes    Quit date: 08/29/2010  . Smokeless tobacco: Never Used     Comment: "quit smoking cigarettes in ~ 2011"  . Alcohol Use: No     Comment: 08/22/2014 "stopped in ~ 2011"  . Drug Use: No     Comment: 08/22/2014 "stopped in ~ 2011; all types of drugs""  . Sexual Activity: No   Other Topics Concern  . Not on file   Social History Narrative   Allergies  Allergen Reactions  . Penicillins Swelling and Rash    Swelling of whole body  Has patient had a PCN reaction causing immediate rash, facial/tongue/throat swelling, SOB or lightheadedness with hypotension: No Has patient had a PCN reaction causing severe rash involving mucus membranes or skin necrosis: No Has patient had a PCN reaction that required hospitalization No Has  patient had a PCN reaction occurring within the last 10 years: No If all of the above answers are "NO", then may proceed with Cephalosporin use.    Review of Systems  Constitutional: Negative for fever, diaphoresis, fatigue and unexpected weight change.  Eyes: Negative.  Negative for visual disturbance.  Respiratory: Negative.  Negative for chest tightness.   Cardiovascular: Negative.   Gastrointestinal: Negative for nausea, vomiting, diarrhea, constipation, blood in stool, abdominal distention, anal bleeding and rectal pain.  Endocrine: Negative for polydipsia, polyphagia and  polyuria.  Genitourinary: Negative.   Musculoskeletal: Negative.  Negative for back pain.  Skin: Negative.        Toe nail thickening  Allergic/Immunologic: Negative for immunocompromised state.  Neurological: Negative.  Negative for dizziness, tremors, syncope and headaches.  Hematological: Negative.   Psychiatric/Behavioral: Negative.  Negative for suicidal ideas, behavioral problems, confusion, sleep disturbance and decreased concentration.       Objective:   Physical Exam  Constitutional: He is oriented to person, place, and time. He appears well-developed and well-nourished.  HENT:  Head: Normocephalic and atraumatic.  Right Ear: External ear normal.  Left Ear: External ear normal.  Nose: Nose normal.  Mouth/Throat: Oropharynx is clear and moist.  Eyes: Conjunctivae and EOM are normal. Pupils are equal, round, and reactive to light.  Neck: Normal range of motion. Neck supple.  Cardiovascular: Normal rate, regular rhythm, normal heart sounds and intact distal pulses.   Pulmonary/Chest: Effort normal and breath sounds normal.  Abdominal: Soft. Bowel sounds are normal. There is no tenderness.  Increased abdominal girth  Musculoskeletal: Normal range of motion.  Neurological: He is alert and oriented to person, place, and time. He has normal reflexes.  Skin: Skin is warm and dry.  Toe nail hyperpigmentation, thickening, and long  Psychiatric: He has a normal mood and affect. His behavior is normal. Judgment and thought content normal.     BP 152/90 mmHg  Pulse 59  Temp(Src) 98 F (36.7 C) (Oral)  Resp 16  Ht 5\' 7"  (1.702 m)  Wt 221 lb (100.245 kg)  BMI 34.61 kg/m2 Assessment & Plan:   1. Essential hypertension Blood pressure is above goal on current medication. He has been taking medications consistently for the past week. Will have Mr. Ridley follow up in 1 month for hypertension. Patient had labs drawn at nephrology f/u on yesterday. Will review notes.  Recommend a  lowfat, low carbohydrate diet divided over 5-6 small meals, increase water intake to 6-8 glasses, and 150 minutes per week of cardiovascular exercise.  - Ambulatory referral to Ophthalmology 2. Chronic kidney disease, stage 3 (moderate) Previously referred to nephrology. Mr. Billy was evaluated by Dr. Hyman Hopes, nephrology on 04/16/2015. Will review notes as they become available. Reviewed urinalysis, proteinuria present. Also, creatinine is 2.37 and GFR is 33.    3. Hyperlipidemia Will continue statin therapy. Will check fasting lipid panel in 3 months. The patient is asked to make an attempt to improve diet and exercise patterns to aid in medical management of this problem..   4. Prediabetes Daughter is continuing to manage meals. He was also sent to diabetes & nutrition. He was evaluated on 04/01/2015. Patient was experiencing suicidal ideations at that time and sent directly to the emergency department. He is scheduled to f/u.   5. Onychomycosis Reminded patient to refrain from cutting toenails. Due to thickness and discoloration, patient warrants referral to podiatry.   - Ambulatory referral to Podiatry  6. Depression Mr. Boll was recently started on  Celexa for depression. He currently denies suicidal or homicidal intent   RTC: 1 month for blood pressure check   Nathanael Krist M, FNP  The patient was given clear instructions to go to ER or return to medical center if symptoms do not improve, worsen or new problems develop. The patient verbalized understanding. Will notify patient with laboratory results.

## 2015-04-17 NOTE — Patient Instructions (Addendum)
Will continue medications as previously prescribed Will follow up for hypertension in 1 month Will send a referral for podiatry Sleep hygiene:  Establish a sleep routine Set bedtime (example: 10 pm) Refrain from watching television in bed.  Do not eat within an hour of lying down DASH Eating Plan DASH stands for "Dietary Approaches to Stop Hypertension." The DASH eating plan is a healthy eating plan that has been shown to reduce high blood pressure (hypertension). Additional health benefits may include reducing the risk of type 2 diabetes mellitus, heart disease, and stroke. The DASH eating plan may also help with weight loss. WHAT DO I NEED TO KNOW ABOUT THE DASH EATING PLAN? For the DASH eating plan, you will follow these general guidelines:  Choose foods with a percent daily value for sodium of less than 5% (as listed on the food label).  Use salt-free seasonings or herbs instead of table salt or sea salt.  Check with your health care provider or pharmacist before using salt substitutes.  Eat lower-sodium products, often labeled as "lower sodium" or "no salt added."  Eat fresh foods.  Eat more vegetables, fruits, and low-fat dairy products.  Choose whole grains. Look for the word "whole" as the first word in the ingredient list.  Choose fish and skinless chicken or [place] more often than red meat. Limit fish, poultry, and meat to 6 oz (170 g) each day.  Limit sweets, desserts, sugars, and sugary drinks.  Choose heart-healthy fats.  Limit cheese to 1 oz (28 g) per day.  Eat more home-cooked food and less restaurant, buffet, and fast food.  Limit fried foods.  Cook foods using methods other than frying.  Limit canned vegetables. If you do use them, rinse them well to decrease the sodium.  When eating at a restaurant, ask that your food be prepared with less salt, or no salt if possible. WHAT FOODS CAN I EAT? Seek help from a dietitian for individual calorie  needs. Grains Whole grain or whole wheat bread. Brown rice. Whole grain or whole wheat pasta. Quinoa, bulgur, and whole grain cereals. Low-sodium cereals. Corn or whole wheat flour tortillas. Whole grain cornbread. Whole grain crackers. Low-sodium crackers. Vegetables Fresh or frozen vegetables (raw, steamed, roasted, or grilled). Low-sodium or reduced-sodium tomato and vegetable juices. Low-sodium or reduced-sodium tomato sauce and paste. Low-sodium or reduced-sodium canned vegetables.  Fruits All fresh, canned (in natural juice), or frozen fruits. Meat and Other Protein Products Ground beef (85% or leaner), grass-fed beef, or beef trimmed of fat. Skinless chicken or [place]. Ground chicken or [place]. Pork trimmed of fat. All fish and seafood. Eggs. Dried beans, peas, or lentils. Unsalted nuts and seeds. Unsalted canned beans. Dairy Low-fat dairy products, such as skim or 1% milk, 2% or reduced-fat cheeses, low-fat ricotta or cottage cheese, or plain low-fat yogurt. Low-sodium or reduced-sodium cheeses. Fats and Oils Tub margarines without trans fats. Light or reduced-fat mayonnaise and salad dressings (reduced sodium). Avocado. Safflower, olive, or canola oils. Natural peanut or almond butter. Other Unsalted popcorn and pretzels. The items listed above may not be a complete list of recommended foods or beverages. Contact your dietitian for more options. WHAT FOODS ARE NOT RECOMMENDED? Grains White bread. White pasta. White rice. Refined cornbread. Bagels and croissants. Crackers that contain trans fat. Vegetables Creamed or fried vegetables. Vegetables in a cheese sauce. Regular canned vegetables. Regular canned tomato sauce and paste. Regular tomato and vegetable juices. Fruits Dried fruits. Canned fruit in light or heavy syrup. Fruit juice.  Meat and Other Protein Products Fatty cuts of meat. Ribs, chicken wings, bacon, sausage, bologna, salami, chitterlings, fatback, hot dogs, bratwurst,  and packaged luncheon meats. Salted nuts and seeds. Canned beans with salt. Dairy Whole or 2% milk, cream, half-and-half, and cream cheese. Whole-fat or sweetened yogurt. Full-fat cheeses or blue cheese. Nondairy creamers and whipped toppings. Processed cheese, cheese spreads, or cheese curds. Condiments Onion and garlic salt, seasoned salt, table salt, and sea salt. Canned and packaged gravies. Worcestershire sauce. Tartar sauce. Barbecue sauce. Teriyaki sauce. Soy sauce, including reduced sodium. Steak sauce. Fish sauce. Oyster sauce. Cocktail sauce. Horseradish. Ketchup and mustard. Meat flavorings and tenderizers. Bouillon cubes. Hot sauce. Tabasco sauce. Marinades. Taco seasonings. Relishes. Fats and Oils Butter, stick margarine, lard, shortening, ghee, and bacon fat. Coconut, palm kernel, or palm oils. Regular salad dressings. Other Pickles and olives. Salted popcorn and pretzels. The items listed above may not be a complete list of foods and beverages to avoid. Contact your dietitian for more information. WHERE CAN I FIND MORE INFORMATION? National Heart, Lung, and Blood Institute: travelstabloid.com   This information is not intended to replace advice given to you by your health care provider. Make sure you discuss any questions you have with your health care provider.   Document Released: 02/11/2011 Document Revised: 03/15/2014 Document Reviewed: 12/27/2012 Elsevier Interactive Patient Education [employer].

## 2015-04-18 ENCOUNTER — Other Ambulatory Visit: Payer: Self-pay | Admitting: Nephrology

## 2015-04-18 DIAGNOSIS — N184 Chronic kidney disease, stage 4 (severe): Secondary | ICD-10-CM

## 2015-04-25 ENCOUNTER — Other Ambulatory Visit: Payer: Self-pay

## 2015-04-28 ENCOUNTER — Ambulatory Visit: Payer: Medicaid Other | Admitting: Dietician

## 2015-05-14 ENCOUNTER — Inpatient Hospital Stay: Admission: RE | Admit: 2015-05-14 | Payer: Self-pay | Source: Ambulatory Visit

## 2015-05-19 ENCOUNTER — Encounter: Payer: Self-pay | Admitting: Sports Medicine

## 2015-05-19 ENCOUNTER — Ambulatory Visit (INDEPENDENT_AMBULATORY_CARE_PROVIDER_SITE_OTHER): Payer: Medicaid Other | Admitting: Sports Medicine

## 2015-05-19 DIAGNOSIS — B351 Tinea unguium: Secondary | ICD-10-CM | POA: Diagnosis not present

## 2015-05-19 DIAGNOSIS — B353 Tinea pedis: Secondary | ICD-10-CM

## 2015-05-19 DIAGNOSIS — M79676 Pain in unspecified toe(s): Secondary | ICD-10-CM

## 2015-05-19 DIAGNOSIS — L853 Xerosis cutis: Secondary | ICD-10-CM

## 2015-05-19 DIAGNOSIS — M79671 Pain in right foot: Secondary | ICD-10-CM

## 2015-05-19 DIAGNOSIS — M79672 Pain in left foot: Secondary | ICD-10-CM

## 2015-05-19 MED ORDER — TERBINAFINE HCL 1 % EX CREA: 1.0000 "application " | TOPICAL_CREAM | Freq: Two times a day (BID) | CUTANEOUS | Status: DC

## 2015-05-19 NOTE — Progress Notes (Signed)
Patient ID: Richard Barnett, Richard Barnett   DOB: 11/15/1955, 60 y.o.   MRN: [id number] Subjective: Richard Barnett is a 60 y.o. Richard Barnett patient seen today in office with complaint of painful thickened and elongated toenails; unable to trim. Patient denies history of Diabetes, Neuropathy, or Vascular disease. Patient has no other pedal complaints at this time.   Patient Active Problem List   Diagnosis Date Noted  . Prediabetes 02/22/2015  . Lymphocytosis 02/14/2015  . Encephalopathy, hypertensive 08/23/2014  . CKD (chronic kidney disease), stage III 08/23/2014  . Pain in the chest   . Hyperlipidemia 05/22/2014  . Chest pain 05/21/2014  . Hypokalemia 05/21/2014  . Chronic kidney disease 05/21/2014  . CAD (coronary artery disease) 05/21/2014  . Hypertension 05/21/2014  . Hypertensive urgency     Current Outpatient Prescriptions on File Prior to Visit  Medication Sig Dispense Refill  . acetaminophen-codeine (TYLENOL #3) 300-30 MG tablet Take 1 tablet by mouth every 6 (six) hours as needed for moderate pain. (Patient not taking: Reported on 04/17/2015) 30 tablet 0  . amLODipine (NORVASC) 10 MG tablet Take 1 tablet (10 mg total) by mouth daily. 30 tablet 3  . aspirin 81 MG EC tablet Take 1 tablet (81 mg total) by mouth daily. 30 tablet 3  . atorvastatin (LIPITOR) 40 MG tablet Take 1 tablet (40 mg total) by mouth daily at 6 PM. 30 tablet 3  . citalopram (CELEXA) 40 MG tablet Take 40 mg by mouth daily.    . cloNIDine (CATAPRES) 0.2 MG tablet Take 1 tablet (0.2 mg total) by mouth 2 (two) times daily. 60 tablet 3  . isosorbide mononitrate (IMDUR) 30 MG 24 hr tablet Take 0.5 tablets (15 mg total) by mouth daily. 30 tablet 3  . metoprolol (LOPRESSOR) 50 MG tablet Take 1 tablet (50 mg total) by mouth 2 (two) times daily. 60 tablet 3  . nitroGLYCERIN (NITROSTAT) 0.4 MG SL tablet Place 1 tablet (0.4 mg total) under the tongue every 5 (five) minutes as needed for chest pain. 30 tablet 3   No current facility-administered  medications on file prior to visit.    Allergies  Allergen Reactions  . Penicillins Swelling and Rash    Swelling of whole body  Has patient had a PCN reaction causing immediate rash, facial/tongue/throat swelling, SOB or lightheadedness with hypotension: No Has patient had a PCN reaction causing severe rash involving mucus membranes or skin necrosis: No Has patient had a PCN reaction that required hospitalization No Has patient had a PCN reaction occurring within the last 10 years: No If all of the above answers are "NO", then may proceed with Cephalosporin use.     Objective: Physical Exam  General: Well developed, nourished, no acute distress, awake, alert and oriented x 3  Vascular: Dorsalis pedis artery 2/4 bilateral, Posterior tibial artery 1/4 bilateral, skin temperature warm to warm proximal to distal bilateral lower extremities, no varicosities, scant pedal hair present bilateral.  Neurological: Gross sensation present via light touch bilateral.   Dermatological: Skin is warm, dry, and supple bilateral, Nails 1-10 are tender, long, thick, and discolored with moderate subungal debris, no webspace macerations present bilateral, no open lesions present bilateral, no callus/corns/hyperkeratotic tissue present bilateral. There is significant dry skin with a few small patches of scaly skin in a annular fashion resembling tinea bilateral medial arch. No signs of infection bilateral.  Musculoskeletal: No boney deformities noted bilateral. Muscular strength within normal limits without pain on range of motion. No pain with calf compression bilateral.  Assessment and Plan:  Problem List Items Addressed This Visit    None    Visit Diagnoses    Dermatophytosis of nail    -  Primary    Relevant Medications    terbinafine (LAMISIL AT) 1 % cream    Xerosis of skin        Relevant Medications    terbinafine (LAMISIL AT) 1 % cream    Foot pain, bilateral        Tinea pedis of both  feet        Relevant Medications    terbinafine (LAMISIL AT) 1 % cream       -Examined patient.  -Discussed treatment options for painful mycotic nails and skin. -Mechanically debrided and reduced mycotic nails with sterile nail nipper and dremel nail file without incident. -Rx Lamisil AT cream to apply to both feet daily -Encouraged good hygiene habits  -Patient to return in 3 months for nail care or sooner if problems arise Richard Barnett, DPM

## 2015-05-22 ENCOUNTER — Ambulatory Visit (INDEPENDENT_AMBULATORY_CARE_PROVIDER_SITE_OTHER): Payer: Medicaid Other | Admitting: Family Medicine

## 2015-05-22 ENCOUNTER — Encounter: Payer: Self-pay | Admitting: Family Medicine

## 2015-05-22 VITALS — BP 138/68 | HR 53 | Temp 98.0°F | Resp 16 | Ht 67.0 in | Wt 218.0 lb

## 2015-05-22 DIAGNOSIS — E785 Hyperlipidemia, unspecified: Secondary | ICD-10-CM

## 2015-05-22 DIAGNOSIS — R7303 Prediabetes: Secondary | ICD-10-CM | POA: Diagnosis not present

## 2015-05-22 DIAGNOSIS — I10 Essential (primary) hypertension: Secondary | ICD-10-CM | POA: Diagnosis not present

## 2015-05-22 DIAGNOSIS — N183 Chronic kidney disease, stage 3 unspecified: Secondary | ICD-10-CM

## 2015-05-22 NOTE — Patient Instructions (Signed)
DASH Eating Plan °DASH stands for "Dietary Approaches to Stop Hypertension." The DASH eating plan is a healthy eating plan that has been shown to reduce high blood pressure (hypertension). Additional health benefits may include reducing the risk of type 2 diabetes mellitus, heart disease, and stroke. The DASH eating plan may also help with weight loss. °WHAT DO I NEED TO KNOW ABOUT THE DASH EATING PLAN? °For the DASH eating plan, you will follow these general guidelines: °· Choose foods with a percent daily value for sodium of less than 5% (as listed on the food label). °· Use salt-free seasonings or herbs instead of table salt or sea salt. °· Check with your health care provider or pharmacist before using salt substitutes. °· Eat lower-sodium products, often labeled as "lower sodium" or "no salt added." °· Eat fresh foods. °· Eat more vegetables, fruits, and low-fat dairy products. °· Choose whole grains. Look for the word "whole" as the first word in the ingredient list. °· Choose fish and skinless chicken or turkey more often than red meat. Limit fish, poultry, and meat to 6 oz (170 g) each day. °· Limit sweets, desserts, sugars, and sugary drinks. °· Choose heart-healthy fats. °· Limit cheese to 1 oz (28 g) per day. °· Eat more home-cooked food and less restaurant, buffet, and fast food. °· Limit fried foods. °· Cook foods using methods other than frying. °· Limit canned vegetables. If you do use them, rinse them well to decrease the sodium. °· When eating at a restaurant, ask that your food be prepared with less salt, or no salt if possible. °WHAT FOODS CAN I EAT? °Seek help from a dietitian for individual calorie needs. °Grains °Whole grain or whole wheat bread. Brown rice. Whole grain or whole wheat pasta. Quinoa, bulgur, and whole grain cereals. Low-sodium cereals. Corn or whole wheat flour tortillas. Whole grain cornbread. Whole grain crackers. Low-sodium crackers. °Vegetables °Fresh or frozen vegetables  (raw, steamed, roasted, or grilled). Low-sodium or reduced-sodium tomato and vegetable juices. Low-sodium or reduced-sodium tomato sauce and paste. Low-sodium or reduced-sodium canned vegetables.  °Fruits °All fresh, canned (in natural juice), or frozen fruits. °Meat and Other Protein Products °Ground beef (85% or leaner), grass-fed beef, or beef trimmed of fat. Skinless chicken or turkey. Ground chicken or turkey. Pork trimmed of fat. All fish and seafood. Eggs. Dried beans, peas, or lentils. Unsalted nuts and seeds. Unsalted canned beans. °Dairy °Low-fat dairy products, such as skim or 1% milk, 2% or reduced-fat cheeses, low-fat ricotta or cottage cheese, or plain low-fat yogurt. Low-sodium or reduced-sodium cheeses. °Fats and Oils °Tub margarines without trans fats. Light or reduced-fat mayonnaise and salad dressings (reduced sodium). Avocado. Safflower, olive, or canola oils. Natural peanut or almond butter. °Other °Unsalted popcorn and pretzels. °The items listed above may not be a complete list of recommended foods or beverages. Contact your dietitian for more options. °WHAT FOODS ARE NOT RECOMMENDED? °Grains °White bread. White pasta. White rice. Refined cornbread. Bagels and croissants. Crackers that contain trans fat. °Vegetables °Creamed or fried vegetables. Vegetables in a cheese sauce. Regular canned vegetables. Regular canned tomato sauce and paste. Regular tomato and vegetable juices. °Fruits °Dried fruits. Canned fruit in light or heavy syrup. Fruit juice. °Meat and Other Protein Products °Fatty cuts of meat. Ribs, chicken wings, bacon, sausage, bologna, salami, chitterlings, fatback, hot dogs, bratwurst, and packaged luncheon meats. Salted nuts and seeds. Canned beans with salt. °Dairy °Whole or 2% milk, cream, half-and-half, and cream cheese. Whole-fat or sweetened yogurt. Full-fat   cheeses or blue cheese. Nondairy creamers and whipped toppings. Processed cheese, cheese spreads, or cheese  curds. °Condiments °Onion and garlic salt, seasoned salt, table salt, and sea salt. Canned and packaged gravies. Worcestershire sauce. Tartar sauce. Barbecue sauce. Teriyaki sauce. Soy sauce, including reduced sodium. Steak sauce. Fish sauce. Oyster sauce. Cocktail sauce. Horseradish. Ketchup and mustard. Meat flavorings and tenderizers. Bouillon cubes. Hot sauce. Tabasco sauce. Marinades. Taco seasonings. Relishes. °Fats and Oils °Butter, stick margarine, lard, shortening, ghee, and bacon fat. Coconut, palm kernel, or palm oils. Regular salad dressings. °Other °Pickles and olives. Salted popcorn and pretzels. °The items listed above may not be a complete list of foods and beverages to avoid. Contact your dietitian for more information. °WHERE CAN I FIND MORE INFORMATION? °National Heart, Lung, and Blood Institute: www.nhlbi.nih.gov/health/health-topics/topics/dash/ °  °This information is not intended to replace advice given to you by your health care provider. Make sure you discuss any questions you have with your health care provider. °  °Document Released: 02/11/2011 Document Revised: 03/15/2014 Document Reviewed: 12/27/2012 °Elsevier Interactive Patient Education ©2016 Else[employer] Inc[employer] ° °Hypertension °Hypertension, commonly called high blood pressure, is when the force of blood pumping through your arteries is too strong. Your arteries are the blood vessels that carry blood from your heart throughout your body. A blood pressure reading consists of a higher number over a lower number, such as 110/72. The higher number (systolic) is the pressure inside your arteries when your heart pumps. The lower number (diastolic) is the pressure inside your arteries when your heart relaxes. Ideally you want your blood pressure below 120/80. °Hypertension forces your heart to work harder to pump blood. Your arteries may become narrow or stiff. Having untreated or uncontrolled hypertension can cause heart attack, stroke, kidney  disease, and other problems. °RISK FACTORS °Some risk factors for high blood pressure are controllable. Others are not.  °Risk factors you cannot control include:  °· Race. You may be at higher risk if you are African American. °· Age. Risk increases with age. °· Gender. Men are at higher risk than women before age 45 years. After age 65, women are at higher risk than men. °Risk factors you can control include: °· Not getting enough exercise or physical activity. °· Being overweight. °· Getting too much fat, sugar, calories, or salt in your diet. °· Drinking too much alcohol. °SIGNS AND SYMPTOMS °Hypertension does not usually cause signs or symptoms. Extremely high blood pressure (hypertensive crisis) may cause headache, anxiety, shortness of breath, and nosebleed. °DIAGNOSIS °To check if you have hypertension, your health care provider will measure your blood pressure while you are seated, with your arm held at the level of your heart. It should be measured at least twice using the same arm. Certain conditions can cause a difference in blood pressure between your right and left arms. A blood pressure reading that is higher than normal on one occasion does not mean that you need treatment. If it is not clear whether you have high blood pressure, you may be asked to return on a different day to have your blood pressure checked again. Or, you may be asked to monitor your blood pressure at home for 1 or more weeks. °TREATMENT °Treating high blood pressure includes making lifestyle changes and possibly taking medicine. Living a healthy lifestyle can help lower high blood pressure. You may need to change some of your habits. °Lifestyle changes may include: °· Following the DASH diet. This diet is high in fruits, vegetables, and whole   grains. It is low in salt, red meat, and added sugars. °· Keep your sodium intake below 2,300 mg per day. °· Getting at least 30-45 minutes of aerobic exercise at least 4 times per  week. °· Losing weight if necessary. °· Not smoking. °· Limiting alcoholic beverages. °· Learning ways to reduce stress. °Your health care provider may prescribe medicine if lifestyle changes are not enough to get your blood pressure under control, and if one of the following is true: °· You are 18-59 years of age and your systolic blood pressure is above 140. °· You are 60 years of age or older, and your systolic blood pressure is above 150. °· Your diastolic blood pressure is above 90. °· You have diabetes, and your systolic blood pressure is over 140 or your diastolic blood pressure is over 90. °· You have kidney disease and your blood pressure is above 140/90. °· You have heart disease and your blood pressure is above 140/90. °Your personal target blood pressure may vary depending on your medical conditions, your age, and other factors. °HOME CARE INSTRUCTIONS °· Have your blood pressure rechecked as directed by your health care provider.   °· Take medicines only as directed by your health care provider. Follow the directions carefully. Blood pressure medicines must be taken as prescribed. The medicine does not work as well when you skip doses. Skipping doses also puts you at risk for problems. °· Do not smoke.   °· Monitor your blood pressure at home as directed by your health care provider.  °SEEK MEDICAL CARE IF:  °· You think you are having a reaction to medicines taken. °· You have recurrent headaches or feel dizzy. °· You have swelling in your ankles. °· You have trouble with your vision. °SEEK IMMEDIATE MEDICAL CARE IF: °· You develop a severe headache or confusion. °· You have unusual weakness, numbness, or feel faint. °· You have severe chest or abdominal pain. °· You vomit repeatedly. °· You have trouble breathing. °MAKE SURE YOU:  °· Understand these instructions. °· Will watch your condition. °· Will get help right away if you are not doing well or get worse. °  °This information is not intended to  replace advice given to you by your health care provider. Make sure you discuss any questions you have with your health care provider. °  °Document Released: 02/22/2005 Document Revised: 07/09/2014 Document Reviewed: 12/15/2012 °Elsevier Interactive Patient Education ©2016 [employer] ° °

## 2015-05-22 NOTE — Progress Notes (Signed)
Subjective:    Patient ID: Richard Barnett, Richard Barnett    DOB: 1955/08/30, 60 y.o.   MRN: [id number]  HPI Richard Barnett, a 60 year old Richard Barnett with a history of hypertension present  for a follow-up of hypertension and chronic CKD stage 3. Patient recently started taking antihypertensive medications consistently. He has been receiving pre-packaged pill box from pharmacy to assist with adherence to medication regimen over the past month. Patient is enjoying this service and states that everything is going well. Patient maintains that his daughter has been providing assistance with meal preparation to ensure that he eats healthier. He states that he has not been exercising, but remains active. Patient was evaluated by Dr. Hyman Hopes, nephrologist 1 month ago for chronic kidney disease stage 3. He currently denies headache, dizziness, lightheadnessed, chest pain, heart palpitations, shortness of breath, dysuria, back pain, nausea, vomiting, or diarrhea.   Past Medical History  Diagnosis Date  . Hypertension   . Hypercholesterolemia   . Heart murmur   . CHF (congestive heart failure) (HCC)   . Anginal pain (HCC)   . MI (myocardial infarction) (HCC)     "I've had couple when I was asleep; a couple when I was awake" (08/22/2014)  . DVT (deep venous thrombosis) (HCC) ?    RLE  . Daily headache   . Depression     "I always get that" (08/22/2014)  . Chronic kidney disease     "from my blood pressure problems" (08/22/2014)  . Chronic renal insufficiency     Richard Barnett 08/22/2014  . Arthritis     "right leg" (08/22/2014)  . Diabetes mellitus without complication (HCC)   There is no immunization history for the selected administration types on file for this patient.  Social History   Social History  . Marital Status: Divorced    Spouse Name: N/A  . Number of Children: N/A  . Years of Education: N/A   Occupational History  . Not on file.   Social History Main Topics  . Smoking status: Former Smoker -- 0.00  packs/day for 40 years    Types: Cigarettes    Quit date: 08/29/2010  . Smokeless tobacco: Never Used     Comment: "quit smoking cigarettes in ~ 2011"  . Alcohol Use: No     Comment: 08/22/2014 "stopped in ~ 2011"  . Drug Use: No     Comment: 08/22/2014 "stopped in ~ 2011; all types of drugs""  . Sexual Activity: No   Other Topics Concern  . Not on file   Social History Narrative   Allergies  Allergen Reactions  . Penicillins Swelling and Rash    Swelling of whole body  Has patient had a PCN reaction causing immediate rash, facial/tongue/throat swelling, SOB or lightheadedness with hypotension: No Has patient had a PCN reaction causing severe rash involving mucus membranes or skin necrosis: No Has patient had a PCN reaction that required hospitalization No Has patient had a PCN reaction occurring within the last 10 years: No If all of the above answers are "NO", then may proceed with Cephalosporin use.    Review of Systems  Constitutional: Negative for fever, diaphoresis, fatigue and unexpected weight change.  Eyes: Negative.  Negative for visual disturbance.  Respiratory: Negative.  Negative for chest tightness.   Cardiovascular: Negative.   Gastrointestinal: Negative for nausea, vomiting, diarrhea, constipation, blood in stool, abdominal distention, anal bleeding and rectal pain.  Endocrine: Negative for polydipsia, polyphagia and polyuria.  Genitourinary: Negative.   Musculoskeletal:  Negative.  Negative for back pain.  Skin: Negative.   Allergic/Immunologic: Negative for immunocompromised state.  Neurological: Negative.  Negative for dizziness, tremors, syncope and headaches.  Hematological: Negative.   Psychiatric/Behavioral: Negative.  Negative for suicidal ideas, behavioral problems, confusion, sleep disturbance and decreased concentration.       Objective:   Physical Exam  Constitutional: He is oriented to person, place, and time. He appears well-developed and  well-nourished.  HENT:  Head: Normocephalic and atraumatic.  Right Ear: External ear normal.  Left Ear: External ear normal.  Nose: Nose normal.  Mouth/Throat: Oropharynx is clear and moist.  Eyes: Conjunctivae and EOM are normal. Pupils are equal, round, and reactive to light.  Neck: Normal range of motion. Neck supple.  Cardiovascular: Normal rate, regular rhythm, normal heart sounds and intact distal pulses.   Pulmonary/Chest: Effort normal and breath sounds normal.  Abdominal: Soft. Bowel sounds are normal. There is no tenderness.  Increased abdominal girth  Musculoskeletal: Normal range of motion.  Neurological: He is alert and oriented to person, place, and time. He has normal reflexes.  Skin: Skin is warm and dry.  Psychiatric: He has a normal mood and affect. His behavior is normal. Judgment and thought content normal.     BP 138/68 mmHg  Pulse 53  Temp(Src) 98 F (36.7 C) (Oral)  Resp 16  Ht 5\' 7"  (1.702 Barnett)  Wt 218 lb (98.884 kg)  BMI 34.14 kg/m2 Assessment & Plan:   1. Essential hypertension Blood pressure is at goal on current medication rigimen. Will continue medications at current dosages.  He has been taking medications consistently. Recommend a lowfat, low carbohydrate diet divided over 5-6 small meals, increase water intake to 6-8 glasses, and 150 minutes per week of cardiovascular exercise.   2. Chronic kidney disease, stage 3 (moderate) Previously referred to nephrology. Richard Barnett was evaluated by Dr. Hyman Hopes, nephrology 1 month ago.  Will review notes as they become available.   3. Hyperlipidemia Will continue statin therapy. Will check fasting lipid panel in 3 months. The patient is asked to make an attempt to improve diet and exercise patterns to aid in medical management of this problem..   4. Prediabetes Daughter is continuing to manage meals. He was also sent to diabetes & nutrition. Will follow up .     RTC: 3 months for  hypertension    Richard Wempe M, FNP  The patient was given clear instructions to go to ER or return to medical center if symptoms do not improve, worsen or new problems develop. The patient verbalized understanding. Will notify patient with laboratory results.

## 2015-05-23 ENCOUNTER — Ambulatory Visit
Admission: RE | Admit: 2015-05-23 | Discharge: 2015-05-23 | Disposition: A | Discharge status: Home / Self Care | Payer: Medicaid Other | Source: Ambulatory Visit | Attending: Nephrology | Admitting: Nephrology

## 2015-05-23 DIAGNOSIS — N184 Chronic kidney disease, stage 4 (severe): Secondary | ICD-10-CM

## 2015-07-24 ENCOUNTER — Other Ambulatory Visit: Payer: Self-pay | Admitting: Family Medicine

## 2015-07-29 ENCOUNTER — Other Ambulatory Visit (HOSPITAL_COMMUNITY): Payer: Self-pay | Admitting: Nephrology

## 2015-07-29 DIAGNOSIS — R809 Proteinuria, unspecified: Secondary | ICD-10-CM

## 2015-07-29 DIAGNOSIS — N189 Chronic kidney disease, unspecified: Secondary | ICD-10-CM

## 2015-07-29 DIAGNOSIS — N184 Chronic kidney disease, stage 4 (severe): Secondary | ICD-10-CM

## 2015-07-29 DIAGNOSIS — D631 Anemia in chronic kidney disease: Secondary | ICD-10-CM

## 2015-08-07 ENCOUNTER — Other Ambulatory Visit: Payer: Self-pay | Admitting: General Surgery

## 2015-08-07 ENCOUNTER — Other Ambulatory Visit: Payer: Self-pay | Admitting: Radiology

## 2015-08-08 ENCOUNTER — Ambulatory Visit (HOSPITAL_COMMUNITY): Admission: RE | Admit: 2015-08-08 | Payer: Medicaid Other | Source: Ambulatory Visit

## 2015-08-22 ENCOUNTER — Ambulatory Visit: Payer: Self-pay | Admitting: Family Medicine

## 2015-08-25 ENCOUNTER — Ambulatory Visit: Payer: Medicaid Other | Admitting: Sports Medicine

## 2015-08-25 ENCOUNTER — Other Ambulatory Visit: Payer: Self-pay | Admitting: Family Medicine

## 2015-09-02 ENCOUNTER — Other Ambulatory Visit: Payer: Self-pay | Admitting: Radiology

## 2015-09-03 ENCOUNTER — Ambulatory Visit (HOSPITAL_COMMUNITY): Admission: RE | Admit: 2015-09-03 | Payer: Medicaid Other | Source: Ambulatory Visit

## 2015-09-05 ENCOUNTER — Emergency Department (HOSPITAL_COMMUNITY)
Admission: EM | Admit: 2015-09-05 | Discharge: 2015-09-05 | Disposition: A | Discharge status: Home / Self Care | Payer: Medicaid Other | Attending: Emergency Medicine | Admitting: Emergency Medicine

## 2015-09-05 ENCOUNTER — Encounter (HOSPITAL_COMMUNITY): Payer: Self-pay

## 2015-09-05 ENCOUNTER — Emergency Department (HOSPITAL_COMMUNITY): Payer: Medicaid Other

## 2015-09-05 DIAGNOSIS — Y939 Activity, unspecified: Secondary | ICD-10-CM | POA: Insufficient documentation

## 2015-09-05 DIAGNOSIS — Y9289 Other specified places as the place of occurrence of the external cause: Secondary | ICD-10-CM | POA: Insufficient documentation

## 2015-09-05 DIAGNOSIS — I509 Heart failure, unspecified: Secondary | ICD-10-CM | POA: Insufficient documentation

## 2015-09-05 DIAGNOSIS — I130 Hypertensive heart and chronic kidney disease with heart failure and stage 1 through stage 4 chronic kidney disease, or unspecified chronic kidney disease: Secondary | ICD-10-CM | POA: Insufficient documentation

## 2015-09-05 DIAGNOSIS — S62309A Unspecified fracture of unspecified metacarpal bone, initial encounter for closed fracture: Secondary | ICD-10-CM

## 2015-09-05 DIAGNOSIS — Z7982 Long term (current) use of aspirin: Secondary | ICD-10-CM | POA: Diagnosis not present

## 2015-09-05 DIAGNOSIS — E119 Type 2 diabetes mellitus without complications: Secondary | ICD-10-CM | POA: Insufficient documentation

## 2015-09-05 DIAGNOSIS — I2510 Atherosclerotic heart disease of native coronary artery without angina pectoris: Secondary | ICD-10-CM | POA: Diagnosis not present

## 2015-09-05 DIAGNOSIS — N189 Chronic kidney disease, unspecified: Secondary | ICD-10-CM | POA: Diagnosis not present

## 2015-09-05 DIAGNOSIS — I252 Old myocardial infarction: Secondary | ICD-10-CM | POA: Diagnosis not present

## 2015-09-05 DIAGNOSIS — Y999 Unspecified external cause status: Secondary | ICD-10-CM | POA: Diagnosis not present

## 2015-09-05 DIAGNOSIS — S62317A Displaced fracture of base of fifth metacarpal bone. left hand, initial encounter for closed fracture: Secondary | ICD-10-CM | POA: Diagnosis not present

## 2015-09-05 DIAGNOSIS — Z87891 Personal history of nicotine dependence: Secondary | ICD-10-CM | POA: Diagnosis not present

## 2015-09-05 DIAGNOSIS — S6992XA Unspecified injury of left wrist, hand and finger(s), initial encounter: Secondary | ICD-10-CM | POA: Diagnosis present

## 2015-09-05 MED ORDER — TETANUS-DIPHTH-ACELL PERTUSSIS 5-2.5-18.5 LF-MCG/0.5 IM SUSP
0.5000 mL | Freq: Once | INTRAMUSCULAR | Status: AC
  Administered 2015-09-05: 0.5 mL via INTRAMUSCULAR
  Filled 2015-09-05: qty 0.5

## 2015-09-05 MED ORDER — OXYCODONE-ACETAMINOPHEN 5-325 MG PO TABS
1.0000 | ORAL_TABLET | Freq: Once | ORAL | Status: AC
  Administered 2015-09-05: 1 via ORAL
  Filled 2015-09-05: qty 1

## 2015-09-05 MED ORDER — OXYCODONE-ACETAMINOPHEN 5-325 MG PO TABS: 1.0000 | ORAL_TABLET | Freq: Four times a day (QID) | ORAL | Status: DC | PRN
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
## 2015-09-05 NOTE — ED Notes (Signed)
Refuses vital signs at discharge.

## 2015-09-05 NOTE — Progress Notes (Signed)
Orthopedic Tech Progress Note Patient Details:  Richard Barnett Jun 25, 1955 [id number]  Ortho Devices Ortho Device/Splint Location: lue Ortho Device/Splint Interventions: Application Ulna gutter  Nikki Dom 09/05/2015, 11:07 AM

## 2015-09-05 NOTE — Discharge Instructions (Signed)
Read the information below.   You have a fracture in your left hand. You were given pain medication and a splint. You were also given a booster shot. It is important that you wear your splint and arm sling. Do not get your splint wet. Ice the area for 20 minute increments. You have been prescribed pain medication. Take as needed for severe pain. You can take ibuprofen for mild to moderate pain.  Keep wound clean and dry and apply antibiotic ointment.  It is important that you follow up with your family doctor any time you are in the emergency department. Be sure to schedule a follow up appointment.   It is important that you call and follow up with orthopedics regarding the fracture in your hand. I have provided the contact information. Give them a call to schedule a follow up appointment next week.  Use the prescribed medication as directed.  Please discuss all new medications with your pharmacist.   You may return to the Emergency Department at any time for worsening condition or any new symptoms that concern you. Return to ED if your symptoms worsen of you develop 1)fever, redness, purulent discharge from wound, or develop severe pain, numbness, or weakness in handor 2) if you develop headache, dizziness, changes in vision, nausea, or vomiting.     Cast or Splint Care Casts and splints support injured limbs and keep bones from moving while they heal. It is important to care for your cast or splint at home.  HOME CARE INSTRUCTIONS  Keep the cast or splint uncovered during the drying period. It can take 24 to 48 hours to dry if it is made of plaster. A fiberglass cast will dry in less than 1 hour.  Do not rest the cast on anything harder than a pillow for the first 24 hours.  Do not put weight on your injured limb or apply pressure to the cast until your health care provider gives you permission.  Keep the cast or splint dry. Wet casts or splints can lose their shape and may not support the limb  as well. A wet cast that has lost its shape can also create harmful pressure on your skin when it dries. Also, wet skin can become infected.  Cover the cast or splint with a plastic bag when bathing or when out in the rain or snow. If the cast is on the trunk of the body, take sponge baths until the cast is removed.  If your cast does become wet, dry it with a towel or a blow dryer on the cool setting only.  Keep your cast or splint clean. Soiled casts may be wiped with a moistened cloth.  Do not place any hard or soft foreign objects under your cast or splint, such as cotton, toilet paper, lotion, or powder.  Do not try to scratch the skin under the cast with any object. The object could get stuck inside the cast. Also, scratching could lead to an infection. If itching is a problem, use a blow dryer on a cool setting to relieve discomfort.  Do not trim or cut your cast or remove padding from inside of it.  Exercise all joints next to the injury that are not immobilized by the cast or splint. For example, if you have a long leg cast, exercise the hip joint and toes. If you have an arm cast or splint, exercise the shoulder, elbow, thumb, and fingers.  Elevate your injured arm or leg on  1 or 2 pillows for the first 1 to 3 days to decrease swelling and pain.It is best if you can comfortably elevate your cast so it is higher than your heart. SEEK MEDICAL CARE IF:   Your cast or splint cracks.  Your cast or splint is too tight or too loose.  You have unbearable itching inside the cast.  Your cast becomes wet or develops a soft spot or area.  You have a bad smell coming from inside your cast.  You get an object stuck under your cast.  Your skin around the cast becomes red or raw.  You have new pain or worsening pain after the cast has been applied. SEEK IMMEDIATE MEDICAL CARE IF:   You have fluid leaking through the cast.  You are unable to move your fingers or toes.  You have  discolored (blue or white), cool, painful, or very swollen fingers or toes beyond the cast.  You have tingling or numbness around the injured area.  You have severe pain or pressure under the cast.  You have any difficulty with your breathing or have shortness of breath.  You have chest pain.   This information is not intended to replace advice given to you by your health care provider. Make sure you discuss any questions you have with your health care provider.   Document Released: 02/20/2000 Document Revised: 12/13/2012 Document Reviewed: 08/31/2012 Elsevier Interactive Patient Education 2016 [employer]  Metacarpal Fracture Fractures of metacarpals are breaks in the bones of the hand. They extend from the knuckles to the wrist. These bones can break in many ways. There are different ways of treating these fractures. HOME CARE  Only exercise as told by your doctor.  Return to activities as told by your doctor.  Go to physical therapy as told by your doctor.  Follow your doctor's advice about driving.  Keep the injured hand raised (elevated) above the level of your heart.  If a plaster, fiberglass, or pre-formed splint was applied:  Wear your splint as told and until you are examined again.  Apply ice on the injury for 15-20 minutes at a time, 03-04 times a day. Put the ice in a plastic bag. Place a towel between your skin and the bag.  Do not get your splint or cast wet. Protect it during bathing with a plastic bag.  Loosen the elastic bandage around the splint if your fingers start to get numb, tingle, get cold, or turn blue.  If the splint is plaster, do not lean it on hard surfaces or put pressure on it for 24 hours after it is put on.  Do not  try to scratch the skin under the cast.  Check the skin around the cast every day. You may put lotion on red or sore areas.  Move the fingers of your casted hand several times a day.  Only take medicine as told by your  doctor.  Follow up as told by your doctor. This is very important in order to avoid permanent injury, disability, or lasting (chronic) pain. GET HELP RIGHT AWAY IF:   You develop a rash.  You have problems breathing.  You have any allergy problems.  You have more than a small spot of blood from beneath your cast or splint.  You have redness, puffiness (swelling), or more pain from beneath your cast or splint.  Yellowish-white fluid (pus) comes from beneath your cast or splint.  You develop a temperature by mouth above 102  F (38.9 C), not controlled by medicine.  You have a bad smell coming from under your cast or splint.  You have problems moving any of your fingers. If you do not have a window in your cast for looking at the wound, a fluid or a little bleeding may show up as a stain on the outside of your cast. Tell your doctor about any stains you see. MAKE SURE YOU:   Understand these instructions.  Will watch your condition.  Will get help right away if you are not doing well or get worse.   This information is not intended to replace advice given to you by your health care provider. Make sure you discuss any questions you have with your health care provider.   Document Released: 08/11/2007 Document Revised: 03/15/2014 Document Reviewed: 12/12/2013 Elsevier Interactive Patient Education 2016 [employer].  General Assault Assault includes any behavior or physical attack--whether it is on purpose or not--that results in injury to another person, damage to property, or both. This also includes assault that has not yet happened, but is planned to happen. Threats of assault may be physical, verbal, or written. They may be said or sent by:  Mail.  E-mail.  Text.  Social media.  Fax. The threats may be direct, implied, or understood. WHAT ARE THE DIFFERENT FORMS OF ASSAULT? Forms of assault include:  Physically assaulting a person. This includes physical threats  to inflict physical harm as well as:  Slapping.  Hitting.  Poking.  Kicking.  Punching.  Pushing.  Sexually assaulting a person. Sexual assault is any sexual activity that a person is forced, threatened, or coerced to participate in. It may or may not involve physical contact with the person who is assaulting you. You are sexually assaulted if you are forced to have sexual contact of any kind.  Damaging or destroying a person's assistive equipment, such as glasses, canes, or walkers.  Throwing or hitting objects.  Using or displaying a weapon to harm or threaten someone.  Using or displaying an object that appears to be a weapon in a threatening manner.  Using greater physical size or strength to intimidate someone.  Making intimidating or threatening gestures.  Bullying.  Hazing.  Using language that is intimidating, threatening, hostile, or abusive.  Stalking.  Restraining someone with force. WHAT SHOULD I DO IF I EXPERIENCE ASSAULT?  Report assaults, threats, and stalking to the police. Call your local emergency services (911 in the [place] if you are in immediate danger or you need medical help.  You can work with a Clinical research associate or an advocate to get legal protection against someone who has assaulted you or threatened you with assault. Protection includes restraining orders and private addresses. Crimes against you, such as assault, can also be prosecuted through the courts. Laws will vary depending on where you live.   This information is not intended to replace advice given to you by your health care provider. Make sure you discuss any questions you have with your health care provider.   Document Released: 02/22/2005 Document Revised: 03/15/2014 Document Reviewed: 11/09/2013 Elsevier Interactive Patient Education [employer]

## 2015-09-05 NOTE — ED Notes (Signed)
Pt states his pain is fine butr will not give number. Pt refuses vital signs. Pt states he understands  Instructions.

## 2015-09-05 NOTE — ED Notes (Signed)
Ortho tech at bedside. Wound at left arm cleaned and dressed with bandaid and bacitracin. Small abrasion noted when cleaned.

## 2015-09-05 NOTE — ED Notes (Signed)
Patient very agitated at this time. Attempt to obtain vital signs and patient refuses. States " just get me out of here" multiple times.

## 2015-09-18 ENCOUNTER — Other Ambulatory Visit: Payer: Self-pay | Admitting: Radiology

## 2015-09-22 ENCOUNTER — Ambulatory Visit (HOSPITAL_COMMUNITY): Admission: RE | Admit: 2015-09-22 | Payer: Medicaid Other | Source: Ambulatory Visit

## 2015-09-23 DIAGNOSIS — F32A Depression, unspecified: Secondary | ICD-10-CM | POA: Insufficient documentation

## 2015-09-23 DIAGNOSIS — R011 Cardiac murmur, unspecified: Secondary | ICD-10-CM | POA: Insufficient documentation

## 2015-09-23 DIAGNOSIS — N289 Disorder of kidney and ureter, unspecified: Secondary | ICD-10-CM | POA: Insufficient documentation

## 2015-09-23 DIAGNOSIS — I219 Acute myocardial infarction, unspecified: Secondary | ICD-10-CM | POA: Insufficient documentation

## 2015-09-23 DIAGNOSIS — I2129 ST elevation (STEMI) myocardial infarction involving other sites: Secondary | ICD-10-CM

## 2015-09-23 DIAGNOSIS — R809 Proteinuria, unspecified: Secondary | ICD-10-CM | POA: Insufficient documentation

## 2015-09-23 DIAGNOSIS — F329 Major depressive disorder, single episode, unspecified: Secondary | ICD-10-CM | POA: Insufficient documentation

## 2015-09-23 DIAGNOSIS — I82409 Acute embolism and thrombosis of unspecified deep veins of unspecified lower extremity: Secondary | ICD-10-CM | POA: Insufficient documentation

## 2015-09-23 DIAGNOSIS — M1990 Unspecified osteoarthritis, unspecified site: Secondary | ICD-10-CM | POA: Insufficient documentation

## 2015-09-23 DIAGNOSIS — N2581 Secondary hyperparathyroidism of renal origin: Secondary | ICD-10-CM

## 2015-09-23 DIAGNOSIS — E7800 Pure hypercholesterolemia, unspecified: Secondary | ICD-10-CM | POA: Insufficient documentation

## 2015-09-23 DIAGNOSIS — I509 Heart failure, unspecified: Secondary | ICD-10-CM | POA: Insufficient documentation

## 2015-09-23 DIAGNOSIS — N189 Chronic kidney disease, unspecified: Secondary | ICD-10-CM | POA: Insufficient documentation

## 2015-09-23 DIAGNOSIS — D631 Anemia in chronic kidney disease: Secondary | ICD-10-CM | POA: Insufficient documentation

## 2015-09-25 ENCOUNTER — Ambulatory Visit: Payer: Self-pay | Admitting: Internal Medicine

## 2015-10-03 ENCOUNTER — Emergency Department (HOSPITAL_COMMUNITY): Payer: Medicaid Other

## 2015-10-03 ENCOUNTER — Encounter (HOSPITAL_COMMUNITY): Payer: Self-pay

## 2015-10-03 ENCOUNTER — Emergency Department (HOSPITAL_COMMUNITY)
Admission: EM | Admit: 2015-10-03 | Discharge: 2015-10-03 | Disposition: A | Discharge status: Home / Self Care | Payer: Medicaid Other | Attending: Emergency Medicine | Admitting: Emergency Medicine

## 2015-10-03 DIAGNOSIS — S0990XA Unspecified injury of head, initial encounter: Secondary | ICD-10-CM | POA: Diagnosis not present

## 2015-10-03 DIAGNOSIS — Y92410 Unspecified street and highway as the place of occurrence of the external cause: Secondary | ICD-10-CM | POA: Insufficient documentation

## 2015-10-03 DIAGNOSIS — I2510 Atherosclerotic heart disease of native coronary artery without angina pectoris: Secondary | ICD-10-CM | POA: Diagnosis not present

## 2015-10-03 DIAGNOSIS — N184 Chronic kidney disease, stage 4 (severe): Secondary | ICD-10-CM | POA: Insufficient documentation

## 2015-10-03 DIAGNOSIS — I130 Hypertensive heart and chronic kidney disease with heart failure and stage 1 through stage 4 chronic kidney disease, or unspecified chronic kidney disease: Secondary | ICD-10-CM | POA: Diagnosis not present

## 2015-10-03 DIAGNOSIS — Y999 Unspecified external cause status: Secondary | ICD-10-CM | POA: Insufficient documentation

## 2015-10-03 DIAGNOSIS — Z955 Presence of coronary angioplasty implant and graft: Secondary | ICD-10-CM | POA: Diagnosis not present

## 2015-10-03 DIAGNOSIS — W19XXXA Unspecified fall, initial encounter: Secondary | ICD-10-CM

## 2015-10-03 DIAGNOSIS — E1122 Type 2 diabetes mellitus with diabetic chronic kidney disease: Secondary | ICD-10-CM | POA: Diagnosis not present

## 2015-10-03 DIAGNOSIS — S01111A Laceration without foreign body of right eyelid and periocular area, initial encounter: Secondary | ICD-10-CM | POA: Insufficient documentation

## 2015-10-03 DIAGNOSIS — I252 Old myocardial infarction: Secondary | ICD-10-CM | POA: Diagnosis not present

## 2015-10-03 DIAGNOSIS — S0993XA Unspecified injury of face, initial encounter: Secondary | ICD-10-CM | POA: Diagnosis present

## 2015-10-03 DIAGNOSIS — Z87891 Personal history of nicotine dependence: Secondary | ICD-10-CM | POA: Diagnosis not present

## 2015-10-03 DIAGNOSIS — Y939 Activity, unspecified: Secondary | ICD-10-CM | POA: Diagnosis not present

## 2015-10-03 DIAGNOSIS — Z7982 Long term (current) use of aspirin: Secondary | ICD-10-CM | POA: Diagnosis not present

## 2015-10-03 DIAGNOSIS — I509 Heart failure, unspecified: Secondary | ICD-10-CM | POA: Insufficient documentation
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
## 2015-11-17 ENCOUNTER — Other Ambulatory Visit: Payer: Self-pay | Admitting: Family Medicine

## 2015-11-22 ENCOUNTER — Emergency Department (HOSPITAL_COMMUNITY)
Admission: EM | Admit: 2015-11-22 | Discharge: 2015-11-22 | Disposition: A | Discharge status: Home / Self Care | Payer: Medicaid Other | Attending: Emergency Medicine | Admitting: Emergency Medicine

## 2015-11-22 DIAGNOSIS — H268 Other specified cataract: Secondary | ICD-10-CM | POA: Diagnosis not present

## 2015-11-22 DIAGNOSIS — Z7982 Long term (current) use of aspirin: Secondary | ICD-10-CM | POA: Diagnosis not present

## 2015-11-22 DIAGNOSIS — Z87891 Personal history of nicotine dependence: Secondary | ICD-10-CM | POA: Diagnosis not present

## 2015-11-22 DIAGNOSIS — H578 Other specified disorders of eye and adnexa: Secondary | ICD-10-CM | POA: Diagnosis present

## 2015-11-22 DIAGNOSIS — I509 Heart failure, unspecified: Secondary | ICD-10-CM | POA: Insufficient documentation

## 2015-11-22 DIAGNOSIS — Z79899 Other long term (current) drug therapy: Secondary | ICD-10-CM | POA: Diagnosis not present

## 2015-11-22 DIAGNOSIS — N184 Chronic kidney disease, stage 4 (severe): Secondary | ICD-10-CM | POA: Insufficient documentation

## 2015-11-22 DIAGNOSIS — I130 Hypertensive heart and chronic kidney disease with heart failure and stage 1 through stage 4 chronic kidney disease, or unspecified chronic kidney disease: Secondary | ICD-10-CM | POA: Insufficient documentation

## 2015-11-22 DIAGNOSIS — H269 Unspecified cataract: Secondary | ICD-10-CM

## 2015-11-22 DIAGNOSIS — E1122 Type 2 diabetes mellitus with diabetic chronic kidney disease: Secondary | ICD-10-CM | POA: Insufficient documentation

## 2015-11-22 LAB — CBG MONITORING, ED: GLUCOSE-CAPILLARY: 113 mg/dL — AB (ref 65–99)

## 2015-11-22 NOTE — ED Provider Notes (Signed)
[facility]-EMERGENCY DEPT Provider Note   CSN: [id number] Arrival date & time: 11/22/15  1737  By signing my name below, I, Nelwyn Salisbury, attest that this documentation has been prepared under the direction and in the presence of non-physician practitioner, Elizabeth Sauer, PA-C. Electronically Signed: Nelwyn Salisbury, Scribe. 11/22/2015. 7:32 PM.   History   Chief Complaint Chief Complaint  Patient presents with  . Eye Pain  . Cloudy Vision   The history is provided by the patient. No language interpreter was used.   HPI Comments:  Richard Barnett is a 60 y.o. [place] who presents to the Emergency Department complaining of gradual-onset worsening blurred vision x 2 weeks.  Pt states he was recently seen at the vision center at [facility] and was told he had cataracts in both eyes. He states that the vision center told him he was "going blind" but did not tell him what to do about it. He states that he came to ED today for someone to tell him what cataracts are and what he is suppose to do about it.   Past Medical History:  Diagnosis Date  . Anginal pain (HCC)   . Arthritis    "right leg" (08/22/2014)  . CHF (congestive heart failure) (HCC)   . Chronic kidney disease    "from my blood pressure problems" (08/22/2014)  . Chronic renal insufficiency    [name] 08/22/2014  . Daily headache   . Depression    "I always get that" (08/22/2014)  . Diabetes mellitus without complication (HCC)   . DVT (deep venous thrombosis) (HCC) ?   RLE  . Heart murmur   . Hypercholesterolemia   . Hypertension   . MI (myocardial infarction) (HCC)    "I've had couple when I was asleep; a couple when I was awake" (08/22/2014)    Patient Active Problem List   Diagnosis Date Noted  . Anemia of chronic renal failure   . Secondary hyperparathyroidism of renal origin (HCC)   . Proteinuria   . Arthritis   . Depression   . Deep vein thrombosis (HCC)   . Myocardial infarction (HCC)   . Heart failure, unspecified (HCC)   .  Heart murmur   . Prediabetes 02/22/2015  . Chronic kidney disease, stage IV (severe) (HCC)   . Pain in the chest   . Hyperlipidemia 05/22/2014  . Chest pain 05/21/2014  . Hypokalemia 05/21/2014  . Chronic kidney disease 05/21/2014  . CAD (coronary artery disease) 05/21/2014  . Hypertension 05/21/2014    Past Surgical History:  Procedure Laterality Date  . CORONARY ANGIOPLASTY WITH STENT PLACEMENT     "1 + 1"     Home Medications    Prior to Admission medications   Medication Sig Start Date End Date Taking? Authorizing Provider  amLODipine (NORVASC) 10 MG tablet TAKE ONE (1) TABLET BY MOUTH EVERY DAY 11/17/15   Henrietta Hoover, NP  aspirin 81 MG EC tablet Take 1 tablet by mouth daily 08/25/15   Massie Maroon, FNP  atorvastatin (LIPITOR) 40 MG tablet TAKE ONE TABLET BY MOUTH EVERY EVENING AT SIX P.M. 11/17/15   Henrietta Hoover, NP  citalopram (CELEXA) 40 MG tablet Take 40 mg by mouth daily.    Historical Provider, MD  cloNIDine (CATAPRES) 0.2 MG tablet TAKE ONE (1) TABLET BY MOUTH TWO (2) TIMES DAILY 11/17/15   Henrietta Hoover, NP  isosorbide mononitrate (IMDUR) 30 MG 24 hr tablet TAKE 1/2 TABLET BY MOUTH DAILY 11/17/15   Henrietta Hoover,  NP  metoprolol (LOPRESSOR) 50 MG tablet TAKE ONE (1) TABLET BY MOUTH TWO (2) TIMES DAILY 11/17/15   Henrietta Hoover, NP  nitroGLYCERIN (NITROSTAT) 0.4 MG SL tablet Place 1 tablet (0.4 mg total) under the tongue every 5 (five) minutes as needed for chest pain. 03/31/15   Massie Maroon, FNP  oxyCODONE-acetaminophen (PERCOCET/ROXICET) 5-325 MG tablet Take 1 tablet by mouth every 6 (six) hours as needed for severe pain. 09/05/15   Lona Kettle, PA-C    Family History Family History  Problem Relation Age of Onset  . Hypertension Mother   . Kidney disease Mother   . Heart disease Mother   . Heart disease Father   . Hypertension Father     Social History Social History  Substance Use Topics  . Smoking status: Former Smoker     Packs/day: 0.00    Years: 40.00    Types: Cigarettes    Quit date: 08/29/2010  . Smokeless tobacco: Never Used     Comment: "quit smoking cigarettes in ~ 2011"  . Alcohol use No     Comment: 08/22/2014 "stopped in ~ 2011"     Allergies   Penicillins   Review of Systems Review of Systems  Constitutional: Negative for fever.  Eyes: Positive for redness and visual disturbance.     Physical Exam Updated Vital Signs BP 140/85 (BP Location: Left Arm)   Pulse 64   Temp 97.8 F (36.6 C) (Oral)   Resp 16   SpO2 100%   Physical Exam  Constitutional: He is oriented to person, place, and time. He appears well-developed and well-nourished. No distress.  HENT:  Head: Normocephalic and atraumatic.  Eyes: EOM are normal. Pupils are equal, round, and reactive to light.  Visual fields intact. Red reflex present but diminished bilaterally. Slight haziness to cornea consistent with cataracts.  No pain with EOM. IOP right 18, IOP left 21.   Cardiovascular: Normal rate and regular rhythm.   Pulmonary/Chest: Effort normal. No respiratory distress.  Neurological: He is alert and oriented to person, place, and time.  Skin: Skin is warm and dry.  Psychiatric: He has a normal mood and affect.  Nursing note and vitals reviewed.  ED Treatments / Results  DIAGNOSTIC STUDIES:  Oxygen Saturation is 100% on RA, normal by my interpretation.    COORDINATION OF CARE:  8:14 PM Discussed treatment plan with pt at bedside which included referral to optometrist and pt agreed to plan.  Labs (all labs ordered are listed, but only abnormal results are displayed) Labs Reviewed  CBG MONITORING, ED - Abnormal; Notable for the following:       Result Value   Glucose-Capillary 113 (*)    All other components within normal limits  CBG MONITORING, ED    EKG  EKG Interpretation None       Radiology No results found.  Procedures Procedures (including critical care time)  Medications Ordered in  ED Medications - No data to display   Initial Impression / Assessment and Plan / ED Course  I have reviewed the triage vital signs and the nursing notes.  Pertinent labs & imaging results that were available during my care of the patient were reviewed by me and considered in my medical decision making (see chart for details).  Clinical Course   Richard Barnett is a 60 y.o. [place] who presents to ED for two weeks of progressive visual loss. He informed triage that he was seen by [facility], but told me  [facility] after this evaluation, he was told that he had cataracts but appears that he did not understand his diagnosis. He was told to see an eye doctor but seemed very confused because he thought they were the eye doctor. Likely wanted him to see ophthalmologist but he did not understand this. Doubt retinal detachment or AV occlusion. IOP within normal limits bilaterally. Unlikely glaucoma. Visual fields intact. Agree with cataract diagnosis. I had a long discussion with patient explaining condition and that he needed to be seen by eye doctor Monday. Instructions of who to call was included in discharge information.    Final Clinical Impressions(s) / ED Diagnoses   Final diagnoses:  Cataracts, bilateral    New Prescriptions Discharge Medication List as of 11/22/2015  8:23 PM     I personally performed the services described in this documentation, which was scribed in my presence. The recorded information has been reviewed and is accurate.     [facility] Pilcher Alora Gorey, PA-C 11/22/15 2332    Melene Planan Floyd, DO 11/23/15 [phone]

## 2015-11-22 NOTE — ED Notes (Addendum)
Attempted to locate pt. Pt not in triage area.  Richard Barnett EMT first witnessed efforts to locate patient. Verbalized to Triage RN unable to find pt.

## 2015-11-22 NOTE — Discharge Instructions (Signed)
Please call the eye clinic listed below first thing Monday morning to schedule your follow up visit. Slight them know you were evaluated in the ER and informed to follow-up on Monday if at all possible. Bring these papers to your follow-up appointment with the eye doctor. Please let them know that your intraocular pressure was checked - these values were 18 in the right eye and 21 in the left eye. Return to ER for new or worsening symptoms, any additional concerns.   [facility] [address] [place], [place] [address] Local: [phone] Toll-free: [phone]

## 2015-11-22 NOTE — ED Triage Notes (Addendum)
Pt c/o was told by someone in [facility] that he has "cadillacs" to eyes bilaterally, worried, unsure what to do. Vision has become progressively cloudy - "like snow" - onset 3 weeks ago. Bilateral periorbital pain. Red reflex intact, slightly pale, bilaterally. No pain with accomodation or EOMs.

## 2015-12-15 ENCOUNTER — Other Ambulatory Visit: Payer: Self-pay | Admitting: Family Medicine

## 2016-01-10 ENCOUNTER — Encounter (HOSPITAL_COMMUNITY): Payer: Self-pay | Admitting: Emergency Medicine

## 2016-01-10 ENCOUNTER — Emergency Department (HOSPITAL_COMMUNITY): Payer: Medicaid Other

## 2016-01-10 ENCOUNTER — Observation Stay (HOSPITAL_COMMUNITY)
Admission: EM | Admit: 2016-01-10 | Discharge: 2016-01-11 | Disposition: A | Discharge status: Home / Self Care | Payer: Medicaid Other | Attending: Internal Medicine | Admitting: Internal Medicine

## 2016-01-10 DIAGNOSIS — I10 Essential (primary) hypertension: Secondary | ICD-10-CM | POA: Diagnosis not present

## 2016-01-10 DIAGNOSIS — Z7982 Long term (current) use of aspirin: Secondary | ICD-10-CM | POA: Insufficient documentation

## 2016-01-10 DIAGNOSIS — R072 Precordial pain: Secondary | ICD-10-CM | POA: Diagnosis present

## 2016-01-10 DIAGNOSIS — I509 Heart failure, unspecified: Secondary | ICD-10-CM | POA: Diagnosis not present

## 2016-01-10 DIAGNOSIS — I130 Hypertensive heart and chronic kidney disease with heart failure and stage 1 through stage 4 chronic kidney disease, or unspecified chronic kidney disease: Secondary | ICD-10-CM | POA: Insufficient documentation

## 2016-01-10 DIAGNOSIS — I2510 Atherosclerotic heart disease of native coronary artery without angina pectoris: Secondary | ICD-10-CM | POA: Insufficient documentation

## 2016-01-10 DIAGNOSIS — N184 Chronic kidney disease, stage 4 (severe): Secondary | ICD-10-CM | POA: Diagnosis not present

## 2016-01-10 DIAGNOSIS — Z955 Presence of coronary angioplasty implant and graft: Secondary | ICD-10-CM | POA: Insufficient documentation

## 2016-01-10 DIAGNOSIS — Z87891 Personal history of nicotine dependence: Secondary | ICD-10-CM | POA: Diagnosis not present

## 2016-01-10 DIAGNOSIS — E1122 Type 2 diabetes mellitus with diabetic chronic kidney disease: Secondary | ICD-10-CM | POA: Insufficient documentation

## 2016-01-10 DIAGNOSIS — R079 Chest pain, unspecified: Secondary | ICD-10-CM

## 2016-01-10 DIAGNOSIS — I252 Old myocardial infarction: Secondary | ICD-10-CM | POA: Diagnosis not present

## 2016-01-10 DIAGNOSIS — Z79899 Other long term (current) drug therapy: Secondary | ICD-10-CM | POA: Diagnosis not present

## 2016-01-10 LAB — COMPREHENSIVE METABOLIC PANEL
ALK PHOS: 55 U/L (ref 38–126)
ALT: 19 U/L (ref 17–63)
ANION GAP: 8 (ref 5–15)
AST: 24 U/L (ref 15–41)
Albumin: 3.1 g/dL — ABNORMAL LOW (ref 3.5–5.0)
BILIRUBIN TOTAL: 0.4 mg/dL (ref 0.3–1.2)
BUN: 32 mg/dL — ABNORMAL HIGH (ref 6–20)
CALCIUM: 8.5 mg/dL — AB (ref 8.9–10.3)
CO2: 23 mmol/L (ref 22–32)
Chloride: 104 mmol/L (ref 101–111)
Creatinine, Ser: 2.84 mg/dL — ABNORMAL HIGH (ref 0.61–1.24)
GFR calc non Af Amer: 22 mL/min — ABNORMAL LOW (ref >60)
GFR, EST AFRICAN AMERICAN: 26 mL/min — AB (ref >60)
Glucose, Bld: 111 mg/dL — ABNORMAL HIGH (ref 65–99)
Potassium: 3.2 mmol/L — ABNORMAL LOW (ref 3.5–5.1)
Sodium: 135 mmol/L (ref 135–145)
TOTAL PROTEIN: 6.4 g/dL — AB (ref 6.5–8.1)

## 2016-01-10 LAB — CBC WITH DIFFERENTIAL/PLATELET
Basophils Absolute: 0.1 10*3/uL (ref 0.0–0.1)
Basophils Relative: 1 %
Eosinophils Absolute: 0.3 10*3/uL (ref 0.0–0.7)
Eosinophils Relative: 6 %
HEMATOCRIT: 35.5 % — AB (ref 39.0–52.0)
HEMOGLOBIN: 11.8 g/dL — AB (ref 13.0–17.0)
LYMPHS ABS: 3 10*3/uL (ref 0.7–4.0)
LYMPHS PCT: 52 %
MCH: 28.6 pg (ref 26.0–34.0)
MCHC: 33.2 g/dL (ref 30.0–36.0)
MCV: 86 fL (ref 78.0–100.0)
MONOS PCT: 12 %
Monocytes Absolute: 0.7 10*3/uL (ref 0.1–1.0)
NEUTROS PCT: 29 %
Neutro Abs: 1.7 10*3/uL (ref 1.7–7.7)
Platelets: 229 10*3/uL (ref 150–400)
RBC: 4.13 MIL/uL — AB (ref 4.22–5.81)
RDW: 13.9 % (ref 11.5–15.5)
WBC: 5.7 10*3/uL (ref 4.0–10.5)

## 2016-01-10 LAB — I-STAT TROPONIN, ED: TROPONIN I, POC: 0 ng/mL (ref 0.00–0.08)

## 2016-01-10 LAB — BRAIN NATRIURETIC PEPTIDE: B Natriuretic Peptide: 17.1 pg/mL (ref 0.0–100.0)

## 2016-01-10 MED ORDER — ASPIRIN 81 MG PO CHEW: 324.0000 mg | CHEWABLE_TABLET | Freq: Once | ORAL | Status: DC

## 2016-01-10 MED ORDER — CITALOPRAM HYDROBROMIDE 20 MG PO TABS
40.0000 mg | ORAL_TABLET | Freq: Every day | ORAL | Status: DC
  Administered 2016-01-11: 40 mg via ORAL
  Filled 2016-01-10: qty 2

## 2016-01-10 MED ORDER — CLONIDINE HCL 0.2 MG PO TABS
0.2000 mg | ORAL_TABLET | Freq: Two times a day (BID) | ORAL | Status: DC
  Administered 2016-01-11: 0.2 mg via ORAL
  Filled 2016-01-10: qty 1

## 2016-01-10 MED ORDER — ONDANSETRON HCL 4 MG/2ML IJ SOLN: 4.0000 mg | Freq: Four times a day (QID) | INTRAMUSCULAR | Status: DC | PRN

## 2016-01-10 MED ORDER — NITROGLYCERIN 0.4 MG SL SUBL: 0.4000 mg | SUBLINGUAL_TABLET | SUBLINGUAL | Status: DC | PRN

## 2016-01-10 MED ORDER — ENOXAPARIN SODIUM 40 MG/0.4ML ~~LOC~~ SOLN
40.0000 mg | Freq: Every day | SUBCUTANEOUS | Status: DC
  Administered 2016-01-11: 40 mg via SUBCUTANEOUS
  Filled 2016-01-10: qty 0.4

## 2016-01-10 MED ORDER — ISOSORBIDE MONONITRATE 15 MG HALF TABLET
15.0000 mg | ORAL_TABLET | Freq: Every day | ORAL | Status: DC
  Filled 2016-01-10: qty 1

## 2016-01-10 MED ORDER — ASPIRIN EC 81 MG PO TBEC
81.0000 mg | DELAYED_RELEASE_TABLET | Freq: Every day | ORAL | Status: DC
  Administered 2016-01-11: 81 mg via ORAL
  Filled 2016-01-10: qty 1

## 2016-01-10 MED ORDER — METOPROLOL TARTRATE 50 MG PO TABS
50.0000 mg | ORAL_TABLET | Freq: Two times a day (BID) | ORAL | Status: DC
  Administered 2016-01-11: 50 mg via ORAL
  Filled 2016-01-10: qty 1

## 2016-01-10 MED ORDER — AMLODIPINE BESYLATE 10 MG PO TABS
10.0000 mg | ORAL_TABLET | Freq: Every day | ORAL | Status: DC
  Administered 2016-01-11: 10 mg via ORAL
  Filled 2016-01-10: qty 1

## 2016-01-10 MED ORDER — ACETAMINOPHEN 325 MG PO TABS: 650.0000 mg | ORAL_TABLET | ORAL | Status: DC | PRN

## 2016-01-10 MED ORDER — ATORVASTATIN CALCIUM 40 MG PO TABS: 40.0000 mg | ORAL_TABLET | Freq: Every day | ORAL | Status: DC

## 2016-01-10 NOTE — ED Provider Notes (Signed)
[facility]-EMERGENCY DEPT Provider Note   CSN: [id number] Arrival date & time: 01/10/16  2057     History   Chief Complaint Chief Complaint  Patient presents with  . Chest Pain    HPI Richard Barnett is a 60 y.o. Richard Barnett.  HPI 60 year old [name] with past medical history of coronary artery disease, DVT, CHF, who presents with chest pain. The patient states he was sitting on his couch earlier today when he developed acute onset of aching, substernal chest pressure with associated shortness of breath. He took multiple doses of nitroglycerin with relief of his chest pain. He has a history of coronary disease and states it felt similar to this. He has been compliant with his aspirin therapy. Denies any lower extremity swelling. Denies any pleuritic component. Pain is made worse with exertion and resolves with rest as well as nitroglycerin.  Past Medical History:  Diagnosis Date  . Anginal pain (HCC)   . Arthritis    "right leg" (08/22/2014)  . CHF (congestive heart failure) (HCC)   . Chronic kidney disease    "from my blood pressure problems" (08/22/2014)  . Chronic renal insufficiency    Richard Barnett 08/22/2014  . Daily headache   . Depression    "I always get that" (08/22/2014)  . Diabetes mellitus without complication (HCC)   . DVT (deep venous thrombosis) (HCC) ?   RLE  . Heart murmur   . Hypercholesterolemia   . Hypertension   . MI (myocardial infarction)    "I've had couple when I was asleep; a couple when I was awake" (08/22/2014)    Patient Active Problem List   Diagnosis Date Noted  . Anemia of chronic renal failure   . Secondary hyperparathyroidism of renal origin (HCC)   . Proteinuria   . Arthritis   . Depression   . Deep vein thrombosis (HCC)   . Myocardial infarction   . Heart failure, unspecified   . Heart murmur   . Prediabetes 02/22/2015  . Chronic kidney disease, stage IV (severe) (HCC)   . Chest pain with high risk for cardiac etiology   . Hyperlipidemia 05/22/2014  .  Chest pain 05/21/2014  . Hypokalemia 05/21/2014  . Chronic kidney disease 05/21/2014  . CAD (coronary artery disease) 05/21/2014  . Hypertension 05/21/2014    Past Surgical History:  Procedure Laterality Date  . CORONARY ANGIOPLASTY WITH STENT PLACEMENT     "1 + 1"       Home Medications    Prior to Admission medications   Medication Sig Start Date End Date Taking? Authorizing Provider  amLODipine (NORVASC) 10 MG tablet TAKE ONE (1) TABLET BY MOUTH EVERY DAY 11/17/15  Yes Richard Hoover, NP  aspirin 81 MG EC tablet Take 1 tablet by mouth daily 08/25/15  Yes Richard Maroon, FNP  atorvastatin (LIPITOR) 40 MG tablet TAKE ONE TABLET BY MOUTH EVERY EVENING AT SIX P.M. 11/17/15  Yes Richard Hoover, NP  cloNIDine (CATAPRES) 0.2 MG tablet TAKE ONE (1) TABLET BY MOUTH TWO (2) TIMES DAILY 11/17/15  Yes Richard Hoover, NP  isosorbide mononitrate (IMDUR) 30 MG 24 hr tablet TAKE 1/2 TABLET BY MOUTH DAILY 11/17/15  Yes Richard Hoover, NP  metoprolol (LOPRESSOR) 50 MG tablet TAKE ONE (1) TABLET BY MOUTH TWO (2) TIMES DAILY 11/17/15  Yes Richard Hoover, NP  nitroGLYCERIN (NITROSTAT) 0.4 MG SL tablet DISSOLVE 1 TABLET UNDER THE TONGUE AS NEEDED FOR CHEST PAIN. REPEAT AS NEEDED EVERY 5 MINUTES UP TO A TOTAL  OF 3 DOSES 12/15/15  Yes Richard HooverLinda C Bernhardt, NP  citalopram (CELEXA) 40 MG tablet Take 40 mg by mouth daily.    Historical Provider, MD    Family History Family History  Problem Relation Age of Onset  . Hypertension Mother   . Kidney disease Mother   . Heart disease Mother   . Heart disease Father   . Hypertension Father     Social History Social History  Substance Use Topics  . Smoking status: Former Smoker    Packs/day: 0.00    Years: 40.00    Types: Cigarettes    Quit date: 08/29/2010  . Smokeless tobacco: Never Used     Comment: "quit smoking cigarettes in ~ 2011"  . Alcohol use No     Comment: 08/22/2014 "stopped in ~ 2011"     Allergies   Penicillins   Review  of Systems Review of Systems  Constitutional: Negative for chills, fatigue and fever.  HENT: Negative for congestion and rhinorrhea.   Eyes: Negative for visual disturbance.  Respiratory: Positive for chest tightness and shortness of breath. Negative for cough and wheezing.   Cardiovascular: Positive for chest pain. Negative for leg swelling.  Gastrointestinal: Negative for abdominal pain, diarrhea, nausea and vomiting.  Genitourinary: Negative for dysuria and flank pain.  Musculoskeletal: Negative for neck pain and neck stiffness.  Skin: Negative for rash and wound.  Allergic/Immunologic: Negative for immunocompromised state.  Neurological: Negative for syncope, weakness and headaches.  All other systems reviewed and are negative.    Physical Exam Updated Vital Signs BP 154/91 (BP Location: Right Arm)   Pulse 61   Temp 97.8 F (36.6 C) (Oral)   Resp 16   SpO2 100%   Physical Exam  Constitutional: He is oriented to person, place, and time. He appears well-developed and well-nourished. No distress.  HENT:  Head: Normocephalic and atraumatic.  Eyes: Conjunctivae are normal. Pupils are equal, round, and reactive to light.  Neck: Neck supple.  Cardiovascular: Normal rate, regular rhythm and normal heart sounds.  Exam reveals no friction rub.   No murmur heard. Pulmonary/Chest: Effort normal and breath sounds normal. No respiratory distress. He has no wheezes. He has no rales.  Abdominal: Soft. Bowel sounds are normal. He exhibits no distension. There is no tenderness.  Musculoskeletal: He exhibits no edema.  Neurological: He is alert and oriented to person, place, and time. He exhibits normal muscle tone.  Skin: Skin is warm. Capillary refill takes less than 2 seconds.  Psychiatric: He has a normal mood and affect.  Nursing note and vitals reviewed.    ED Treatments / Results  Labs (all labs ordered are listed, but only abnormal results are displayed) Labs Reviewed  CBC  WITH DIFFERENTIAL/PLATELET - Abnormal; Notable for the following:       Result Value   RBC 4.13 (*)    Hemoglobin 11.8 (*)    HCT 35.5 (*)    All other components within normal limits  COMPREHENSIVE METABOLIC PANEL - Abnormal; Notable for the following:    Potassium 3.2 (*)    Glucose, Bld 111 (*)    BUN 32 (*)    Creatinine, Ser 2.84 (*)    Calcium 8.5 (*)    Total Protein 6.4 (*)    Albumin 3.1 (*)    GFR calc non Af Amer 22 (*)    GFR calc Af Amer 26 (*)    All other components within normal limits  BRAIN NATRIURETIC PEPTIDE  TROPONIN I  TROPONIN I  TROPONIN I  Rosezena Sensor, ED    EKG  EKG Interpretation  Date/Time:  Saturday January 10 2016 21:13:55 EDT Ventricular Rate:  63 PR Interval:  166 QRS Duration: 92 QT Interval:  410 QTC Calculation: 419 R Axis:   1 Text Interpretation:  Normal sinus rhythm Moderate voltage criteria for LVH, may be normal variant T wave abnormality, consider inferolateral ischemia Abnormal ECG No significant change since last tracing Confirmed by Shemicka Cohrs MD, Sheria Lang [phone]) on 01/10/2016 9:50:51 PM       Radiology Dg Chest 2 View  Result Date: 01/10/2016 CLINICAL DATA:  Acute onset of right-sided chest pressure and shortness of breath. Initial encounter. EXAM: CHEST  2 VIEW COMPARISON:  Chest radiograph performed 10/03/2015 FINDINGS: The lungs are well-aerated and clear. There is no evidence of focal opacification, pleural effusion or pneumothorax. The heart is borderline normal in size. No acute osseous abnormalities are seen. IMPRESSION: No acute cardiopulmonary process seen. Electronically Signed   By: Roanna Raider M.D.   On: 01/10/2016 21:54    Procedures Procedures (including critical care time)  Medications Ordered in ED Medications  aspirin chewable tablet 324 mg (324 mg Oral Not Given 01/10/16 2204)  nitroGLYCERIN (NITROSTAT) SL tablet 0.4 mg (not administered)  citalopram (CELEXA) tablet 40 mg (not administered)  aspirin  EC tablet 81 mg (not administered)  isosorbide mononitrate (IMDUR) 24 hr tablet 15 mg (not administered)  amLODipine (NORVASC) tablet 10 mg (not administered)  atorvastatin (LIPITOR) tablet 40 mg (not administered)  cloNIDine (CATAPRES) tablet 0.2 mg (not administered)  metoprolol tartrate (LOPRESSOR) tablet 50 mg (not administered)  acetaminophen (TYLENOL) tablet 650 mg (not administered)  ondansetron (ZOFRAN) injection 4 mg (not administered)  enoxaparin (LOVENOX) injection 40 mg (not administered)     Initial Impression / Assessment and Plan / ED Course  I have reviewed the triage vital signs and the nursing notes.  Pertinent labs & imaging results that were available during my care of the patient were reviewed by me and considered in my medical decision making (see chart for details).  Clinical Course    60 year old Davi with past medical history as above, including coronary artery disease, who presents with aching, substernal chest pressure. On arrival, chest pain resolved after nitroglycerin. EKG shows persistent, old diffuse T-wave inversions with no ST elevations. Initial lab work is overall reassuring and at baseline. CK D is at baseline. I discussed the case with cardiology, who recommends hospitalist admission with provocative stress testing. They will consult as needed. Discussed with Dr. Julian Reil. Will admit for observation telemetry and provocative stress testing.  Final Clinical Impressions(s) / ED Diagnoses   Final diagnoses:  Chest pain, unspecified type      Shaune Pollack, MD 01/10/16 2348

## 2016-01-10 NOTE — ED Triage Notes (Signed)
Pt states that at approx 2000 he has substernal crushing chest pain 10-10. He took 324 ASA at home and called EMS. Per EMS he had 2 nitro enroute. Pt has a hx of MI, Stents, high cholesterol, and HTN

## 2016-01-10 NOTE — ED Notes (Addendum)
Pt ambulated to bathroom. Food at bedside.

## 2016-01-10 NOTE — H&P (Signed)
History and Physical    Richard Barnett LevelsFrazier Barnett:[id number] DOB: 1955/11/19 DOA: 01/10/2016   PCP: Massie MaroonHollis,Richard M, FNP Chief Complaint:  Chief Complaint  Patient presents with  . Chest Pain    HPI: Richard Barnett LevelsFrazier is a 60 y.o. [place] with medical history significant of CAD s/p MI and stent in past, HTN, CKD stage 4 follows with [place].  Patient presents to the ED with c/o crushing chest pain, severe in intensity, located in L chest.  Took ASA 324, called EMS.  2x NTG en route improved / resolved his CP.  ED Course: Trop negative.  EKG looks unchanged from July but his July EKG and this EKG have significant ST depressions and T wave inversions that are more pronounced than March and Jan of this year.  Review of Systems: As per HPI otherwise 10 point review of systems negative.    Past Medical History:  Diagnosis Date  . Anginal pain (HCC)   . Arthritis    "right leg" (08/22/2014)  . CHF (congestive heart failure) (HCC)   . Chronic kidney disease    "from my blood pressure problems" (08/22/2014)  . Chronic renal insufficiency    Richard Barnett/notes 08/22/2014  . Daily headache   . Depression    "I always get that" (08/22/2014)  . Diabetes mellitus without complication (HCC)   . DVT (deep venous thrombosis) (HCC) ?   RLE  . Heart murmur   . Hypercholesterolemia   . Hypertension   . MI (myocardial infarction)    "I've had couple when I was asleep; a couple when I was awake" (08/22/2014)    Past Surgical History:  Procedure Laterality Date  . CORONARY ANGIOPLASTY WITH STENT PLACEMENT     "1 + 1"     reports that he quit smoking about 5 years ago. His smoking use included Cigarettes. He smoked 0.00 packs per day for 40.00 years. He has never used smokeless tobacco. He reports that he does not drink alcohol or use drugs.  Allergies  Allergen Reactions  . Penicillins Swelling and Rash    Swelling of whole body  Has patient had a PCN reaction causing immediate rash, facial/tongue/throat  swelling, SOB or lightheadedness with hypotension: No Has patient had a PCN reaction causing severe rash involving mucus membranes or skin necrosis: No Has patient had a PCN reaction that required hospitalization No Has patient had a PCN reaction occurring within the last 10 years: No If all of the above answers are "NO", then may proceed with Cephalosporin use.     Family History  Problem Relation Age of Onset  . Hypertension Mother   . Kidney disease Mother   . Heart disease Mother   . Heart disease Father   . Hypertension Father       Prior to Admission medications   Medication Sig Start Date End Date Taking? Authorizing Provider  amLODipine (NORVASC) 10 MG tablet TAKE ONE (1) TABLET BY MOUTH EVERY DAY 11/17/15  Yes Henrietta HooverLinda C Bernhardt, NP  aspirin 81 MG EC tablet Take 1 tablet by mouth daily 08/25/15  Yes Massie MaroonLachina M [name], FNP  atorvastatin (LIPITOR) 40 MG tablet TAKE ONE TABLET BY MOUTH EVERY EVENING AT SIX P.M. 11/17/15  Yes Henrietta HooverLinda C Bernhardt, NP  cloNIDine (CATAPRES) 0.2 MG tablet TAKE ONE (1) TABLET BY MOUTH TWO (2) TIMES DAILY 11/17/15  Yes Henrietta HooverLinda C Bernhardt, NP  isosorbide mononitrate (IMDUR) 30 MG 24 hr tablet TAKE 1/2 TABLET BY MOUTH DAILY 11/17/15  Yes Henrietta HooverLinda C Bernhardt, NP  metoprolol (LOPRESSOR) 50 MG tablet TAKE ONE (1) TABLET BY MOUTH TWO (2) TIMES DAILY 11/17/15  Yes Henrietta Hoover, NP  nitroGLYCERIN (NITROSTAT) 0.4 MG SL tablet DISSOLVE 1 TABLET UNDER THE TONGUE AS NEEDED FOR CHEST PAIN. REPEAT AS NEEDED EVERY 5 MINUTES UP TO A TOTAL OF 3 DOSES 12/15/15  Yes Henrietta Hoover, NP  citalopram (CELEXA) 40 MG tablet Take 40 mg by mouth daily.    Historical Provider, MD    Physical Exam: Vitals:   01/10/16 2057 01/10/16 2102  BP:  132/82  Pulse:  67  Resp:  18  Temp:  97.8 F (36.6 C)  TempSrc:  Oral  SpO2: 99% 100%      Constitutional: NAD, calm, comfortable Eyes: PERRL, lids and conjunctivae normal ENMT: Mucous membranes are moist. Posterior pharynx clear of  any exudate or lesions.Normal dentition.  Neck: normal, supple, no masses, no thyromegaly Respiratory: clear to auscultation bilaterally, no wheezing, no crackles. Normal respiratory effort. No accessory muscle use.  Cardiovascular: Regular rate and rhythm, no murmurs / rubs / gallops. No extremity edema. 2+ pedal pulses. No carotid bruits.  Abdomen: no tenderness, no masses palpated. No hepatosplenomegaly. Bowel sounds positive.  Musculoskeletal: no clubbing / cyanosis. No joint deformity upper and lower extremities. Good ROM, no contractures. Normal muscle tone.  Skin: no rashes, lesions, ulcers. No induration Neurologic: CN 2-12 grossly intact. Sensation intact, DTR normal. Strength 5/5 in all 4.  Psychiatric: Normal judgment and insight. Alert and oriented x 3. Normal mood.    Labs on Admission: I have personally reviewed following labs and imaging studies  CBC:  Recent Labs Lab 01/10/16 2124  WBC 5.7  NEUTROABS 1.7  HGB 11.8*  HCT 35.5*  MCV 86.0  PLT 229   Basic Metabolic Panel:  Recent Labs Lab 01/10/16 2124  NA 135  K 3.2*  CL 104  CO2 23  GLUCOSE 111*  BUN 32*  CREATININE 2.84*  CALCIUM 8.5*   GFR: CrCl cannot be calculated (Unknown ideal weight.). Liver Function Tests:  Recent Labs Lab 01/10/16 2124  AST 24  ALT 19  ALKPHOS 55  BILITOT 0.4  PROT 6.4*  ALBUMIN 3.1*   No results for input(s): LIPASE, AMYLASE in the last 168 hours. No results for input(s): AMMONIA in the last 168 hours. Coagulation Profile: No results for input(s): INR, PROTIME in the last 168 hours. Cardiac Enzymes: No results for input(s): CKTOTAL, CKMB, CKMBINDEX, TROPONINI in the last 168 hours. BNP (last 3 results) No results for input(s): PROBNP in the last 8760 hours. HbA1C: No results for input(s): HGBA1C in the last 72 hours. CBG: No results for input(s): GLUCAP in the last 168 hours. Lipid Profile: No results for input(s): CHOL, HDL, LDLCALC, TRIG, CHOLHDL, LDLDIRECT  in the last 72 hours. Thyroid Function Tests: No results for input(s): TSH, T4TOTAL, FREET4, T3FREE, THYROIDAB in the last 72 hours. Anemia Panel: No results for input(s): VITAMINB12, FOLATE, FERRITIN, TIBC, IRON, RETICCTPCT in the last 72 hours. Urine analysis:    Component Value Date/Time   COLORURINE YELLOW 04/01/2015 1708   APPEARANCEUR CLOUDY (A) 04/01/2015 1708   LABSPEC 1.026 04/01/2015 1708   PHURINE 5.0 04/01/2015 1708   GLUCOSEU NEGATIVE 04/01/2015 1708   HGBUR SMALL (A) 04/01/2015 1708   BILIRUBINUR SMALL (A) 04/01/2015 1708   KETONESUR NEGATIVE 04/01/2015 1708   PROTEINUR >300 (A) 04/01/2015 1708   UROBILINOGEN 0.2 02/21/2015 0955   NITRITE NEGATIVE 04/01/2015 1708   LEUKOCYTESUR NEGATIVE 04/01/2015 1708   Sepsis Labs: @LABRCNTIP (procalcitonin:4,lacticidven:4) )  No results found for this or any previous visit (from the past 240 hour(s)).   Radiological Exams on Admission: Dg Chest 2 View  Result Date: 01/10/2016 CLINICAL DATA:  Acute onset of right-sided chest pressure and shortness of breath. Initial encounter. EXAM: CHEST  2 VIEW COMPARISON:  Chest radiograph performed 10/03/2015 FINDINGS: The lungs are well-aerated and clear. There is no evidence of focal opacification, pleural effusion or pneumothorax. The heart is borderline normal in size. No acute osseous abnormalities are seen. IMPRESSION: No acute cardiopulmonary process seen. Electronically Signed   By: Roanna Raider M.D.   On: 01/10/2016 21:54    EKG: Independently reviewed.  Assessment/Plan Principal Problem:   Chest pain with high risk for cardiac etiology Active Problems:   CAD (coronary artery disease)   Hypertension   Chronic kidney disease, stage IV (severe) (HCC)    1. Chest pain high risk - 1. CP obs pathway 2. Serial trops 3. Tele monitor 4. If CP returns try NTG paste 5. Call cards back in AM 2. CKD stage 4 - chronic, severe, and creatinine is baseline 3. HTN - continue home  meds   DVT prophylaxis: Lovenox Code Status: Full Family Communication: Wife at bedside Consults called: Cards called by EDP Admission status: Admit to obs   Cloud Graham, Heywood Iles DO [facility] Pager [phone] from 7PM-7AM  If 7AM-7PM, please contact the day physician for the patient [email or web address] Password [email or web address]  01/10/2016, 11:19 PM

## 2016-01-11 ENCOUNTER — Observation Stay (HOSPITAL_COMMUNITY): Payer: Medicaid Other

## 2016-01-11 ENCOUNTER — Observation Stay (HOSPITAL_BASED_OUTPATIENT_CLINIC_OR_DEPARTMENT_OTHER): Payer: Medicaid Other

## 2016-01-11 DIAGNOSIS — R0789 Other chest pain: Secondary | ICD-10-CM | POA: Diagnosis not present

## 2016-01-11 DIAGNOSIS — I2583 Coronary atherosclerosis due to lipid rich plaque: Secondary | ICD-10-CM

## 2016-01-11 DIAGNOSIS — N184 Chronic kidney disease, stage 4 (severe): Secondary | ICD-10-CM

## 2016-01-11 DIAGNOSIS — I2510 Atherosclerotic heart disease of native coronary artery without angina pectoris: Secondary | ICD-10-CM

## 2016-01-11 DIAGNOSIS — I10 Essential (primary) hypertension: Secondary | ICD-10-CM | POA: Diagnosis not present

## 2016-01-11 DIAGNOSIS — E785 Hyperlipidemia, unspecified: Secondary | ICD-10-CM

## 2016-01-11 DIAGNOSIS — R079 Chest pain, unspecified: Secondary | ICD-10-CM

## 2016-01-11 LAB — TROPONIN I
Troponin I: <0.03 ng/mL (ref <0.03)
Troponin I: <0.03 ng/mL (ref <0.03)

## 2016-01-11 LAB — LIPID PANEL
Cholesterol: 137 mg/dL (ref 0–200)
HDL: 34 mg/dL — ABNORMAL LOW (ref >40)
LDL CALC: 79 mg/dL (ref 0–99)
TRIGLYCERIDES: 118 mg/dL (ref <150)
Total CHOL/HDL Ratio: 4 RATIO
VLDL: 24 mg/dL (ref 0–40)

## 2016-01-11 LAB — NM MYOCAR MULTI W/SPECT W/WALL MOTION / EF
CHL CUP RESTING HR STRESS: 64 {beats}/min
CSEPEDS: 0 s
CSEPHR: 49 %
CSEPPHR: 159 {beats}/min
Estimated workload: 1 METS
Exercise duration (min): 0 min
MPHR: 159 {beats}/min

## 2016-01-11 LAB — MAGNESIUM: Magnesium: 2 mg/dL (ref 1.7–2.4)

## 2016-01-11 LAB — GLUCOSE, CAPILLARY: Glucose-Capillary: 171 mg/dL — ABNORMAL HIGH (ref 65–99)

## 2016-01-11 MED ORDER — ISOSORBIDE MONONITRATE ER 30 MG PO TB24: 30.0000 mg | ORAL_TABLET | Freq: Every day | ORAL | 0 refills | Status: DC

## 2016-01-11 MED ORDER — REGADENOSON 0.4 MG/5ML IV SOLN
INTRAVENOUS | Status: AC
  Filled 2016-01-11: qty 5

## 2016-01-11 MED ORDER — PANTOPRAZOLE SODIUM 40 MG PO TBEC
40.0000 mg | DELAYED_RELEASE_TABLET | Freq: Every day | ORAL | Status: DC
  Administered 2016-01-11: 40 mg via ORAL
  Filled 2016-01-11: qty 1

## 2016-01-11 MED ORDER — TECHNETIUM TC 99M TETROFOSMIN IV KIT
10.0000 | PACK | Freq: Once | INTRAVENOUS | Status: AC | PRN
  Administered 2016-01-11: 10 via INTRAVENOUS

## 2016-01-11 MED ORDER — ISOSORBIDE MONONITRATE ER 30 MG PO TB24
30.0000 mg | ORAL_TABLET | Freq: Every day | ORAL | Status: DC
  Administered 2016-01-11: 30 mg via ORAL
  Filled 2016-01-11: qty 1

## 2016-01-11 MED ORDER — REGADENOSON 0.4 MG/5ML IV SOLN
0.4000 mg | Freq: Once | INTRAVENOUS | Status: AC
  Administered 2016-01-11: 0.4 mg via INTRAVENOUS

## 2016-01-11 MED ORDER — POTASSIUM CHLORIDE CRYS ER 20 MEQ PO TBCR
40.0000 meq | EXTENDED_RELEASE_TABLET | Freq: Once | ORAL | Status: AC
  Administered 2016-01-11: 40 meq via ORAL
  Filled 2016-01-11: qty 2

## 2016-01-11 MED ORDER — TECHNETIUM TC 99M TETROFOSMIN IV KIT
30.0000 | PACK | Freq: Once | INTRAVENOUS | Status: AC | PRN
  Administered 2016-01-11: 30 via INTRAVENOUS

## 2016-01-11 NOTE — Progress Notes (Signed)
PROGRESS NOTE    Richard Barnett  EZV:[id number] DOB: 1955/09/03 DOA: 01/10/2016 PCP: Massie Maroon, FNP   Chief Complaint  Patient presents with  . Chest Pain    Brief Narrative:  HPI on 01/10/2016 by Dr. Lyda Perone Richard Barnett is a 60 y.o. [name] with medical history significant of CAD s/p MI and stent in past, HTN, CKD stage 4 follows with [facility].  Patient presents to the ED with c/o crushing chest pain, severe in intensity, located in L chest.  Took ASA 324, called EMS.  2x NTG en route improved / resolved his CP. Assessment & Plan   Chest pain with history of CAD -patient is high risk  -EKG showed no acute ischemic changes (chronic TWI in inferolateral leads) -Trop negative -Pending exercise myoview today (holding off on cath due to CKD,stage IV) -Patient had 2 stents placed in [place] in 2013 -Continue aspirin, statin, metoprolol -No ACEi or ARB due to CKD,stage IV  Chronic kidney disease, stage IV -Creatinine currently 2.84 -Baseline around 2.4 -GFR still in stage IV -Continue to monitor BMP  Essential Hypertension -Continue metoprolol, amlodipine, clonidine, imdur  Hyperlipidemia -continue statin  Depression -Continue celexa  Hyperglycemia -Will obtain Hemoglobin A1c  DVT Prophylaxis  Lovenox  Code Status: Full  Family Communication: None at bedside  Disposition Plan: Observation. Pending results of myoview stress. Home when medically stable.   Consultants Cardiology  Procedures  Lexiscan  Antibiotics   Anti-infectives    None      Subjective:   Richard Barnett seen and examined today.  Patient currently denies chest pain.  Feels tired. Denies shortness of breath, abdominal pain, N/V/D/C, dizziness or headache.  Worried about having diabetes and does not want to take insulin, he is afraid of needles.   Objective:   Vitals:   01/11/16 0958 01/11/16 0959 01/11/16 1001 01/11/16 1229  BP: (!) 145/94 (!) 141/92  (!) 153/82  Pulse: 66 77  75 61  Resp:      Temp:    97.9 F (36.6 C)  TempSrc:    Oral  SpO2:    97%  Weight:      Height:        Intake/Output Summary (Last 24 hours) at 01/11/16 1232 Last data filed at 01/11/16 0800  Gross per 24 hour  Intake                0 ml  Output              375 ml  Net             -375 ml   Filed Weights   01/11/16 0042 01/11/16 0510  Weight: 105.8 kg (233 lb 4.8 oz) 105.8 kg (233 lb 4.8 oz)    Exam  General: Well developed, well nourished, NAD, appears stated age  HEENT: NCAT,  mucous membranes moist.   Neck: Supple, no JVD, no masses  Cardiovascular: S1 S2 auscultated, no rubs, murmurs or gallops. Regular rate and rhythm.  Respiratory: Clear to auscultation bilaterally with equal chest rise  Abdomen: Soft, nontender, nondistended, + bowel sounds  Extremities: warm dry without cyanosis clubbing or edema  Neuro: AAOx3, nonfocal  Skin: Without rashes exudates or nodules  Psych: Normal affect and demeanor with intact judgement and insight   Data Reviewed: I have personally reviewed following labs and imaging studies  CBC:  Recent Labs Lab 01/10/16 2124  WBC 5.7  NEUTROABS 1.7  HGB 11.8*  HCT 35.5*  MCV 86.0  PLT 229   Basic Metabolic Panel:  Recent Labs Lab 01/10/16 2124 01/11/16 0829  NA 135  --   K 3.2*  --   CL 104  --   CO2 23  --   GLUCOSE 111*  --   BUN 32*  --   CREATININE 2.84*  --   CALCIUM 8.5*  --   MG  --  2.0   GFR: Estimated Creatinine Clearance: 31.7 mL/min (by C-G formula based on SCr of 2.84 mg/dL (H)). Liver Function Tests:  Recent Labs Lab 01/10/16 2124  AST 24  ALT 19  ALKPHOS 55  BILITOT 0.4  PROT 6.4*  ALBUMIN 3.1*   No results for input(s): LIPASE, AMYLASE in the last 168 hours. No results for input(s): AMMONIA in the last 168 hours. Coagulation Profile: No results for input(s): INR, PROTIME in the last 168 hours. Cardiac Enzymes:  Recent Labs Lab 01/11/16 0046 01/11/16 0332 01/11/16 0716    TROPONINI <0.03 <0.03 <0.03   BNP (last 3 results) No results for input(s): PROBNP in the last 8760 hours. HbA1C: No results for input(s): HGBA1C in the last 72 hours. CBG:  Recent Labs Lab 01/11/16 0037  GLUCAP 171*   Lipid Profile:  Recent Labs  01/11/16 0830  CHOL 137  HDL 34*  LDLCALC 79  TRIG [id number]  CHOLHDL 4.0   Thyroid Function Tests: No results for input(s): TSH, T4TOTAL, FREET4, T3FREE, THYROIDAB in the last 72 hours. Anemia Panel: No results for input(s): VITAMINB12, FOLATE, FERRITIN, TIBC, IRON, RETICCTPCT in the last 72 hours. Urine analysis:    Component Value Date/Time   COLORURINE YELLOW 04/01/2015 1708   APPEARANCEUR CLOUDY (A) 04/01/2015 1708   LABSPEC 1.026 04/01/2015 1708   PHURINE 5.0 04/01/2015 1708   GLUCOSEU NEGATIVE 04/01/2015 1708   HGBUR SMALL (A) 04/01/2015 1708   BILIRUBINUR SMALL (A) 04/01/2015 1708   KETONESUR NEGATIVE 04/01/2015 1708   PROTEINUR >300 (A) 04/01/2015 1708   UROBILINOGEN 0.2 02/21/2015 0955   NITRITE NEGATIVE 04/01/2015 1708   LEUKOCYTESUR NEGATIVE 04/01/2015 1708   Sepsis Labs: @LABRCNTIP (procalcitonin:4,lacticidven:4)  )No results found for this or any previous visit (from the past 240 hour(s)).    Radiology Studies: Dg Chest 2 View  Result Date: 01/10/2016 CLINICAL DATA:  Acute onset of right-sided chest pressure and shortness of breath. Initial encounter. EXAM: CHEST  2 VIEW COMPARISON:  Chest radiograph performed 10/03/2015 FINDINGS: The lungs are well-aerated and clear. There is no evidence of focal opacification, pleural effusion or pneumothorax. The heart is borderline normal in size. No acute osseous abnormalities are seen. IMPRESSION: No acute cardiopulmonary process seen. Electronically Signed   By: Roanna RaiderJeffery  Chang M.D.   On: 01/10/2016 21:54     Scheduled Meds: . amLODipine  10 mg Oral Daily  . aspirin  324 mg Oral Once  . aspirin EC  81 mg Oral Daily  . atorvastatin  40 mg Oral q1800  . citalopram   40 mg Oral Daily  . cloNIDine  0.2 mg Oral BID  . enoxaparin (LOVENOX) injection  40 mg Subcutaneous QHS  . isosorbide mononitrate  30 mg Oral Daily  . metoprolol  50 mg Oral BID  . pantoprazole  40 mg Oral Daily  . regadenoson       Continuous Infusions:   LOS: 0 days   Time Spent in minutes   30 minutes  Orma Cheetham D.O. on 01/11/2016 at 12:32 PM  Between 7am to 7pm - Pager - [phone]  After 7pm go to  [email or web address] - password [email or web address]  And look for the night coverage person covering for me after hours  [facility] Office  [phone]

## 2016-01-11 NOTE — Progress Notes (Addendum)
   Darroll Mangino presented for a exercise cardiolite today. Unable to achieve target HR and converted stress test to [facility]. No immediate complications.  Stress imaging is pending at this time.  Ellsworth Lennox, PA-C 01/11/2016, 9:28 AM    Stress Test showed:  IMPRESSION: 1. No reversible ischemia or infarction.  2. Normal left ventricular wall motion.  3. Left ventricular ejection fraction 52%  4. Non invasive risk stratification*: Low  Patient and admitting team made aware of results. Good for discharge from a cardiac perspective. Imdur dosing increased from 15mg  daily to 30mg  daily.   Signed, Ellsworth Lennox, PA-C 01/11/2016, 1:35 PM Pager: [phone]

## 2016-01-11 NOTE — Discharge Instructions (Signed)

## 2016-01-11 NOTE — Consult Note (Addendum)
CARDIOLOGY CONSULT NOTE   Patient ID: Richard Barnett MRN: [id number] DOB/AGE: 1956-02-14 60 y.o.  Admit date: 01/10/2016  Primary Physician   Richard Maroon, FNP Primary Cardiologist   Dr. Delton Barnett Reason for Consultation  Chest pain Requesting Physician  Dr. Catha Barnett  HPI: Richard Barnett is a 60 y.o. [place] with a history of CAD s/p 2 stent placement in [place], HTN, HLD, CKD stage IV (followed by [facility]), DM and DVT Presented for evaluation of chest pain.  He moved from [place] in early 2016 where he had his 2 stent placement in 2013. Initially seen by Dr. Delton Barnett during hospital admission 05/2014 for chest pain. Nuclear stress test was done on 05/24/14 which showed no reversible ischemia or infarction, ejection fraction 56%. Low risk stress test findings. Last seen by Dr. Anne Barnett  during chest pain consult 02/2015--> started on Imdur. No further intervention done. He never seen in office. He stopped smoking in 2009, family history of early CAD with mother heart attack 102, father 44, both deceased.  The patient was in good state of health up- ntil 6:30 PM yesterday when he had a upper sternal chest pain while watching TV. He describes the pain as a "dull status sensation". The pain was associated with shortness of breath without nausea, vomiting, radiation or diaphoresis. EMS was called and given sublingual nitroglycerin x 3 with minimal relief. Pain eventually subsided overnight. Currently chest pain-free. He was walking without angina and shortness of breath yesterday between [facility] and bus station. Admits to having intermittent dizziness and lower extremity edema. The patient denies orthopnea, PND, syncope, melena, abdominal tightness, blood in his stool or urine.  POC troponin negative. Troponin x 2 negative. BNP Normal. EKG shows normal sinus rhythm with T-wave inversion in inferior lateral lead which appears chronic. No acute changes. Potassium 3.2. Serum creatinine 2.84. Chest x-ray  without acute cardiopulmonary disease.  Past Medical History:  Diagnosis Date  . Anginal pain (HCC)   . Arthritis    "right leg" (08/22/2014)  . CHF (congestive heart failure) (HCC)   . Chronic kidney disease    "from my blood pressure problems" (08/22/2014)  . Chronic renal insufficiency    Richard Barnett 08/22/2014  . Daily headache   . Depression    "I always get that" (08/22/2014)  . Diabetes mellitus without complication (HCC)   . DVT (deep venous thrombosis) (HCC) ?   RLE  . Heart murmur   . Hypercholesterolemia   . Hypertension   . MI (myocardial infarction)    "I've had couple when I was asleep; a couple when I was awake" (08/22/2014)     Past Surgical History:  Procedure Laterality Date  . CORONARY ANGIOPLASTY WITH STENT PLACEMENT     "1 + 1"    Allergies  Allergen Reactions  . Penicillins Swelling and Rash    Swelling of whole body  Has patient had a PCN reaction causing immediate rash, facial/tongue/throat swelling, SOB or lightheadedness with hypotension: No Has patient had a PCN reaction causing severe rash involving mucus membranes or skin necrosis: No Has patient had a PCN reaction that required hospitalization No Has patient had a PCN reaction occurring within the last 10 years: No If all of the above answers are "NO", then may proceed with Cephalosporin use.     I have reviewed the patient's current medications . amLODipine  10 mg Oral Daily  . aspirin  324 mg Oral Once  . aspirin EC  81 mg Oral Daily  . atorvastatin  40 mg Oral q1800  . citalopram  40 mg Oral Daily  . cloNIDine  0.2 mg Oral BID  . enoxaparin (LOVENOX) injection  40 mg Subcutaneous QHS  . isosorbide mononitrate  15 mg Oral Daily  . metoprolol  50 mg Oral BID    acetaminophen, nitroGLYCERIN, ondansetron (ZOFRAN) IV  Prior to Admission medications   Medication Sig Start Date End Date Taking? Authorizing Provider  amLODipine (NORVASC) 10 MG tablet TAKE ONE (1) TABLET BY MOUTH EVERY DAY  11/17/15  Yes Richard Hoover, NP  aspirin 81 MG EC tablet Take 1 tablet by mouth daily 08/25/15  Yes Richard Maroon, FNP  atorvastatin (LIPITOR) 40 MG tablet TAKE ONE TABLET BY MOUTH EVERY EVENING AT SIX P.M. 11/17/15  Yes Richard Hoover, NP  cloNIDine (CATAPRES) 0.2 MG tablet TAKE ONE (1) TABLET BY MOUTH TWO (2) TIMES DAILY 11/17/15  Yes Richard Hoover, NP  isosorbide mononitrate (IMDUR) 30 MG 24 hr tablet TAKE 1/2 TABLET BY MOUTH DAILY 11/17/15  Yes Richard Hoover, NP  metoprolol (LOPRESSOR) 50 MG tablet TAKE ONE (1) TABLET BY MOUTH TWO (2) TIMES DAILY 11/17/15  Yes Richard Hoover, NP  nitroGLYCERIN (NITROSTAT) 0.4 MG SL tablet DISSOLVE 1 TABLET UNDER THE TONGUE AS NEEDED FOR CHEST PAIN. REPEAT AS NEEDED EVERY 5 MINUTES UP TO A TOTAL OF 3 DOSES 12/15/15  Yes Richard Hoover, NP  citalopram (CELEXA) 40 MG tablet Take 40 mg by mouth daily.    Historical Provider, MD     Social History   Social History  . Marital status: Divorced    Spouse name: N/A  . Number of children: N/A  . Years of education: N/A   Occupational History  . Not on file.   Social History Main Topics  . Smoking status: Former Smoker    Packs/day: 0.00    Years: 40.00    Types: Cigarettes    Quit date: 08/29/2010  . Smokeless tobacco: Never Used     Comment: "quit smoking cigarettes in ~ 2011"  . Alcohol use No     Comment: 08/22/2014 "stopped in ~ 2011"  . Drug use: No     Comment: 08/22/2014 "stopped in ~ 2011; all types of drugs""  . Sexual activity: No   Other Topics Concern  . Not on file   Social History Narrative  . No narrative on file    Family Status  Relation Status  . Mother Deceased  . Father Deceased   Family History  Problem Relation Age of Onset  . Hypertension Mother   . Kidney disease Mother   . Heart disease Mother   . Heart disease Father   . Hypertension Father       ROS:  Full 14 point review of systems complete and found to be negative unless listed  above.  Physical Exam: Blood pressure (!) 156/97, pulse 62, temperature 98.1 F (36.7 C), temperature source Oral, resp. rate 18, height 5\' 7"  (1.702 m), weight 233 lb 4.8 oz (105.8 kg), SpO2 99 %.  General: Well developed, well nourished, Jaymon in no acute distress Head: Eyes PERRLA, No xanthomas. Normocephalic and atraumatic, oropharynx without edema or exudate.  Lungs: Resp regular and unlabored, CTA. Heart: RRR no s3, s4, or murmurs.  Neck: No carotid bruits. No lymphadenopathy.  ? minimal JVD. Abdomen: Bowel sounds present, abdomen soft and non-tender without masses or hernias noted. Msk:  No spine or cva tenderness. No weakness, no joint deformities or effusions. Extremities:  No clubbing, cyanosis or edema. DP/PT/Radials 2+ and equal bilaterally. Neuro: Alert and oriented X 3. No focal deficits noted. Psych:  Good affect, responds appropriately Skin: No rashes or lesions noted.  Labs:   Lab Results  Component Value Date   WBC 5.7 01/10/2016   HGB 11.8 (L) 01/10/2016   HCT 35.5 (L) 01/10/2016   MCV 86.0 01/10/2016   PLT 229 01/10/2016   No results for input(s): INR in the last 72 hours.  Recent Labs Lab 01/10/16 2124  NA 135  K 3.2*  CL 104  CO2 23  BUN 32*  CREATININE 2.84*  CALCIUM 8.5*  PROT 6.4*  BILITOT 0.4  ALKPHOS 55  ALT 19  AST 24  GLUCOSE 111*  ALBUMIN 3.1*   No results found for: MG  Recent Labs  01/11/16 0046 01/11/16 0332  TROPONINI <0.03 <0.03    Recent Labs  01/10/16 2131  TROPIPOC 0.00   No results found for: PROBNP Lab Results  Component Value Date   CHOL 259 (H) 08/23/2014   HDL 33 (L) 08/23/2014   LDLCALC 158 (H) 08/23/2014   TRIG 338 (H) 08/23/2014   Echo 3/16 LV EF: 55% -  60%  ------------------------------------------------------------------- Indications:   Chest pain 786.51.  ------------------------------------------------------------------- History:  PMH:  Myocardial infarction. Risk  factors: Hypertension.  ------------------------------------------------------------------- Study Conclusions  - Left ventricle: The cavity size was normal. There was mild concentric hypertrophy. 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 Systolic function was normal. The estimated ejection fraction was in the range of 55% to 60%. Wall motion was normal; there were no regional wall motion abnormalities. Doppler parameters are consistent with abnormal left ventricular relaxation (grade 1 diastolic dysfunction). - Aortic valve: There was trivial regurgitation. - Left atrium: The atrium was mildly dilated. - Pulmonary arteries: Systolic pressure was mildly increased. PA peak pressure: 36 mm Hg (S).  Stress test 05/2014 IMPRESSION: 1. No reversible ischemia or infarction.  2. Normal left ventricular wall motion.  3. Left ventricular ejection fraction 56%  4. Low-risk stress test findings*.   Radiology:  Dg Chest 2 View  Result Date: 01/10/2016 CLINICAL DATA:  Acute onset of right-sided chest pressure and shortness of breath. Initial encounter. EXAM: CHEST  2 VIEW COMPARISON:  Chest radiograph performed 10/03/2015 FINDINGS: The lungs are well-aerated and clear. There is no evidence of focal opacification, pleural effusion or pneumothorax. The heart is borderline normal in size. No acute osseous abnormalities are seen. IMPRESSION: No acute cardiopulmonary process seen. Electronically Signed   By: Roanna RaiderJeffery  Chang M.D.   On: 01/10/2016 21:54    ASSESSMENT AND PLAN:     1. Chest pain with high risk for cardiac etiology - EKG without acute ischemic changes. He has a chronic T-wave inversion in inferior lateral lead. Troponin negative. He has recurrent  cardiac evaluation for chest pain. Initial plan was to do cardiac catheterization however will defer due to chronic kidney disease stage IV. We will proceed with  exercise Myoview this admission.  2. CAD - History of 2 stent placement in 2013 at [place]. No details available.  3. HTN - Minimally elevated this morning. Continue amlodipine 10 mg. Metoprolol 50 mg twice a day. Will increase his Imdur to 30 milligrams once a day.  4. Chronic kidney disease, stage IV (severe) (HCC) - Seems gradually worsening serum creatinine since last year. Baseline around 2.4.  5. Hyperglycemia - Per primary  6. HLD - No results found for requested  labs within last 8760 hours. Continue statin. Will get lipid panel.   SignedManson Passey, PA 01/11/2016, 7:28 AM Pager [phone]  Co-Sign MD  Patient seen and discussed with PA Sharrell Ku, I agree with his documentation. 60 yo Makari history of CAD with prior stenting presents with chest pain. Trop neg x 3, EKG inferior and lateral precordial ST/T changes that are chronic   Hgb 11.8, Plt 229, K 3.2, Cr 2.84 (baseline 2.6), BNP 17, Mg 2 CXR no acute process EKG SR, chronic ST/T changes inferior and lateral   60 yo Bird with history of chest pain presents with chest pain. Chest pain is fairly atypical, can last up to 2-3 hours and at times can be positional. Significant CKD limits options for invasive evaluation, we will obtain a nuclear stress to further risk stratify. Further recs pending results. Continue medical therapy with ASA, atorva 40, imdur, lopressor. No ACE-I/ARB given significant CKD. If low risk nuclear stress will titrate antianginals and plan for trial of PPI  Dominga Ferry MD

## 2016-01-11 NOTE — Discharge Summary (Signed)
Physician Discharge Summary  Richard Barnett LevelsFrazier ZOX:[id number] DOB: Jun 08, 1955 DOA: 01/10/2016  PCP: Massie MaroonHollis,[name], FNP  Admit date: 01/10/2016 Discharge date: 01/11/2016  Time spent: 45 minutes  Recommendations for Outpatient Follow-up:  Patient will be discharged to home.  Patient will need to follow up with primary care provider within one week of discharge, discuss diabetes. Patient should continue medications as prescribed.  Patient should follow a heart healthy diet.   Discharge Diagnoses:  Chest pain with history of CAD Chronic kidney disease, stage IV Essential Hypertension Hyperlipidemia Depression Hyperglycemia  Discharge Condition: Stable  Diet recommendation: heart healthy  Filed Weights   01/11/16 0042 01/11/16 0510  Weight: 105.8 kg (233 lb 4.8 oz) 105.8 kg (233 lb 4.8 oz)    History of present illness:  on 01/10/2016 by Dr. Lyda PeroneJared Barnett Richard [name] a 60 y.o.malewith medical history significant of CAD s/p MI and stent in past, HTN, CKD stage 4 follows [facility]. Patient presents to the ED with c/o crushing chest pain, severe in intensity, located in L chest. Took ASA 324, called EMS. 2x NTG en route improved / resolved his CP.  Hospital Course:  Chest pain with history of CAD -patient is high risk  -EKG showed no acute ischemic changes (chronic TWI in inferolateral leads) -Trop negative -Exercise myoview today was low risk (holding off on cath due to CKD,stage IV) -Patient had 2 stents placed in [place] in 2013 -Continue aspirin, statin, metoprolol, imdur (dose increased) -No ACEi or ARB due to CKD,stage IV  Chronic kidney disease, stage IV -Creatinine currently 2.84 -Baseline around 2.4 -GFR still in stage IV  Essential Hypertension -Continue metoprolol, amlodipine, clonidine, imdur (dose increased by cardiology)  Hyperlipidemia -continue statin  Depression -Continue celexa  Hyperglycemia -Hemoglobin A1c pending- can be  followed by PCP  Consultants Cardiology  Procedures  Lexiscan  Discharge Exam: Vitals:   01/11/16 1001 01/11/16 1229  BP:  (!) 153/82  Pulse: 75 61  Resp:    Temp:  97.9 F (36.6 C)   Exam  General: Well developed, well nourished, NAD, appears stated age  HEENT: NCAT,  mucous membranes moist.   Cardiovascular: S1 S2 auscultated, RRR, no murmurs  Respiratory: Clear to auscultation bilaterally with equal chest rise  Abdomen: Soft, nontender, nondistended, + bowel sounds  Extremities: warm dry without cyanosis clubbing or edema  Neuro: AAOx3, nonfocal  Psych: Normal affect and demeanor with intact judgement and insight  Discharge Instructions Discharge Instructions    Discharge instructions    Complete by:  As directed    Patient will be discharged to home.  Patient will need to follow up with primary care provider within one week of discharge, discuss diabetes. Patient should continue medications as prescribed.  Patient should follow a heart healthy diet.     Current Discharge Medication List    CONTINUE these medications which have CHANGED   Details  isosorbide mononitrate (IMDUR) 30 MG 24 hr tablet Take 1 tablet (30 mg total) by mouth daily. Qty: 30 tablet, Refills: 0      CONTINUE these medications which have NOT CHANGED   Details  amLODipine (NORVASC) 10 MG tablet TAKE ONE (1) TABLET BY MOUTH EVERY DAY Qty: 30 tablet, Refills: 3    aspirin 81 MG EC tablet Take 1 tablet by mouth daily Qty: 30 tablet, Refills: 3    atorvastatin (LIPITOR) 40 MG tablet TAKE ONE TABLET BY MOUTH EVERY EVENING AT SIX P.M. Qty: 30 tablet, Refills: 3    cloNIDine (CATAPRES) 0.2  MG tablet TAKE ONE (1) TABLET BY MOUTH TWO (2) TIMES DAILY Qty: 60 tablet, Refills: 3    metoprolol (LOPRESSOR) 50 MG tablet TAKE ONE (1) TABLET BY MOUTH TWO (2) TIMES DAILY Qty: 60 tablet, Refills: 3    nitroGLYCERIN (NITROSTAT) 0.4 MG SL tablet DISSOLVE 1 TABLET UNDER THE TONGUE AS NEEDED FOR  CHEST PAIN. REPEAT AS NEEDED EVERY 5 MINUTES UP TO A TOTAL OF 3 DOSES Qty: 25 tablet, Refills: 2    citalopram (CELEXA) 40 MG tablet Take 40 mg by mouth daily.   Associated Diagnoses: Depression       Allergies  Allergen Reactions  . Penicillins Swelling and Rash    Swelling of whole body  Has patient had a PCN reaction causing immediate rash, facial/tongue/throat swelling, SOB or lightheadedness with hypotension: No Has patient had a PCN reaction causing severe rash involving mucus membranes or skin necrosis: No Has patient had a PCN reaction that required hospitalization No Has patient had a PCN reaction occurring within the last 10 years: No If all of the above answers are "NO", then may proceed with Cephalosporin use.    Follow-up Information    Tobias Alexander, MD Follow up.   Specialty:  Cardiology Why:  The office will call you in 2-3 business days to arrange hospital follow-up.  Contact information: [address] [place] [address] [phone]        Massie Maroon, FNP. Schedule an appointment as soon as possible for a visit in 1 week(s).   Specialty:  Family Medicine Why:  Hospital follow up, discuss possible diabetes Contact information: [address] [place] [address] [phone]            The results of significant diagnostics from this hospitalization (including imaging, microbiology, ancillary and laboratory) are listed below for reference.    Significant Diagnostic Studies: Dg Chest 2 View  Result Date: 01/10/2016 CLINICAL DATA:  Acute onset of right-sided chest pressure and shortness of breath. Initial encounter. EXAM: CHEST  2 VIEW COMPARISON:  Chest radiograph performed 10/03/2015 FINDINGS: The lungs are well-aerated and clear. There is no evidence of focal opacification, pleural effusion or pneumothorax. The heart is borderline normal in size. No acute osseous abnormalities are seen. IMPRESSION: No acute  cardiopulmonary process seen. Electronically Signed   By: Roanna Raider M.D.   On: 01/10/2016 21:54   Nm Myocar Multi W/spect W/wall Motion / Ef  Result Date: 01/11/2016 CLINICAL DATA:  Chest pain EXAM: MYOCARDIAL IMAGING WITH SPECT (REST AND PHARMACOLOGIC-STRESS) GATED LEFT VENTRICULAR WALL MOTION STUDY LEFT VENTRICULAR EJECTION FRACTION TECHNIQUE: Standard myocardial SPECT imaging was performed after resting intravenous injection of 10 mCi Tc-67m tetrofosmin. Subsequently, intravenous infusion of Lexiscan was performed under the supervision of the Cardiology staff. At peak effect of the drug, 30 mCi Tc-76m tetrofosmin was injected intravenously and standard myocardial SPECT imaging was performed. Quantitative gated imaging was also performed to evaluate left ventricular wall motion, and estimate left ventricular ejection fraction. COMPARISON:  None. FINDINGS: Perfusion: Allowing for inferior attenuation, No significant decreased activity in the left ventricle on stress imaging to suggest reversible ischemia or infarction. Wall Motion: Normal left ventricular wall motion. No left ventricular dilation. Left Ventricular Ejection Fraction: 52 % End diastolic volume 100 ml End systolic volume 48 ml IMPRESSION: 1. No reversible ischemia or infarction. 2. Normal left ventricular wall motion. 3. Left ventricular ejection fraction 52% 4. Non invasive risk stratification*: Low *2012 Appropriate Use Criteria for Coronary Revascularization Focused  Update: J Am Coll Cardiol. 2012;59(9):857-881. http://content.[email or web address].aspx?articleid=[email or web address] Electronically Signed   By: Judie Petit.  Shick M.D.   On: 01/11/2016 13:19    Microbiology: No results found for this or any previous visit (from the past 240 hour(s)).   Labs: Basic Metabolic Panel:  Recent Labs Lab 01/10/16 2124 01/11/16 0829  NA 135  --   K 3.2*  --   CL 104  --   CO2 23  --   GLUCOSE 111*  --   BUN 32*  --   CREATININE 2.84*  --   CALCIUM  8.5*  --   MG  --  2.0   Liver Function Tests:  Recent Labs Lab 01/10/16 2124  AST 24  ALT 19  ALKPHOS 55  BILITOT 0.4  PROT 6.4*  ALBUMIN 3.1*   No results for input(s): LIPASE, AMYLASE in the last 168 hours. No results for input(s): AMMONIA in the last 168 hours. CBC:  Recent Labs Lab 01/10/16 2124  WBC 5.7  NEUTROABS 1.7  HGB 11.8*  HCT 35.5*  MCV 86.0  PLT 229   Cardiac Enzymes:  Recent Labs Lab 01/11/16 0046 01/11/16 0332 01/11/16 0716  TROPONINI <0.03 <0.03 <0.03   BNP: BNP (last 3 results)  Recent Labs  01/10/16 2126  BNP 17.1    ProBNP (last 3 results) No results for input(s): PROBNP in the last 8760 hours.  CBG:  Recent Labs Lab 01/11/16 0037  GLUCAP 171*       Signed:  Edsel Petrin  [facility] 01/11/2016, 1:49 PM

## 2016-01-12 LAB — HEMOGLOBIN A1C
Hgb A1c MFr Bld: 6.4 % — ABNORMAL HIGH (ref 4.8–5.6)
Mean Plasma Glucose: 137 mg/dL

## 2016-01-19 ENCOUNTER — Other Ambulatory Visit: Payer: Self-pay | Admitting: Nephrology

## 2016-01-19 DIAGNOSIS — N184 Chronic kidney disease, stage 4 (severe): Secondary | ICD-10-CM

## 2016-01-22 ENCOUNTER — Other Ambulatory Visit: Payer: Self-pay

## 2016-02-06 ENCOUNTER — Encounter: Payer: Self-pay | Admitting: Physician Assistant

## 2016-02-06 ENCOUNTER — Ambulatory Visit: Payer: Self-pay | Admitting: Physician Assistant

## 2016-02-06 NOTE — Progress Notes (Deleted)
Cardiology Office Note    Date:  02/06/2016  ID:  Richard Barnett, [place] [birth year], MRN [id number] PCP:  Massie Maroon, FNP  Cardiologist:  Dr. Delton See in 2016 consultation   Chief Complaint: f/u chest pain  History of Present Illness:  Richard Barnett is a 60 y.o. [place] with history of CAD s/p 2 stent placement in [place], HTN, HLD, CKD stage IV (followed by [facility]), DM and DVT who presents for f/u of chest pain.  He moved from [place] in early 2016 where he had his 2 stent placement in 2013. He was seen by Dr. Delton See in 05/2014 during hospital admission 05/2014 for chest pain in which nuclear stress test showed no reversible ischemia or infarction, ejection fraction 56%, low risk. He was then seen by Dr. Anne Fu  during chest pain consult 02/2015 and was started on Imdur. No further intervention done and patient had not been seen in the office. He was recently admitted 11/5 with dull chest pain while watching TV + dyspnea. He ruled out for MI. CXR was nonacute. Nuclear stress test 01/11/16 was negative, EF 52%. Labs otherwise notable for A1C 6.4, LDL 79, BNP 17, Cr 2.84, K 3.2. Trial of PPI was recommended but do not see this on his list.    Past Medical History:  Diagnosis Date  . Arthritis    "right leg" (08/22/2014)  . CAD (coronary artery disease)    a. CAD s/p 2 stent placement in [place] 2016. b. Neg nuc 05/2014 & 01/2016.  Marland Kitchen CKD (chronic kidney disease), stage IV (HCC)    [name] 08/22/2014  . Daily headache   . Depression    "I always get that" (08/22/2014)  . Diabetes mellitus (HCC)   . DVT (deep venous thrombosis) (HCC) ?   RLE  . Heart murmur   . Hypercholesterolemia   . Hypertension     Past Surgical History:  Procedure Laterality Date  . CORONARY ANGIOPLASTY WITH STENT PLACEMENT     "1 + 1"    Current Medications: Current Outpatient Prescriptions  Medication Sig Dispense Refill  . amLODipine (NORVASC) 10 MG tablet TAKE ONE (1) TABLET BY MOUTH EVERY DAY 30 tablet  3  . aspirin 81 MG EC tablet Take 1 tablet by mouth daily 30 tablet 3  . atorvastatin (LIPITOR) 40 MG tablet TAKE ONE TABLET BY MOUTH EVERY EVENING AT SIX P.M. 30 tablet 3  . citalopram (CELEXA) 40 MG tablet Take 40 mg by mouth daily.    . cloNIDine (CATAPRES) 0.2 MG tablet TAKE ONE (1) TABLET BY MOUTH TWO (2) TIMES DAILY 60 tablet 3  . isosorbide mononitrate (IMDUR) 30 MG 24 hr tablet Take 1 tablet (30 mg total) by mouth daily. 30 tablet 0  . metoprolol (LOPRESSOR) 50 MG tablet TAKE ONE (1) TABLET BY MOUTH TWO (2) TIMES DAILY 60 tablet 3  . nitroGLYCERIN (NITROSTAT) 0.4 MG SL tablet DISSOLVE 1 TABLET UNDER THE TONGUE AS NEEDED FOR CHEST PAIN. REPEAT AS NEEDED EVERY 5 MINUTES UP TO A TOTAL OF 3 DOSES 25 tablet 2   No current facility-administered medications for this visit.      Allergies:   Penicillins   Social History   Social History  . Marital status: Divorced    Spouse name: N/A  . Number of children: N/A  . Years of education: N/A   Social History Main Topics  . Smoking status: Former Smoker    Packs/day: 0.00    Years: 40.00    Types: Cigarettes  Quit date: 08/29/2010  . Smokeless tobacco: Never Used     Comment: "quit smoking cigarettes in ~ 2011"  . Alcohol use No     Comment: 08/22/2014 "stopped in ~ 2011"  . Drug use: No     Comment: 08/22/2014 "stopped in ~ 2011; all types of drugs""  . Sexual activity: No   Other Topics Concern  . Not on file   Social History Narrative  . No narrative on file     Family History:  The patient's family history includes Heart disease in his father and mother; Hypertension in his father and mother; Kidney disease in his mother. ***  ROS:   Please see the history of present illness. Otherwise, review of systems is positive for ***.  All other systems are reviewed and otherwise negative.    PHYSICAL EXAM:   VS:  There were no vitals taken for this visit.  BMI: There is no height or weight on file to calculate BMI. GEN: Well  nourished, well developed, in no acute distress  HEENT: normocephalic, atraumatic Neck: no JVD, carotid bruits, or masses Cardiac: ***RRR; no murmurs, rubs, or gallops, no edema  Respiratory:  clear to auscultation bilaterally, normal work of breathing GI: soft, nontender, nondistended, + BS MS: no deformity or atrophy  Skin: warm and dry, no rash Neuro:  Alert and Oriented x 3, Strength and sensation are intact, follows commands Psych: euthymic mood, full affect  Wt Readings from Last 3 Encounters:  01/11/16 233 lb 4.8 oz (105.8 kg)  10/03/15 218 lb (98.9 kg)  05/22/15 218 lb (98.9 kg)      Studies/Labs Reviewed:   EKG:  EKG was ordered today and personally reviewed by me and demonstrates *** EKG was not ordered today.***  Recent Labs: 02/11/2015: TSH 0.296 01/10/2016: ALT 19; B Natriuretic Peptide 17.1; BUN 32; Creatinine, Ser 2.84; Hemoglobin 11.8; Platelets 229; Potassium 3.2; Sodium 135 01/11/2016: Magnesium 2.0   Lipid Panel    Component Value Date/Time   CHOL 137 01/11/2016 0830   TRIG 118 01/11/2016 0830   HDL 34 (L) 01/11/2016 0830   CHOLHDL 4.0 01/11/2016 0830   VLDL 24 01/11/2016 0830   LDLCALC 79 01/11/2016 0830    Additional studies/ records that were reviewed today include: Summarized above.***    ASSESSMENT & PLAN:   1. Chest pain 2. CAD 3. Essential HTN 4. CKD stage IV  Disposition: F/u with ***   Medication Adjustments/Labs and Tests Ordered: Current medicines are reviewed at length with the patient today.  Concerns regarding medicines are outlined above. Medication changes, Labs and Tests ordered today are summarized above and listed in the Patient Instructions accessible in Encounters.   Thomasene MohairSigned, Dayna Dunn PA-C  02/06/2016 7:44 AM    [facility] [address], [place], [place]  [address] Phone: [phone]; Fax: [phone]

## 2016-02-09 ENCOUNTER — Encounter: Payer: Self-pay | Admitting: Physician Assistant

## 2016-02-10 ENCOUNTER — Other Ambulatory Visit: Payer: Self-pay

## 2016-02-10 MED ORDER — ATORVASTATIN CALCIUM 40 MG PO TABS: ORAL_TABLET | ORAL | 0 refills | Status: DC

## 2016-02-10 MED ORDER — AMLODIPINE BESYLATE 10 MG PO TABS: ORAL_TABLET | ORAL | 0 refills | Status: DC

## 2016-02-10 MED ORDER — ASPIRIN 81 MG PO TBEC: 81.0000 mg | DELAYED_RELEASE_TABLET | Freq: Every day | ORAL | 0 refills | Status: DC

## 2016-02-10 MED ORDER — METOPROLOL TARTRATE 50 MG PO TABS: ORAL_TABLET | ORAL | 0 refills | Status: DC

## 2016-02-10 MED ORDER — CLONIDINE HCL 0.2 MG PO TABS: ORAL_TABLET | ORAL | 0 refills | Status: DC

## 2016-02-10 MED ORDER — ISOSORBIDE MONONITRATE ER 30 MG PO TB24: 30.0000 mg | ORAL_TABLET | Freq: Every day | ORAL | 0 refills | Status: DC

## 2016-02-10 NOTE — Telephone Encounter (Signed)
Refilled 1 fill of medications until patient can come in for follow up appointment. Thanks!

## 2016-02-19 ENCOUNTER — Ambulatory Visit: Payer: Self-pay | Admitting: Family Medicine

## 2016-03-09 ENCOUNTER — Other Ambulatory Visit: Payer: Self-pay | Admitting: Family Medicine

## 2016-04-15 ENCOUNTER — Encounter (HOSPITAL_COMMUNITY): Payer: Self-pay | Admitting: Emergency Medicine

## 2016-04-15 ENCOUNTER — Emergency Department (HOSPITAL_COMMUNITY): Payer: Medicaid Other

## 2016-04-15 ENCOUNTER — Emergency Department (HOSPITAL_COMMUNITY)
Admission: EM | Admit: 2016-04-15 | Discharge: 2016-04-15 | Disposition: A | Discharge status: Home / Self Care | Payer: Medicaid Other | Attending: Physician Assistant | Admitting: Physician Assistant

## 2016-04-15 DIAGNOSIS — M25561 Pain in right knee: Secondary | ICD-10-CM | POA: Diagnosis present

## 2016-04-15 DIAGNOSIS — Z5321 Procedure and treatment not carried out due to patient leaving prior to being seen by health care provider: Secondary | ICD-10-CM | POA: Insufficient documentation

## 2016-04-15 NOTE — ED Triage Notes (Signed)
Per EMS-states right knee pain for 3 days-no injury or trauma-

## 2016-05-11 ENCOUNTER — Other Ambulatory Visit: Payer: Self-pay | Admitting: Family Medicine

## 2016-07-17 ENCOUNTER — Emergency Department (HOSPITAL_COMMUNITY)
Admission: EM | Admit: 2016-07-17 | Discharge: 2016-07-17 | Disposition: A | Discharge status: Home / Self Care | Payer: Medicaid Other | Attending: Emergency Medicine | Admitting: Emergency Medicine

## 2016-07-17 ENCOUNTER — Encounter (HOSPITAL_COMMUNITY): Payer: Self-pay | Admitting: *Deleted

## 2016-07-17 DIAGNOSIS — N184 Chronic kidney disease, stage 4 (severe): Secondary | ICD-10-CM | POA: Insufficient documentation

## 2016-07-17 DIAGNOSIS — Z87891 Personal history of nicotine dependence: Secondary | ICD-10-CM | POA: Diagnosis not present

## 2016-07-17 DIAGNOSIS — I509 Heart failure, unspecified: Secondary | ICD-10-CM | POA: Insufficient documentation

## 2016-07-17 DIAGNOSIS — I2510 Atherosclerotic heart disease of native coronary artery without angina pectoris: Secondary | ICD-10-CM | POA: Diagnosis not present

## 2016-07-17 DIAGNOSIS — E1122 Type 2 diabetes mellitus with diabetic chronic kidney disease: Secondary | ICD-10-CM | POA: Diagnosis not present

## 2016-07-17 DIAGNOSIS — Z7982 Long term (current) use of aspirin: Secondary | ICD-10-CM | POA: Insufficient documentation

## 2016-07-17 DIAGNOSIS — I130 Hypertensive heart and chronic kidney disease with heart failure and stage 1 through stage 4 chronic kidney disease, or unspecified chronic kidney disease: Secondary | ICD-10-CM | POA: Insufficient documentation

## 2016-07-17 DIAGNOSIS — I252 Old myocardial infarction: Secondary | ICD-10-CM | POA: Insufficient documentation

## 2016-07-17 DIAGNOSIS — K5900 Constipation, unspecified: Secondary | ICD-10-CM | POA: Diagnosis not present

## 2016-07-17 DIAGNOSIS — R1084 Generalized abdominal pain: Secondary | ICD-10-CM | POA: Diagnosis present

## 2016-07-17 LAB — URINALYSIS, ROUTINE W REFLEX MICROSCOPIC
BACTERIA UA: NONE SEEN
Bilirubin Urine: NEGATIVE
Glucose, UA: NEGATIVE mg/dL
Hgb urine dipstick: NEGATIVE
Ketones, ur: NEGATIVE mg/dL
Leukocytes, UA: NEGATIVE
Nitrite: NEGATIVE
Protein, ur: >=300 mg/dL — AB
SPECIFIC GRAVITY, URINE: 1.014 (ref 1.005–1.030)
SQUAMOUS EPITHELIAL / LPF: NONE SEEN
WBC, UA: NONE SEEN WBC/hpf (ref 0–5)
pH: 5 (ref 5.0–8.0)

## 2016-07-17 LAB — CBC
HEMATOCRIT: 37.4 % — AB (ref 39.0–52.0)
Hemoglobin: 12.1 g/dL — ABNORMAL LOW (ref 13.0–17.0)
MCH: 28.1 pg (ref 26.0–34.0)
MCHC: 32.4 g/dL (ref 30.0–36.0)
MCV: 87 fL (ref 78.0–100.0)
Platelets: 269 10*3/uL (ref 150–400)
RBC: 4.3 MIL/uL (ref 4.22–5.81)
RDW: 14.1 % (ref 11.5–15.5)
WBC: 5.6 10*3/uL (ref 4.0–10.5)

## 2016-07-17 LAB — COMPREHENSIVE METABOLIC PANEL
ALK PHOS: 60 U/L (ref 38–126)
ALT: 29 U/L (ref 17–63)
AST: 24 U/L (ref 15–41)
Albumin: 3.5 g/dL (ref 3.5–5.0)
Anion gap: 9 (ref 5–15)
BUN: 37 mg/dL — ABNORMAL HIGH (ref 6–20)
CALCIUM: 8.9 mg/dL (ref 8.9–10.3)
CHLORIDE: 109 mmol/L (ref 101–111)
CO2: 22 mmol/L (ref 22–32)
CREATININE: 3.7 mg/dL — AB (ref 0.61–1.24)
GFR, EST AFRICAN AMERICAN: 19 mL/min — AB (ref >60)
GFR, EST NON AFRICAN AMERICAN: 16 mL/min — AB (ref >60)
Glucose, Bld: 106 mg/dL — ABNORMAL HIGH (ref 65–99)
Potassium: 4.4 mmol/L (ref 3.5–5.1)
Sodium: 140 mmol/L (ref 135–145)
Total Bilirubin: 0.3 mg/dL (ref 0.3–1.2)
Total Protein: 7.1 g/dL (ref 6.5–8.1)

## 2016-07-17 LAB — LIPASE, BLOOD: Lipase: 22 U/L (ref 11–51)

## 2016-07-17 MED ORDER — ONDANSETRON HCL 4 MG/2ML IJ SOLN
4.0000 mg | Freq: Once | INTRAMUSCULAR | Status: DC
  Filled 2016-07-17: qty 2

## 2016-07-17 MED ORDER — ONDANSETRON HCL 4 MG PO TABS: 4.0000 mg | ORAL_TABLET | Freq: Once | ORAL | Status: DC

## 2016-07-17 MED ORDER — GLYCERIN (ADULT) 2 G RE SUPP: 1.0000 | RECTAL | 0 refills | Status: DC | PRN

## 2016-07-17 MED ORDER — SODIUM CHLORIDE 0.9 % IV BOLUS (SEPSIS)
1000.0000 mL | Freq: Once | INTRAVENOUS | Status: AC
  Administered 2016-07-17: 1000 mL via INTRAVENOUS

## 2016-07-17 NOTE — ED Triage Notes (Signed)
To ED via [facility], from home, for eval of 3 weeks of abd pain and swelling. States he is nauseated but no vomiting. Complains of frequent urination. Last BM this am and 'only a little bit' but before today it was a week since BM

## 2016-07-17 NOTE — ED Notes (Signed)
Attempted to collect UA. Pt unable to provide at this time. Urinal at bedside 

## 2016-07-17 NOTE — ED Provider Notes (Signed)
[facility]-EMERGENCY DEPT Provider Note   CSN: [id number] Arrival date & time: 07/17/16  1143     History   Chief Complaint Chief Complaint  Patient presents with  . Abdominal Pain    HPI Richard Barnett is a 61 y.o. Richard Barnett.  Patient is a poor historian. He reports he is followed outpatient by both cardiology and nephrology, no notes in our system. He reports over the last 3 weeks she's developed diffuse abdominal discomfort, nausea, abdominal distention. He has had decreased oral intake. Decreased urinary output. No vomiting. Reports constipation as well, still having some bowel movements and is passing gas well. No pain in his penis or testicles. No fevers or chills. Known other infectious symptoms.   The history is provided by the patient.  Illness  This is a new problem. Episode onset: 3 wks ago. The problem occurs constantly. The problem has been gradually worsening. Associated symptoms include abdominal pain. Pertinent negatives include no chest pain and no shortness of breath. He has tried nothing for the symptoms.    Past Medical History:  Diagnosis Date  . Arthritis    "right leg" (08/22/2014)  . CAD (coronary artery disease)    a. CAD s/p 2 stent placement in [place] 2016. b. Neg nuc 05/2014 & 01/2016.  Marland Kitchen CKD (chronic kidney disease), stage IV (HCC)    [name] 08/22/2014  . Daily headache   . Depression    "I always get that" (08/22/2014)  . Diabetes mellitus (HCC)   . DVT (deep venous thrombosis) (HCC) ?   RLE  . Heart murmur   . Hypercholesterolemia   . Hypertension     Patient Active Problem List   Diagnosis Date Noted  . Anemia of chronic renal failure   . Secondary hyperparathyroidism of renal origin (HCC)   . Proteinuria   . Arthritis   . Depression   . Deep vein thrombosis (HCC)   . Myocardial infarction (HCC)   . Heart failure, unspecified (HCC)   . Heart murmur   . Prediabetes 02/22/2015  . Chronic kidney disease, stage IV (severe) (HCC)   . Hyperlipidemia  05/22/2014  . Hypokalemia 05/21/2014  . CAD (coronary artery disease) 05/21/2014  . Hypertension 05/21/2014    Past Surgical History:  Procedure Laterality Date  . CORONARY ANGIOPLASTY WITH STENT PLACEMENT     "1 + 1"       Home Medications    Prior to Admission medications   Medication Sig Start Date End Date Taking? Authorizing Provider  amLODipine (NORVASC) 10 MG tablet TAKE ONE (1) TABLET BY MOUTH EVERY DAY Patient taking differently: Take 10 mg by mouth daily.  02/10/16  Yes Massie Maroon, FNP  aspirin 81 MG EC tablet Take 1 tablet (81 mg total) by mouth daily. Swallow whole. 02/10/16  Yes Massie Maroon, FNP  atorvastatin (LIPITOR) 40 MG tablet TAKE ONE TABLET BY MOUTH EVERY EVENING AT SIX P.M. Patient taking differently: Take 40 mg by mouth daily.  02/10/16  Yes Massie Maroon, FNP  citalopram (CELEXA) 40 MG tablet Take 40 mg by mouth daily.   Yes [provider]  cloNIDine (CATAPRES) 0.2 MG tablet TAKE ONE (1) TABLET BY MOUTH TWO (2) TIMES DAILY Patient taking differently: Take 0.2 mg by mouth daily.  02/10/16  Yes Massie Maroon, FNP  isosorbide mononitrate (IMDUR) 30 MG 24 hr tablet Take 1 tablet (30 mg total) by mouth daily. 02/10/16  Yes Massie Maroon, FNP  metoprolol (LOPRESSOR) 50 MG tablet TAKE  ONE (1) TABLET BY MOUTH TWO (2) TIMES DAILY Patient taking differently: Take 50 mg by mouth daily.  02/10/16  Yes Massie Maroon, FNP  PRESCRIPTION MEDICATION Place into both eyes See admin instructions. 2 different eye drops from [facility] - instill 1 drop of each into both eyes every morning   Yes [provider]  glycerin adult 2 g suppository Place 1 suppository rectally as needed for constipation. 07/17/16   Lindalou Hose, MD  nitroGLYCERIN (NITROSTAT) 0.4 MG SL tablet DISSOLVE 1 TABLET UNDER THE TONGUE AS NEEDED FOR CHEST PAIN. REPEAT AS NEEDED EVERY 5 MINUTES UP TO A TOTAL OF 3 DOSES 12/15/15   Henrietta Hoover, NP    Family  History Family History  Problem Relation Age of Onset  . Hypertension Mother   . Kidney disease Mother   . Heart disease Mother   . Heart disease Father   . Hypertension Father     Social History Social History  Substance Use Topics  . Smoking status: Former Smoker    Packs/day: 0.00    Years: 40.00    Types: Cigarettes    Quit date: 08/29/2010  . Smokeless tobacco: Never Used     Comment: "quit smoking cigarettes in ~ 2011"  . Alcohol use No     Comment: 08/22/2014 "stopped in ~ 2011"     Allergies   Penicillins   Review of Systems Review of Systems  Constitutional: Negative for chills and fever.  Respiratory: Negative for shortness of breath.   Cardiovascular: Negative for chest pain and palpitations.  Gastrointestinal: Positive for abdominal pain, constipation and nausea. Negative for blood in stool, diarrhea and vomiting.  Genitourinary: Positive for decreased urine volume. Negative for dysuria, frequency, penile pain and testicular pain.  All other systems reviewed and are negative.    Physical Exam Updated Vital Signs BP (!) 175/104   Pulse 74   Temp 98.7 F (37.1 C) (Oral)   Resp (!) 21   SpO2 100%   Physical Exam  Constitutional: He appears well-developed and well-nourished.  HENT:  Head: Normocephalic and atraumatic.  Eyes: Conjunctivae are normal.  Neck: Neck supple.  Cardiovascular: Normal rate and regular rhythm.   No murmur heard. Pulmonary/Chest: Effort normal and breath sounds normal. No respiratory distress.  Abdominal: Soft. He exhibits distension. There is generalized tenderness. There is no rebound, no guarding, no tenderness at McBurney's point and negative Murphy's sign.  Musculoskeletal: He exhibits no edema.  Neurological: He is alert.  Skin: Skin is warm and dry.  Psychiatric: He has a normal mood and affect.  Nursing note and vitals reviewed.    ED Treatments / Results  Labs (all labs ordered are listed, but only abnormal  results are displayed) Labs Reviewed  COMPREHENSIVE METABOLIC PANEL - Abnormal; Notable for the following:       Result Value   Glucose, Bld 106 (*)    BUN 37 (*)    Creatinine, Ser 3.70 (*)    GFR calc non Af Amer 16 (*)    GFR calc Af Amer 19 (*)    All other components within normal limits  CBC - Abnormal; Notable for the following:    Hemoglobin 12.1 (*)    HCT 37.4 (*)    All other components within normal limits  URINALYSIS, ROUTINE W REFLEX MICROSCOPIC - Abnormal; Notable for the following:    APPearance HAZY (*)    Protein, ur >=300 (*)    All other components within normal limits  LIPASE, BLOOD    EKG  EKG Interpretation None       Radiology No results found.  Procedures Procedures (including critical care time)  Medications Ordered in ED Medications  sodium chloride 0.9 % bolus 1,000 mL (0 mLs Intravenous Stopped 07/17/16 2058)     Initial Impression / Assessment and Plan / ED Course  I have reviewed the triage vital signs and the nursing notes.  Pertinent labs & imaging results that were available during my care of the patient were reviewed by me and considered in my medical decision making (see chart for details).     Exam is not concerning for an acute abdomen. He has diffuse tenderness to palpation of his abdomen. No localizing pain. His abdomen is distended. Negative Murphy sign, and minimal tenderness to palpation over McBurney's point. No peritoneal signs. Doubt acute abdomen. Given the fact that the patient is having nausea, no vomiting, and reports that he has had a bowel movement today and continues to have normal flatus, have low suspicion for bowel obstruction. He does report constipation over the last 3 weeks. Bowel movements have been hard. We'll start the patient on a bowel regimen. He denies any blood in his stool.  No leukocytosis. Electrolytes are within normal limits. Kidney function continues to worsen. Last blood draw from 6 months ago  had a creatinine of 2.8; here it is 3.7 with a BUN 37. Urinalysis appears to be consistent with prior samples with proteinuria. No evidence of urinary tract infection. Spoke with on-call nephrology. They do not feel the patient would require admission at this time since he is continuing to urinate and his electrolytes look normal. He has a follow-up appointment 4 days from now, 07/21/16. Strongly encouraged him to keep this appointment.   Gave him some IV fluids here. We'll give him a prescription for bowel regimen as well as anti-emetics.  Final Clinical Impressions(s) / ED Diagnoses   Final diagnoses:  Constipation, unspecified constipation type    New Prescriptions Discharge Medication List as of 07/17/2016  8:19 PM    START taking these medications   Details  glycerin adult 2 g suppository Place 1 suppository rectally as needed for constipation., Starting Sat 07/17/2016, Print         Lindalou Hose, MD 07/18/16 [id number]    Gerhard Munch, MD 07/19/16 [phone]

## 2016-07-17 NOTE — ED Notes (Signed)
D/c instructions had note to see nephrologist in office 07-21-16.  Per Dr. Lindalou Hose, on call nephrologist advised pt already had appt on this date with him.  Explained to patient.

## 2016-07-17 NOTE — ED Notes (Signed)
Called [facility], [phone], pts aide, LM that pt is getting discharged, left her this nurse number to call back with any questions.  Pt took d/c instructions with him, this nurse advised him to give to aide for review. Pt verbalized understanding.

## 2016-07-29 ENCOUNTER — Other Ambulatory Visit: Payer: Self-pay | Admitting: *Deleted

## 2016-07-29 DIAGNOSIS — N185 Chronic kidney disease, stage 5: Secondary | ICD-10-CM

## 2016-07-29 DIAGNOSIS — Z01810 Encounter for preprocedural cardiovascular examination: Secondary | ICD-10-CM

## 2016-08-10 ENCOUNTER — Emergency Department (HOSPITAL_COMMUNITY): Payer: Medicaid Other

## 2016-08-10 ENCOUNTER — Encounter (HOSPITAL_COMMUNITY): Payer: Self-pay | Admitting: Emergency Medicine

## 2016-08-10 ENCOUNTER — Observation Stay (HOSPITAL_COMMUNITY)
Admission: EM | Admit: 2016-08-10 | Discharge: 2016-08-11 | Disposition: A | Discharge status: Home / Self Care | Payer: Medicaid Other | Attending: Student in an Organized Health Care Education/Training Program | Admitting: Student in an Organized Health Care Education/Training Program

## 2016-08-10 DIAGNOSIS — T465X6A Underdosing of other antihypertensive drugs, initial encounter: Secondary | ICD-10-CM

## 2016-08-10 DIAGNOSIS — Z87891 Personal history of nicotine dependence: Secondary | ICD-10-CM | POA: Diagnosis not present

## 2016-08-10 DIAGNOSIS — F329 Major depressive disorder, single episode, unspecified: Secondary | ICD-10-CM | POA: Diagnosis not present

## 2016-08-10 DIAGNOSIS — R809 Proteinuria, unspecified: Secondary | ICD-10-CM | POA: Diagnosis not present

## 2016-08-10 DIAGNOSIS — I10 Essential (primary) hypertension: Secondary | ICD-10-CM | POA: Diagnosis present

## 2016-08-10 DIAGNOSIS — N184 Chronic kidney disease, stage 4 (severe): Secondary | ICD-10-CM | POA: Diagnosis present

## 2016-08-10 DIAGNOSIS — R0789 Other chest pain: Principal | ICD-10-CM | POA: Insufficient documentation

## 2016-08-10 DIAGNOSIS — Z955 Presence of coronary angioplasty implant and graft: Secondary | ICD-10-CM

## 2016-08-10 DIAGNOSIS — I129 Hypertensive chronic kidney disease with stage 1 through stage 4 chronic kidney disease, or unspecified chronic kidney disease: Secondary | ICD-10-CM | POA: Insufficient documentation

## 2016-08-10 DIAGNOSIS — R0602 Shortness of breath: Secondary | ICD-10-CM | POA: Diagnosis not present

## 2016-08-10 DIAGNOSIS — T50906A Underdosing of unspecified drugs, medicaments and biological substances, initial encounter: Secondary | ICD-10-CM | POA: Diagnosis not present

## 2016-08-10 DIAGNOSIS — I160 Hypertensive urgency: Secondary | ICD-10-CM | POA: Diagnosis not present

## 2016-08-10 DIAGNOSIS — Z79899 Other long term (current) drug therapy: Secondary | ICD-10-CM | POA: Diagnosis not present

## 2016-08-10 DIAGNOSIS — Z91138 Patient's unintentional underdosing of medication regimen for other reason: Secondary | ICD-10-CM | POA: Diagnosis not present

## 2016-08-10 DIAGNOSIS — D649 Anemia, unspecified: Secondary | ICD-10-CM

## 2016-08-10 DIAGNOSIS — Z7982 Long term (current) use of aspirin: Secondary | ICD-10-CM | POA: Insufficient documentation

## 2016-08-10 DIAGNOSIS — E785 Hyperlipidemia, unspecified: Secondary | ICD-10-CM

## 2016-08-10 DIAGNOSIS — Z86718 Personal history of other venous thrombosis and embolism: Secondary | ICD-10-CM

## 2016-08-10 DIAGNOSIS — F32A Depression, unspecified: Secondary | ICD-10-CM | POA: Diagnosis present

## 2016-08-10 DIAGNOSIS — Z880 Allergy status to penicillin: Secondary | ICD-10-CM

## 2016-08-10 DIAGNOSIS — R079 Chest pain, unspecified: Secondary | ICD-10-CM | POA: Diagnosis present

## 2016-08-10 DIAGNOSIS — I2510 Atherosclerotic heart disease of native coronary artery without angina pectoris: Secondary | ICD-10-CM | POA: Diagnosis not present

## 2016-08-10 DIAGNOSIS — E1122 Type 2 diabetes mellitus with diabetic chronic kidney disease: Secondary | ICD-10-CM | POA: Diagnosis not present

## 2016-08-10 DIAGNOSIS — Z86711 Personal history of pulmonary embolism: Secondary | ICD-10-CM

## 2016-08-10 LAB — CBC
HEMATOCRIT: 36.8 % — AB (ref 39.0–52.0)
Hemoglobin: 11.9 g/dL — ABNORMAL LOW (ref 13.0–17.0)
MCH: 28.3 pg (ref 26.0–34.0)
MCHC: 32.3 g/dL (ref 30.0–36.0)
MCV: 87.6 fL (ref 78.0–100.0)
PLATELETS: 299 10*3/uL (ref 150–400)
RBC: 4.2 MIL/uL — ABNORMAL LOW (ref 4.22–5.81)
RDW: 14.3 % (ref 11.5–15.5)
WBC: 6 10*3/uL (ref 4.0–10.5)

## 2016-08-10 LAB — I-STAT TROPONIN, ED: Troponin i, poc: 0.02 ng/mL (ref 0.00–0.08)

## 2016-08-10 LAB — RPR: RPR: NONREACTIVE

## 2016-08-10 LAB — BASIC METABOLIC PANEL
Anion gap: 10 (ref 5–15)
BUN: 27 mg/dL — AB (ref 6–20)
CHLORIDE: 110 mmol/L (ref 101–111)
CO2: 21 mmol/L — AB (ref 22–32)
CREATININE: 3.23 mg/dL — AB (ref 0.61–1.24)
Calcium: 8.6 mg/dL — ABNORMAL LOW (ref 8.9–10.3)
GFR calc Af Amer: 22 mL/min — ABNORMAL LOW (ref >60)
GFR calc non Af Amer: 19 mL/min — ABNORMAL LOW (ref >60)
GLUCOSE: 124 mg/dL — AB (ref 65–99)
POTASSIUM: 3.9 mmol/L (ref 3.5–5.1)
Sodium: 141 mmol/L (ref 135–145)

## 2016-08-10 LAB — MRSA PCR SCREENING: MRSA BY PCR: NEGATIVE

## 2016-08-10 LAB — TROPONIN I: Troponin I: <0.03 ng/mL (ref <0.03)

## 2016-08-10 LAB — RAPID HIV SCREEN (HIV 1/2 AB+AG)
HIV 1/2 ANTIBODIES: NONREACTIVE
HIV-1 P24 Antigen - HIV24: NONREACTIVE

## 2016-08-10 LAB — RAPID URINE DRUG SCREEN, HOSP PERFORMED
Amphetamines: NOT DETECTED
BENZODIAZEPINES: NOT DETECTED
Barbiturates: NOT DETECTED
Cocaine: NOT DETECTED
Opiates: NOT DETECTED
Tetrahydrocannabinol: NOT DETECTED

## 2016-08-10 LAB — BRAIN NATRIURETIC PEPTIDE: B NATRIURETIC PEPTIDE 5: 25.2 pg/mL (ref 0.0–100.0)

## 2016-08-10 MED ORDER — CLONIDINE HCL 0.2 MG PO TABS
0.2000 mg | ORAL_TABLET | Freq: Every day | ORAL | Status: DC
  Administered 2016-08-10: 0.2 mg via ORAL
  Filled 2016-08-10: qty 1

## 2016-08-10 MED ORDER — ATORVASTATIN CALCIUM 40 MG PO TABS
40.0000 mg | ORAL_TABLET | Freq: Every day | ORAL | Status: DC
  Administered 2016-08-10: 40 mg via ORAL
  Filled 2016-08-10: qty 1

## 2016-08-10 MED ORDER — SENNOSIDES-DOCUSATE SODIUM 8.6-50 MG PO TABS
2.0000 | ORAL_TABLET | Freq: Two times a day (BID) | ORAL | Status: DC
  Administered 2016-08-10 – 2016-08-11 (×2): 2 via ORAL
  Filled 2016-08-10 (×2): qty 2

## 2016-08-10 MED ORDER — CLONIDINE HCL 0.2 MG PO TABS
0.2000 mg | ORAL_TABLET | Freq: Two times a day (BID) | ORAL | Status: DC
  Administered 2016-08-10 – 2016-08-11 (×2): 0.2 mg via ORAL
  Filled 2016-08-10 (×2): qty 1

## 2016-08-10 MED ORDER — ISOSORBIDE MONONITRATE ER 30 MG PO TB24
30.0000 mg | ORAL_TABLET | Freq: Every day | ORAL | Status: DC
  Administered 2016-08-10 – 2016-08-11 (×2): 30 mg via ORAL
  Filled 2016-08-10 (×2): qty 1

## 2016-08-10 MED ORDER — NITROGLYCERIN 2 % TD OINT
0.5000 [in_us] | TOPICAL_OINTMENT | Freq: Once | TRANSDERMAL | Status: AC
  Administered 2016-08-10: 0.5 [in_us] via TOPICAL
  Filled 2016-08-10: qty 1

## 2016-08-10 MED ORDER — HEPARIN SODIUM (PORCINE) 5000 UNIT/ML IJ SOLN
5000.0000 [IU] | Freq: Three times a day (TID) | INTRAMUSCULAR | Status: DC
  Administered 2016-08-10: 5000 [IU] via SUBCUTANEOUS
  Filled 2016-08-10 (×2): qty 1

## 2016-08-10 MED ORDER — CITALOPRAM HYDROBROMIDE 20 MG PO TABS
40.0000 mg | ORAL_TABLET | Freq: Every day | ORAL | Status: DC
  Administered 2016-08-11: 40 mg via ORAL
  Filled 2016-08-10: qty 2

## 2016-08-10 MED ORDER — AMLODIPINE BESYLATE 10 MG PO TABS
10.0000 mg | ORAL_TABLET | Freq: Every day | ORAL | Status: DC
  Administered 2016-08-10 – 2016-08-11 (×2): 10 mg via ORAL
  Filled 2016-08-10: qty 1
  Filled 2016-08-10: qty 2

## 2016-08-10 MED ORDER — HYDROMORPHONE HCL 1 MG/ML IJ SOLN
0.5000 mg | Freq: Once | INTRAMUSCULAR | Status: AC
  Administered 2016-08-10: 0.5 mg via INTRAVENOUS
  Filled 2016-08-10: qty 1

## 2016-08-10 MED ORDER — PNEUMOCOCCAL VAC POLYVALENT 25 MCG/0.5ML IJ INJ
0.5000 mL | INJECTION | INTRAMUSCULAR | Status: AC
  Administered 2016-08-11: 0.5 mL via INTRAMUSCULAR
  Filled 2016-08-10: qty 0.5

## 2016-08-10 MED ORDER — NITROGLYCERIN 0.4 MG SL SUBL: 0.4000 mg | SUBLINGUAL_TABLET | SUBLINGUAL | Status: DC | PRN

## 2016-08-10 MED ORDER — METOPROLOL TARTRATE 50 MG PO TABS
50.0000 mg | ORAL_TABLET | Freq: Two times a day (BID) | ORAL | Status: DC
  Administered 2016-08-10 – 2016-08-11 (×3): 50 mg via ORAL
  Filled 2016-08-10: qty 2
  Filled 2016-08-10 (×2): qty 1

## 2016-08-10 NOTE — ED Notes (Signed)
Report attempted no answer x 2  

## 2016-08-10 NOTE — ED Notes (Signed)
Report attempted again and was told RN will call this RN back "in 5 minutes."

## 2016-08-10 NOTE — ED Provider Notes (Signed)
[facility] DEPT Provider Note   CSN: [id number] Arrival date & time: 08/10/16  0830     History   Chief Complaint Chief Complaint  Patient presents with  . Chest Pain    HPI Mattheu Weigelt is a 61 y.o. [place].  HPI   61 year old Laster with extensive past medical history as below including known coronary disease, DVT, hypertension, hyperlipidemia, who presents with right-sided chest pain. The patient states that shortly after waking this morning, he sat up and tried to start his day when he developed acute, dull, pressure-like right-sided chest pain. He had associated shortness of breath and diaphoresis. He has a history of similar complaints and states that it does feel like the chest pain that he has had with ACS in the past. He denies any cough or shortness of breath. He called EMS and received aspirin as well as nitroglycerin. He reports relief of his pain with nitroglycerin. Otherwise, he does note that he has been out of his medications for 2 days as well because the pharmacy "doesn't have it." Of note, he did have abnormal stress test in March but has not followed up.  Past Medical History:  Diagnosis Date  . Arthritis    "right leg" (08/22/2014)  . CAD (coronary artery disease)    a. CAD s/p 2 stent placement in [place] 2016. b. Neg nuc 05/2014 & 01/2016.  Marland Kitchen CKD (chronic kidney disease), stage IV (HCC)    [name] 08/22/2014  . Daily headache   . Depression    "I always get that" (08/22/2014)  . Diabetes mellitus (HCC)   . DVT (deep venous thrombosis) (HCC) ?   RLE  . Heart murmur   . Hypercholesterolemia   . Hypertension     Patient Active Problem List   Diagnosis Date Noted  . Anemia of chronic renal failure   . Secondary hyperparathyroidism of renal origin (HCC)   . Proteinuria   . Arthritis   . Depression   . Deep vein thrombosis (HCC)   . Myocardial infarction (HCC)   . Heart failure, unspecified (HCC)   . Heart murmur   . Prediabetes 02/22/2015  . Chronic  kidney disease, stage IV (severe) (HCC)   . Hyperlipidemia 05/22/2014  . Chest pain 05/21/2014  . Hypokalemia 05/21/2014  . CAD (coronary artery disease) 05/21/2014  . Hypertension 05/21/2014    Past Surgical History:  Procedure Laterality Date  . CORONARY ANGIOPLASTY WITH STENT PLACEMENT     "1 + 1"       Home Medications    Prior to Admission medications   Medication Sig Start Date End Date Taking? Authorizing Provider  amLODipine (NORVASC) 10 MG tablet TAKE ONE (1) TABLET BY MOUTH EVERY DAY Patient taking differently: Take 20 mg by mouth daily.  02/10/16  Yes Massie Maroon, FNP  aspirin 81 MG EC tablet Take 1 tablet (81 mg total) by mouth daily. Swallow whole. 02/10/16  Yes Massie Maroon, FNP  atorvastatin (LIPITOR) 40 MG tablet TAKE ONE TABLET BY MOUTH EVERY EVENING AT SIX P.M. Patient taking differently: Take 40 mg by mouth daily.  02/10/16  Yes Massie Maroon, FNP  citalopram (CELEXA) 40 MG tablet Take 40 mg by mouth daily.   Yes [provider]  cloNIDine (CATAPRES) 0.2 MG tablet TAKE ONE (1) TABLET BY MOUTH TWO (2) TIMES DAILY Patient taking differently: Take 0.2 mg by mouth daily.  02/10/16  Yes Massie Maroon, FNP  isosorbide mononitrate (IMDUR) 30 MG 24 hr tablet Take  1 tablet (30 mg total) by mouth daily. 02/10/16  Yes Massie Maroon, FNP  metoprolol (LOPRESSOR) 50 MG tablet TAKE ONE (1) TABLET BY MOUTH TWO (2) TIMES DAILY Patient taking differently: Take 50 mg by mouth daily.  02/10/16  Yes Massie Maroon, FNP  PRESCRIPTION MEDICATION Place into both eyes See admin instructions. 2 different eye drops from [facility] - instill 1 drop of each into both eyes every morning   Yes [provider]  glycerin adult 2 g suppository Place 1 suppository rectally as needed for constipation. 07/17/16   Lindalou Hose, MD  nitroGLYCERIN (NITROSTAT) 0.4 MG SL tablet DISSOLVE 1 TABLET UNDER THE TONGUE AS NEEDED FOR CHEST PAIN. REPEAT AS NEEDED  EVERY 5 MINUTES UP TO A TOTAL OF 3 DOSES 12/15/15   Henrietta Hoover, NP    Family History Family History  Problem Relation Age of Onset  . Hypertension Mother   . Kidney disease Mother   . Heart disease Mother   . Heart disease Father   . Hypertension Father     Social History Social History  Substance Use Topics  . Smoking status: Former Smoker    Packs/day: 0.00    Years: 40.00    Types: Cigarettes    Quit date: 08/29/2010  . Smokeless tobacco: Never Used     Comment: "quit smoking cigarettes in ~ 2011"  . Alcohol use No     Comment: 08/22/2014 "stopped in ~ 2011"     Allergies   Penicillins   Review of Systems Review of Systems  Constitutional: Positive for fatigue.  Respiratory: Positive for chest tightness.   Cardiovascular: Positive for chest pain and leg swelling.  Neurological: Positive for weakness.  All other systems reviewed and are negative.    Physical Exam Updated Vital Signs BP 136/85 (BP Location: Right Arm)   Pulse (!) 57   Temp 97.6 F (36.4 C) (Oral)   Resp 12   Ht 5\' 7"  (1.702 m)   Wt 100.5 kg (221 lb 9 oz)   SpO2 100%   BMI 34.70 kg/m   Physical Exam  Constitutional: He is oriented to person, place, and time. He appears well-developed and well-nourished. No distress.  HENT:  Head: Normocephalic and atraumatic.  Mouth/Throat: Oropharynx is clear and moist.  Eyes: Conjunctivae are normal.  Neck: Neck supple.  Cardiovascular: Normal rate, regular rhythm and normal heart sounds.  Exam reveals no friction rub.   No murmur heard. Pulmonary/Chest: Effort normal and breath sounds normal. No respiratory distress. He has no wheezes. He has no rales.  Abdominal: He exhibits no distension.  Musculoskeletal: He exhibits edema (Trace, bilateral pitting).  Neurological: He is alert and oriented to person, place, and time. He exhibits normal muscle tone.  Skin: Skin is warm. Capillary refill takes less than 2 seconds.  Psychiatric: He has a  normal mood and affect.  Nursing note and vitals reviewed.    ED Treatments / Results  Labs (all labs ordered are listed, but only abnormal results are displayed) Labs Reviewed  BASIC METABOLIC PANEL - Abnormal; Notable for the following:       Result Value   CO2 21 (*)    Glucose, Bld 124 (*)    BUN 27 (*)    Creatinine, Ser 3.23 (*)    Calcium 8.6 (*)    GFR calc non Af Amer 19 (*)    GFR calc Af Amer 22 (*)    All other components within normal limits  CBC - Abnormal; Notable for the following:    RBC 4.20 (*)    Hemoglobin 11.9 (*)    HCT 36.8 (*)    All other components within normal limits  MRSA PCR SCREENING  TROPONIN I  BRAIN NATRIURETIC PEPTIDE  RAPID HIV SCREEN (HIV 1/2 AB+AG)  RPR  TROPONIN I  TROPONIN I  TROPONIN I  I-STAT TROPOININ, ED    EKG  EKG Interpretation  Date/Time:  Tuesday August 10 2016 08:34:46 EDT Ventricular Rate:  60 PR Interval:    QRS Duration: 78 QT Interval:  411 QTC Calculation: 411 R Axis:   19 Text Interpretation:  Sinus rhythm Nonspecific T abnormalities, inferior leads Since last EKG, artifact is improved Compared to prior EKGs, no significant change with unchanged inferolateral TWI Confirmed by Shaune Pollack [phone]) on 08/10/2016 8:37:43 AM Also confirmed by Shaune Pollack [phone]), editor Misty Stanley [phone])  on 08/10/2016 9:38:31 AM       Radiology Dg Chest 2 View  Result Date: 08/10/2016 CLINICAL DATA:  RIGHT-sided chest pain this morning, hypertension, diabetes mellitus EXAM: CHEST  2 VIEW COMPARISON:  01/10/2016 FINDINGS: Upper normal heart size. Normal mediastinal contours and pulmonary vascularity. Lungs clear. No acute infiltrate, pleural effusion or pneumothorax. Bones unremarkable. IMPRESSION: No acute abnormalities. Electronically Signed   By: Ulyses Southward M.D.   On: 08/10/2016 09:10    Procedures Procedures (including critical care time)  Medications Ordered in ED Medications  nitroGLYCERIN (NITROSTAT)  SL tablet 0.4 mg (not administered)  amLODipine (NORVASC) tablet 10 mg (10 mg Oral Given 08/10/16 1154)  atorvastatin (LIPITOR) tablet 40 mg (not administered)  metoprolol tartrate (LOPRESSOR) tablet 50 mg (50 mg Oral Given 08/10/16 1155)  heparin injection 5,000 Units (not administered)  citalopram (CELEXA) tablet 40 mg (not administered)  cloNIDine (CATAPRES) tablet 0.2 mg (not administered)  pneumococcal 23 valent vaccine (PNU-IMMUNE) injection 0.5 mL (not administered)  isosorbide mononitrate (IMDUR) 24 hr tablet 30 mg (not administered)  HYDROmorphone (DILAUDID) injection 0.5 mg (0.5 mg Intravenous Given 08/10/16 1017)  nitroGLYCERIN (NITROGLYN) 2 % ointment 0.5 inch (0.5 inches Topical Given 08/10/16 1015)     Initial Impression / Assessment and Plan / ED Course  I have reviewed the triage vital signs and the nursing notes.  Pertinent labs & imaging results that were available during my care of the patient were reviewed by me and considered in my medical decision making (see chart for details).     61 year old Tarvis here with high risk chest pain, resolved with nitroglycerin. On arrival, patient is hypertensive and has been nonadherent with his meds. Initial lab work is overall reassuring with baseline CK D. However, upon review of records, patient does have abnormal stress test in March and has not followed up for this. Given negative troponin, some of this may be hypertensive urgency with subsequent angina, but will admit for high-risk chest pain evaluation. Of note, patient has seen [facility] cardiology in the past but is requesting admission at [facility].  Final Clinical Impressions(s) / ED Diagnoses   Final diagnoses:  Atypical chest pain  Chronic kidney disease (CKD), stage IV (severe) [facility])    New Prescriptions Current Discharge Medication List       Shaune Pollack, MD 08/10/16 1620

## 2016-08-10 NOTE — Consult Note (Addendum)
The patient has been seen in conjunction with Richard Page, NP. All aspects of care have been considered and discussed. The patient has been personally interviewed, examined, and all clinical data has been reviewed.   Unable to obtain any useful history from the patient other than right upper quadrant/right lower chest discomfort. The discomfort is very atypical for ischemic pain. Prior history of stents without any detail. He cannot remember what they had been placed in [place] or [place]. Medication compliance is poor.  Review of available records indicates multiple prior nuclear studies which have been normal except for a report of an abnormal nuclear study performed in 2018 at [facility]. It is difficult for [place] to tell if he follows regularly there or not. He has stage IV chronic kidney disease and is predialysis. Records suggest that coronary angiography was offered recently but he refused and chose medical therapy.  Blood pressure is elevated, and S4 gallop is audible, and his EKG is abnormal with sinus rhythm, LVH with strain, and secondary T-wave abnormality.  Atypical chest pain, not felt to represent myocardial ischemia.  Our plan is a conservative approach unless enzymes become abnormal. No functional testing is ordered at this time. Control blood pressure. Will follow with you.  Consider nephrology consult    Cardiology Consult    Patient ID: Richard Barnett MRN: [id number], DOB/AGE: 07-13-1955   Admit date: 08/10/2016 Date of Consult: 08/10/2016  Primary Physician: Richard Maroon, FNP Primary Cardiologist: Richard Barnett Requesting Provider: Catha Barnett Reason for Consultation: Chest pain  Richard Barnett is a 61 y.o. [name] who is being seen today for the evaluation of chest pain at the request of Richard Barnett.   Patient Profile    61 yo Richard Barnett with PMH of CAD s/p 2 stents (placed previous while in [place], 2013), CKD stage IV, DVT, HTN, and HL who presented with chest pain.    Past Medical History   Past Medical History:  Diagnosis Date  . Arthritis    "right leg" (08/22/2014)  . CAD (coronary artery disease)    a. CAD s/p 2 stent placement in [place] 2016. b. Neg nuc 05/2014 & 01/2016.  Marland Kitchen CKD (chronic kidney disease), stage IV (HCC)    Richard Barnett 08/22/2014  . Daily headache   . Depression    "I always get that" (08/22/2014)  . Diabetes mellitus (HCC)   . DVT (deep venous thrombosis) (HCC) ?   RLE  . Heart murmur   . Hypercholesterolemia   . Hypertension     Past Surgical History:  Procedure Laterality Date  . CORONARY ANGIOPLASTY WITH STENT PLACEMENT     "1 + 1"     Allergies  Allergies  Allergen Reactions  . Penicillins Swelling and Rash    Swelling of whole body  Has patient had a PCN reaction causing immediate rash, facial/tongue/throat swelling, SOB or lightheadedness with hypotension: No Has patient had a PCN reaction causing severe rash involving mucus membranes or skin necrosis: No Has patient had a PCN reaction that required hospitalization No Has patient had a PCN reaction occurring within the last 10 years: No If all of the above answers are "NO", then may proceed with Cephalosporin use.     History of Present Illness    61 yo Richard Barnett with PMH of CAD s/p 2 stents (placed previous while in [place], 2016), CKD stage IV, DVT, HTN, and HL. Reports he was living in [place] and had 2 stents placed back in 2013, states they were several months  apart. Was on ASA and plavix after having those placed. States he took this for a year, but has documented issues with compliance. Was first seen by Richard Barnett in 2016 when he presented with chest pain. Stress test was done that showed no reversible ischemia, with EF of 56%. Again seen as inpatient for chest pain in 12/16 by Richard Barnett, and started on Imdur. Scheduled follow up in the office but he did not follow up. Reportedly stopped smoking in 2009, and had early family hx of CAD with mother having MI at 18,  and father at 43, both deceased.   Seen last 01/24/2023 while inpatient for upper sternal chest pain while at rest. Trop neg, BNP was normal. Underwent stress test that was low risk, with EF of 52%. Imdur was further increased from 15mg  to 30mg  daily. Did not follow up with Richard Barnett. Has since been seen a Richard Barnett. Was seen in the office by Richard Barnett Richard Barnett) for an abnormal stress test, and was referred for cardiac cath. Appears he wanted to pursue medical therapy at that time. Currently in the process of possible work up for kidney transplant through Richard Barnett. Not on HD, but says they are prepping him.   Reports this morning around 6am he was laying on the couch and developed right sided upper abd/rib area pain. Stated a 10/10 and did not resolve. He became concerned and called EMS. Was given 2L nitro which reduced his pain to a 1/10. States symptoms with previous stents included left sided chest pain with radiation into the left arm, which are very different from these symptoms. Labs thus far trop neg x2, stable electrolytes, Cr 3.23. EKG showed SR with TWI in inferolateral leads noted on previous ekgs. BNP normal. CXR negative.   Inpatient Medications    . amLODipine  10 mg Oral Daily  . atorvastatin  40 mg Oral q1800  . citalopram  40 mg Oral Daily  . cloNIDine  0.2 mg Oral BID  . heparin  5,000 Units Subcutaneous Q8H  . metoprolol tartrate  50 mg Oral BID    Family History    Family History  Problem Relation Age of Onset  . Hypertension Mother   . Kidney disease Mother   . Heart disease Mother   . Heart disease Father   . Hypertension Father     Social History    Social History   Social History  . Marital status: Divorced    Spouse name: N/A  . Number of children: N/A  . Years of education: N/A   Occupational History  . Not on file.   Social History Main Topics  . Smoking status: Former Smoker    Packs/day: 0.00    Years: 40.00    Types: Cigarettes    Quit date:  08/29/2010  . Smokeless tobacco: Never Used     Comment: "quit smoking cigarettes in ~ 2011"  . Alcohol use No     Comment: 08/22/2014 "stopped in ~ 2011"  . Drug use: No     Comment: 08/22/2014 "stopped in ~ 2011; all types of drugs""  . Sexual activity: No   Other Topics Concern  . Not on file   Social History Narrative  . No narrative on file     Review of Systems    See HPI All other systems reviewed and are otherwise negative except as noted above.  Physical Exam    Blood pressure 136/85, pulse (!) 57, temperature 97.6 F (36.4 C), temperature source  Oral, resp. rate 12, height 5\' 7"  (1.702 m), weight 221 lb 9 oz (100.5 kg), SpO2 100 %.  General: Pleasant obese AAM, NAD Psych: Normal affect. Neuro: Alert and oriented X 3. Moves all extremities spontaneously. HEENT: Normal  Neck: Supple without bruits or JVD. Lungs:  Resp regular and unlabored, CTA. Heart: RRR no s3, s4, or murmurs. Abdomen: Soft, non-tender, non-distended, BS + x 4.  Extremities: No clubbing, cyanosis or edema. DP/PT/Radials 2+ and equal bilaterally.  Labs    Troponin [facility] Test)  Recent Labs  08/10/16 0906  TROPIPOC 0.02    Recent Labs  08/10/16 0846 08/10/16 1129  TROPONINI <0.03 <0.03   Lab Results  Component Value Date   WBC 6.0 08/10/2016   HGB 11.9 (L) 08/10/2016   HCT 36.8 (L) 08/10/2016   MCV 87.6 08/10/2016   PLT 299 08/10/2016    Recent Labs Lab 08/10/16 0846  NA 141  K 3.9  CL 110  CO2 21*  BUN 27*  CREATININE 3.23*  CALCIUM 8.6*  GLUCOSE 124*   Lab Results  Component Value Date   CHOL 137 01/11/2016   HDL 34 (L) 01/11/2016   LDLCALC 79 01/11/2016   TRIG 118 01/11/2016   Lab Results  Component Value Date   DDIMER 0.91 (H) 02/25/2015     Radiology Studies    Dg Chest 2 View  Result Date: 08/10/2016 CLINICAL DATA:  RIGHT-sided chest pain this morning, hypertension, diabetes mellitus EXAM: CHEST  2 VIEW COMPARISON:  01/10/2016 FINDINGS: Upper  normal heart size. Normal mediastinal contours and pulmonary vascularity. Lungs clear. No acute infiltrate, pleural effusion or pneumothorax. Bones unremarkable. IMPRESSION: No acute abnormalities. Electronically Signed   By: Ulyses Southward M.D.   On: 08/10/2016 09:10    ECG & Cardiac Imaging    EKG: SR with known TWI in inferolateral leads  Echo: 3/16  Study Conclusions  - Left ventricle: The cavity size was normal. There was mild concentric hypertrophy. 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 Systolic function was normal. The estimated ejection fraction was in the range of 55% to 60%. Wall motion was normal; there were no regional wall motion abnormalities. Doppler parameters are consistent with abnormal left ventricular relaxation (grade 1 diastolic dysfunction). - Aortic valve: There was trivial regurgitation. - Left atrium: The atrium was mildly dilated. - Pulmonary arteries: Systolic pressure was mildly increased. PA peak pressure: 36 mm Hg (S).  Assessment & Plan    61 yo Richard Barnett with PMH of CAD s/p 2 stents (placed previous while in [place], 2013), CKD stage IV, DVT, HTN, and HL who presented with chest pain.   1. Chest pain: Symptoms are atypical in nature and not similar to those he experienced with previous stents placed in 2013. Trop neg x2, EKG with no acute changes. No current chest pain. Reportedly had an abnormal stress back in 3/18, and suggested to have a cath, but patient preferred medical therapy.  -- Given advanced CKD, cath would likely lead to HD. States he is being prepped for HD, but has not started yet. No fistula placed. Does not seem that he understand the process of HD, and lifelong therapy. At this time seems reasonable to proceed with medical therapy, as his enzymes are negative and EKG without acute ischemia.  -- continue BB, statin, amlodipine, and would consider the addition of ASA.  Would -- restart home Imdur for antianginal therapy.   2. CKD IV: Following intermittently  with nephrology through [facility].   3. HTN: continue BB, amlodipine.  -- no ACEi/ARB with CKD  4. HL: On statin    Leonides Schanz Richard Page, NP-C Pager [phone] 08/10/2016, 2:55 PM

## 2016-08-10 NOTE — ED Triage Notes (Signed)
Pt arrives from home viaq [facility] reporting R CP, denies n/v, SOB, dizziness.  EMS reports giving 324 ASA and NTG x2, with pain relief.  Resp e/u, pt reports pain 1/10 at this time.

## 2016-08-10 NOTE — H&P (Signed)
Date: 08/10/2016               Patient Name:  Richard Barnett MRN: [id number]  DOB: November 16, 1955 Age / Sex: 61 y.o., Real   PCP: Massie Maroon, FNP         Medical Service: Internal Medicine Teaching Service         Attending Physician: Dr. Oswaldo Done, Marquita Palms, *    First Contact: Dr. Ladona Ridgel Pager: [phone]  Second Contact: Dr. Allena Katz Pager: [phone]       After Hours (After 5p/  First Contact Pager: [phone]  weekends / holidays): Second Contact Pager: [phone]   Chief Complaint: chest pain  History of Present Illness: Richard Barnett is a 61 year old man with history of CAD s/p PCI, CKD Stage IV with proteinuria, hyperlipidemia, hypertension, depression, h/o DVT/PE who presented to the ED this morning with right-sided chest pain. Upon awakening, he felt 10/10 pain and was instructed to take aspirin by the operator. His pain worsened en route but resolved upon arrival in the ED though he developed another pain in his left chest. He describes both as sharp and feel they limits his ability to take deep breaths but doesn't change with palpation. He had a heart attack about 2 years ago of which he cannot recall the symptoms since "it happened in my sleep" for which he underwent two stent placements. He has been out of his medications, notably his anti-hypertensives, over the last 2 days. He denies any leg swelling, exertional dyspnea, orthopnea, wheezing, dizziness, syncope. His father died suddenly in the kitchen and is unsure if it was related to his heart. He spends most of his day watching TV and quit smoking [2 packs/day x 20 years], alcohol, and illicit drug use 15 years ago.  He follows at [facility] with cardiology and nephrology and was undergoing evaluation for a kidney transplant at [facility] Per review of their records, he had an abnormal stress test in March 2018 though did not follow-up thereafter. Most recent echo on file 05/22/14 notes EF 55-60%, left atrial dilatation, grade 1 diastolic  dysfunction, mildly elevated pulmonary artery pressure 36 mm Hg. He also recalls having   Per EMS, he received 324 mg en route, and in the ED, his initial BP was 160/112. He received hydomorphone IV 0.5 mg. Initial troponin was negative.   Meds:  Current Meds  Medication Sig  . amLODipine (NORVASC) 10 MG tablet TAKE ONE (1) TABLET BY MOUTH EVERY DAY (Patient taking differently: Take 20 mg by mouth daily. )  . aspirin 81 MG EC tablet Take 1 tablet (81 mg total) by mouth daily. Swallow whole.  Marland Kitchen atorvastatin (LIPITOR) 40 MG tablet TAKE ONE TABLET BY MOUTH EVERY EVENING AT SIX P.M. (Patient taking differently: Take 40 mg by mouth daily. )  . citalopram (CELEXA) 40 MG tablet Take 40 mg by mouth daily.  . cloNIDine (CATAPRES) 0.2 MG tablet TAKE ONE (1) TABLET BY MOUTH TWO (2) TIMES DAILY (Patient taking differently: Take 0.2 mg by mouth daily. )  . isosorbide mononitrate (IMDUR) 30 MG 24 hr tablet Take 1 tablet (30 mg total) by mouth daily.  . metoprolol (LOPRESSOR) 50 MG tablet TAKE ONE (1) TABLET BY MOUTH TWO (2) TIMES DAILY (Patient taking differently: Take 50 mg by mouth daily. )  . PRESCRIPTION MEDICATION Place into both eyes See admin instructions. 2 different eye drops from [facility] - instill 1 drop of each into both eyes every morning  Allergies: Allergies as of 08/10/2016 - Review Complete 08/10/2016  Allergen Reaction Noted  . Penicillins Swelling and Rash 05/21/2014   Past Medical History:  Diagnosis Date  . Arthritis    "right leg" (08/22/2014)  . CAD (coronary artery disease)    a. CAD s/p 2 stent placement in [place] 2016. b. Neg nuc 05/2014 & 01/2016.  Marland Kitchen CKD (chronic kidney disease), stage IV (HCC)    [name] 08/22/2014  . Daily headache   . Depression    "I always get that" (08/22/2014)  . Diabetes mellitus (HCC)   . DVT (deep venous thrombosis) (HCC) ?   RLE  . Heart murmur   . Hypercholesterolemia   . Hypertension     Family History: As noted in the  HPI  Social History: As noted in the HPI  Review of Systems: A complete ROS was negative except as per HPI.   Physical Exam: Blood pressure (!) 183/97, pulse (!) 56, temperature 98.3 F (36.8 C), temperature source Oral, resp. rate 13, SpO2 99 %. Physical Exam  Constitutional: He is oriented to person, place, and time. No distress.  HENT:  Head: Normocephalic and atraumatic.  Eyes: Conjunctivae are normal. No scleral icterus.  Cardiovascular: Normal rate and normal heart sounds.   Pulmonary/Chest: Effort normal. No respiratory distress. He has no wheezes.  Speaking full sentences.  Abdominal: Bowel sounds are normal. He exhibits no distension.  Musculoskeletal: He exhibits edema (2+ pitting edema of bilateral LE).  Neurological: He is alert and oriented to person, place, and time.  Skin: Skin is warm. He is diaphoretic.   EKG: I reviewed and compared with 01/10/16. -Normal sinus rhythm and axis -T-wave inversions in II, III, aVF, V5, V6 which are stable. -P wave broadening in II suggestive of atrial enlargement  CXR: I reviewed and compared with 01/10/16. No infiltrate or consolidation noted.  Assessment & Plan by Problem: Principal Problem:   Chest pain Active Problems:   CAD (coronary artery disease)   Hypertension   Hyperlipidemia   Chronic kidney disease, stage IV (severe) (HCC)   Depression  Richard Barnett is a 61 year old man with history of CAD s/p PCI, CKD Stage IV with proteinuria, hyperlipidemia, hypertension, depression who presented to the ED this morning with atypical chest pain in the setting of hypertensive urgency.  Atypical chest pain: He is at high risk for ACS given prior CAD, recent abnormal stress test within last 6 months, poorly controlled blood pressure though initial troponin and EKG findings are reassuring [HEART score 5 with risk of MACE 12-16.6%]. Outside of being sedentary and having a prior history of DVT/PE, he has no other risk factors of DVT/PE  which stratifies as him as low risk by Well's score of 1; he is not hypoxic or tachycardic which is reassuring as well. CXR without any findings to account for his pain as well. No reproducibility to suggest musculoskeletal causes. -Monitor on telemetry -Check troponins x 3 -Repeat EKG in AM -Continue nitroglycerin sublingual prn for chest pain -Continue aspirin 81 mg -Consult cardiology for additional management though suspect cardiac catheterization will be risky given his chronic renal insufficiency -Consider D-dimer if his initial workup is unrevealing  Hypertensive urgency: Resume home amlodipine 5 mg, clonidine 0.2 mg twice daily, metoprolol as noted above.  CAD s/p PCI: Continue aspirin 81 mg, metoprolol 50 mg twice daily, atorvastatin 40 mg. -Holding Imdur given that he already received nitrates today  CKD Stage IV with proteinuria: Crt 3.2 on admission, baseline 2.7-3.7 since  January 2017. Unclear why he is not on ACE/ARB. -Follow electrolytes  Normocytic anemia: Hb 11-12 since December 2016 with MCV 87-90. Suspect it is secondary to chronic renal insufficiency.  Hyperlipidemia: Atorvastatin as noted above.  Depression: Continue citalopram 40 mg.  #FEN:  -Diet: NPO pending Cardiology recommendations  #DVT prophylaxis: heparin  #CODE STATUS: FULL CODE -Defer to daughter Myrene Buddy [phone] if patients lacks decision-making capacity -Confirmed with patient on admission  Dispo: Admit patient to Observation with expected length of stay less than 2 midnights.  Signed: Beather Arbour, MD 08/10/2016, 11:27 AM  Pager: [phone]

## 2016-08-10 NOTE — H&P (Signed)
Date: 08/10/2016               Patient Name:  Richard Barnett MRN: [id number]  DOB: 1956-01-12 Age / Sex: 61 y.o., Richard Barnett   PCP: Massie Maroon, FNP              Medical Service: Internal Medicine Teaching Service              Attending Physician: Dr. Oswaldo Done, Marquita Palms, *    First Contact: Eppie Gibson, MS 3 Pager: [phone]  Second Contact: Dr. Ladona Ridgel Pager: [phone]  Third Contact Dr. Allena Katz Pager: [phone]       After Hours (After 5p/  First Contact Pager: [phone]  weekends / holidays): Second Contact Pager: [phone]   Chief Complaint: Chest pain  History of Present Illness:  Richard Barnett woke up this morning with pain in his chest on the right side.  He described the pain as a severe pressure, inconsistent with past CAD pain.  He reported associated shortness of breath and said that he was unable to take a deep breath without causing pain.  He also reports diaphoresis.  He called EMS and was taken to the ED.  He received 324mg  aspirin and nitroglycerin en route, which helped alleviate the pain.    Upon arrival, his chest pain shifted from the right to left side.  BP on admission was 156/102 and peaked at 192/107.  He received 0.5mg  IV hydromorphone.  Troponin was negative.  EKG was unchanged when compared to previous.  He reports feeling better now compared to arrival but says that he has pain if he breathes in deeply.   Meds: Current Facility-Administered Medications  Medication Dose Route Frequency Provider Last Rate Last Dose  . amLODipine (NORVASC) tablet 10 mg  10 mg Oral Daily Heywood Iles V, MD   10 mg at 08/10/16 1154  . atorvastatin (LIPITOR) tablet 40 mg  40 mg Oral q1800 Beather Arbour, MD      . citalopram (CELEXA) tablet 40 mg  40 mg Oral Daily Patel, Rushil V, MD      . cloNIDine (CATAPRES) tablet 0.2 mg  0.2 mg Oral BID Heywood Iles V, MD      . heparin injection 5,000 Units  5,000 Units Subcutaneous Q8H Patel, Rushil V, MD      . isosorbide mononitrate (IMDUR)  24 hr tablet 30 mg  30 mg Oral Daily Laverda Page B, NP      . metoprolol tartrate (LOPRESSOR) tablet 50 mg  50 mg Oral BID Heywood Iles V, MD   50 mg at 08/10/16 1155  . nitroGLYCERIN (NITROSTAT) SL tablet 0.4 mg  0.4 mg Sublingual Q5 min PRN Shaune Pollack, MD      . Melene Muller ON 08/11/2016] pneumococcal 23 valent vaccine (PNU-IMMUNE) injection 0.5 mL  0.5 mL Intramuscular Tomorrow-1000 Tyson Alias, MD      . senna-docusate (Senokot-S) tablet 2 tablet  2 tablet Oral BID Darreld Mclean, MD        Allergies: Allergies as of 08/10/2016 - Review Complete 08/10/2016  Allergen Reaction Noted  . Penicillins Swelling and Rash 05/21/2014   Past Medical History:  Diagnosis Date  . Arthritis    "right leg" (08/22/2014)  . CAD (coronary artery disease)    a. CAD s/p 2 stent placement in [place] 2016. b. Neg nuc 05/2014 & 01/2016.  Marland Kitchen CKD (chronic kidney disease), stage IV (HCC)    [name] 08/22/2014  . Daily headache   . Depression    "  I always get that" (08/22/2014)  . Diabetes mellitus (HCC)   . DVT (deep venous thrombosis) (HCC) ?   RLE  . Heart murmur   . Hypercholesterolemia   . Hypertension    Past Surgical History:  Procedure Laterality Date  . CORONARY ANGIOPLASTY WITH STENT PLACEMENT     "1 + 1"   Family History  Problem Relation Age of Onset  . Hypertension Mother   . Kidney disease Mother   . Heart disease Mother   . Heart disease Father   . Hypertension Father    Social History   Social History  . Marital status: Divorced    Spouse name: N/A  . Number of children: N/A  . Years of education: N/A   Occupational History  . Not on file.   Social History Main Topics  . Smoking status: Former Smoker    Packs/day: 0.00    Years: 40.00    Types: Cigarettes    Quit date: 08/29/2010  . Smokeless tobacco: Never Used     Comment: "quit smoking cigarettes in ~ 2011"  . Alcohol use No     Comment: 08/22/2014 "stopped in ~ 2011"  . Drug use: No     Comment:  08/22/2014 "stopped in ~ 2011; all types of drugs""  . Sexual activity: No   Other Topics Concern  . Not on file   Social History Narrative  . No narrative on file    Review of Systems: Constitutional: no fever, chills Respiratory: positive for dyspnea and SOB Cardiovascular: no palpitations Gastrointestinal: no nausea, vomiting; normal BM Extremities: positive for swelling in LLE Musculoskeletal: no cramps, weakness Endocrine: positive diaphoresis during chest pain  Physical Exam: Blood pressure 136/85, pulse (!) 57, temperature 97.6 F (36.4 C), temperature source Oral, resp. rate 12, height 5\' 7"  (1.702 m), weight 100.5 kg (221 lb 9 oz), SpO2 100 %.   General appearance: alert, cooperative and no distress Lungs: clear to auscultation bilaterally Heart: bradycardic, S1, S2 normal, S4 gallop Abdomen: soft, non-tender; bowel sounds normal; no masses,  no organomegaly Extremities: mild edema on lower extremities.   Pulses: 2+ and symmetric Skin: Skin color, texture, turgor normal. No rashes or lesions  Lab results: CBC Latest Ref Rng & Units 08/10/2016 07/17/2016 01/10/2016  WBC 4.0 - 10.5 K/uL 6.0 5.6 5.7  Hemoglobin 13.0 - 17.0 g/dL 11.9(L) 12.1(L) 11.8(L)  Hematocrit 39.0 - 52.0 % 36.8(L) 37.4(L) 35.5(L)  Platelets 150 - 400 K/uL 299 269 229   BMP Latest Ref Rng & Units 08/10/2016 07/17/2016 01/10/2016  Glucose 65 - 99 mg/dL 454(U) 981(X) 914(N)  BUN 6 - 20 mg/dL 82(N) 56(O) 13(Y)  Creatinine 0.61 - 1.24 mg/dL 8.65(H) 8.46(N) 6.29(B)  Sodium 135 - 145 mmol/L 141 140 135  Potassium 3.5 - 5.1 mmol/L 3.9 4.4 3.2(L)  Chloride 101 - 111 mmol/L 110 109 104  CO2 22 - 32 mmol/L 21(L) 22 23  Calcium 8.9 - 10.3 mg/dL 2.8(U) 8.9 1.3(K)   Imaging results:  Dg Chest 2 View  Result Date: 08/10/2016 CLINICAL DATA:  RIGHT-sided chest pain this morning, hypertension, diabetes mellitus EXAM: CHEST  2 VIEW COMPARISON:  01/10/2016 FINDINGS: Upper normal heart size. Normal mediastinal contours  and pulmonary vascularity. Lungs clear. No acute infiltrate, pleural effusion or pneumothorax. Bones unremarkable. IMPRESSION: No acute abnormalities. Electronically Signed   By: Ulyses Southward M.D.   On: 08/10/2016 09:10    Other results: EKG:  -Normal sinus rhythm and axis -T-wave inversions in II, III, aVF, V5,  V6 which are stable. -P wave broadening in II suggestive of atrial enlargement  Assessment & Plan by Problem: Principal Problem:   Chest pain Active Problems:   CAD (coronary artery disease)   Hypertension   Hyperlipidemia   Chronic kidney disease, stage IV (severe) (HCC)   Depression   Mr. Vences is a 61 yo man with a history of CAD s/p PCI in 2016, stage IV CKD, HTN, HLD, and depression who presents with chest pain.    Chest Pain: Patient is at high risk for CAD based on history of CAD requiring intervention in addition to HTN and HLD.  HEART score is 5, indicative of 12-16.6% chance of major cardiac event.  He has not taken his BP medications for 2 days due to medications not being delivered.  The differential for his chest pain includes MI, unstable angina, PE, gastritis, and costochondritis.  Unstable angina is high on the differential due to his history of CAD, chest pain without exertion, negative biomarkers, and alleviation of pain with nitroglycerin.  MI is less likely due to the location of the pain, EKG findings, and normal troponin levels.  Although he has a history of DVT, PE is unlikely due to a Wells score of 1.5.  Gastritis is considered due to a history of ulcers, but is lower on the differential due to the alleviation of pain with nitro.  Costochondritis is also considered due to the bilateral nature of the pain but is inconsistent with pain description and non-reproducibility. - Repeat EKG tomorrow - Monitor on telemetry - Continue ASA 81mg  daily - Continue metoprolol tartrate 50mg  BID - Continue clonidine 0.2mg  BID - Continue amlodipine 10mg  daily - Continue  atorvastatin 40mg  daily - Nitroglycerin PRN for chest pain - Continue consult with cardiology   CKD Stave IV: Patient has a history of CKD stage IV.  He says that he is supposed to start hemodialysis this Saturday 6/9.  Creatinine on admission is 3.2 with baseline of 2.7-3.7.   - Monitor electrolytes - Possibly consult nephrology   Hypertension: Patient has a history of HTN managed with multiple medications.  He has not taken his BP medications for 2 days due to running out. - Continue metoprolol tartrate 50mg  BID - Continue clonidine 0.2mg  BID - Continue amlodipine 10mg  daily  Hyperlipidemia: Patient has a history of HLD managed with atorvastatin - Continue atorvastatin 40mg  daily  Depression: Patient has a history of depression controlled with citalopram. - Continue citalopram 40mg  daily  FEN: heart healthy diet DVT prophylaxis: heparin Code: Full  Dispo: Discharge anticipated in 1-2 days pending medical workup.  This is a Psychologist, occupational Note.  The care of the patient was discussed with Dr. Ladona Ridgel and the assessment and plan was formulated with their assistance.  Please see their note for official documentation of the patient encounter.   Signed: Mikal Plane, Medical Student 08/10/2016, 4:49 PM

## 2016-08-11 DIAGNOSIS — I2510 Atherosclerotic heart disease of native coronary artery without angina pectoris: Secondary | ICD-10-CM | POA: Diagnosis not present

## 2016-08-11 DIAGNOSIS — N184 Chronic kidney disease, stage 4 (severe): Secondary | ICD-10-CM | POA: Diagnosis not present

## 2016-08-11 DIAGNOSIS — R0789 Other chest pain: Secondary | ICD-10-CM | POA: Diagnosis not present

## 2016-08-11 DIAGNOSIS — E119 Type 2 diabetes mellitus without complications: Secondary | ICD-10-CM | POA: Insufficient documentation

## 2016-08-11 DIAGNOSIS — I10 Essential (primary) hypertension: Secondary | ICD-10-CM | POA: Diagnosis not present

## 2016-08-11 LAB — BASIC METABOLIC PANEL
Anion gap: 8 (ref 5–15)
BUN: 25 mg/dL — AB (ref 6–20)
CO2: 25 mmol/L (ref 22–32)
Calcium: 8.5 mg/dL — ABNORMAL LOW (ref 8.9–10.3)
Chloride: 106 mmol/L (ref 101–111)
Creatinine, Ser: 3.02 mg/dL — ABNORMAL HIGH (ref 0.61–1.24)
GFR, EST AFRICAN AMERICAN: 24 mL/min — AB (ref >60)
GFR, EST NON AFRICAN AMERICAN: 21 mL/min — AB (ref >60)
Glucose, Bld: 119 mg/dL — ABNORMAL HIGH (ref 65–99)
POTASSIUM: 3.8 mmol/L (ref 3.5–5.1)
SODIUM: 139 mmol/L (ref 135–145)

## 2016-08-11 LAB — LIPID PANEL
CHOL/HDL RATIO: 4.5 ratio
Cholesterol: 143 mg/dL (ref 0–200)
HDL: 32 mg/dL — AB (ref >40)
LDL CALC: 77 mg/dL (ref 0–99)
TRIGLYCERIDES: 171 mg/dL — AB (ref <150)
VLDL: 34 mg/dL (ref 0–40)

## 2016-08-11 LAB — CBC
HEMATOCRIT: 34.4 % — AB (ref 39.0–52.0)
Hemoglobin: 11 g/dL — ABNORMAL LOW (ref 13.0–17.0)
MCH: 28 pg (ref 26.0–34.0)
MCHC: 32 g/dL (ref 30.0–36.0)
MCV: 87.5 fL (ref 78.0–100.0)
Platelets: 251 10*3/uL (ref 150–400)
RBC: 3.93 MIL/uL — AB (ref 4.22–5.81)
RDW: 14.4 % (ref 11.5–15.5)
WBC: 5.3 10*3/uL (ref 4.0–10.5)

## 2016-08-11 LAB — TROPONIN I

## 2016-08-11 MED ORDER — OMEPRAZOLE MAGNESIUM 20 MG PO TBEC: 20.0000 mg | DELAYED_RELEASE_TABLET | Freq: Every day | ORAL | 1 refills | Status: DC

## 2016-08-11 NOTE — Discharge Instructions (Signed)
Chest Wall Pain °Chest wall pain is pain in or around the bones and muscles of your chest. Sometimes, an injury causes this pain. Sometimes, the cause may not be known. This pain may take several weeks or longer to get better. °Follow these instructions at home: °Pay attention to any changes in your symptoms. Take these actions to help with your pain: °· Rest as told by your doctor. °· Avoid activities that cause pain. Try not to use your chest, belly (abdominal), or side muscles to lift heavy things. °· If directed, apply ice to the painful area: °? Put ice in a plastic bag. °? Place a towel between your skin and the bag. °? Leave the ice on for 20 minutes, 2-3 times per day. °· Take over-the-counter and prescription medicines only as told by your doctor. °· Do not use tobacco products, including cigarettes, chewing tobacco, and e-cigarettes. If you need help quitting, ask your doctor. °· Keep all follow-up visits as told by your doctor. This is important. ° °Contact a doctor if: °· You have a fever. °· Your chest pain gets worse. °· You have new symptoms. °Get help right away if: °· You feel sick to your stomach (nauseous) or you throw up (vomit). °· You feel sweaty or light-headed. °· You have a cough with phlegm (sputum) or you cough up blood. °· You are short of breath. °This information is not intended to replace advice given to you by your health care provider. Make sure you discuss any questions you have with your health care provider. °Document Released: 08/11/2007 Document Revised: 07/31/2015 Document Reviewed: 05/20/2014 °Elsevier Interactive Patient Education © 2018 [employer] Inc. ° °

## 2016-08-11 NOTE — Progress Notes (Signed)
Responded to [facility] Consult to pray with patient. Pt made know his desire and prayer was made accordingly.  Pt. Alert and talking on phone.  Patient indicated he appreciated visit and prayer.  Will follow as needed.  Venida Jarvis, [place], [facility], Pager [phone]

## 2016-08-11 NOTE — Progress Notes (Signed)
The patient has been seen in conjunction with Laverda Page, NP. All aspects of care have been considered and discussed. The patient has been personally interviewed, examined, and all clinical data has been reviewed.   No evidence for infarction or ACS.  Encourage medication compliance,  Okay to discharge. We are signing off.   Progress Note  Patient Name: Richard Barnett Date of Encounter: 08/11/2016  Primary Cardiologist: [facility]  Subjective   Stated he feel much better this morning. No further chest pain. Sitting up in the side of the bed eating.   Inpatient Medications    Scheduled Meds: . amLODipine  10 mg Oral Daily  . atorvastatin  40 mg Oral q1800  . citalopram  40 mg Oral Daily  . cloNIDine  0.2 mg Oral BID  . heparin  5,000 Units Subcutaneous Q8H  . isosorbide mononitrate  30 mg Oral Daily  . metoprolol tartrate  50 mg Oral BID  . pneumococcal 23 valent vaccine  0.5 mL Intramuscular Tomorrow-1000  . senna-docusate  2 tablet Oral BID   Continuous Infusions:  PRN Meds: nitroGLYCERIN   Vital Signs    Vitals:   08/10/16 1322 08/10/16 2001 08/11/16 0000 08/11/16 0458  BP: 136/85 (!) 173/100 (!) 154/93 138/77  Pulse: (!) 57 63  (!) 48  Resp: 12 15  (!) 23  Temp: 97.6 F (36.4 C) 98 F (36.7 C)  98.4 F (36.9 C)  TempSrc: Oral Oral  Oral  SpO2: 100% 97%  97%  Weight: 221 lb 9 oz (100.5 kg)     Height: 5\' 7"  (1.702 m)       Intake/Output Summary (Last 24 hours) at 08/11/16 0803 Last data filed at 08/11/16 0600  Gross per 24 hour  Intake              350 ml  Output              675 ml  Net             -325 ml   Filed Weights   08/10/16 1322  Weight: 221 lb 9 oz (100.5 kg)    Telemetry    SB - Personally Reviewed  ECG    N/A - Personally Reviewed  Physical Exam   General: Pleasant obese AA [place] appearing in no acute distress. Head: Normocephalic, atraumatic.  Neck: Supple without bruits, JVD. Lungs:  Resp regular and unlabored,  CTA. Heart: RRR, S1, S2, no S3, S4, or murmur; no rub. Abdomen: Soft, non-tender, non-distended with normoactive bowel sounds. No hepatomegaly. No rebound/guarding. No obvious abdominal masses. Extremities: No clubbing, cyanosis, edema. Distal pedal pulses are 2+ bilaterally. Neuro: Alert and oriented X 3. Moves all extremities spontaneously. Psych: Normal affect.  Labs    Chemistry Recent Labs Lab 08/10/16 0846 08/11/16 0332  NA 141 139  K 3.9 3.8  CL 110 106  CO2 21* 25  GLUCOSE 124* 119*  BUN 27* 25*  CREATININE 3.23* 3.02*  CALCIUM 8.6* 8.5*  GFRNONAA 19* 21*  GFRAA 22* 24*  ANIONGAP 10 8     Hematology Recent Labs Lab 08/10/16 0846 08/11/16 0332  WBC 6.0 5.3  RBC 4.20* 3.93*  HGB 11.9* 11.0*  HCT 36.8* 34.4*  MCV 87.6 87.5  MCH 28.3 28.0  MCHC 32.3 32.0  RDW 14.3 14.4  PLT 299 251    Cardiac Enzymes Recent Labs Lab 08/10/16 0846 08/10/16 1129 08/10/16 1907 08/10/16 2310  TROPONINI <0.03 <0.03 <0.03 <0.03    Recent Labs  Lab 08/10/16 0906  TROPIPOC 0.02     BNP Recent Labs Lab 08/10/16 0846  BNP 25.2     DDimer No results for input(s): DDIMER in the last 168 hours.    Radiology    Dg Chest 2 View  Result Date: 08/10/2016 CLINICAL DATA:  RIGHT-sided chest pain this morning, hypertension, diabetes mellitus EXAM: CHEST  2 VIEW COMPARISON:  01/10/2016 FINDINGS: Upper normal heart size. Normal mediastinal contours and pulmonary vascularity. Lungs clear. No acute infiltrate, pleural effusion or pneumothorax. Bones unremarkable. IMPRESSION: No acute abnormalities. Electronically Signed   By: Ulyses Southward M.D.   On: 08/10/2016 09:10    Cardiac Studies   N/A  Patient Profile     61 y.o. [place] with PMH of CAD s/p 2 stents (placed previous while in [place], 2013), CKD stage IV, DVT, HTN, and HL who presented with chest pain.   Assessment & Plan    1. Chest pain: Symptoms are atypical in nature and not similar to those he experienced with  previous stents placed in 2013. He has ruled out, and EKG with no acute changes. No recurrent right sided pain.   -- Given advanced CKD, negative enzymes and no EKG changes. Would continue to medical therapy at this time.  -- continue BB, statin, amlodipine, and Imdur  2. CKD IV: Following intermittently with nephrology through [facility].   3. HTN: improved, would continue with current therapy.   -- no ACEi/ARB with CKD  4. HL: On statin   Signed, Laverda Page, NP  08/11/2016, 8:03 AM

## 2016-08-11 NOTE — Discharge Summary (Signed)
Name: Richard Barnett MRN: [id number] DOB: 1955-06-13 61 y.o. PCP: Massie Maroon, FNP  Date of Admission: 08/10/2016  8:30 AM Date of Discharge: 08/11/2016 Attending Physician: No att. providers found  Discharge Diagnosis: 1. Atypical Chest Pain 2. Hypertensive Emergency  3. Chronic Medical Conditions Principal Problem:   Chest pain Active Problems:   CAD (coronary artery disease)   Hypertension   Hyperlipidemia   Chronic kidney disease, stage IV (severe) (HCC)   Depression   Discharge Medications: Allergies as of 08/11/2016      Reactions   Penicillins Swelling, Rash   Swelling of whole body  Has patient had a PCN reaction causing immediate rash, facial/tongue/throat swelling, SOB or lightheadedness with hypotension: No Has patient had a PCN reaction causing severe rash involving mucus membranes or skin necrosis: No Has patient had a PCN reaction that required hospitalization No Has patient had a PCN reaction occurring within the last 10 years: No If all of the above answers are "NO", then may proceed with Cephalosporin use.      Medication List    TAKE these medications   amLODipine 10 MG tablet Commonly known as:  NORVASC TAKE ONE (1) TABLET BY MOUTH EVERY DAY What changed:  how much to take  how to take this  when to take this  additional instructions   aspirin 81 MG EC tablet Take 1 tablet (81 mg total) by mouth daily. Swallow whole.   atorvastatin 40 MG tablet Commonly known as:  LIPITOR TAKE ONE TABLET BY MOUTH EVERY EVENING AT SIX P.M. What changed:  how much to take  how to take this  when to take this  additional instructions   citalopram 40 MG tablet Commonly known as:  CELEXA Take 40 mg by mouth daily.   cloNIDine 0.2 MG tablet Commonly known as:  CATAPRES TAKE ONE (1) TABLET BY MOUTH TWO (2) TIMES DAILY What changed:  how much to take  how to take this  when to take this  additional instructions   glycerin adult 2 g  suppository Place 1 suppository rectally as needed for constipation.   isosorbide mononitrate 30 MG 24 hr tablet Commonly known as:  IMDUR Take 1 tablet (30 mg total) by mouth daily.   metoprolol tartrate 50 MG tablet Commonly known as:  LOPRESSOR TAKE ONE (1) TABLET BY MOUTH TWO (2) TIMES DAILY What changed:  how much to take  how to take this  when to take this  additional instructions   nitroGLYCERIN 0.4 MG SL tablet Commonly known as:  NITROSTAT DISSOLVE 1 TABLET UNDER THE TONGUE AS NEEDED FOR CHEST PAIN. REPEAT AS NEEDED EVERY 5 MINUTES UP TO A TOTAL OF 3 DOSES   omeprazole 20 MG tablet Commonly known as:  PRILOSEC OTC Take 1 tablet (20 mg total) by mouth daily.   PRESCRIPTION MEDICATION Place into both eyes See admin instructions. 2 different eye drops from [facility] - instill 1 drop of each into both eyes every morning       Disposition and follow-up:   Richard Barnett was discharged from [facility] in Good condition.  At the hospital follow up visit please address:  1.  Please ensure he is taking his medications.  2.  Labs / imaging needed at time of follow-up: None  3.  Pending labs/ test needing follow-up: None  Follow-up Appointments: Follow-up Information    [facility] Follow up.   Specialty:  Home Health Services Why:  Registered Nurse for medication management.  Contact information: [address] [place] [address] [phone]           Hospital Course by problem list: Principal Problem:   Chest pain Active Problems:   CAD (coronary artery disease)   Hypertension   Hyperlipidemia   Chronic kidney disease, stage IV (severe) (HCC)   Depression   1. Atypical chest pain The patient was admitted with atypical chest pain on 08/10/2016.The patient has a heart score of 5 and is intermediate risk for coronary ischemia. He presented with atypical chest pain with unchanged EKG and  negative troponins.He was evaluated by cardiology who did not recommend catheterization or stress testing. He is already on good medical therapy. We will continue with current medical therapy. The exact etiology of the patient's chest pain is unclear although I favor Hypertensive urgency secondary to uncontrolled hypertension and medication noncompliance. Another explanation could be secondary to GI source secondary to the patient's self-reported history of esophageal reflux disease and prior ulcerative disease. He'll be discharged on his home medications for the treatment of his hypertension, hyperlipidemia and history of coronary artery disease. We'll add a proton pump inhibitor. He will follow-up with his PCP.  2. Hypertensive urgency At the time of presentation to the emergency department the patient was hypertensive to 183/97. This was in the setting of his atypical chest pain. His hypertensive urgency may have been the underlying etiology of his symptoms. His hypertension is secondary to medication nonadherence. We recommended restarting his anti-hypertensive medications. I spoke with pharmacy who ensured that he would have his medications this afternoon or by tomorrow at the latest. He will follow-up with his PCP.   3. Chronic medical conditions He will be discharged on all of his medications that he was on prior to being admitted to the hospital unless otherwise specified above under points #1 or 2.    Discharge Vitals:   BP 124/81   Pulse 62   Temp 97.8 F (36.6 C) (Oral)   Resp 18   Ht 5\' 7"  (1.702 m)   Wt 221 lb 9 oz (100.5 kg)   SpO2 96%   BMI 34.70 kg/m   Pertinent Labs, Studies, and Procedures:  1. Diagnostic chest radiograph-no acute cardiopulmonary abnormality noted 2. Troponins-negative 3  Discharge Instructions: Discharge Instructions    Diet - low sodium heart healthy    Complete by:  As directed    Discharge instructions    Complete by:  As directed    It sounds  like you have a great primary care physician and pharmacy. Please call your physician today and schedule an appointment. Please tell them you need a hospital follow-up appointment.  Please continue to take your medications.  I started a new medication called omeprazole. This will treat your ulcers in your stomach.Please take 1 pill once daily.  It is extremely important that you take all of your medications as prescribed as these will help you feel better! Please make an appointment to see your PCP!   Increase activity slowly    Complete by:  As directed      Signed: Thomasene Lot, MD 08/11/2016, 1:24 PM   Pager: [phone]

## 2016-08-11 NOTE — Progress Notes (Signed)
   Subjective: Patient without chest pain this morning. No shortness of breath. Says he is doing okay.  Objective:  Vital signs in last 24 hours: Vitals:   08/11/16 0000 08/11/16 0458 08/11/16 0714 08/11/16 0851  BP: (!) 154/93 138/77 124/81   Pulse:  (!) 48  62  Resp:  (!) 23  18  Temp:  98.4 F (36.9 C)  97.8 F (36.6 C)  TempSrc:  Oral  Oral  SpO2:  97%  96%  Weight:      Height:       Physical Exam  Constitutional: He is oriented to person, place, and time. He appears well-developed and well-nourished.  HENT:  Head: Normocephalic and atraumatic.  Cardiovascular: Normal rate and regular rhythm.   No murmur heard. Respiratory: Effort normal and breath sounds normal.  Neurological: He is alert and oriented to person, place, and time.     Assessment/Plan:  Principal Problem:   Chest pain Active Problems:   CAD (coronary artery disease)   Hypertension   Hyperlipidemia   Chronic kidney disease, stage IV (severe) (HCC)   Depression  Richard Barnett is a 61 yo man with a history of CAD s/p PCI in 2016, stage IV CKD, HTN, HLD, and depression who presents with chest pain.   # Atypical Chest Pain ## HTN ## Hx of CAD The patient has a heart score of 5 and is intermediate risk for coronary ischemia. He presented with atypical chest pain with unchanged EKG and negative troponins. Evaluated by cardiology who does not recommend catheterization or stress testing. He is already on good medical therapy. We will continue with current medical therapy. The exact etiology of the patient's chest pain is unclear although I favor Hypertensive urgency secondary to uncontrolled hypertension and medication noncompliance. Another explanation could be secondary to GI source secondary to the patient's self-reported history of esophageal reflux disease and prior ulcerative disease. He'll be discharged on his home medications for the treatment of his hypertension, hyperlipidemia and history of coronary  artery disease. We'll add a proton pump inhibitor. -- Resume home medications at discharge -- Add proton pump inhibitor  # Hypertension Urgency Patient hypertensive to 183/97 at admission. This was in the setting of atypical chest pain. His hypertensive urgency may be the cause of his presenting symptoms. This is secondary to medication nonadherence. Recommend restarting antihypertensive medications.  # CKD stage IV -- Continue follow-up with outpatient nephrology  Dispo: Anticipated discharge today.   Thomasene Lot, MD 08/11/2016, 10:56 AM Pager: [phone]

## 2016-08-11 NOTE — Progress Notes (Signed)
Subjective: The patient reports feeling mild pain in his right side, but reports no chest pain.  He has no SOB or trouble breathing.  Objective: Vital signs in last 24 hours: Vitals:   08/11/16 0000 08/11/16 0458 08/11/16 0714 08/11/16 0851  BP: (!) 154/93 138/77 124/81   Pulse:  (!) 48  62  Resp:  (!) 23  18  Temp:  98.4 F (36.9 C)  97.8 F (36.6 C)  TempSrc:  Oral  Oral  SpO2:  97%  96%  Weight:      Height:       Weight change:   Intake/Output Summary (Last 24 hours) at 08/11/16 1115 Last data filed at 08/11/16 0600  Gross per 24 hour  Intake              350 ml  Output              675 ml  Net             -325 ml    Physical Exam: GEN: well appearing, in no acute distress CV: Normal rate and rhythm RESP: Lungs CTAB, normal WOB ABD: Normal bowel sounds, no tenderness to palpation   Lab Results: CBC Latest Ref Rng & Units 08/11/2016 08/10/2016 07/17/2016  WBC 4.0 - 10.5 K/uL 5.3 6.0 5.6  Hemoglobin 13.0 - 17.0 g/dL 11.0(L) 11.9(L) 12.1(L)  Hematocrit 39.0 - 52.0 % 34.4(L) 36.8(L) 37.4(L)  Platelets 150 - 400 K/uL 251 299 269    BMP Latest Ref Rng & Units 08/11/2016 08/10/2016 07/17/2016  Glucose 65 - 99 mg/dL 161(W) 960(A) 540(J)  BUN 6 - 20 mg/dL 81(X) 91(Y) 78(G)  Creatinine 0.61 - 1.24 mg/dL 9.56(O) 1.30(Q) 6.57(Q)  Sodium 135 - 145 mmol/L 139 141 140  Potassium 3.5 - 5.1 mmol/L 3.8 3.9 4.4  Chloride 101 - 111 mmol/L 106 110 109  CO2 22 - 32 mmol/L 25 21(L) 22  Calcium 8.9 - 10.3 mg/dL 4.6(N) 6.2(X) 8.9     Micro Results: Recent Results (from the past 240 hour(s))  MRSA PCR Screening     Status: None   Collection Time: 08/10/16  1:29 PM  Result Value Ref Range Status   MRSA by PCR NEGATIVE NEGATIVE Final    Comment:        The GeneXpert MRSA Assay (FDA approved for NASAL specimens only), is one component of a comprehensive MRSA colonization surveillance program. It is not intended to diagnose MRSA infection nor to guide or monitor treatment for MRSA  infections.     Studies/Results: Dg Chest 2 View  Result Date: 08/10/2016 CLINICAL DATA:  RIGHT-sided chest pain this morning, hypertension, diabetes mellitus EXAM: CHEST  2 VIEW COMPARISON:  01/10/2016 FINDINGS: Upper normal heart size. Normal mediastinal contours and pulmonary vascularity. Lungs clear. No acute infiltrate, pleural effusion or pneumothorax. Bones unremarkable. IMPRESSION: No acute abnormalities. Electronically Signed   By: Ulyses Southward M.D.   On: 08/10/2016 09:10    Medications:  Scheduled Meds: . amLODipine  10 mg Oral Daily  . atorvastatin  40 mg Oral q1800  . citalopram  40 mg Oral Daily  . cloNIDine  0.2 mg Oral BID  . heparin  5,000 Units Subcutaneous Q8H  . isosorbide mononitrate  30 mg Oral Daily  . metoprolol tartrate  50 mg Oral BID  . senna-docusate  2 tablet Oral BID   Continuous Infusions: PRN Meds:.nitroGLYCERIN   Assessment/Plan: Principal Problem:   Chest pain Active Problems:   CAD (coronary artery disease)  Hypertension   Hyperlipidemia   Chronic kidney disease, stage IV (severe) (HCC)   Depression  Chest pain/CAD: The patient presented with atypical chest pain, unchanged EKG and negative troponin.  Patient is at high risk for CAD based on history of CAD requiring intervention in addition to HTN and HLD.  HEART score is 5, indicative of 12-16.6% chance of major cardiac event.  Cardiology recommends medical management of pain with no further workup. - Restart home medications upon discharge - Add PPI  CKD: Patient has a history of CKD, for which he reports starting dialysis soon. - f/u with his outpatient nephrologist  HTN: Patient has a history of HTN, which is managed with multiple medications - restart home medications upon discharge  HLD: Patient has a history of HLD, managed with atorvastatin - continue home atorvastatin  Depression: Patient has a history of depression managed with citalopram - continue home citalopram   This  is a Psychologist, occupational Note.  The care of the patient was discussed with Dr. Ladona Ridgel and the assessment and plan formulated with their assistance.  Please see their attached note for official documentation of the daily encounter.   LOS: 0 days   Mikal Plane, Medical Student 08/11/2016, 11:15 AM

## 2016-08-11 NOTE — Plan of Care (Signed)
Problem: Nutrition: Goal: Adequate nutrition will be maintained Outcome: Progressing Patient consumed 100% of meals. Tolerates po intake.  Pt progressing towards goal.

## 2016-08-11 NOTE — Care Management Note (Signed)
Case Management Note  Patient Details  Name: Richard Barnett MRN: [id number] Date of Birth: November 24, 1955  Subjective/Objective:  Pt presented for Chest Pian. Pt is from home alone. Pt states he has a daughter, however she works. Per pt he had been assigned an Aide via Medicaid- however she does not come out often. Unsure who pt has as PCP- [facility] listed in EP[facility] and Medications are being ordered via Dr.Osei Bonsu @ the [facility]. Pt has medications delivered from [facility].                 Action/Plan: CM did reach out to Liaison for [facility] and she verified that pt has PCP at the [facility]. Liaison to make a referral to [facility] to see if pt can be seen in the the Community. [facility]-last medications fill on May 4th and called pt on June 4th for delivery but patient was in the hospital. Medications were delivered to unit before pt was d/c in order for patient to have at d/c. Per [facility] pt has not been taking his night time dose of Clonidine and Metoprolol Tartrate and wanted to see if [facility] could contact PCP Osei Bonsu to see if can change to long acting. Pharmacy has tried to contact office, however no response. CM did speak with pt in regards to [facility] RN visiting at home. Pt is agreeable to services. Agency list provided and chose [facility]. Referral given to [facility] for [facility] and address verified and correct.  HH Services to begin within 24-48 hours post d/c. RN to speak with CSW at [facility] as well in regards to transportation. Pt not clear who he needs to call for transportation to MD appointments. He feels like it may be [employer], but could not remember. No further needs from CM at this time.   Expected Discharge Date:  08/11/16               Expected Discharge Plan:  Home w Home Health Services  In-House Referral:  NA  Discharge planning Services  CM Consult  Post Acute Care Choice:  Home Health Choice offered to:   Patient  DME Arranged:  N/A DME Agency:  NA  HH Arranged:  RN HH Agency:  [facility]  Status of Service:  Completed, signed off  If discussed at Long Length of Stay Meetings, dates discussed:    Additional Comments:  Gala Lewandowsky, RN 08/11/2016, 12:31 PM

## 2016-09-06 ENCOUNTER — Encounter: Payer: Self-pay | Admitting: Vascular Surgery

## 2016-09-13 ENCOUNTER — Other Ambulatory Visit: Payer: Self-pay

## 2016-09-13 DIAGNOSIS — Z01810 Encounter for preprocedural cardiovascular examination: Secondary | ICD-10-CM

## 2016-09-13 DIAGNOSIS — N185 Chronic kidney disease, stage 5: Secondary | ICD-10-CM

## 2016-09-16 ENCOUNTER — Ambulatory Visit (INDEPENDENT_AMBULATORY_CARE_PROVIDER_SITE_OTHER)
Admission: RE | Admit: 2016-09-16 | Discharge: 2016-09-16 | Disposition: A | Discharge status: Home / Self Care | Payer: Medicaid Other | Source: Ambulatory Visit | Attending: Vascular Surgery | Admitting: Vascular Surgery

## 2016-09-16 ENCOUNTER — Encounter: Payer: Self-pay | Admitting: Vascular Surgery

## 2016-09-16 ENCOUNTER — Ambulatory Visit (INDEPENDENT_AMBULATORY_CARE_PROVIDER_SITE_OTHER): Payer: Medicaid Other | Admitting: Vascular Surgery

## 2016-09-16 ENCOUNTER — Ambulatory Visit (HOSPITAL_COMMUNITY)
Admission: RE | Admit: 2016-09-16 | Discharge: 2016-09-16 | Disposition: A | Discharge status: Home / Self Care | Payer: Medicaid Other | Source: Ambulatory Visit | Attending: Vascular Surgery | Admitting: Vascular Surgery

## 2016-09-16 VITALS — BP 137/78 | HR 51 | Temp 97.0°F | Resp 18 | Ht 67.0 in | Wt 228.0 lb

## 2016-09-16 DIAGNOSIS — Z01810 Encounter for preprocedural cardiovascular examination: Secondary | ICD-10-CM | POA: Diagnosis present

## 2016-09-16 DIAGNOSIS — N184 Chronic kidney disease, stage 4 (severe): Secondary | ICD-10-CM | POA: Diagnosis not present

## 2016-09-16 DIAGNOSIS — N185 Chronic kidney disease, stage 5: Secondary | ICD-10-CM | POA: Diagnosis not present

## 2016-09-16 NOTE — Progress Notes (Signed)
Patient name: Richard Barnett MRN: [id number] DOB: [birth year] Sex: Richard Barnett   REASON FOR CONSULT:    Evaluate for hemodialysis access. The physician requesting the consult was Dr. Elvis Coil.  HPI:   Richard Barnett is a pleasant 61 y.o. Richard Barnett,  who was referred with stage IV chronic kidney disease. 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 Of note he is a poor historian. He is right-handed. He denies any recent uremic symptoms including nausea, vomiting, fatigue, anorexia, or palpitations.  He does not have a pacemaker. He has noted some intermittent right upper extremity swelling.  He does have a history of coronary artery disease and underwent PTCA in [place] in 2016 2017. He denies any recent chest pain or chest pressure.   Past Medical History:  Diagnosis Date  . Arthritis    "right leg" (08/22/2014)  . CAD (coronary artery disease)    a. CAD s/p 2 stent placement in [place] 2016. b. Neg nuc 05/2014 & 01/2016.  Marland Kitchen CKD (chronic kidney disease), stage IV (HCC)    Richard Barnett 08/22/2014  . Daily headache   . Depression    "I always get that" (08/22/2014)  . Diabetes mellitus (HCC)   . DVT (deep venous thrombosis) (HCC) ?   RLE  . Heart murmur   . Hypercholesterolemia   . Hypertension     Family History  Problem Relation Age of Onset  . Hypertension Mother   . Kidney disease Mother   . Heart disease Mother   . Heart disease Father   . Hypertension Father     SOCIAL HISTORY: Social History   Social History  . Marital status: Divorced    Spouse name: N/A  . Number of children: N/A  . Years of education: N/A   Occupational History  . Not on file.   Social History Main Topics  . Smoking status: Former Smoker    Packs/day: 0.00    Years: 40.00    Types: Cigarettes    Quit date: 08/29/2010  . Smokeless tobacco: Never Used     Comment: "quit smoking cigarettes in ~ 2011"  . Alcohol use No    Comment: 08/22/2014 "stopped in ~ 2011"  . Drug use: No     Comment: 08/22/2014 "stopped in ~ 2011; all types of drugs""  . Sexual activity: No   Other Topics Concern  . Not on file   Social History Narrative  . No narrative on file    Allergies  Allergen Reactions  . Penicillins Swelling and Rash    Swelling of whole body  Has patient had a PCN reaction causing immediate rash, facial/tongue/throat swelling, SOB or lightheadedness with hypotension: No Has patient had a PCN reaction causing severe rash involving mucus membranes or skin necrosis: No Has patient had a PCN reaction that required hospitalization No Has patient had a PCN reaction occurring within the last 10 years: No If all of the above answers are "NO", then may proceed with Cephalosporin use.     Current Outpatient Prescriptions  Medication Sig Dispense Refill  . amLODipine (NORVASC) 10 MG tablet TAKE ONE (1) TABLET BY MOUTH EVERY DAY (Patient taking differently: Take 20 mg by mouth daily. ) 30 tablet 0  . aspirin 81 MG EC tablet Take 1 tablet (81 mg total) by mouth daily. Swallow whole. 30 tablet 0  . atorvastatin (LIPITOR) 40 MG tablet TAKE ONE TABLET BY MOUTH EVERY EVENING  AT SIX P.M. (Patient taking differently: Take 40 mg by mouth daily. ) 30 tablet 0  . citalopram (CELEXA) 40 MG tablet Take 40 mg by mouth daily.    . cloNIDine (CATAPRES) 0.2 MG tablet TAKE ONE (1) TABLET BY MOUTH TWO (2) TIMES DAILY (Patient taking differently: Take 0.2 mg by mouth daily. ) 60 tablet 0  . glycerin adult 2 g suppository Place 1 suppository rectally as needed for constipation. 12 suppository 0  . isosorbide mononitrate (IMDUR) 30 MG 24 hr tablet Take 1 tablet (30 mg total) by mouth daily. 30 tablet 0  . metoprolol (LOPRESSOR) 50 MG tablet TAKE ONE (1) TABLET BY MOUTH TWO (2) TIMES DAILY (Patient taking differently: Take 50 mg by mouth daily. ) 60 tablet 0  . nitroGLYCERIN (NITROSTAT) 0.4 MG SL tablet DISSOLVE 1 TABLET UNDER THE  TONGUE AS NEEDED FOR CHEST PAIN. REPEAT AS NEEDED EVERY 5 MINUTES UP TO A TOTAL OF 3 DOSES 25 tablet 2  . omeprazole (PRILOSEC OTC) 20 MG tablet Take 1 tablet (20 mg total) by mouth daily. 30 tablet 1  . PRESCRIPTION MEDICATION Place into both eyes See admin instructions. 2 different eye drops from [facility] - instill 1 drop of each into both eyes every morning     No current facility-administered medications for this visit.     REVIEW OF SYSTEMS:  [X]  denotes positive finding, [ ]  denotes negative finding Cardiac  Comments:  Chest pain or chest pressure:    Shortness of breath upon exertion:    Short of breath when lying flat:    Irregular heart rhythm:        Vascular    Pain in calf, thigh, or hip brought on by ambulation:    Pain in feet at night that wakes you up from your sleep:     Blood clot in your veins:    Leg swelling:         Pulmonary    Oxygen at home:    Productive cough:     Wheezing:         Neurologic    Sudden weakness in arms or legs:     Sudden numbness in arms or legs:     Sudden onset of difficulty speaking or slurred speech:    Temporary loss of vision in one eye:     Problems with dizziness:         Gastrointestinal    Blood in stool:     Vomited blood:         Genitourinary    Burning when urinating:     Blood in urine:        Psychiatric    Major depression:         Hematologic    Bleeding problems:    Problems with blood clotting too easily:        Skin    Rashes or ulcers:        Constitutional    Fever or chills:     PHYSICAL EXAM:   Vitals:   09/16/16 1245  BP: 137/78  Pulse: (!) 51  Resp: 18  Temp: (!) 97 F (36.1 C)  SpO2: 98%  Weight: 228 lb (103.4 kg)  Height: 5\' 7"  (1.702 m)    GENERAL: The patient is a well-nourished Richard Barnett, in no acute distress. The vital signs are documented above. CARDIAC: There is a regular rate and rhythm.  VASCULAR: I do not detect carotid bruits. He has palpable brachial  and  radial pulses bilaterally. PULMONARY: There is good air exchange bilaterally without wheezing or rales. ABDOMEN: Soft and non-tender with normal pitched bowel sounds.  MUSCULOSKELETAL: There are no major deformities or cyanosis. NEUROLOGIC: No focal weakness or paresthesias are detected. SKIN: There are no ulcers or rashes noted. PSYCHIATRIC: The patient has a normal affect.  DATA:    UPPER EXTREMITY VEIN MAP: I have independently interpreted his upper extremity vein map today.  On the right side the forearm and upper arm cephalic vein are somewhat marginal in size. The basilic vein on the right looks reasonable in size.  On the left side the forearm cephalic vein and upper arm cephalic vein looked marginal in size. The basilic vein on the left is also marginal.  UPPER EXTREMITY ARTERIAL DUPLEX: I have independently interpreted his upper extremity arterial duplex  today.  On the right side there is a triphasic radial and ulnar waveform. Brachial artery on the right is 0.64 cm.  On the left side there is a triphasic radial and ulnar waveform. Brachial artery and the left is 0.57 cm.  LABS: On 08/11/2016, his creatinine clearance was 24.   MEDICAL ISSUES:   STAGE IV CHRONIC KIDNEY DISEASE: The patient has marginal veins bilaterally. Given that he has had some intermittent right upper external swelling I would favor attempting access in the left arm. He is right-handed. I will evaluate the forearm and upper arm cephalic vein on the left and that this is adequate to a radial cephalic or brachial cephalic fistula. If this is not adequate that he could potentially have a first stage basilic vein transposition. I have discussed the need to do this in 2 stages with him if this is ultimately what he requires. If none of his veins are adequate then I would place an AV graft.   I have explained the indications for placement of an AV fistula or AV graft. I've explained that if at all possible we  will place an AV fistula.  I have reviewed the risks of placement of an AV fistula including but not limited to: failure of the fistula to mature, need for subsequent interventions, and thrombosis. In addition I have reviewed the potential complications of placement of an AV graft. These risks include, but are not limited to, graft thrombosis, graft infection, wound healing problems, bleeding, arm swelling, and steal syndrome. All the patient's questions were answered and they are agreeable to proceed with surgery.  He would like to have his surgery done on a Friday and this has been scheduled for 10/01/2016. All of his questions were answered and he is agreeable to proceed.   [name] [facility] [phone]

## 2016-09-21 ENCOUNTER — Other Ambulatory Visit: Payer: Self-pay

## 2016-09-29 ENCOUNTER — Encounter (HOSPITAL_COMMUNITY): Payer: Self-pay | Admitting: *Deleted

## 2016-09-29 NOTE — Progress Notes (Signed)
Spoke with pt for pre-op call. Note in EPIC that pt is "low literacy". He states he can't read. Pt has hx of CAD with 2 stents, sees cardiologist at [facility]. Last office visit note with stress test is in chart. Pt hasn't had any recent chest pain. Medical record has that he has diabetes, but he states he was told he did not have it. He states he does not check his blood sugar at home. Last A1C was 6.4 on 01/11/16. I have requested any more recent results from Dr. Rubye Oaks office. Pt was admitted here in June for chest pain

## 2016-09-30 MED ORDER — VANCOMYCIN HCL 10 G IV SOLR
1500.0000 mg | INTRAVENOUS | Status: AC
  Administered 2016-10-01: 1500 mg via INTRAVENOUS
  Filled 2016-09-30: qty 1500

## 2016-10-01 ENCOUNTER — Ambulatory Visit (HOSPITAL_COMMUNITY): Payer: Medicaid Other | Admitting: Certified Registered Nurse Anesthetist

## 2016-10-01 ENCOUNTER — Encounter (HOSPITAL_COMMUNITY): Payer: Self-pay | Admitting: Certified Registered Nurse Anesthetist

## 2016-10-01 ENCOUNTER — Telehealth: Payer: Self-pay | Admitting: Vascular Surgery

## 2016-10-01 ENCOUNTER — Ambulatory Visit (HOSPITAL_COMMUNITY)
Admission: RE | Admit: 2016-10-01 | Discharge: 2016-10-01 | Disposition: A | Discharge status: Home / Self Care | Payer: Medicaid Other | Source: Ambulatory Visit | Attending: Vascular Surgery | Admitting: Vascular Surgery

## 2016-10-01 ENCOUNTER — Encounter (HOSPITAL_COMMUNITY)
Admission: RE | Disposition: A | Discharge status: Home / Self Care | Payer: Self-pay | Source: Ambulatory Visit | Attending: Vascular Surgery

## 2016-10-01 DIAGNOSIS — Z86718 Personal history of other venous thrombosis and embolism: Secondary | ICD-10-CM | POA: Diagnosis not present

## 2016-10-01 DIAGNOSIS — F329 Major depressive disorder, single episode, unspecified: Secondary | ICD-10-CM | POA: Insufficient documentation

## 2016-10-01 DIAGNOSIS — M1990 Unspecified osteoarthritis, unspecified site: Secondary | ICD-10-CM | POA: Diagnosis not present

## 2016-10-01 DIAGNOSIS — E1122 Type 2 diabetes mellitus with diabetic chronic kidney disease: Secondary | ICD-10-CM | POA: Insufficient documentation

## 2016-10-01 DIAGNOSIS — N184 Chronic kidney disease, stage 4 (severe): Secondary | ICD-10-CM | POA: Diagnosis not present

## 2016-10-01 DIAGNOSIS — Z7982 Long term (current) use of aspirin: Secondary | ICD-10-CM | POA: Insufficient documentation

## 2016-10-01 DIAGNOSIS — Z87891 Personal history of nicotine dependence: Secondary | ICD-10-CM | POA: Insufficient documentation

## 2016-10-01 DIAGNOSIS — I252 Old myocardial infarction: Secondary | ICD-10-CM | POA: Diagnosis not present

## 2016-10-01 DIAGNOSIS — Z880 Allergy status to penicillin: Secondary | ICD-10-CM | POA: Insufficient documentation

## 2016-10-01 DIAGNOSIS — I129 Hypertensive chronic kidney disease with stage 1 through stage 4 chronic kidney disease, or unspecified chronic kidney disease: Secondary | ICD-10-CM | POA: Insufficient documentation

## 2016-10-01 DIAGNOSIS — E7800 Pure hypercholesterolemia, unspecified: Secondary | ICD-10-CM | POA: Diagnosis not present

## 2016-10-01 DIAGNOSIS — I2510 Atherosclerotic heart disease of native coronary artery without angina pectoris: Secondary | ICD-10-CM | POA: Insufficient documentation

## 2016-10-01 DIAGNOSIS — K219 Gastro-esophageal reflux disease without esophagitis: Secondary | ICD-10-CM | POA: Insufficient documentation

## 2016-10-01 HISTORY — DX: Gastro-esophageal reflux disease without esophagitis: K21.9

## 2016-10-01 HISTORY — PX: AV FISTULA PLACEMENT: SHX1204

## 2016-10-01 LAB — POCT I-STAT 4, (NA,K, GLUC, HGB,HCT)
GLUCOSE: 111 mg/dL — AB (ref 65–99)
HCT: 36 % — ABNORMAL LOW (ref 39.0–52.0)
HEMOGLOBIN: 12.2 g/dL — AB (ref 13.0–17.0)
Potassium: 4.1 mmol/L (ref 3.5–5.1)
Sodium: 143 mmol/L (ref 135–145)

## 2016-10-01 LAB — GLUCOSE, CAPILLARY: GLUCOSE-CAPILLARY: 125 mg/dL — AB (ref 65–99)

## 2016-10-01 SURGERY — INSERTION OF ARTERIOVENOUS (AV) GORE-TEX GRAFT ARM
Anesthesia: General | Site: Arm Upper | Laterality: Left

## 2016-10-01 MED ORDER — MIDAZOLAM HCL 2 MG/2ML IJ SOLN
INTRAMUSCULAR | Status: DC | PRN
  Administered 2016-10-01: 1 mg via INTRAVENOUS

## 2016-10-01 MED ORDER — SODIUM CHLORIDE 0.9 % IV SOLN
INTRAVENOUS | Status: DC
  Administered 2016-10-01: 08:00:00 via INTRAVENOUS

## 2016-10-01 MED ORDER — NEOSTIGMINE METHYLSULFATE 10 MG/10ML IV SOLN
INTRAVENOUS | Status: DC | PRN
  Administered 2016-10-01: 3 mg via INTRAVENOUS
  Administered 2016-10-01: 2 mg via INTRAVENOUS

## 2016-10-01 MED ORDER — THROMBIN 20000 UNITS EX SOLR
CUTANEOUS | Status: AC
  Filled 2016-10-01: qty 20000

## 2016-10-01 MED ORDER — EPHEDRINE 5 MG/ML INJ
INTRAVENOUS | Status: AC
  Filled 2016-10-01: qty 10

## 2016-10-01 MED ORDER — LIDOCAINE 2% (20 MG/ML) 5 ML SYRINGE
INTRAMUSCULAR | Status: AC
  Filled 2016-10-01: qty 5

## 2016-10-01 MED ORDER — PROPOFOL 10 MG/ML IV BOLUS
INTRAVENOUS | Status: AC
  Filled 2016-10-01: qty 20

## 2016-10-01 MED ORDER — PROTAMINE SULFATE 10 MG/ML IV SOLN
INTRAVENOUS | Status: AC
  Filled 2016-10-01: qty 10

## 2016-10-01 MED ORDER — GLYCOPYRROLATE 0.2 MG/ML IJ SOLN
INTRAMUSCULAR | Status: DC | PRN
  Administered 2016-10-01: 0.6 mg via INTRAVENOUS
  Administered 2016-10-01: .2 mg via INTRAVENOUS

## 2016-10-01 MED ORDER — LIDOCAINE 2% (20 MG/ML) 5 ML SYRINGE
INTRAMUSCULAR | Status: DC | PRN
  Administered 2016-10-01: 60 mg via INTRAVENOUS

## 2016-10-01 MED ORDER — ONDANSETRON HCL 4 MG/2ML IJ SOLN
INTRAMUSCULAR | Status: AC
  Filled 2016-10-01: qty 2

## 2016-10-01 MED ORDER — OXYCODONE HCL 5 MG/5ML PO SOLN: 5.0000 mg | Freq: Once | ORAL | Status: AC | PRN

## 2016-10-01 MED ORDER — FENTANYL CITRATE (PF) 100 MCG/2ML IJ SOLN
INTRAMUSCULAR | Status: AC
  Filled 2016-10-01: qty 2

## 2016-10-01 MED ORDER — ROCURONIUM BROMIDE 100 MG/10ML IV SOLN
INTRAVENOUS | Status: DC | PRN
  Administered 2016-10-01: 40 mg via INTRAVENOUS

## 2016-10-01 MED ORDER — FENTANYL CITRATE (PF) 100 MCG/2ML IJ SOLN
INTRAMUSCULAR | Status: DC | PRN
  Administered 2016-10-01: 25 ug via INTRAVENOUS
  Administered 2016-10-01 (×4): 50 ug via INTRAVENOUS
  Administered 2016-10-01: 25 ug via INTRAVENOUS

## 2016-10-01 MED ORDER — OXYCODONE HCL 5 MG PO TABS
5.0000 mg | ORAL_TABLET | Freq: Once | ORAL | Status: AC | PRN
  Administered 2016-10-01: 5 mg via ORAL

## 2016-10-01 MED ORDER — FENTANYL CITRATE (PF) 100 MCG/2ML IJ SOLN
25.0000 ug | INTRAMUSCULAR | Status: DC | PRN
  Administered 2016-10-01 (×2): 25 ug via INTRAVENOUS
  Administered 2016-10-01: 50 ug via INTRAVENOUS

## 2016-10-01 MED ORDER — MIDAZOLAM HCL 2 MG/2ML IJ SOLN
INTRAMUSCULAR | Status: AC
  Filled 2016-10-01: qty 2

## 2016-10-01 MED ORDER — ROCURONIUM BROMIDE 10 MG/ML (PF) SYRINGE
PREFILLED_SYRINGE | INTRAVENOUS | Status: AC
  Filled 2016-10-01: qty 5

## 2016-10-01 MED ORDER — PROPOFOL 10 MG/ML IV BOLUS
INTRAVENOUS | Status: DC | PRN
Start: 2016-10-01 — End: 2016-10-01
  Administered 2016-10-01: 200 mg via INTRAVENOUS

## 2016-10-01 MED ORDER — OXYCODONE-ACETAMINOPHEN 5-325 MG PO TABS: 1.0000 | ORAL_TABLET | ORAL | 0 refills | Status: DC | PRN

## 2016-10-01 MED ORDER — OXYCODONE HCL 5 MG PO TABS
ORAL_TABLET | ORAL | Status: AC
  Filled 2016-10-01: qty 1

## 2016-10-01 MED ORDER — ONDANSETRON HCL 4 MG/2ML IJ SOLN
INTRAMUSCULAR | Status: DC | PRN
  Administered 2016-10-01: 4 mg via INTRAVENOUS

## 2016-10-01 MED ORDER — SODIUM CHLORIDE 0.9 % IV SOLN
INTRAVENOUS | Status: DC | PRN
  Administered 2016-10-01: 10:00:00

## 2016-10-01 MED ORDER — HEPARIN SODIUM (PORCINE) 1000 UNIT/ML IJ SOLN
INTRAMUSCULAR | Status: AC
  Filled 2016-10-01: qty 1

## 2016-10-01 MED ORDER — PHENYLEPHRINE 40 MCG/ML (10ML) SYRINGE FOR IV PUSH (FOR BLOOD PRESSURE SUPPORT)
PREFILLED_SYRINGE | INTRAVENOUS | Status: AC
  Filled 2016-10-01: qty 10

## 2016-10-01 MED ORDER — FENTANYL CITRATE (PF) 250 MCG/5ML IJ SOLN
INTRAMUSCULAR | Status: AC
  Filled 2016-10-01: qty 5

## 2016-10-01 MED ORDER — HEPARIN SODIUM (PORCINE) 1000 UNIT/ML IJ SOLN
INTRAMUSCULAR | Status: DC | PRN
  Administered 2016-10-01: 9000 [IU] via INTRAVENOUS

## 2016-10-01 MED ORDER — PROTAMINE SULFATE 10 MG/ML IV SOLN
INTRAVENOUS | Status: DC | PRN
  Administered 2016-10-01: 20 mg via INTRAVENOUS
  Administered 2016-10-01: 40 mg via INTRAVENOUS

## 2016-10-01 MED ORDER — SUCCINYLCHOLINE CHLORIDE 200 MG/10ML IV SOSY
PREFILLED_SYRINGE | INTRAVENOUS | Status: AC
  Filled 2016-10-01: qty 10

## 2016-10-01 MED ORDER — 0.9 % SODIUM CHLORIDE (POUR BTL) OPTIME
TOPICAL | Status: DC | PRN
  Administered 2016-10-01: 1000 mL

## 2016-10-01 MED ORDER — EPHEDRINE SULFATE-NACL 50-0.9 MG/10ML-% IV SOSY
PREFILLED_SYRINGE | INTRAVENOUS | Status: DC | PRN
  Administered 2016-10-01 (×2): 5 mg via INTRAVENOUS

## 2016-10-01 MED ORDER — THROMBIN 20000 UNITS EX SOLR
CUTANEOUS | Status: DC | PRN
  Administered 2016-10-01: 12:00:00 via TOPICAL

## 2016-10-01 MED ORDER — ONDANSETRON HCL 4 MG/2ML IJ SOLN: 4.0000 mg | Freq: Four times a day (QID) | INTRAMUSCULAR | Status: DC | PRN

## 2016-10-01 MED ORDER — SUCCINYLCHOLINE CHLORIDE 20 MG/ML IJ SOLN
INTRAMUSCULAR | Status: DC | PRN
  Administered 2016-10-01: 100 mg via INTRAVENOUS

## 2016-10-01 SURGICAL SUPPLY — 41 items
ARMBAND PINK RESTRICT EXTREMIT (MISCELLANEOUS) ×6 IMPLANT
CANISTER SUCT 3000ML PPV (MISCELLANEOUS) ×3 IMPLANT
CANNULA VESSEL 3MM 2 BLNT TIP (CANNULA) ×3 IMPLANT
CLIP VESOCCLUDE MED 6/CT (CLIP) ×3 IMPLANT
CLIP VESOCCLUDE SM WIDE 6/CT (CLIP) ×3 IMPLANT
COVER PROBE W GEL 5X96 (DRAPES) ×3 IMPLANT
DECANTER SPIKE VIAL GLASS SM (MISCELLANEOUS) ×3 IMPLANT
DERMABOND ADVANCED (GAUZE/BANDAGES/DRESSINGS) ×1
DERMABOND ADVANCED .7 DNX12 (GAUZE/BANDAGES/DRESSINGS) ×2 IMPLANT
ELECT REM PT RETURN 9FT ADLT (ELECTROSURGICAL) ×3
ELECTRODE REM PT RTRN 9FT ADLT (ELECTROSURGICAL) ×2 IMPLANT
GAUZE SPONGE 4X4 16PLY XRAY LF (GAUZE/BANDAGES/DRESSINGS) ×3 IMPLANT
GLOVE BIO SURGEON STRL SZ 6.5 (GLOVE) ×6 IMPLANT
GLOVE BIO SURGEON STRL SZ7.5 (GLOVE) ×3 IMPLANT
GLOVE BIOGEL PI IND STRL 6.5 (GLOVE) ×8 IMPLANT
GLOVE BIOGEL PI IND STRL 7.0 (GLOVE) ×2 IMPLANT
GLOVE BIOGEL PI IND STRL 8 (GLOVE) ×2 IMPLANT
GLOVE BIOGEL PI INDICATOR 6.5 (GLOVE) ×4
GLOVE BIOGEL PI INDICATOR 7.0 (GLOVE) ×1
GLOVE BIOGEL PI INDICATOR 8 (GLOVE) ×1
GLOVE ECLIPSE 6.5 STRL STRAW (GLOVE) ×3 IMPLANT
GLOVE SURG SS PI 6.5 STRL IVOR (GLOVE) ×3 IMPLANT
GOWN STRL NON-REIN LRG LVL3 (GOWN DISPOSABLE) ×3 IMPLANT
GOWN STRL REUS W/ TWL LRG LVL3 (GOWN DISPOSABLE) ×8 IMPLANT
GOWN STRL REUS W/TWL LRG LVL3 (GOWN DISPOSABLE) ×4
GRAFT GORETEX STRT 4-7X45 (Vascular Products) ×3 IMPLANT
KIT BASIN OR (CUSTOM PROCEDURE TRAY) ×3 IMPLANT
KIT ROOM TURNOVER OR (KITS) ×3 IMPLANT
NS IRRIG 1000ML POUR BTL (IV SOLUTION) ×3 IMPLANT
PACK CV ACCESS (CUSTOM PROCEDURE TRAY) ×3 IMPLANT
PAD ARMBOARD 7.5X6 YLW CONV (MISCELLANEOUS) ×6 IMPLANT
SPONGE SURGIFOAM ABS GEL 100 (HEMOSTASIS) ×3 IMPLANT
SUT PROLENE 6 0 BV (SUTURE) ×9 IMPLANT
SUT SILK 3 0 (SUTURE) ×1
SUT SILK 3-0 18XBRD TIE 12 (SUTURE) ×2 IMPLANT
SUT VIC AB 3-0 SH 27 (SUTURE) ×1
SUT VIC AB 3-0 SH 27X BRD (SUTURE) ×2 IMPLANT
SUT VIC AB 4-0 PS2 27 (SUTURE) ×3 IMPLANT
SUT VICRYL 4-0 PS2 18IN ABS (SUTURE) ×3 IMPLANT
UNDERPAD 30X30 (UNDERPADS AND DIAPERS) ×3 IMPLANT
WATER STERILE IRR 1000ML POUR (IV SOLUTION) ×3 IMPLANT

## 2016-10-01 NOTE — Progress Notes (Signed)
Patient requested that daughter, Fenton Foy, be informed that he is at [facility] and going into surgery.   Nurse informed Fenton Foy.

## 2016-10-01 NOTE — H&P (View-Only) (Signed)
Patient name: Richard Barnett MRN: [id number] DOB: [birth year] Sex: Richard Barnett   REASON FOR CONSULT:    Evaluate for hemodialysis access. The physician requesting the consult was Dr. Elvis Coil.  HPI:   Richard Barnett is a pleasant 61 y.o. Richard Barnett,  who was referred with stage IV chronic kidney disease. 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 Of note he is a poor historian. He is right-handed. He denies any recent uremic symptoms including nausea, vomiting, fatigue, anorexia, or palpitations.  He does not have a pacemaker. He has noted some intermittent right upper extremity swelling.  He does have a history of coronary artery disease and underwent PTCA in [place] in 2016 2017. He denies any recent chest pain or chest pressure.   Past Medical History:  Diagnosis Date  . Arthritis    "right leg" (08/22/2014)  . CAD (coronary artery disease)    a. CAD s/p 2 stent placement in [place] 2016. b. Neg nuc 05/2014 & 01/2016.  Marland Kitchen CKD (chronic kidney disease), stage IV (HCC)    Richard Barnett 08/22/2014  . Daily headache   . Depression    "I always get that" (08/22/2014)  . Diabetes mellitus (HCC)   . DVT (deep venous thrombosis) (HCC) ?   RLE  . Heart murmur   . Hypercholesterolemia   . Hypertension     Family History  Problem Relation Age of Onset  . Hypertension Mother   . Kidney disease Mother   . Heart disease Mother   . Heart disease Father   . Hypertension Father     SOCIAL HISTORY: Social History   Social History  . Marital status: Divorced    Spouse name: N/A  . Number of children: N/A  . Years of education: N/A   Occupational History  . Not on file.   Social History Main Topics  . Smoking status: Former Smoker    Packs/day: 0.00    Years: 40.00    Types: Cigarettes    Quit date: 08/29/2010  . Smokeless tobacco: Never Used     Comment: "quit smoking cigarettes in ~ 2011"  . Alcohol use No    Comment: 08/22/2014 "stopped in ~ 2011"  . Drug use: No     Comment: 08/22/2014 "stopped in ~ 2011; all types of drugs""  . Sexual activity: No   Other Topics Concern  . Not on file   Social History Narrative  . No narrative on file    Allergies  Allergen Reactions  . Penicillins Swelling and Rash    Swelling of whole body  Has patient had a PCN reaction causing immediate rash, facial/tongue/throat swelling, SOB or lightheadedness with hypotension: No Has patient had a PCN reaction causing severe rash involving mucus membranes or skin necrosis: No Has patient had a PCN reaction that required hospitalization No Has patient had a PCN reaction occurring within the last 10 years: No If all of the above answers are "NO", then may proceed with Cephalosporin use.     Current Outpatient Prescriptions  Medication Sig Dispense Refill  . amLODipine (NORVASC) 10 MG tablet TAKE ONE (1) TABLET BY MOUTH EVERY DAY (Patient taking differently: Take 20 mg by mouth daily. ) 30 tablet 0  . aspirin 81 MG EC tablet Take 1 tablet (81 mg total) by mouth daily. Swallow whole. 30 tablet 0  . atorvastatin (LIPITOR) 40 MG tablet TAKE ONE TABLET BY MOUTH EVERY EVENING  AT SIX P.M. (Patient taking differently: Take 40 mg by mouth daily. ) 30 tablet 0  . citalopram (CELEXA) 40 MG tablet Take 40 mg by mouth daily.    . cloNIDine (CATAPRES) 0.2 MG tablet TAKE ONE (1) TABLET BY MOUTH TWO (2) TIMES DAILY (Patient taking differently: Take 0.2 mg by mouth daily. ) 60 tablet 0  . glycerin adult 2 g suppository Place 1 suppository rectally as needed for constipation. 12 suppository 0  . isosorbide mononitrate (IMDUR) 30 MG 24 hr tablet Take 1 tablet (30 mg total) by mouth daily. 30 tablet 0  . metoprolol (LOPRESSOR) 50 MG tablet TAKE ONE (1) TABLET BY MOUTH TWO (2) TIMES DAILY (Patient taking differently: Take 50 mg by mouth daily. ) 60 tablet 0  . nitroGLYCERIN (NITROSTAT) 0.4 MG SL tablet DISSOLVE 1 TABLET UNDER THE  TONGUE AS NEEDED FOR CHEST PAIN. REPEAT AS NEEDED EVERY 5 MINUTES UP TO A TOTAL OF 3 DOSES 25 tablet 2  . omeprazole (PRILOSEC OTC) 20 MG tablet Take 1 tablet (20 mg total) by mouth daily. 30 tablet 1  . PRESCRIPTION MEDICATION Place into both eyes See admin instructions. 2 different eye drops from [facility] - instill 1 drop of each into both eyes every morning     No current facility-administered medications for this visit.     REVIEW OF SYSTEMS:  [X]  denotes positive finding, [ ]  denotes negative finding Cardiac  Comments:  Chest pain or chest pressure:    Shortness of breath upon exertion:    Short of breath when lying flat:    Irregular heart rhythm:        Vascular    Pain in calf, thigh, or hip brought on by ambulation:    Pain in feet at night that wakes you up from your sleep:     Blood clot in your veins:    Leg swelling:         Pulmonary    Oxygen at home:    Productive cough:     Wheezing:         Neurologic    Sudden weakness in arms or legs:     Sudden numbness in arms or legs:     Sudden onset of difficulty speaking or slurred speech:    Temporary loss of vision in one eye:     Problems with dizziness:         Gastrointestinal    Blood in stool:     Vomited blood:         Genitourinary    Burning when urinating:     Blood in urine:        Psychiatric    Major depression:         Hematologic    Bleeding problems:    Problems with blood clotting too easily:        Skin    Rashes or ulcers:        Constitutional    Fever or chills:     PHYSICAL EXAM:   Vitals:   09/16/16 1245  BP: 137/78  Pulse: (!) 51  Resp: 18  Temp: (!) 97 F (36.1 C)  SpO2: 98%  Weight: 228 lb (103.4 kg)  Height: 5\' 7"  (1.702 m)    GENERAL: The patient is a well-nourished Richard Barnett, in no acute distress. The vital signs are documented above. CARDIAC: There is a regular rate and rhythm.  VASCULAR: I do not detect carotid bruits. He has palpable brachial  and  radial pulses bilaterally. PULMONARY: There is good air exchange bilaterally without wheezing or rales. ABDOMEN: Soft and non-tender with normal pitched bowel sounds.  MUSCULOSKELETAL: There are no major deformities or cyanosis. NEUROLOGIC: No focal weakness or paresthesias are detected. SKIN: There are no ulcers or rashes noted. PSYCHIATRIC: The patient has a normal affect.  DATA:    UPPER EXTREMITY VEIN MAP: I have independently interpreted his upper extremity vein map today.  On the right side the forearm and upper arm cephalic vein are somewhat marginal in size. The basilic vein on the right looks reasonable in size.  On the left side the forearm cephalic vein and upper arm cephalic vein looked marginal in size. The basilic vein on the left is also marginal.  UPPER EXTREMITY ARTERIAL DUPLEX: I have independently interpreted his upper extremity arterial duplex  today.  On the right side there is a triphasic radial and ulnar waveform. Brachial artery on the right is 0.64 cm.  On the left side there is a triphasic radial and ulnar waveform. Brachial artery and the left is 0.57 cm.  LABS: On 08/11/2016, his creatinine clearance was 24.   MEDICAL ISSUES:   STAGE IV CHRONIC KIDNEY DISEASE: The patient has marginal veins bilaterally. Given that he has had some intermittent right upper external swelling I would favor attempting access in the left arm. He is right-handed. I will evaluate the forearm and upper arm cephalic vein on the left and that this is adequate to a radial cephalic or brachial cephalic fistula. If this is not adequate that he could potentially have a first stage basilic vein transposition. I have discussed the need to do this in 2 stages with him if this is ultimately what he requires. If none of his veins are adequate then I would place an AV graft.   I have explained the indications for placement of an AV fistula or AV graft. I've explained that if at all possible we  will place an AV fistula.  I have reviewed the risks of placement of an AV fistula including but not limited to: failure of the fistula to mature, need for subsequent interventions, and thrombosis. In addition I have reviewed the potential complications of placement of an AV graft. These risks include, but are not limited to, graft thrombosis, graft infection, wound healing problems, bleeding, arm swelling, and steal syndrome. All the patient's questions were answered and they are agreeable to proceed with surgery.  He would like to have his surgery done on a Friday and this has been scheduled for 10/01/2016. All of his questions were answered and he is agreeable to proceed.   [name] [facility] [phone]

## 2016-10-01 NOTE — Anesthesia Preprocedure Evaluation (Addendum)
Anesthesia Evaluation  Patient identified by MRN, date of birth, ID band Patient awake    Reviewed: Allergy & Precautions, H&P , NPO status , Patient's Chart, lab work & pertinent test results  Airway Mallampati: II   Neck ROM: full  Mouth opening: Limited Mouth Opening  Dental  (+) Dental Advisory Given, Teeth Intact   Pulmonary former smoker,    breath sounds clear to auscultation       Cardiovascular hypertension, + CAD, + Past MI and + Cardiac Stents   Rhythm:regular Rate:Normal     Neuro/Psych  Headaches, PSYCHIATRIC DISORDERS Depression    GI/Hepatic GERD  ,  Endo/Other  diabetes, Type 2  Renal/GU Renal InsufficiencyRenal disease     Musculoskeletal  (+) Arthritis ,   Abdominal   Peds  Hematology   Anesthesia Other Findings   Reproductive/Obstetrics                            Anesthesia Physical Anesthesia Plan  ASA: III  Anesthesia Plan: General   Post-op Pain Management:    Induction: Intravenous  PONV Risk Score and Plan: 2 and Ondansetron, Dexamethasone and Treatment may vary due to age or medical condition  Airway Management Planned: LMA  Additional Equipment:   Intra-op Plan:   Post-operative Plan:   Informed Consent: I have reviewed the patients History and Physical, chart, labs and discussed the procedure including the risks, benefits and alternatives for the proposed anesthesia with the patient or authorized representative who has indicated his/her understanding and acceptance.     Plan Discussed with: CRNA, Anesthesiologist and Surgeon  Anesthesia Plan Comments:         Anesthesia Quick Evaluation

## 2016-10-01 NOTE — Op Note (Signed)
    NAME: Richard Barnett    MRN: [id number] DOB: 1955/03/23    DATE OF OPERATION: 10/01/2016  PREOP DIAGNOSIS:    Stage IV chronic kidney disease  POSTOP DIAGNOSIS:    Same  PROCEDURE:    NEW LEFT UPPER ARM AV GRAFT (4-7 MM PTFE GRAFT)  SURGEON: Di Kindle. Edilia Bo, MD, FACS  ASSIST: Dorcas Carrow RNFA  ANESTHESIA: Gen.   EBL: Minimal  INDICATIONS:    Richard Barnett is a 61 y.o. Richard Barnett who is not yet on dialysis. He presents for new access.  FINDINGS:    By my ultrasound exam I did not think the forearm or upper arm cephalic vein were adequate in size. Likewise the basilic vein had multiple branches and was quite small. The brachial vein at the antecubital level likewise was small. I therefore elected to place an upper arm graft.  TECHNIQUE:    The patient was taken to the operating room and received general anesthetic. The left arm was prepped and draped in the usual sterile fashion. A longitudinal incision was made just above the antecubital level and here the brachial vein was dissected free. The vein was quite small and I dissected over a fairly long length but it did not appear to improve. The brachial artery was dissected free beneath the fascia. A separate longitudinal incision was made beneath the axilla and the high brachial vein was dissected free. This too was only about 4 mm in size. A 4-7 mm PTFE graft was tunneled between the 2 incisions and the patient was heparinized. The brachial artery was clamped proximally and distally and longitudinal arteriotomy was made. A segment of the 4 mm end of the graft was excised, the graft slightly spatulated, and sewn end-to-side to the brachial artery using continuous 6-0 Prolene suture. The graft and poorly propelling for anastomosis to the high brachial vein. This was ligated distally and spatulated proximally. The graft was cut to appropriate length, spatulated and sewn end to end to the high brachial vein using continuous 6-0  Prolene suture. At the completion was an excellent thrill in the graft and a good radial and ulnar signal with the Doppler. Hemostasis was obtained in the wounds and the wounds were closed deeply a few Vicryl and the skin closed with 4-0 Vicryl. Dermabond was applied. The patient tolerated the procedure well and was transferred to the recovery room in stable condition. All needle and sponge counts were correct.  [name], MD, FACS [facility]  DATE OF DICTATION:   10/01/2016

## 2016-10-01 NOTE — Progress Notes (Signed)
Per Dr. Chaney Malling patient does not need beta blocker this morning will treat blood pressure in the OR

## 2016-10-01 NOTE — Transfer of Care (Signed)
Immediate Anesthesia Transfer of Care Note  Patient: Richard Barnett  Procedure(s) Performed: Procedure(s): INSERTION OF 4-7MM X 45CM ARTERIOVENOUS (AV) GORE-TEX GRAFT ARM (Left)  Patient Location: PACU  Anesthesia Type:General  Level of Consciousness: drowsy and patient cooperative  Airway & Oxygen Therapy: Patient Spontanous Breathing and Patient connected to face mask oxygen  Post-op Assessment: Report given to RN, Post -op Vital signs reviewed and stable and Patient moving all extremities X 4  Post vital signs: Reviewed and stable  Last Vitals:  Vitals:   10/01/16 0740 10/01/16 1240  BP: (!) 192/99 128/84  Pulse:  83  Resp:  (!) 7  Temp:  36.5 C    Last Pain:  Vitals:   10/01/16 0811  TempSrc:   PainSc: 2       Patients Stated Pain Goal: 2 (10/01/16 6045)  Complications: No apparent anesthesia complications

## 2016-10-01 NOTE — Anesthesia Postprocedure Evaluation (Signed)
Anesthesia Post Note  Patient: Richard Barnett  Procedure(s) Performed: Procedure(s) (LRB): INSERTION OF 4-7MM X 45CM ARTERIOVENOUS (AV) GORE-TEX GRAFT ARM (Left)     Patient location during evaluation: PACU Anesthesia Type: General Level of consciousness: awake and alert and patient cooperative Pain management: pain level controlled Vital Signs Assessment: post-procedure vital signs reviewed and stable Respiratory status: spontaneous breathing and respiratory function stable Cardiovascular status: stable Anesthetic complications: no    Last Vitals:  Vitals:   10/01/16 1350 10/01/16 1400  BP: 127/83 123/81  Pulse: 70 71  Resp: 13 16  Temp:  36.6 C    Last Pain:  Vitals:   10/01/16 1340  TempSrc:   PainSc: Asleep                 Kaylah Chiasson S

## 2016-10-01 NOTE — Telephone Encounter (Signed)
-----   Message from Phillips Odor, RN sent at 10/01/2016 12:47 PM EDT ----- Regarding: needs 3-4 wk. f/u with [facility]   ----- Message ----- From: Chuck Hint, MD Sent: 10/01/2016  12:30 PM To: [facility] Charge Pool Subject: charge                                          PROCEDURE:   NEW LEFT UPPER ARM AV GRAFT (4-7 MM PTFE GRAFT)  SURGEON: Di Kindle. Edilia Bo, MD, FACS  ASSIST: Dorcas Carrow RNFA  He needs an office visit in 3-4 weeks to follow up his AV graft. Thank you. CD

## 2016-10-01 NOTE — Anesthesia Procedure Notes (Signed)
Procedure Name: Intubation Date/Time: 10/01/2016 10:47 AM Performed by: Julieta Bellini Pre-anesthesia Checklist: Patient identified, Emergency Drugs available, Suction available and Patient being monitored Patient Re-evaluated:Patient Re-evaluated prior to induction Oxygen Delivery Method: Circle system utilized Preoxygenation: Pre-oxygenation with 100% oxygen Induction Type: IV induction Ventilation: Mask ventilation with difficulty Laryngoscope Size: Mac and 4 Grade View: Grade II Tube type: Oral Tube size: 7.5 mm Number of attempts: 1 Airway Equipment and Method: Stylet Placement Confirmation: ETT inserted through vocal cords under direct vision,  positive ETCO2 and breath sounds checked- equal and bilateral Secured at: 24 cm Tube secured with: Tape Dental Injury: Teeth and Oropharynx as per pre-operative assessment  Comments: Intubation performed by Dr. Marcie Bal

## 2016-10-01 NOTE — Telephone Encounter (Signed)
Sched appt 10/27/16 at 10:15. Lm on hm#.

## 2016-10-01 NOTE — Anesthesia Procedure Notes (Signed)
Procedure Name: LMA Insertion Date/Time: 10/01/2016 10:27 AM Performed by: Burt Ek Pre-anesthesia Checklist: Patient identified, Emergency Drugs available, Suction available and Patient being monitored Patient Re-evaluated:Patient Re-evaluated prior to induction Oxygen Delivery Method: Circle system utilized Preoxygenation: Pre-oxygenation with 100% oxygen Induction Type: IV induction LMA: LMA inserted LMA Size: 5.0 Number of attempts: 1 Placement Confirmation: positive ETCO2 and breath sounds checked- equal and bilateral Tube secured with: Tape Dental Injury: Teeth and Oropharynx as per pre-operative assessment

## 2016-10-01 NOTE — Interval H&P Note (Signed)
History and Physical Interval Note:  10/01/2016 10:09 AM  Richard Barnett  has presented today for surgery, with the diagnosis of Chronic Kidney Disease Stage 4   The various methods of treatment have been discussed with the patient and family. After consideration of risks, benefits and other options for treatment, the patient has consented to  Procedure(s): LEFT ARM ARTERIOVENOUS (AV) FISTULA CREATION VERSUS INSERTION OF LEFT ARM ARTERIOVENOUS GRAFT (Left) as a surgical intervention .  The patient's history has been reviewed, patient examined, no change in status, stable for surgery.  I have reviewed the patient's chart and labs.  Questions were answered to the patient's satisfaction.     [name]

## 2016-10-04 ENCOUNTER — Encounter (HOSPITAL_COMMUNITY): Payer: Self-pay | Admitting: Vascular Surgery

## 2016-10-19 ENCOUNTER — Encounter: Payer: Self-pay | Admitting: Vascular Surgery

## 2016-10-27 ENCOUNTER — Encounter: Payer: Self-pay | Admitting: *Deleted

## 2016-10-27 ENCOUNTER — Encounter (HOSPITAL_COMMUNITY): Payer: Self-pay | Admitting: *Deleted

## 2016-10-27 ENCOUNTER — Ambulatory Visit (INDEPENDENT_AMBULATORY_CARE_PROVIDER_SITE_OTHER): Payer: Self-pay | Admitting: Vascular Surgery

## 2016-10-27 ENCOUNTER — Other Ambulatory Visit: Payer: Self-pay | Admitting: *Deleted

## 2016-10-27 ENCOUNTER — Encounter: Payer: Self-pay | Admitting: Vascular Surgery

## 2016-10-27 VITALS — BP 139/85 | HR 70 | Temp 97.3°F | Ht 67.0 in | Wt 232.0 lb

## 2016-10-27 DIAGNOSIS — N184 Chronic kidney disease, stage 4 (severe): Secondary | ICD-10-CM

## 2016-10-27 NOTE — Progress Notes (Signed)
Patient name: Richard Barnett MRN: [id number] DOB: [birth year] Sex: Richard Barnett  REASON FOR VISIT:    Follow up after placement of left upper arm AV graft.  HPI:   Richard Barnett is a pleasant 61 y.o. Richard Barnett was not yet on dialysis. He presented for new access. On 10/01/2016 he underwent placement of a new left upper arm AV graft (4-7 mm PTFE graft). The patient is not on dialysis. He denies fever or chills. His complaint is that he has been leaking clear fluid from the antecubital incision.  Current Outpatient Prescriptions  Medication Sig Dispense Refill  . amLODipine (NORVASC) 10 MG tablet TAKE ONE (1) TABLET BY MOUTH EVERY DAY 30 tablet 0  . aspirin 81 MG EC tablet Take 1 tablet (81 mg total) by mouth daily. Swallow whole. 30 tablet 0  . atorvastatin (LIPITOR) 40 MG tablet TAKE ONE TABLET BY MOUTH EVERY EVENING AT SIX P.M. (Patient taking differently: Take 40 mg by mouth daily. ) 30 tablet 0  . Brinzolamide-Brimonidine (SIMBRINZA) 1-0.2 % SUSP Place 1 drop into both eyes 2 (two) times daily.    . citalopram (CELEXA) 40 MG tablet Take 40 mg by mouth daily.    . cloNIDine (CATAPRES) 0.2 MG tablet TAKE ONE (1) TABLET BY MOUTH TWO (2) TIMES DAILY (Patient taking differently: Take 0.2 mg by mouth daily. ) 60 tablet 0  . Glycerin, Laxative, (GLYCERIN, CHILD,) 1.2 g SUPP Place 2.4 g rectally daily as needed (constipation).    . isosorbide mononitrate (IMDUR) 30 MG 24 hr tablet Take 1 tablet (30 mg total) by mouth daily. 30 tablet 0  . latanoprost (XALATAN) 0.005 % ophthalmic solution Place 1 drop into both eyes at bedtime.    . meloxicam (MOBIC) 15 MG tablet Take 15 mg by mouth daily.    . metoprolol (LOPRESSOR) 50 MG tablet TAKE ONE (1) TABLET BY MOUTH TWO (2) TIMES DAILY 60 tablet 0  . metoprolol succinate (TOPROL-XL) 100 MG 24 hr tablet Take 100 mg by mouth daily. Take with or immediately following a meal.    . nitroGLYCERIN (NITROSTAT) 0.4 MG SL tablet DISSOLVE 1 TABLET UNDER THE TONGUE AS NEEDED FOR  CHEST PAIN. REPEAT AS NEEDED EVERY 5 MINUTES UP TO A TOTAL OF 3 DOSES 25 tablet 2  . omeprazole (PRILOSEC OTC) 20 MG tablet Take 1 tablet (20 mg total) by mouth daily. 30 tablet 1  . oxyCODONE-acetaminophen (ROXICET) 5-325 MG tablet Take 1-2 tablets by mouth every 4 (four) hours as needed. 20 tablet 0  . tiZANidine (ZANAFLEX) 2 MG tablet Take 2-4 mg by mouth every 8 (eight) hours as needed for muscle spasms.     No current facility-administered medications for this visit.     REVIEW OF SYSTEMS:  [X]  denotes positive finding, [ ]  denotes negative finding Cardiac  Comments:  Chest pain or chest pressure:    Shortness of breath upon exertion:    Short of breath when lying flat:    Irregular heart rhythm:    Constitutional    Fever or chills:     PHYSICAL EXAM:   Vitals:   10/27/16 1029  BP: 139/85  Pulse: 70  Temp: (!) 97.3 F (36.3 C)  TempSrc: Oral  SpO2: 100%  Weight: 232 lb (105.2 kg)  Height: 5\' 7"  (1.702 m)    GENERAL: The patient is a well-nourished Richard Barnett, in no acute distress. The vital signs are documented above. CARDIOVASCULAR: There is a regular rate and rhythm. PULMONARY: There is good air exchange bilaterally  without wheezing or rales. His graft has a good thrill. He has a palpable left radial pulse. The axillary incision looks fine. The antecubital incision is leaking some clear lymphatic fluid.  DATA:   No new data.  MEDICAL ISSUES:   LYMPHOCELE STATUS POST LEFT AV GRAFT: This patient has had persistent lymphatic drainage from the antecubital incision. Incision is intact but given this persistent lymphatic leak I have recommended exploration of the wound. I've explained that this can be a recurrent problem that is difficult to manage in the worse case scenario that he would require a graft ligation. If he is seeping from just a segment of the graft and may consider replacing a segment of the graft. His surgery is scheduled for Friday, 10/29/2016. I have reviewed  the procedure but tender, occasions and he is agreeable to proceed.  [name] [facility] [phone]

## 2016-10-28 MED ORDER — SODIUM CHLORIDE 0.9 % IV SOLN
INTRAVENOUS | Status: DC
  Administered 2016-10-29 (×2): via INTRAVENOUS

## 2016-10-28 MED ORDER — VANCOMYCIN HCL 10 G IV SOLR
1500.0000 mg | INTRAVENOUS | Status: AC
  Administered 2016-10-29: 1500 mg via INTRAVENOUS
  Filled 2016-10-28: qty 1500

## 2016-10-28 NOTE — Progress Notes (Signed)
Pt completed SDW-Pre-op assessment however, pt requested that pre-op instructions be given to his Home Health Assistant, Nolon Stalls. Pt denies SOB and chest pain. Pt was unable to provide the name of his cardiologist. Pt was unable to give detailed info regarding when and where his cardiac cath was performed. Shela Nevin was given pre-op instructions and a follow-up call was made as requested to confirm AM medications; lvm. Shela Nevin was made aware to have pt stop taking otc vitamins, fish oil and herbal medications. Do not take any NSAIDs ie: Ibuprofen, Advil, Naproxen (Aleve), Motrin, Meloxicam (Mobic), BC and Goody Powder. Shela Nevin was made aware to check pt BG every 2 hours prior to arrival to hospital on DOS. Pt made aware to treat a BG < 70 with 4 ounces of apple juice, wait 15 minutes after intervention to recheck BG, if BG remains < 70, call Short Stay unit to speak with a nurse. [place] verbalized understanding of all pre-op instructions.

## 2016-10-29 ENCOUNTER — Ambulatory Visit (HOSPITAL_COMMUNITY)
Admission: RE | Admit: 2016-10-29 | Discharge: 2016-10-29 | Disposition: A | Discharge status: Home / Self Care | Payer: Medicaid Other | Source: Ambulatory Visit | Attending: Vascular Surgery | Admitting: Vascular Surgery

## 2016-10-29 ENCOUNTER — Encounter (HOSPITAL_COMMUNITY): Payer: Self-pay | Admitting: *Deleted

## 2016-10-29 ENCOUNTER — Ambulatory Visit (HOSPITAL_COMMUNITY): Payer: Medicaid Other | Admitting: Anesthesiology

## 2016-10-29 ENCOUNTER — Encounter (HOSPITAL_COMMUNITY)
Admission: RE | Disposition: A | Discharge status: Home / Self Care | Payer: Self-pay | Source: Ambulatory Visit | Attending: Vascular Surgery

## 2016-10-29 ENCOUNTER — Telehealth: Payer: Self-pay | Admitting: Vascular Surgery

## 2016-10-29 DIAGNOSIS — Y939 Activity, unspecified: Secondary | ICD-10-CM | POA: Insufficient documentation

## 2016-10-29 DIAGNOSIS — I898 Other specified noninfective disorders of lymphatic vessels and lymph nodes: Secondary | ICD-10-CM | POA: Diagnosis present

## 2016-10-29 DIAGNOSIS — Y929 Unspecified place or not applicable: Secondary | ICD-10-CM | POA: Diagnosis not present

## 2016-10-29 DIAGNOSIS — F329 Major depressive disorder, single episode, unspecified: Secondary | ICD-10-CM | POA: Insufficient documentation

## 2016-10-29 DIAGNOSIS — I252 Old myocardial infarction: Secondary | ICD-10-CM | POA: Insufficient documentation

## 2016-10-29 DIAGNOSIS — K219 Gastro-esophageal reflux disease without esophagitis: Secondary | ICD-10-CM | POA: Diagnosis not present

## 2016-10-29 DIAGNOSIS — Z87891 Personal history of nicotine dependence: Secondary | ICD-10-CM | POA: Insufficient documentation

## 2016-10-29 DIAGNOSIS — X58XXXA Exposure to other specified factors, initial encounter: Secondary | ICD-10-CM | POA: Insufficient documentation

## 2016-10-29 DIAGNOSIS — I2510 Atherosclerotic heart disease of native coronary artery without angina pectoris: Secondary | ICD-10-CM | POA: Insufficient documentation

## 2016-10-29 DIAGNOSIS — Z955 Presence of coronary angioplasty implant and graft: Secondary | ICD-10-CM | POA: Diagnosis not present

## 2016-10-29 DIAGNOSIS — I120 Hypertensive chronic kidney disease with stage 5 chronic kidney disease or end stage renal disease: Secondary | ICD-10-CM | POA: Diagnosis not present

## 2016-10-29 DIAGNOSIS — E1122 Type 2 diabetes mellitus with diabetic chronic kidney disease: Secondary | ICD-10-CM | POA: Insufficient documentation

## 2016-10-29 DIAGNOSIS — M1990 Unspecified osteoarthritis, unspecified site: Secondary | ICD-10-CM | POA: Diagnosis not present

## 2016-10-29 DIAGNOSIS — N186 End stage renal disease: Secondary | ICD-10-CM | POA: Insufficient documentation

## 2016-10-29 DIAGNOSIS — R51 Headache: Secondary | ICD-10-CM | POA: Diagnosis not present

## 2016-10-29 DIAGNOSIS — T82898A Other specified complication of vascular prosthetic devices, implants and grafts, initial encounter: Secondary | ICD-10-CM | POA: Insufficient documentation

## 2016-10-29 DIAGNOSIS — N185 Chronic kidney disease, stage 5: Secondary | ICD-10-CM | POA: Diagnosis not present

## 2016-10-29 HISTORY — PX: DRAINAGE AND CLOSURE OF LYMPHOCELE: SHX5800

## 2016-10-29 LAB — GLUCOSE, CAPILLARY
GLUCOSE-CAPILLARY: 120 mg/dL — AB (ref 65–99)
Glucose-Capillary: 126 mg/dL — ABNORMAL HIGH (ref 65–99)
Glucose-Capillary: 109 mg/dL — ABNORMAL HIGH (ref 65–99)

## 2016-10-29 LAB — POCT I-STAT 4, (NA,K, GLUC, HGB,HCT)
Glucose, Bld: 110 mg/dL — ABNORMAL HIGH (ref 65–99)
HEMATOCRIT: 31 % — AB (ref 39.0–52.0)
HEMOGLOBIN: 10.5 g/dL — AB (ref 13.0–17.0)
Potassium: 4.5 mmol/L (ref 3.5–5.1)
Sodium: 141 mmol/L (ref 135–145)

## 2016-10-29 SURGERY — DRAINAGE AND CLOSURE OF LYMPHOCELE
Anesthesia: General | Site: Arm Upper | Laterality: Left

## 2016-10-29 MED ORDER — LIDOCAINE-EPINEPHRINE (PF) 1 %-1:200000 IJ SOLN
INTRAMUSCULAR | Status: AC
  Filled 2016-10-29: qty 30

## 2016-10-29 MED ORDER — LIDOCAINE HCL (CARDIAC) 20 MG/ML IV SOLN
INTRAVENOUS | Status: DC | PRN
  Administered 2016-10-29: 40 mg via INTRAVENOUS

## 2016-10-29 MED ORDER — FENTANYL CITRATE (PF) 100 MCG/2ML IJ SOLN
INTRAMUSCULAR | Status: DC | PRN
  Administered 2016-10-29: 50 ug via INTRAVENOUS

## 2016-10-29 MED ORDER — CHLORHEXIDINE GLUCONATE 4 % EX LIQD: 60.0000 mL | Freq: Once | CUTANEOUS | Status: DC

## 2016-10-29 MED ORDER — HYDROMORPHONE HCL 1 MG/ML IJ SOLN
INTRAMUSCULAR | Status: AC
  Filled 2016-10-29: qty 1

## 2016-10-29 MED ORDER — HYDROMORPHONE HCL 1 MG/ML IJ SOLN
0.2500 mg | INTRAMUSCULAR | Status: DC | PRN
  Administered 2016-10-29 (×2): 0.5 mg via INTRAVENOUS

## 2016-10-29 MED ORDER — MIDAZOLAM HCL 2 MG/2ML IJ SOLN
INTRAMUSCULAR | Status: AC
  Filled 2016-10-29: qty 2

## 2016-10-29 MED ORDER — PROPOFOL 500 MG/50ML IV EMUL
INTRAVENOUS | Status: DC | PRN
  Administered 2016-10-29: 50 ug/kg/min via INTRAVENOUS

## 2016-10-29 MED ORDER — LIDOCAINE-EPINEPHRINE (PF) 1 %-1:200000 IJ SOLN
INTRAMUSCULAR | Status: DC | PRN
  Administered 2016-10-29: 30 mL

## 2016-10-29 MED ORDER — SODIUM CHLORIDE 0.9 % IR SOLN
Status: DC | PRN
  Administered 2016-10-29: 1000 mL

## 2016-10-29 MED ORDER — THROMBIN 20000 UNITS EX SOLR
CUTANEOUS | Status: AC
  Filled 2016-10-29: qty 20000

## 2016-10-29 MED ORDER — FENTANYL CITRATE (PF) 250 MCG/5ML IJ SOLN
INTRAMUSCULAR | Status: AC
  Filled 2016-10-29: qty 5

## 2016-10-29 MED ORDER — PROPOFOL 10 MG/ML IV BOLUS
INTRAVENOUS | Status: DC | PRN
  Administered 2016-10-29: 20 mg via INTRAVENOUS

## 2016-10-29 MED ORDER — LIDOCAINE 2% (20 MG/ML) 5 ML SYRINGE
INTRAMUSCULAR | Status: AC
  Filled 2016-10-29: qty 5

## 2016-10-29 MED ORDER — ONDANSETRON HCL 4 MG/2ML IJ SOLN
INTRAMUSCULAR | Status: AC
  Filled 2016-10-29: qty 2

## 2016-10-29 MED ORDER — MIDAZOLAM HCL 5 MG/5ML IJ SOLN
INTRAMUSCULAR | Status: DC | PRN
  Administered 2016-10-29: 2 mg via INTRAVENOUS

## 2016-10-29 MED ORDER — OXYCODONE-ACETAMINOPHEN 5-325 MG PO TABS: 1.0000 | ORAL_TABLET | Freq: Four times a day (QID) | ORAL | 0 refills | Status: DC | PRN

## 2016-10-29 MED ORDER — PROMETHAZINE HCL 25 MG/ML IJ SOLN: 6.2500 mg | INTRAMUSCULAR | Status: DC | PRN

## 2016-10-29 MED ORDER — PROPOFOL 10 MG/ML IV BOLUS
INTRAVENOUS | Status: AC
  Filled 2016-10-29: qty 20

## 2016-10-29 MED ORDER — ONDANSETRON HCL 4 MG/2ML IJ SOLN
INTRAMUSCULAR | Status: DC | PRN
  Administered 2016-10-29: 4 mg via INTRAVENOUS

## 2016-10-29 MED ORDER — SODIUM CHLORIDE 0.9 % IV SOLN
INTRAVENOUS | Status: DC | PRN
  Administered 2016-10-29: 13:00:00

## 2016-10-29 MED ORDER — THROMBIN 20000 UNITS EX SOLR
OROMUCOSAL | Status: DC | PRN
  Administered 2016-10-29: 13:00:00 via TOPICAL

## 2016-10-29 SURGICAL SUPPLY — 29 items
BANDAGE ELASTIC 4 VELCRO ST LF (GAUZE/BANDAGES/DRESSINGS) ×3 IMPLANT
BNDG GAUZE ELAST 4 BULKY (GAUZE/BANDAGES/DRESSINGS) ×3 IMPLANT
CANISTER SUCT 3000ML PPV (MISCELLANEOUS) ×3 IMPLANT
CANNULA VESSEL 3MM 2 BLNT TIP (CANNULA) ×3 IMPLANT
CLIP TI MEDIUM 6 (CLIP) ×3 IMPLANT
CLIP TI WIDE RED SMALL 6 (CLIP) ×3 IMPLANT
DECANTER SPIKE VIAL GLASS SM (MISCELLANEOUS) ×3 IMPLANT
ELECT REM PT RETURN 9FT ADLT (ELECTROSURGICAL) ×3
ELECTRODE REM PT RTRN 9FT ADLT (ELECTROSURGICAL) ×1 IMPLANT
GAUZE SPONGE 4X4 12PLY STRL LF (GAUZE/BANDAGES/DRESSINGS) ×3 IMPLANT
GLOVE BIO SURGEON STRL SZ7.5 (GLOVE) ×3 IMPLANT
GLOVE BIOGEL PI IND STRL 8 (GLOVE) ×1 IMPLANT
GLOVE BIOGEL PI INDICATOR 8 (GLOVE) ×2
GOWN STRL REUS W/ TWL LRG LVL3 (GOWN DISPOSABLE) ×3 IMPLANT
GOWN STRL REUS W/TWL LRG LVL3 (GOWN DISPOSABLE) ×6
KIT BASIN OR (CUSTOM PROCEDURE TRAY) ×3 IMPLANT
KIT ROOM TURNOVER OR (KITS) ×3 IMPLANT
NS IRRIG 1000ML POUR BTL (IV SOLUTION) ×6 IMPLANT
PACK CV ACCESS (CUSTOM PROCEDURE TRAY) ×3 IMPLANT
PAD ARMBOARD 7.5X6 YLW CONV (MISCELLANEOUS) ×6 IMPLANT
SPONGE SURGIFOAM ABS GEL 100 (HEMOSTASIS) ×3 IMPLANT
SUT ETHILON 3 0 PS 1 (SUTURE) ×6 IMPLANT
SUT PROLENE 6 0 BV (SUTURE) ×3 IMPLANT
SUT VIC AB 3-0 SH 27 (SUTURE) ×2
SUT VIC AB 3-0 SH 27X BRD (SUTURE) ×1 IMPLANT
SUT VICRYL 4-0 PS2 18IN ABS (SUTURE) ×3 IMPLANT
TAPE CLOTH SURG 4X10 WHT LF (GAUZE/BANDAGES/DRESSINGS) ×3 IMPLANT
UNDERPAD 30X30 (UNDERPADS AND DIAPERS) ×3 IMPLANT
WATER STERILE IRR 1000ML POUR (IV SOLUTION) ×3 IMPLANT

## 2016-10-29 NOTE — Telephone Encounter (Signed)
-----   Message from Sharee Pimple, RN sent at 10/29/2016  1:01 PM EDT ----- Regarding: 2 weeks for postop and stitches   ----- Message ----- From: Lars Mage, PA-C Sent: 10/29/2016  12:57 PM To: Vvs Charge Pool  F/U with Dr. Edilia Bo in 2 weeks s/p exploration of right UE will need stiches removed.

## 2016-10-29 NOTE — Telephone Encounter (Signed)
Sched appt 11/10/16 at 1:30. Lm on hm#.

## 2016-10-29 NOTE — Interval H&P Note (Signed)
History and Physical Interval Note:  10/29/2016 11:43 AM  Richard Barnett  has presented today for surgery, with the diagnosis of left arm lymphocele  The various methods of treatment have been discussed with the patient and family. After consideration of risks, benefits and other options for treatment, the patient has consented to  Procedure(s): EVACUATION OF LYMPHOCELE LEFT ARM ARTERIOVENOUS GRAFT (Left) as a surgical intervention .  The patient's history has been reviewed, patient examined, no change in status, stable for surgery.  I have reviewed the patient's chart and labs.  Questions were answered to the patient's satisfaction.     [name]

## 2016-10-29 NOTE — Progress Notes (Signed)
Wallet inventoried, verified by patient and 2 nurses, and taken to security. 2 Belongings bags with clothes taken to PACU.

## 2016-10-29 NOTE — Anesthesia Preprocedure Evaluation (Addendum)
Anesthesia Evaluation  Patient identified by MRN, date of birth, ID band Patient awake    Reviewed: Allergy & Precautions, H&P , NPO status , Patient's Chart, lab work & pertinent test results  History of Anesthesia Complications Negative for: history of anesthetic complications  Airway Mallampati: II   Neck ROM: full  Mouth opening: Limited Mouth Opening  Dental  (+) Dental Advisory Given, Teeth Intact   Pulmonary former smoker,    breath sounds clear to auscultation       Cardiovascular hypertension, + CAD, + Past MI and + Cardiac Stents   Rhythm:regular Rate:Normal     Neuro/Psych  Headaches, PSYCHIATRIC DISORDERS Depression    GI/Hepatic GERD  ,  Endo/Other  diabetes, Type 2  Renal/GU Renal InsufficiencyRenal disease     Musculoskeletal  (+) Arthritis ,   Abdominal   Peds  Hematology   Anesthesia Other Findings   Reproductive/Obstetrics                             Anesthesia Physical  Anesthesia Plan  ASA: III  Anesthesia Plan: MAC   Post-op Pain Management:    Induction: Intravenous  PONV Risk Score and Plan: 2 and Ondansetron, Dexamethasone and Treatment may vary due to age or medical condition  Airway Management Planned: Natural Airway and Simple Face Mask  Additional Equipment:   Intra-op Plan:   Post-operative Plan: Extubation in OR  Informed Consent: I have reviewed the patients History and Physical, chart, labs and discussed the procedure including the risks, benefits and alternatives for the proposed anesthesia with the patient or authorized representative who has indicated his/her understanding and acceptance.   Dental advisory given  Plan Discussed with: CRNA, Anesthesiologist and Surgeon  Anesthesia Plan Comments:        Anesthesia Quick Evaluation

## 2016-10-29 NOTE — Anesthesia Postprocedure Evaluation (Signed)
Anesthesia Post Note  Patient: Richard Barnett  Procedure(s) Performed: Procedure(s) (LRB): EVACUATION OF LYMPHOCELE LEFT ARM ARTERIOVENOUS GRAFT (Left)     Patient location during evaluation: PACU Anesthesia Type: MAC Level of consciousness: awake Pain management: pain level controlled Vital Signs Assessment: post-procedure vital signs reviewed and stable Respiratory status: spontaneous breathing and respiratory function stable Cardiovascular status: stable Anesthetic complications: no    Last Vitals:  Vitals:   10/29/16 1435 10/29/16 1443  BP: 120/79   Pulse: (!) 59 (!) 58  Resp: 15 17  Temp:  36.6 C  SpO2: 92% 94%    Last Pain:  Vitals:   10/29/16 1400  TempSrc:   PainSc: Asleep                 Carrera Kiesel DANIEL

## 2016-10-29 NOTE — Transfer of Care (Signed)
Immediate Anesthesia Transfer of Care Note  Patient: Richard Barnett  Procedure(s) Performed: Procedure(s): EVACUATION OF LYMPHOCELE LEFT ARM ARTERIOVENOUS GRAFT (Left)  Patient Location: PACU  Anesthesia Type:MAC  Level of Consciousness: awake  Airway & Oxygen Therapy: Patient Spontanous Breathing and Patient connected to nasal cannula oxygen  Post-op Assessment: Report given to RN and Post -op Vital signs reviewed and stable  Post vital signs: Reviewed and stable  Last Vitals:  Vitals:   10/29/16 0914 10/29/16 1321  BP: 121/82   Pulse: 62   Resp: 18   Temp: 36.5 C 36.4 C  SpO2: 100%     Last Pain:  Vitals:   10/29/16 1321  TempSrc:   PainSc: Asleep         Complications: No apparent anesthesia complications

## 2016-10-29 NOTE — H&P (View-Only) (Signed)
Patient name: Richard Barnett MRN: [id number] DOB: [birth year] Sex: Domingo  REASON FOR VISIT:    Follow up after placement of left upper arm AV graft.  HPI:   Richard Barnett is a pleasant 61 y.o. [place] was not yet on dialysis. He presented for new access. On 10/01/2016 he underwent placement of a new left upper arm AV graft (4-7 mm PTFE graft). The patient is not on dialysis. He denies fever or chills. His complaint is that he has been leaking clear fluid from the antecubital incision.  Current Outpatient Prescriptions  Medication Sig Dispense Refill  . amLODipine (NORVASC) 10 MG tablet TAKE ONE (1) TABLET BY MOUTH EVERY DAY 30 tablet 0  . aspirin 81 MG EC tablet Take 1 tablet (81 mg total) by mouth daily. Swallow whole. 30 tablet 0  . atorvastatin (LIPITOR) 40 MG tablet TAKE ONE TABLET BY MOUTH EVERY EVENING AT SIX P.M. (Patient taking differently: Take 40 mg by mouth daily. ) 30 tablet 0  . Brinzolamide-Brimonidine (SIMBRINZA) 1-0.2 % SUSP Place 1 drop into both eyes 2 (two) times daily.    . citalopram (CELEXA) 40 MG tablet Take 40 mg by mouth daily.    . cloNIDine (CATAPRES) 0.2 MG tablet TAKE ONE (1) TABLET BY MOUTH TWO (2) TIMES DAILY (Patient taking differently: Take 0.2 mg by mouth daily. ) 60 tablet 0  . Glycerin, Laxative, (GLYCERIN, CHILD,) 1.2 g SUPP Place 2.4 g rectally daily as needed (constipation).    . isosorbide mononitrate (IMDUR) 30 MG 24 hr tablet Take 1 tablet (30 mg total) by mouth daily. 30 tablet 0  . latanoprost (XALATAN) 0.005 % ophthalmic solution Place 1 drop into both eyes at bedtime.    . meloxicam (MOBIC) 15 MG tablet Take 15 mg by mouth daily.    . metoprolol (LOPRESSOR) 50 MG tablet TAKE ONE (1) TABLET BY MOUTH TWO (2) TIMES DAILY 60 tablet 0  . metoprolol succinate (TOPROL-XL) 100 MG 24 hr tablet Take 100 mg by mouth daily. Take with or immediately following a meal.    . nitroGLYCERIN (NITROSTAT) 0.4 MG SL tablet DISSOLVE 1 TABLET UNDER THE TONGUE AS NEEDED FOR  CHEST PAIN. REPEAT AS NEEDED EVERY 5 MINUTES UP TO A TOTAL OF 3 DOSES 25 tablet 2  . omeprazole (PRILOSEC OTC) 20 MG tablet Take 1 tablet (20 mg total) by mouth daily. 30 tablet 1  . oxyCODONE-acetaminophen (ROXICET) 5-325 MG tablet Take 1-2 tablets by mouth every 4 (four) hours as needed. 20 tablet 0  . tiZANidine (ZANAFLEX) 2 MG tablet Take 2-4 mg by mouth every 8 (eight) hours as needed for muscle spasms.     No current facility-administered medications for this visit.     REVIEW OF SYSTEMS:  [X]  denotes positive finding, [ ]  denotes negative finding Cardiac  Comments:  Chest pain or chest pressure:    Shortness of breath upon exertion:    Short of breath when lying flat:    Irregular heart rhythm:    Constitutional    Fever or chills:     PHYSICAL EXAM:   Vitals:   10/27/16 1029  BP: 139/85  Pulse: 70  Temp: (!) 97.3 F (36.3 C)  TempSrc: Oral  SpO2: 100%  Weight: 232 lb (105.2 kg)  Height: 5\' 7"  (1.702 m)    GENERAL: The patient is a well-nourished Richard Barnett, in no acute distress. The vital signs are documented above. CARDIOVASCULAR: There is a regular rate and rhythm. PULMONARY: There is good air exchange bilaterally  without wheezing or rales. His graft has a good thrill. He has a palpable left radial pulse. The axillary incision looks fine. The antecubital incision is leaking some clear lymphatic fluid.  DATA:   No new data.  MEDICAL ISSUES:   LYMPHOCELE STATUS POST LEFT AV GRAFT: This patient has had persistent lymphatic drainage from the antecubital incision. Incision is intact but given this persistent lymphatic leak I have recommended exploration of the wound. I've explained that this can be a recurrent problem that is difficult to manage in the worse case scenario that he would require a graft ligation. If he is seeping from just a segment of the graft and may consider replacing a segment of the graft. His surgery is scheduled for Friday, 10/29/2016. I have reviewed  the procedure but tender, occasions and he is agreeable to proceed.  [name] [facility] [phone]

## 2016-10-29 NOTE — Op Note (Signed)
    NAME: Sandy KAZIMER GRESSER    MRN: [id number] DOB: 20-Aug-1955    DATE OF OPERATION: 10/29/2016  PREOP DIAGNOSIS:    Lymphocele left upper arm AVG   POSTOP DIAGNOSIS:    Same  PROCEDURE:    Evacuation of lymphocele left upper arm AVG  SURGEON: Di Kindle. Edilia Bo, MD, FACS  ASSIST: Lianne Cure PA  ANESTHESIA: Local with sedation   EBL: Minimal   INDICATIONS:    Richard Barnett is a 61 y.o. [place] who had a left upper arm graft placed. He came in for a wound check and was draining some clear lymphatic fluid from the incision. This going on for several weeks. I recommended exploration and evacuation of lymphocele.  FINDINGS:    There was no significant lymphatic drainage from the graft itself. No obvious lymphatic leakage was noted.  TECHNIQUE:    The patient was taken to the operative room and sedated by anesthesia. The left upper extremity was prepped and draped in usual sterile fashion. After the skin was anesthetized, the incision at the antecubital level was open. There was a small lymphocele which was evacuated. The dissection was carried down to the AV graft. There was no serous drainage through the graft itself. No obvious source of lymphatic drainage was noted. The wound was irrigated with saline and hemostasis obtained. I closed the wound with 2 deep layers of 3-0 Vicryl. The skin was closed with interrupted 3-0 nylon's. A sterile dressing was applied. The patient tolerated the procedure well and was transferred to the recovery room in stable condition. All needle and sponge counts were correct.  [name], MD, FACS [facility]  DATE OF DICTATION:   10/29/2016

## 2016-10-30 ENCOUNTER — Encounter (HOSPITAL_COMMUNITY): Payer: Self-pay | Admitting: Vascular Surgery

## 2016-11-02 ENCOUNTER — Other Ambulatory Visit: Payer: Self-pay | Admitting: Internal Medicine

## 2016-11-03 ENCOUNTER — Encounter: Payer: Self-pay | Admitting: Vascular Surgery

## 2016-11-09 ENCOUNTER — Encounter (HOSPITAL_COMMUNITY): Payer: Self-pay | Admitting: Emergency Medicine

## 2016-11-09 ENCOUNTER — Emergency Department (HOSPITAL_COMMUNITY): Payer: Medicaid Other

## 2016-11-09 ENCOUNTER — Emergency Department (HOSPITAL_COMMUNITY)
Admission: EM | Admit: 2016-11-09 | Discharge: 2016-11-09 | Disposition: A | Discharge status: Home / Self Care | Payer: Medicaid Other | Attending: Emergency Medicine | Admitting: Emergency Medicine

## 2016-11-09 ENCOUNTER — Encounter: Payer: Self-pay | Admitting: Vascular Surgery

## 2016-11-09 DIAGNOSIS — M25461 Effusion, right knee: Secondary | ICD-10-CM | POA: Insufficient documentation

## 2016-11-09 DIAGNOSIS — I2510 Atherosclerotic heart disease of native coronary artery without angina pectoris: Secondary | ICD-10-CM | POA: Insufficient documentation

## 2016-11-09 DIAGNOSIS — M25561 Pain in right knee: Secondary | ICD-10-CM | POA: Diagnosis present

## 2016-11-09 DIAGNOSIS — N184 Chronic kidney disease, stage 4 (severe): Secondary | ICD-10-CM | POA: Insufficient documentation

## 2016-11-09 DIAGNOSIS — Z791 Long term (current) use of non-steroidal anti-inflammatories (NSAID): Secondary | ICD-10-CM | POA: Insufficient documentation

## 2016-11-09 DIAGNOSIS — Z87891 Personal history of nicotine dependence: Secondary | ICD-10-CM | POA: Insufficient documentation

## 2016-11-09 DIAGNOSIS — I129 Hypertensive chronic kidney disease with stage 1 through stage 4 chronic kidney disease, or unspecified chronic kidney disease: Secondary | ICD-10-CM | POA: Diagnosis not present

## 2016-11-09 DIAGNOSIS — Z79899 Other long term (current) drug therapy: Secondary | ICD-10-CM | POA: Diagnosis not present

## 2016-11-09 DIAGNOSIS — E119 Type 2 diabetes mellitus without complications: Secondary | ICD-10-CM | POA: Diagnosis not present

## 2016-11-09 DIAGNOSIS — G8929 Other chronic pain: Secondary | ICD-10-CM | POA: Diagnosis not present

## 2016-11-09 MED ORDER — ACETAMINOPHEN 325 MG PO TABS: 325.0000 mg | ORAL_TABLET | Freq: Once | ORAL | Status: DC

## 2016-11-09 MED ORDER — ATORVASTATIN CALCIUM 40 MG PO TABS: ORAL_TABLET | ORAL | 0 refills | Status: DC

## 2016-11-09 MED ORDER — CLONIDINE HCL 0.2 MG PO TABS
0.2000 mg | ORAL_TABLET | Freq: Every day | ORAL | Status: DC
  Administered 2016-11-09: 0.2 mg via ORAL
  Filled 2016-11-09: qty 1

## 2016-11-09 MED ORDER — AMLODIPINE BESYLATE 10 MG PO TABS: ORAL_TABLET | ORAL | 0 refills | Status: DC

## 2016-11-09 MED ORDER — ISOSORBIDE MONONITRATE ER 30 MG PO TB24: 30.0000 mg | ORAL_TABLET | Freq: Every day | ORAL | 0 refills | Status: DC

## 2016-11-09 MED ORDER — CLONIDINE HCL 0.2 MG PO TABS: ORAL_TABLET | ORAL | 0 refills | Status: DC

## 2016-11-09 MED ORDER — METOPROLOL TARTRATE 50 MG PO TABS: ORAL_TABLET | ORAL | 0 refills | Status: DC

## 2016-11-09 MED ORDER — AMLODIPINE BESYLATE 5 MG PO TABS
10.0000 mg | ORAL_TABLET | Freq: Once | ORAL | Status: AC
  Administered 2016-11-09: 10 mg via ORAL
  Filled 2016-11-09: qty 2

## 2016-11-09 NOTE — Discharge Instructions (Signed)
You were evaluated in the emergency department for right knee pain and swelling. You reported 1 fall directly on your right knee 3 months ago. Your x-ray shows some age-related arthritis and a small collection of fluid in your knee, I suspect this is from the recent fall. There is no evidence of a joint infection today. Please wear your knee sleeve daily for the next week. You can take 325 mg of Tylenol every 8 hours for pain. Ice, rest and elevate your leg to decrease swelling. Return to the ED P develop worsening swelling, pain, redness, warmth, fever.  Your home medications have been refilled. Take your medications daily.   Contact [facility] to establish care with a primary care provider for regular, routine medical care.  This clinic accepts patients without medical insurance. A primary care provider can adjust your daily medications and give you refills.

## 2016-11-09 NOTE — ED Triage Notes (Signed)
Pt has right knee pain that started two days ago. No injury. Pt ambulates with limp. Slightly swollen.

## 2016-11-09 NOTE — ED Provider Notes (Signed)
[facility]-EMERGENCY DEPT Provider Note   CSN: [id number] Arrival date & time: 11/09/16  1436     History   Chief Complaint Chief Complaint  Patient presents with  . Knee Pain    HPI Jacarie CARLON CHALOUX is a 61 y.o. Bohdi with ppmh of right knee arthritis and DVT presents to ED for evaluation of constant, achy, anterior right knee pain and swelling x 2 days. Exacerbated by bending and weight bearing. Has not tried anything OTC or ice, nothing makes it better. States he fell on his right knee 3 months ago but not since, otherwise no trauma. No fevers, chills, sweats, calf pain or LE edema. Denies IVDU or h/o gout. Denies numbness, tingling or weakness distally.   HPI  Past Medical History:  Diagnosis Date  . Arthritis    "right leg" (08/22/2014)  . CAD (coronary artery disease)    a. CAD s/p 2 stent placement in [place] 2016. b. Neg nuc 05/2014 & 01/2016.  Marland Kitchen CKD (chronic kidney disease), stage IV (HCC)    [name] 08/22/2014  . Daily headache   . Depression    "I always get that" (08/22/2014)  . Diabetes mellitus (HCC)   . DVT (deep venous thrombosis) (HCC) ?   RLE  . GERD (gastroesophageal reflux disease)   . Heart murmur   . Hypercholesterolemia   . Hypertension   . Myocardial infarction (HCC)   . Wears glasses     Patient Active Problem List   Diagnosis Date Noted  . Diabetes mellitus (HCC)   . Anemia of chronic renal failure   . Secondary hyperparathyroidism of renal origin (HCC)   . Proteinuria   . Arthritis   . Depression   . Deep vein thrombosis (HCC)   . Myocardial infarction (HCC)   . Heart failure, unspecified (HCC)   . Heart murmur   . Prediabetes 02/22/2015  . Chronic kidney disease, stage IV (severe) (HCC)   . Hyperlipidemia 05/22/2014  . Chest pain 05/21/2014  . Hypokalemia 05/21/2014  . CAD (coronary artery disease) 05/21/2014  . Hypertension 05/21/2014    Past Surgical History:  Procedure Laterality Date  . AV FISTULA PLACEMENT Left 10/01/2016   Procedure:  INSERTION OF 4-7MM X 45CM ARTERIOVENOUS (AV) GORE-TEX GRAFT ARM;  Surgeon: Chuck Hint, MD;  Location: [facility] OR;  Service: Vascular;  Laterality: Left;  . CORONARY ANGIOPLASTY WITH STENT PLACEMENT     "1 + 1"  . DRAINAGE AND CLOSURE OF LYMPHOCELE Left 10/29/2016   Procedure: EVACUATION OF LYMPHOCELE LEFT ARM ARTERIOVENOUS GRAFT;  Surgeon: Chuck Hint, MD;  Location: [facility] OR;  Service: Vascular;  Laterality: Left;       Home Medications    Prior to Admission medications   Medication Sig Start Date End Date Taking? Authorizing Provider  amLODipine (NORVASC) 10 MG tablet TAKE ONE (1) TABLET BY MOUTH EVERY DAY 11/09/16   Liberty Handy, PA-C  aspirin 81 MG EC tablet Take 1 tablet (81 mg total) by mouth daily. Swallow whole. 02/10/16   Massie Maroon, FNP  atorvastatin (LIPITOR) 40 MG tablet TAKE ONE TABLET BY MOUTH EVERY EVENING AT SIX P.M. 11/09/16   Liberty Handy, PA-C  Brinzolamide-Brimonidine [facility]) 1-0.2 % SUSP Place 1 drop into both eyes 2 (two) times daily.    [provider]  citalopram (CELEXA) 40 MG tablet Take 40 mg by mouth daily.    [provider]  cloNIDine (CATAPRES) 0.2 MG tablet TAKE ONE (1) TABLET BY MOUTH TWO (2) TIMES  DAILY 11/09/16   Liberty Handy, PA-C  Glycerin, Laxative, (GLYCERIN, CHILD,) 1.2 g SUPP Place 2.4 g rectally daily as needed (constipation).    [provider]  isosorbide mononitrate (IMDUR) 30 MG 24 hr tablet Take 1 tablet (30 mg total) by mouth daily. 11/09/16   Liberty Handy, PA-C  latanoprost (XALATAN) 0.005 % ophthalmic solution Place 1 drop into both eyes at bedtime.    [provider]  meloxicam (MOBIC) 15 MG tablet Take 15 mg by mouth daily.    [provider]  metoprolol tartrate (LOPRESSOR) 50 MG tablet TAKE ONE (1) TABLET BY MOUTH TWO (2) TIMES DAILY 11/09/16   Liberty Handy, PA-C  nitroGLYCERIN (NITROSTAT) 0.4 MG SL tablet DISSOLVE 1 TABLET UNDER THE TONGUE AS NEEDED  FOR CHEST PAIN. REPEAT AS NEEDED EVERY 5 MINUTES UP TO A TOTAL OF 3 DOSES 12/15/15   Henrietta Hoover, NP  omeprazole (PRILOSEC OTC) 20 MG tablet Take 1 tablet (20 mg total) by mouth daily. 08/11/16 08/11/17  Thomasene Lot, MD  oxyCODONE-acetaminophen (PERCOCET/ROXICET) 5-325 MG tablet Take 1 tablet by mouth every 6 (six) hours as needed. 10/29/16   Lars Mage, PA-C  tiZANidine (ZANAFLEX) 2 MG tablet Take 2-4 mg by mouth every 8 (eight) hours as needed for muscle spasms.    [provider]    Family History Family History  Problem Relation Age of Onset  . Hypertension Mother   . Kidney disease Mother   . Heart disease Mother   . Heart disease Father   . Hypertension Father     Social History Social History  Substance Use Topics  . Smoking status: Former Smoker    Packs/day: 0.00    Years: 40.00    Types: Cigarettes    Quit date: 08/29/2010  . Smokeless tobacco: Never Used     Comment: "quit smoking cigarettes in  2011" 10/27/16  . Alcohol use No     Comment: 10/27/16 "stopped in ~ 2011"     Allergies   Penicillins   Review of Systems Review of Systems  Constitutional: Negative for chills, diaphoresis and fever.  Musculoskeletal: Positive for arthralgias and joint swelling.  Skin: Negative for color change and wound.  Neurological: Negative for weakness and numbness.     Physical Exam Updated Vital Signs BP (!) 175/94 (BP Location: Left Arm)   Pulse 72   Temp 98.2 F (36.8 C) (Oral)   Resp 17   SpO2 100%   Physical Exam  Cardiovascular:  Pulses:      Popliteal pulses are 2+ on the right side, and 2+ on the left side.       Dorsalis pedis pulses are 2+ on the right side, and 2+ on the left side.       Posterior tibial pulses are 2+ on the right side, and 2+ on the left side.  No LE edema Calves are supple and non tender  Musculoskeletal:       Right knee: He exhibits swelling, effusion and erythema. Tenderness found.  Mild suprapatellar effusion,  mildly tender without overlaying erythema, warmth or fluctuance Full passive ROM of knees bilaterally with normal patellar J tracking bilaterally, pain with R knee full flexion Minimal lateral joint line tenderness.   No bony tenderness over patella, fibular head or tibial tuberosity.   No tenderness over MCL, LCL, patellar tendon or quadriceps tendon.    Negative Lachman's. Negative posterior drawer test.  Negative McMurray's. No varus or valgus laxity.  No  crepitus with knee ROM.  Patient able to bear weight in ED (4+ steps)  Neurological:  5/5 strength with knee flexion and extension, bilaterally.  5/5 strength with ankle dorsiflexion and plantar flexion, bilaterally.  Sensation to light touch intact in lower extremities      ED Treatments / Results  Labs (all labs ordered are listed, but only abnormal results are displayed) Labs Reviewed - No data to display  EKG  EKG Interpretation None       Radiology Dg Knee Complete 4 Views Right  Result Date: 11/09/2016 CLINICAL DATA:  Pain and swelling EXAM: RIGHT KNEE - COMPLETE 4+ VIEW COMPARISON:  04/04/2016 FINDINGS: No evidence of fracture. No subluxation or dislocation. Degenerative spurring is seen in all 3 compartments, most advanced medially. Joint effusion evident in the suprapatellar bursa. IMPRESSION: Tricompartmental degenerative changes with joint effusion. Electronically Signed   By: Kennith Center M.D.   On: 11/09/2016 17:07    Procedures Procedures (including critical care time)  Medications Ordered in ED Medications  cloNIDine (CATAPRES) tablet 0.2 mg (0.2 mg Oral Given 11/09/16 1743)  acetaminophen (TYLENOL) tablet 325 mg (not administered)  amLODipine (NORVASC) tablet 10 mg (10 mg Oral Given 11/09/16 1743)     Initial Impression / Assessment and Plan / ED Course  I have reviewed the triage vital signs and the nursing notes.  Pertinent labs & imaging results that were available during my care of the patient were  reviewed by me and considered in my medical decision making (see chart for details).    61 y.o. year old Joh with h/o significant for right knee arthritis, DVT, CKD prepping to start dialysis soon presents to ED for right knee pain and swelling x 2 days. One fall on right knee 3 months ago.  No fevers or chills. No h/o IVDU. Not a prosthetic joint. No URI or GI illness. On exam, there is suprapatellar effusion with mild tenderness only. No warmth, fluctuance or evidence of overlaying cellulitis. Only mild pain with full ROM. Initial differential diagnosis includes septic arthritis, reactive arthritis, gout, however higher suspicion for OA or soft tissue injury. No h/o high risk sexual practices, generalized rash or GU symptoms to raise suspicion for gonococcal arthritis.  X-ray shows degenerative disease and joint effusion, which has been noted in previous x-rays of right knee. Will d/c with knee sleeve, ice, elevate and tylenol.   Final Clinical Impressions(s) / ED Diagnoses   Final diagnoses:  Chronic pain of right knee  Effusion of right knee    New Prescriptions Current Discharge Medication List       Jerrell Mylar 11/09/16 Debera Lat, MD 11/10/16 1534

## 2016-11-10 ENCOUNTER — Encounter: Payer: Medicaid Other | Admitting: Vascular Surgery

## 2016-11-15 ENCOUNTER — Encounter: Payer: Self-pay | Admitting: Family

## 2016-11-15 ENCOUNTER — Ambulatory Visit (INDEPENDENT_AMBULATORY_CARE_PROVIDER_SITE_OTHER): Payer: Self-pay | Admitting: Family

## 2016-11-15 ENCOUNTER — Encounter: Payer: Self-pay | Admitting: Surgery

## 2016-11-15 VITALS — BP 134/74 | HR 69 | Temp 97.1°F | Resp 18 | Ht 67.0 in | Wt 236.0 lb

## 2016-11-15 DIAGNOSIS — Z4802 Encounter for removal of sutures: Secondary | ICD-10-CM

## 2016-11-15 DIAGNOSIS — Z992 Dependence on renal dialysis: Secondary | ICD-10-CM

## 2016-11-15 DIAGNOSIS — N186 End stage renal disease: Secondary | ICD-10-CM

## 2016-11-15 NOTE — Progress Notes (Signed)
Postoperative Access Visit   History of Present Illness  Richard Barnett is a 61 y.o. 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 Edilia Bo. Pt had a left upper arm graft placed on 10-01-16. He came in for a wound check and was draining some clear lymphatic fluid from the incision. This went on for several weeks. Dr. Edilia Bo recommended exploration and evacuation of lymphocele. He returns today for 2 week follow up and removal of sutures.   He has never had dialysis. He does not have a temporary catheter. Pt states his nephrologist told him he would start HD within a month.   The patient's right arm incision is healed with mild and shallow separation of skin, but mostly granulated.  The patient notes mild steal symptoms in his right hand.  The patient is able to complete their activities of daily living.    For VQI Use Only  PRE-ADM LIVING: Home  AMB STATUS: Ambulatory   Past Medical History:  Diagnosis Date  . Arthritis    "right leg" (08/22/2014)  . CAD (coronary artery disease)    a. CAD s/p 2 stent placement in [place] 2016. b. Neg nuc 05/2014 & 01/2016.  Marland Kitchen CKD (chronic kidney disease), stage IV (HCC)    [name] 08/22/2014  . Daily headache   . Depression    "I always get that" (08/22/2014)  . Diabetes mellitus (HCC)   . DVT (deep venous thrombosis) (HCC) ?   RLE  . GERD (gastroesophageal reflux disease)   . Heart murmur   . Hypercholesterolemia   . Hypertension   . Myocardial infarction (HCC)   . Wears glasses     Past Surgical History:  Procedure Laterality Date  . AV FISTULA PLACEMENT Left 10/01/2016   Procedure: INSERTION OF 4-7MM X 45CM ARTERIOVENOUS (AV) GORE-TEX GRAFT ARM;  Surgeon: Chuck Hint, MD;  Location: [facility] OR;  Service: Vascular;  Laterality: Left;  . CORONARY ANGIOPLASTY WITH STENT PLACEMENT     "1 + 1"  . DRAINAGE AND CLOSURE OF LYMPHOCELE Left 10/29/2016   Procedure: EVACUATION OF LYMPHOCELE LEFT ARM  ARTERIOVENOUS GRAFT;  Surgeon: Chuck Hint, MD;  Location: [facility] OR;  Service: Vascular;  Laterality: Left;    Social History   Social History  . Marital status: Divorced    Spouse name: N/A  . Number of children: N/A  . Years of education: N/A   Occupational History  . Not on file.   Social History Main Topics  . Smoking status: Former Smoker    Packs/day: 0.00    Years: 40.00    Types: Cigarettes    Quit date: 08/29/2010  . Smokeless tobacco: Never Used     Comment: "quit smoking cigarettes in  2011" 10/27/16  . Alcohol use No     Comment: 10/27/16 "stopped in ~ 2011"  . Drug use: No     Comment: 10/27/16 "stopped in ~ 2011; all types of drugs""  . Sexual activity: No   Other Topics Concern  . Not on file   Social History Narrative  . No narrative on file    Allergies  Allergen Reactions  . Penicillins Swelling and Rash    ANGIOEDEMA "SWELLING OF ENTIRE BODY"  Has patient had a PCN reaction causing immediate rash, facial/tongue/throat swelling, SOB or lightheadedness with hypotension: No Has patient had a PCN reaction causing severe rash involving mucus membranes or skin necrosis: No Has patient had a  PCN reaction that required hospitalization No Has patient had a PCN reaction occurring within the last 10 years: No If all of the above answers are "NO", then may proceed with Cephalosporin use.     Current Outpatient Prescriptions on File Prior to Visit  Medication Sig Dispense Refill  . amLODipine (NORVASC) 10 MG tablet TAKE ONE (1) TABLET BY MOUTH EVERY DAY 30 tablet 0  . aspirin 81 MG EC tablet Take 1 tablet (81 mg total) by mouth daily. Swallow whole. 30 tablet 0  . atorvastatin (LIPITOR) 40 MG tablet TAKE ONE TABLET BY MOUTH EVERY EVENING AT SIX P.M. 30 tablet 0  . Brinzolamide-Brimonidine (SIMBRINZA) 1-0.2 % SUSP Place 1 drop into both eyes 2 (two) times daily.    . citalopram (CELEXA) 40 MG tablet Take 40 mg by mouth daily.    . cloNIDine (CATAPRES)  0.2 MG tablet TAKE ONE (1) TABLET BY MOUTH TWO (2) TIMES DAILY 60 tablet 0  . Glycerin, Laxative, (GLYCERIN, CHILD,) 1.2 g SUPP Place 2.4 g rectally daily as needed (constipation).    . isosorbide mononitrate (IMDUR) 30 MG 24 hr tablet Take 1 tablet (30 mg total) by mouth daily. 30 tablet 0  . latanoprost (XALATAN) 0.005 % ophthalmic solution Place 1 drop into both eyes at bedtime.    . meloxicam (MOBIC) 15 MG tablet Take 15 mg by mouth daily.    . metoprolol tartrate (LOPRESSOR) 50 MG tablet TAKE ONE (1) TABLET BY MOUTH TWO (2) TIMES DAILY 60 tablet 0  . nitroGLYCERIN (NITROSTAT) 0.4 MG SL tablet DISSOLVE 1 TABLET UNDER THE TONGUE AS NEEDED FOR CHEST PAIN. REPEAT AS NEEDED EVERY 5 MINUTES UP TO A TOTAL OF 3 DOSES 25 tablet 2  . omeprazole (PRILOSEC OTC) 20 MG tablet Take 1 tablet (20 mg total) by mouth daily. 30 tablet 1  . oxyCODONE-acetaminophen (PERCOCET/ROXICET) 5-325 MG tablet Take 1 tablet by mouth every 6 (six) hours as needed. 6 tablet 0  . tiZANidine (ZANAFLEX) 2 MG tablet Take 2-4 mg by mouth every 8 (eight) hours as needed for muscle spasms.     No current facility-administered medications on file prior to visit.      Physical Examination Vitals:   11/15/16 1348  BP: 134/74  Pulse: 69  Resp: 18  Temp: (!) 97.1 F (36.2 C)  TempSrc: Oral  SpO2: 96%  Weight: 236 lb (107 kg)  Height:  (1.702 m)   Body mass index is 36.96 kg/m.  Left UE: Incision is healed except for superficial separation of skin, skin feels warm and normal, hand grip is 5/5, sensation in digits is intact, palpable thrill, bruit can  be auscultated.   Medical Decision Making  Richard Barnett is a 61 y.o. 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 Edilia Bo. Pt had a left upper arm graft placed on 10-01-16.  I discussed with Dr. Myra Gianotti pt HPI and physical exam results.  Left arm graft may be accessed BUT MUST AVOID ACCESSING AT THE INCISION UNTIL FULLY  HEALED.   All sutures removed and steri strips applied to the mostly healed incision.  Follow up with [place] as needed.   Thank you for allowing [place] to participate in this patient's care.  NICKEL, Carma Lair, RN, MSN, FNP-C [facility] Office: [phone]  11/15/2016, 1:56 PM  Clinic MD: Myra Gianotti

## 2016-11-28 ENCOUNTER — Emergency Department (HOSPITAL_COMMUNITY)
Admission: EM | Admit: 2016-11-28 | Discharge: 2016-11-28 | Disposition: A | Discharge status: Home / Self Care | Payer: Medicaid Other | Attending: Emergency Medicine | Admitting: Emergency Medicine

## 2016-11-28 ENCOUNTER — Encounter (HOSPITAL_COMMUNITY): Payer: Self-pay | Admitting: Emergency Medicine

## 2016-11-28 ENCOUNTER — Emergency Department (HOSPITAL_COMMUNITY): Payer: Medicaid Other

## 2016-11-28 ENCOUNTER — Emergency Department (HOSPITAL_BASED_OUTPATIENT_CLINIC_OR_DEPARTMENT_OTHER)
Admit: 2016-11-28 | Discharge: 2016-11-28 | Disposition: A | Discharge status: Home / Self Care | Payer: Medicaid Other | Attending: Emergency Medicine | Admitting: Emergency Medicine

## 2016-11-28 DIAGNOSIS — I130 Hypertensive heart and chronic kidney disease with heart failure and stage 1 through stage 4 chronic kidney disease, or unspecified chronic kidney disease: Secondary | ICD-10-CM | POA: Diagnosis not present

## 2016-11-28 DIAGNOSIS — M79609 Pain in unspecified limb: Secondary | ICD-10-CM | POA: Diagnosis not present

## 2016-11-28 DIAGNOSIS — I509 Heart failure, unspecified: Secondary | ICD-10-CM | POA: Insufficient documentation

## 2016-11-28 DIAGNOSIS — N184 Chronic kidney disease, stage 4 (severe): Secondary | ICD-10-CM | POA: Insufficient documentation

## 2016-11-28 DIAGNOSIS — I2510 Atherosclerotic heart disease of native coronary artery without angina pectoris: Secondary | ICD-10-CM | POA: Diagnosis not present

## 2016-11-28 DIAGNOSIS — W19XXXA Unspecified fall, initial encounter: Secondary | ICD-10-CM | POA: Diagnosis not present

## 2016-11-28 DIAGNOSIS — Z87891 Personal history of nicotine dependence: Secondary | ICD-10-CM | POA: Insufficient documentation

## 2016-11-28 DIAGNOSIS — Z79899 Other long term (current) drug therapy: Secondary | ICD-10-CM | POA: Diagnosis not present

## 2016-11-28 DIAGNOSIS — M25561 Pain in right knee: Secondary | ICD-10-CM | POA: Insufficient documentation

## 2016-11-28 DIAGNOSIS — Z955 Presence of coronary angioplasty implant and graft: Secondary | ICD-10-CM | POA: Insufficient documentation

## 2016-11-28 DIAGNOSIS — R2241 Localized swelling, mass and lump, right lower limb: Secondary | ICD-10-CM | POA: Insufficient documentation

## 2016-11-28 DIAGNOSIS — Z7982 Long term (current) use of aspirin: Secondary | ICD-10-CM | POA: Insufficient documentation

## 2016-11-28 DIAGNOSIS — M7989 Other specified soft tissue disorders: Secondary | ICD-10-CM | POA: Diagnosis not present

## 2016-11-28 DIAGNOSIS — E1122 Type 2 diabetes mellitus with diabetic chronic kidney disease: Secondary | ICD-10-CM | POA: Insufficient documentation

## 2016-11-28 LAB — BASIC METABOLIC PANEL
ANION GAP: 8 (ref 5–15)
BUN: 33 mg/dL — ABNORMAL HIGH (ref 6–20)
CALCIUM: 8.7 mg/dL — AB (ref 8.9–10.3)
CO2: 26 mmol/L (ref 22–32)
Chloride: 108 mmol/L (ref 101–111)
Creatinine, Ser: 3.95 mg/dL — ABNORMAL HIGH (ref 0.61–1.24)
GFR, EST AFRICAN AMERICAN: 17 mL/min — AB (ref >60)
GFR, EST NON AFRICAN AMERICAN: 15 mL/min — AB (ref >60)
Glucose, Bld: 134 mg/dL — ABNORMAL HIGH (ref 65–99)
Potassium: 4.8 mmol/L (ref 3.5–5.1)
Sodium: 142 mmol/L (ref 135–145)

## 2016-11-28 MED ORDER — ACETAMINOPHEN 500 MG PO TABS
1000.0000 mg | ORAL_TABLET | Freq: Once | ORAL | Status: AC
  Administered 2016-11-28: 1000 mg via ORAL
  Filled 2016-11-28: qty 2

## 2016-11-28 NOTE — ED Triage Notes (Addendum)
Per PTAR. Pt complains of R leg pain for the past 3 days after falling. PTAR noticed R ankle swelling. Pt able to bear weight with PTAR. PTAR also reports he has missed his dialysis appointment 4 days ago

## 2016-11-28 NOTE — Progress Notes (Signed)
VASCULAR LAB PRELIMINARY  PRELIMINARY  PRELIMINARY  PRELIMINARY  Bilateral lower extremity venous duplex completed.    Preliminary report:  There is no DVT or SVT noted in the bilateral lower extremities.   Jonpaul Lumm, RVT 11/28/2016, 6:10 PM

## 2016-11-28 NOTE — ED Provider Notes (Signed)
History reports right knee pain for one month he reports that he fell twice as result of difficulty in weightbearing. No treatment prior to coming here. He also reports that he was supposed to be at dialysis for the first time 4 days ago but did not go because his leg was hurting. He denies shortness of breath denies fever denies chest On exam patient is alert. No distress lungs clear auscultation abdomen nondistended nontender right lower extremity is tender diffusely over the knee. There is no redness, leg is not hot or cold in comparison to left leg. DP pulse 2+. Good capillary refill    Doug Sou, MD 11/28/16 [phone]

## 2016-11-28 NOTE — Discharge Instructions (Signed)
You can take tylenol as needed for your pain.  Do not take more than two pills of tylenol every 8 hours.

## 2016-11-28 NOTE — ED Provider Notes (Signed)
[facility]-EMERGENCY DEPT Provider Note   CSN: [id number] Arrival date & time: 11/28/16  0957     History   Chief Complaint Chief Complaint  Patient presents with  . Leg Injury    HPI Richard Barnett is a 61 y.o. [place] with a history of cognitive delay, CK D stage IV was supposed to start dialysis on the 19th however missed the appointment, CAD, right knee arthritis, DVT, MI, hypertension, who presents today for evaluation of right knee pain. He reports that twice this morning he attempted to stand on his leg when the pain caused his leg to collapse.  He denies any injuries from the fall.  He reports he is concerned about his knee.    Chart review shows that he has been seen multiple times for right knee pain in the past with negative work ups.  He has not seen orthopedics for his knee pain.   He reports he is unsure where he was supposed to go for his first dialysis on the 19th and requests we call the office (he doesn't know which one) to find out what he has to do.  He reports that he was given papers but says he lost them.    HPI  Past Medical History:  Diagnosis Date  . Arthritis    "right leg" (08/22/2014)  . CAD (coronary artery disease)    a. CAD s/p 2 stent placement in [place] 2016. b. Neg nuc 05/2014 & 01/2016.  Marland Kitchen CKD (chronic kidney disease), stage IV (HCC)    [name] 08/22/2014  . Daily headache   . Depression    "I always get that" (08/22/2014)  . Diabetes mellitus (HCC)   . DVT (deep venous thrombosis) (HCC) ?   RLE  . GERD (gastroesophageal reflux disease)   . Heart murmur   . Hypercholesterolemia   . Hypertension   . Myocardial infarction (HCC)   . Wears glasses     Patient Active Problem List   Diagnosis Date Noted  . Diabetes mellitus (HCC)   . Anemia of chronic renal failure   . Secondary hyperparathyroidism of renal origin (HCC)   . Proteinuria   . Arthritis   . Depression   . Deep vein thrombosis (HCC)   . Myocardial infarction (HCC)   . Heart failure,  unspecified (HCC)   . Heart murmur   . Prediabetes 02/22/2015  . Chronic kidney disease, stage IV (severe) (HCC)   . Hyperlipidemia 05/22/2014  . Chest pain 05/21/2014  . Hypokalemia 05/21/2014  . CAD (coronary artery disease) 05/21/2014  . Hypertension 05/21/2014    Past Surgical History:  Procedure Laterality Date  . AV FISTULA PLACEMENT Left 10/01/2016   Procedure: INSERTION OF 4-7MM X 45CM ARTERIOVENOUS (AV) GORE-TEX GRAFT ARM;  Surgeon: Chuck Hint, MD;  Location: [facility] OR;  Service: Vascular;  Laterality: Left;  . CORONARY ANGIOPLASTY WITH STENT PLACEMENT     "1 + 1"  . DRAINAGE AND CLOSURE OF LYMPHOCELE Left 10/29/2016   Procedure: EVACUATION OF LYMPHOCELE LEFT ARM ARTERIOVENOUS GRAFT;  Surgeon: Chuck Hint, MD;  Location: [facility] OR;  Service: Vascular;  Laterality: Left;       Home Medications    Prior to Admission medications   Medication Sig Start Date End Date Taking? Authorizing Provider  amLODipine (NORVASC) 10 MG tablet TAKE ONE (1) TABLET BY MOUTH EVERY DAY 11/09/16  Yes Liberty Handy, PA-C  aspirin 81 MG EC tablet Take 1 tablet (81 mg total) by mouth daily. Swallow whole.  02/10/16  Yes Massie Maroon, FNP  atorvastatin (LIPITOR) 40 MG tablet TAKE ONE TABLET BY MOUTH EVERY EVENING AT SIX P.M. 11/09/16  Yes Liberty Handy, PA-C  Brinzolamide-Brimonidine [facility]) 1-0.2 % SUSP Place 1 drop into both eyes 2 (two) times daily.   Yes [provider]  citalopram (CELEXA) 40 MG tablet Take 40 mg by mouth daily.   Yes [provider]  cloNIDine (CATAPRES) 0.2 MG tablet TAKE ONE (1) TABLET BY MOUTH TWO (2) TIMES DAILY 11/09/16  Yes Liberty Handy, PA-C  isosorbide mononitrate (IMDUR) 30 MG 24 hr tablet Take 1 tablet (30 mg total) by mouth daily. 11/09/16  Yes Sharen Heck J, PA-C  latanoprost (XALATAN) 0.005 % ophthalmic solution Place 1 drop into both eyes at bedtime.   Yes [provider]  meloxicam (MOBIC) 15 MG tablet  Take 15 mg by mouth daily.   Yes [provider]  metoprolol tartrate (LOPRESSOR) 50 MG tablet TAKE ONE (1) TABLET BY MOUTH TWO (2) TIMES DAILY 11/09/16  Yes Sharen Heck J, PA-C  nitroGLYCERIN (NITROSTAT) 0.4 MG SL tablet DISSOLVE 1 TABLET UNDER THE TONGUE AS NEEDED FOR CHEST PAIN. REPEAT AS NEEDED EVERY 5 MINUTES UP TO A TOTAL OF 3 DOSES 12/15/15  Yes Henrietta Hoover, NP  oxyCODONE-acetaminophen (PERCOCET/ROXICET) 5-325 MG tablet Take 1 tablet by mouth every 6 (six) hours as needed. 10/29/16  Yes Lars Mage, PA-C  tiZANidine (ZANAFLEX) 2 MG tablet Take 2-4 mg by mouth every 8 (eight) hours as needed for muscle spasms.   Yes [provider]  omeprazole (PRILOSEC OTC) 20 MG tablet Take 1 tablet (20 mg total) by mouth daily. Patient not taking: Reported on 11/28/2016 08/11/16 08/11/17  Thomasene Lot, MD    Family History Family History  Problem Relation Age of Onset  . Hypertension Mother   . Kidney disease Mother   . Heart disease Mother   . Heart disease Father   . Hypertension Father     Social History Social History  Substance Use Topics  . Smoking status: Former Smoker    Packs/day: 0.00    Years: 40.00    Types: Cigarettes    Quit date: 08/29/2010  . Smokeless tobacco: Never Used     Comment: "quit smoking cigarettes in  2011" 10/27/16  . Alcohol use No     Comment: 10/27/16 "stopped in ~ 2011"     Allergies   Penicillins   Review of Systems Review of Systems  Constitutional: Negative for chills and fever.  HENT: Negative for ear pain and sore throat.   Eyes: Negative for pain and visual disturbance.  Respiratory: Negative for cough and shortness of breath.   Cardiovascular: Negative for chest pain and palpitations.  Gastrointestinal: Negative for abdominal pain, nausea and vomiting.  Genitourinary: Negative for dysuria and hematuria.  Musculoskeletal: Positive for arthralgias. Negative for back pain.  Skin: Negative for color change and rash.    Neurological: Negative for seizures and syncope.  All other systems reviewed and are negative.    Physical Exam Updated Vital Signs BP (!) 155/91   Pulse (!) 56   Temp 98.1 F (36.7 C) (Oral)   Resp 16   SpO2 99%   Physical Exam  Constitutional: He appears well-developed and well-nourished. No distress.  HENT:  Head: Normocephalic and atraumatic.  Eyes: Conjunctivae are normal. Right eye exhibits no discharge. Left eye exhibits no discharge. No scleral icterus.  Neck: Normal range of motion.  Cardiovascular: Normal rate, regular  rhythm and intact distal pulses.   2+ DP and PT pulses bilaterally.  Both feet are warm and well perfused.   Dialysis fistula present to left upper extremity, appears to be healing normally without abnormal erythema, ecchymosis, or edema.  Pulmonary/Chest: Effort normal. No stridor. No respiratory distress.  Abdominal: He exhibits no distension.  Musculoskeletal: Normal range of motion. He exhibits no edema or deformity.  RLE: Patient has full AROM to right knee, 5 out of 5 strength to right lower extremity.  There is tenderness along the posterior aspect of the right knee. Right knee is grossly stable to anterior/posterior drawer test, valgus/varus stress.  Patella tracks normally. No crepitus felt with ROM.   Neurological: He is alert. No sensory deficit. He exhibits normal muscle tone.  Skin: Skin is warm and dry. He is not diaphoretic.  Psychiatric: He has a normal mood and affect. His behavior is normal.  Nursing note and vitals reviewed.    ED Treatments / Results  Labs (all labs ordered are listed, but only abnormal results are displayed) Labs Reviewed  BASIC METABOLIC PANEL - Abnormal; Notable for the following:       Result Value   Glucose, Bld 134 (*)    BUN 33 (*)    Creatinine, Ser 3.95 (*)    Calcium 8.7 (*)    GFR calc non Af Amer 15 (*)    GFR calc Af Amer 17 (*)    All other components within normal limits    EKG  EKG  Interpretation None       Radiology Dg Ankle Complete Right  Result Date: 11/28/2016 CLINICAL DATA:  Pain after fall 3 days ago. EXAM: RIGHT ANKLE - COMPLETE 3+ VIEW COMPARISON:  None. FINDINGS: Lateral soft tissue swelling.  No fracture. IMPRESSION: Lateral soft tissue swelling.  No fracture. Electronically Signed   By: Gerome Sam III M.D   On: 11/28/2016 11:11   Dg Knee Complete 4 Views Right  Result Date: 11/28/2016 CLINICAL DATA:  Pain after fall 3 days ago EXAM: RIGHT KNEE - COMPLETE 4+ VIEW COMPARISON:  November 09, 2016 FINDINGS: Tricompartmental degenerative changes are seen most prominent in medial compartment. There is a small joint effusion. Mild anterior soft tissue swelling. No fractures. IMPRESSION: Small joint effusion. Moderate to severe degenerative changes. No fractures identified. Electronically Signed   By: Gerome Sam III M.D   On: 11/28/2016 11:10   Dg Femur, Min 2 Views Right  Result Date: 11/28/2016 CLINICAL DATA:  Pain after falling 3 days ago. EXAM: RIGHT FEMUR 2 VIEWS COMPARISON:  None. FINDINGS: There is a dystrophic calcification superior to the proximal femur. The visualized femur is normal with no evidence of fracture there is a small effusion in the knee. Limited views of the pelvic bones are normal. IMPRESSION: 1. Small effusion in the knee. 2. No fractures identified. Electronically Signed   By: Gerome Sam III M.D   On: 11/28/2016 11:09    Procedures Procedures (including critical care time)  Medications Ordered in ED Medications  acetaminophen (TYLENOL) tablet 1,000 mg (1,000 mg Oral Given 11/28/16 1708)     Initial Impression / Assessment and Plan / ED Course  I have reviewed the triage vital signs and the nursing notes.  Pertinent labs & imaging results that were available during my care of the patient were reviewed by me and considered in my medical decision making (see chart for details).  Clinical Course as of Nov 29 2303  Wynelle Link  Nov 28, 2016  1825 Page on call for [facility].  [EH]  1827 Dr. Arlean Hopping reports patint can go home.   [EH]  [phone] Patient rechecked, states that he has able to use the crutches to get around safely.  He wishes to go home at this time.  [EH]    Clinical Course User Index [EH] Cristina Gong, PA-C   Aijalon JOSIA LITWILLER presents For evaluation of right knee pain. He reports that he has been unable to walk due to knee pain. This caused like to collapse under him twice today. He did not strike his head, and reports no injuries from this collapse.   No prodromal symptoms. He does report that his leg is swollen. X-rays were obtained, unremarkable for acute abnormalities.  He states that he feels like his leg is swollen and it is questionably so, however patient does have a history of DVTs.  DVT study ordered which was normal.  He was given crutches and able to get around with those in the ER.  Basic labs were obtained, based on his missing dialysis the doctor on-call for dialysis was contacted, states that his potassium it is safe for him to go home and talk with his dialysis center tomorrow to reschedule. Patient was informed of this result, instructed to elevate his legs. The patient's diagnosis was discussed with him, he was given the option to ask questions, all of which were answered to the best of my abilities.  This patient was seen as a shared visit with Dr.Jacubowitz.   Final Clinical Impressions(s) / ED Diagnoses   Final diagnoses:  Right knee pain, unspecified chronicity    New Prescriptions Discharge Medication List as of 11/28/2016  6:33 PM       Cristina Gong, PA-C 11/28/16 2306    Doug Sou, MD 11/29/16 1354

## 2016-12-23 ENCOUNTER — Emergency Department (HOSPITAL_COMMUNITY): Payer: Medicaid Other

## 2016-12-23 ENCOUNTER — Emergency Department (HOSPITAL_COMMUNITY)
Admission: EM | Admit: 2016-12-23 | Discharge: 2016-12-23 | Disposition: A | Discharge status: Home / Self Care | Payer: Medicaid Other | Attending: Emergency Medicine | Admitting: Emergency Medicine

## 2016-12-23 ENCOUNTER — Encounter (HOSPITAL_COMMUNITY): Payer: Self-pay | Admitting: Emergency Medicine

## 2016-12-23 DIAGNOSIS — N184 Chronic kidney disease, stage 4 (severe): Secondary | ICD-10-CM | POA: Diagnosis not present

## 2016-12-23 DIAGNOSIS — Z955 Presence of coronary angioplasty implant and graft: Secondary | ICD-10-CM | POA: Diagnosis not present

## 2016-12-23 DIAGNOSIS — Z87891 Personal history of nicotine dependence: Secondary | ICD-10-CM | POA: Diagnosis not present

## 2016-12-23 DIAGNOSIS — E1122 Type 2 diabetes mellitus with diabetic chronic kidney disease: Secondary | ICD-10-CM | POA: Insufficient documentation

## 2016-12-23 DIAGNOSIS — I10 Essential (primary) hypertension: Secondary | ICD-10-CM

## 2016-12-23 DIAGNOSIS — Z7982 Long term (current) use of aspirin: Secondary | ICD-10-CM | POA: Insufficient documentation

## 2016-12-23 DIAGNOSIS — R519 Headache, unspecified: Secondary | ICD-10-CM

## 2016-12-23 DIAGNOSIS — Z9114 Patient's other noncompliance with medication regimen: Secondary | ICD-10-CM | POA: Diagnosis not present

## 2016-12-23 DIAGNOSIS — R51 Headache: Secondary | ICD-10-CM | POA: Diagnosis present

## 2016-12-23 DIAGNOSIS — I129 Hypertensive chronic kidney disease with stage 1 through stage 4 chronic kidney disease, or unspecified chronic kidney disease: Secondary | ICD-10-CM | POA: Insufficient documentation

## 2016-12-23 DIAGNOSIS — Z79899 Other long term (current) drug therapy: Secondary | ICD-10-CM | POA: Insufficient documentation

## 2016-12-23 DIAGNOSIS — I2510 Atherosclerotic heart disease of native coronary artery without angina pectoris: Secondary | ICD-10-CM | POA: Diagnosis not present

## 2016-12-23 LAB — RAPID URINE DRUG SCREEN, HOSP PERFORMED
Amphetamines: NOT DETECTED
BARBITURATES: NOT DETECTED
BENZODIAZEPINES: NOT DETECTED
Cocaine: NOT DETECTED
Opiates: NOT DETECTED
Tetrahydrocannabinol: NOT DETECTED

## 2016-12-23 LAB — COMPREHENSIVE METABOLIC PANEL
ALBUMIN: 3.4 g/dL — AB (ref 3.5–5.0)
ALT: 14 U/L — AB (ref 17–63)
ANION GAP: 9 (ref 5–15)
AST: 18 U/L (ref 15–41)
Alkaline Phosphatase: 65 U/L (ref 38–126)
BUN: 33 mg/dL — ABNORMAL HIGH (ref 6–20)
CHLORIDE: 103 mmol/L (ref 101–111)
CO2: 24 mmol/L (ref 22–32)
Calcium: 8.4 mg/dL — ABNORMAL LOW (ref 8.9–10.3)
Creatinine, Ser: 3.76 mg/dL — ABNORMAL HIGH (ref 0.61–1.24)
GFR calc non Af Amer: 16 mL/min — ABNORMAL LOW (ref >60)
GFR, EST AFRICAN AMERICAN: 18 mL/min — AB (ref >60)
GLUCOSE: 109 mg/dL — AB (ref 65–99)
Potassium: 3.6 mmol/L (ref 3.5–5.1)
SODIUM: 136 mmol/L (ref 135–145)
Total Bilirubin: 0.5 mg/dL (ref 0.3–1.2)
Total Protein: 6.5 g/dL (ref 6.5–8.1)

## 2016-12-23 LAB — URINALYSIS, ROUTINE W REFLEX MICROSCOPIC
Bacteria, UA: NONE SEEN
Bilirubin Urine: NEGATIVE
GLUCOSE, UA: NEGATIVE mg/dL
KETONES UR: NEGATIVE mg/dL
LEUKOCYTES UA: NEGATIVE
NITRITE: NEGATIVE
PH: 5 (ref 5.0–8.0)
Protein, ur: >=300 mg/dL — AB
SQUAMOUS EPITHELIAL / LPF: NONE SEEN
Specific Gravity, Urine: 1.009 (ref 1.005–1.030)

## 2016-12-23 LAB — CBC WITH DIFFERENTIAL/PLATELET
Basophils Absolute: 0 10*3/uL (ref 0.0–0.1)
Basophils Relative: 1 %
Eosinophils Absolute: 0.2 10*3/uL (ref 0.0–0.7)
Eosinophils Relative: 4 %
HEMATOCRIT: 34.4 % — AB (ref 39.0–52.0)
HEMOGLOBIN: 11.1 g/dL — AB (ref 13.0–17.0)
LYMPHS ABS: 2.7 10*3/uL (ref 0.7–4.0)
Lymphocytes Relative: 45 %
MCH: 28.5 pg (ref 26.0–34.0)
MCHC: 32.3 g/dL (ref 30.0–36.0)
MCV: 88.4 fL (ref 78.0–100.0)
MONOS PCT: 15 %
Monocytes Absolute: 0.9 10*3/uL (ref 0.1–1.0)
NEUTROS ABS: 2.1 10*3/uL (ref 1.7–7.7)
NEUTROS PCT: 35 %
Platelets: 247 10*3/uL (ref 150–400)
RBC: 3.89 MIL/uL — ABNORMAL LOW (ref 4.22–5.81)
RDW: 14.1 % (ref 11.5–15.5)
WBC: 5.9 10*3/uL (ref 4.0–10.5)

## 2016-12-23 LAB — I-STAT CHEM 8, ED
BUN: 31 mg/dL — AB (ref 6–20)
CALCIUM ION: 1.07 mmol/L — AB (ref 1.15–1.40)
CREATININE: 3.9 mg/dL — AB (ref 0.61–1.24)
Chloride: 102 mmol/L (ref 101–111)
GLUCOSE: 109 mg/dL — AB (ref 65–99)
HCT: 35 % — ABNORMAL LOW (ref 39.0–52.0)
Hemoglobin: 11.9 g/dL — ABNORMAL LOW (ref 13.0–17.0)
Potassium: 3.7 mmol/L (ref 3.5–5.1)
Sodium: 140 mmol/L (ref 135–145)
TCO2: 24 mmol/L (ref 22–32)

## 2016-12-23 LAB — I-STAT TROPONIN, ED: Troponin i, poc: 0.02 ng/mL (ref 0.00–0.08)

## 2016-12-23 LAB — CBG MONITORING, ED: GLUCOSE-CAPILLARY: 101 mg/dL — AB (ref 65–99)

## 2016-12-23 MED ORDER — METOPROLOL TARTRATE 25 MG PO TABS
50.0000 mg | ORAL_TABLET | Freq: Once | ORAL | Status: AC
  Administered 2016-12-23: 50 mg via ORAL
  Filled 2016-12-23: qty 2

## 2016-12-23 MED ORDER — OXYCODONE HCL 5 MG PO TABS
5.0000 mg | ORAL_TABLET | Freq: Once | ORAL | Status: AC
  Administered 2016-12-23: 5 mg via ORAL
  Filled 2016-12-23: qty 1

## 2016-12-23 MED ORDER — AMLODIPINE BESYLATE 5 MG PO TABS
10.0000 mg | ORAL_TABLET | Freq: Once | ORAL | Status: AC
  Administered 2016-12-23: 10 mg via ORAL
  Filled 2016-12-23: qty 2

## 2016-12-23 MED ORDER — HYDRALAZINE HCL 20 MG/ML IJ SOLN
10.0000 mg | INTRAMUSCULAR | Status: AC
Start: 2016-12-23 — End: 2016-12-23
  Administered 2016-12-23: 10 mg via INTRAVENOUS
  Filled 2016-12-23: qty 1

## 2016-12-23 MED ORDER — ACETAMINOPHEN 325 MG PO TABS
650.0000 mg | ORAL_TABLET | Freq: Once | ORAL | Status: AC
  Administered 2016-12-23: 650 mg via ORAL
  Filled 2016-12-23: qty 2

## 2016-12-23 NOTE — ED Provider Notes (Signed)
[facility] EMERGENCY DEPARTMENT Provider Note   CSN: [id number] Arrival date & time: 12/23/16  0608     History   Chief Complaint Chief Complaint  Patient presents with  . Hypertension  . Headache    HPI Richard Barnett is a 61 y.o. Richard Barnett.  Richard Barnett is a 61 y.o. Richard Barnett with history of chronic kidney disease stage IV, cognitive delay, hypertension, coronary artery disease who presents to the emergency department complaining of a generalized headache for the past 2 days. Patient also reports he's out of 2 of his medications, one of which is a blood pressure medication for the past 2 days. He is unsure which medications he is out of. He did not bring his medications at the emergency department. He is due to start dialysis at an unknown date. He recently had a fistula placed last month. He tells me he is unsure when his best to go to dialysis for the first time. He reports headache onset 2 days ago that is generalized. He also complains of having "heavy breathing." he reports he feels like this way most of the time and that this is not new. He denies coughing or chest pain. He ambulates with a walker. He reports about 3-4 days ago he slipped and fell going into the bathtub. He reports he did hit his head and did not lose consciousness. He denies fevers, coughing, wheezing, numbness, tingling, weakness, chest pain, double vision or rashes.   The history is provided by the patient and medical records. No language interpreter was used.  Hypertension  Associated symptoms include headaches and shortness of breath. Pertinent negatives include no chest pain and no abdominal pain.  Headache   Associated symptoms include shortness of breath. Pertinent negatives include no fever, no palpitations, no nausea and no vomiting.    Past Medical History:  Diagnosis Date  . Arthritis    "right leg" (08/22/2014)  . CAD (coronary artery disease)    a. CAD s/p 2 stent placement in  [place] 2016. b. Neg nuc 05/2014 & 01/2016.  Marland Kitchen CKD (chronic kidney disease), stage IV (HCC)    Richard Barnett 08/22/2014  . Daily headache   . Depression    "I always get that" (08/22/2014)  . Diabetes mellitus (HCC)   . DVT (deep venous thrombosis) (HCC) ?   RLE  . GERD (gastroesophageal reflux disease)   . Heart murmur   . Hypercholesterolemia   . Hypertension   . Myocardial infarction (HCC)   . Wears glasses     Patient Active Problem List   Diagnosis Date Noted  . Diabetes mellitus (HCC)   . Anemia of chronic renal failure   . Secondary hyperparathyroidism of renal origin (HCC)   . Proteinuria   . Arthritis   . Depression   . Deep vein thrombosis (HCC)   . Myocardial infarction (HCC)   . Heart failure, unspecified (HCC)   . Heart murmur   . Prediabetes 02/22/2015  . Chronic kidney disease, stage IV (severe) (HCC)   . Hyperlipidemia 05/22/2014  . Chest pain 05/21/2014  . Hypokalemia 05/21/2014  . CAD (coronary artery disease) 05/21/2014  . Hypertension 05/21/2014    Past Surgical History:  Procedure Laterality Date  . AV FISTULA PLACEMENT Left 10/01/2016   Procedure: INSERTION OF 4-7MM X 45CM ARTERIOVENOUS (AV) GORE-TEX GRAFT ARM;  Surgeon: Chuck Hint, MD;  Location: [facility] OR;  Service: Vascular;  Laterality: Left;  . CORONARY ANGIOPLASTY WITH STENT PLACEMENT     "  1 + 1"  . DRAINAGE AND CLOSURE OF LYMPHOCELE Left 10/29/2016   Procedure: EVACUATION OF LYMPHOCELE LEFT ARM ARTERIOVENOUS GRAFT;  Surgeon: Chuck Hint, MD;  Location: [facility] OR;  Service: Vascular;  Laterality: Left;       Home Medications    Prior to Admission medications   Medication Sig Start Date End Date Taking? Authorizing Provider  amLODipine (NORVASC) 10 MG tablet TAKE ONE (1) TABLET BY MOUTH EVERY DAY 11/09/16  Yes Liberty Handy, PA-C  aspirin 81 MG EC tablet Take 1 tablet (81 mg total) by mouth daily. Swallow whole. 02/10/16  Yes Massie Maroon, FNP  atorvastatin (LIPITOR) 40 MG  tablet TAKE ONE TABLET BY MOUTH EVERY EVENING AT SIX P.M. 11/09/16  Yes Liberty Handy, PA-C  Brinzolamide-Brimonidine [facility]) 1-0.2 % SUSP Place 1 drop into both eyes 2 (two) times daily.   Yes [provider]  citalopram (CELEXA) 40 MG tablet Take 40 mg by mouth daily.   Yes [provider]  cloNIDine (CATAPRES) 0.2 MG tablet TAKE ONE (1) TABLET BY MOUTH TWO (2) TIMES DAILY 11/09/16  Yes Liberty Handy, PA-C  isosorbide mononitrate (IMDUR) 30 MG 24 hr tablet Take 1 tablet (30 mg total) by mouth daily. 11/09/16  Yes Sharen Heck J, PA-C  latanoprost (XALATAN) 0.005 % ophthalmic solution Place 1 drop into both eyes at bedtime.   Yes [provider]  meloxicam (MOBIC) 15 MG tablet Take 15 mg by mouth daily.   Yes [provider]  metoprolol tartrate (LOPRESSOR) 50 MG tablet TAKE ONE (1) TABLET BY MOUTH TWO (2) TIMES DAILY 11/09/16  Yes Liberty Handy, PA-C  omeprazole (PRILOSEC OTC) 20 MG tablet Take 1 tablet (20 mg total) by mouth daily. 08/11/16 08/11/17 Yes Thomasene Lot, MD  nitroGLYCERIN (NITROSTAT) 0.4 MG SL tablet DISSOLVE 1 TABLET UNDER THE TONGUE AS NEEDED FOR CHEST PAIN. REPEAT AS NEEDED EVERY 5 MINUTES UP TO A TOTAL OF 3 DOSES 12/15/15   Henrietta Hoover, NP  oxyCODONE-acetaminophen (PERCOCET/ROXICET) 5-325 MG tablet Take 1 tablet by mouth every 6 (six) hours as needed. Patient not taking: Reported on 12/23/2016 10/29/16   Lars Mage, PA-C    Family History Family History  Problem Relation Age of Onset  . Hypertension Mother   . Kidney disease Mother   . Heart disease Mother   . Heart disease Father   . Hypertension Father     Social History Social History  Substance Use Topics  . Smoking status: Former Smoker    Packs/day: 0.00    Years: 40.00    Types: Cigarettes    Quit date: 08/29/2010  . Smokeless tobacco: Never Used     Comment: "quit smoking cigarettes in  2011" 10/27/16  . Alcohol use No     Comment: 10/27/16 "stopped  in ~ 2011"     Allergies   Penicillins   Review of Systems Review of Systems  Constitutional: Negative for chills and fever.  HENT: Negative for congestion and sore throat.   Eyes: Negative for pain and visual disturbance.  Respiratory: Positive for shortness of breath. Negative for cough and wheezing.   Cardiovascular: Negative for chest pain and palpitations.  Gastrointestinal: Negative for abdominal pain, diarrhea, nausea and vomiting.  Genitourinary: Negative for difficulty urinating and dysuria.  Musculoskeletal: Negative for back pain and neck pain.  Skin: Negative for rash.  Neurological: Positive for headaches. Negative for dizziness, syncope, weakness, light-headedness and numbness.     Physical Exam Updated  Vital Signs BP (!) 191/89 (BP Location: Right Arm)   Pulse 76   Temp 97.7 F (36.5 C) (Oral)   Resp 13   Ht 5\' 7"  (1.702 m)   SpO2 100%   Physical Exam  Constitutional: He appears well-developed and well-nourished. No distress.  Nontoxic appearing.  HENT:  Head: Normocephalic and atraumatic.  Mouth/Throat: Oropharynx is clear and moist.  Eyes: Pupils are equal, round, and reactive to light. Conjunctivae and EOM are normal. Right eye exhibits no discharge. Left eye exhibits no discharge.  Neck: Normal range of motion. Neck supple. No JVD present.  Cardiovascular: Normal rate, regular rhythm, normal heart sounds and intact distal pulses.  Exam reveals no gallop and no friction rub.   No murmur heard. Pulmonary/Chest: Effort normal and breath sounds normal. No stridor. No respiratory distress. He has no wheezes. He has no rales.  Lungs are clear to ascultation bilaterally. Symmetric chest expansion bilaterally. No increased work of breathing. No rales or rhonchi.    Abdominal: Soft. There is no tenderness. There is no guarding.  Musculoskeletal: He exhibits edema. He exhibits no tenderness or deformity.  Mild bilateral ankle edema without calf edema or TTP.     Lymphadenopathy:    He has no cervical adenopathy.  Neurological: He is alert. No cranial nerve deficit or sensory deficit. He exhibits normal muscle tone. Coordination normal.  Patient is alert and oriented to person and place only. He is confused to month, year and current president. He reports it is June and Halina Andreas is the president. Cranial nerves are intact. Speech is clear and coherent. No facial droop. No pronator drift. Finger to nose is intact bilaterally. Sensation is intact in his bilateral upper and lower extremities. He reports pain with attempting strength testing in his bilateral lower extremities due to "arthritis." He has good strength with plantar and dorsiflexion his bilateral feet.  Skin: Skin is warm and dry. Capillary refill takes less than 2 seconds. No rash noted. He is not diaphoretic. No erythema. No pallor.  Psychiatric: He has a normal mood and affect. His behavior is normal.  Nursing note and vitals reviewed.    ED Treatments / Results  Labs (all labs ordered are listed, but only abnormal results are displayed) Labs Reviewed  COMPREHENSIVE METABOLIC PANEL - Abnormal; Notable for the following:       Result Value   Glucose, Bld 109 (*)    BUN 33 (*)    Creatinine, Ser 3.76 (*)    Calcium 8.4 (*)    Albumin 3.4 (*)    ALT 14 (*)    GFR calc non Af Amer 16 (*)    GFR calc Af Amer 18 (*)    All other components within normal limits  CBC WITH DIFFERENTIAL/PLATELET - Abnormal; Notable for the following:    RBC 3.89 (*)    Hemoglobin 11.1 (*)    HCT 34.4 (*)    All other components within normal limits  URINALYSIS, ROUTINE W REFLEX MICROSCOPIC - Abnormal; Notable for the following:    Color, Urine STRAW (*)    Hgb urine dipstick SMALL (*)    Protein, ur >=300 (*)    All other components within normal limits  I-STAT CHEM 8, ED - Abnormal; Notable for the following:    BUN 31 (*)    Creatinine, Ser 3.90 (*)    Glucose, Bld 109 (*)    Calcium, Ion 1.07  (*)    Hemoglobin 11.9 (*)  HCT 35.0 (*)    All other components within normal limits  CBG MONITORING, ED - Abnormal; Notable for the following:    Glucose-Capillary 101 (*)    All other components within normal limits  RAPID URINE DRUG SCREEN, HOSP PERFORMED  I-STAT TROPONIN, ED    EKG  EKG Interpretation  Date/Time:  Thursday December 23 2016 07:22:45 EDT Ventricular Rate:  67 PR Interval:    QRS Duration: 83 QT Interval:  431 QTC Calculation: 455 R Axis:   19 Text Interpretation:  Sinus rhythm Probable left atrial enlargement Left ventricular hypertrophy Anterior Q waves, possibly due to LVH Nonspecific T abnormalities, lateral leads Confirmed by Margarita Grizzle [phone]) on 12/23/2016 9:28:20 AM       Radiology Dg Chest 2 View  Result Date: 12/23/2016 CLINICAL DATA:  Shortness of breath EXAM: CHEST  2 VIEW COMPARISON:  08/10/2016 FINDINGS: Mildly low lung volumes with borderline heart size and more prominent mediastinal width. Interstitial crowding. There is no [employer], consolidation, effusion, or pneumothorax. IMPRESSION: Low volume chest with borderline heart size. No definite acute disease. Electronically Signed   By: Marnee Spring M.D.   On: 12/23/2016 07:26   Ct Head Wo Contrast  Result Date: 12/23/2016 CLINICAL DATA:  Headache with altered mental status/confusion EXAM: CT HEAD WITHOUT CONTRAST TECHNIQUE: Contiguous axial images were obtained from the base of the skull through the vertex without intravenous contrast. COMPARISON:  October 03, 2015 FINDINGS: Brain: There is mild diffuse atrophy, stable. There is no intracranial mass, hemorrhage, extra-axial fluid collection, or midline shift. There is small vessel disease throughout the centra semiovale bilaterally. Multiple small infarcts are noted throughout the white matter of the centra semiovale bilaterally. There is evidence of a prior infarct involving the posterior left lentiform nucleus, stable. There is a prior  infarct in the head of the caudate nucleus on the right. No acute appearing infarct is evident on this study. Vascular: No hyperdense vessels are evident. There is calcification in each carotid siphon region. Skull: Bony calvarium appears intact. Sinuses/Orbits: There is mucosal thickening in several ethmoid air cells bilaterally. There is mild mucosal thickening in both maxillary antra. Other visualized paranasal sinuses are clear. Orbits appear symmetric bilaterally. Other: There is opacification in several inferior mastoid air cells on the left, chronic. Mastoids elsewhere clear. IMPRESSION: 1. Mild atrophy with extensive periventricular small vessel disease with several small infarcts scattered throughout the centra semiovale bilaterally, stable. Prior infarcts in the head of the caudate nucleus on the right and in the left lentiform nucleus posteriorly. No acute infarct evident. No evident mass, hemorrhage, or extra-axial fluid collection. 2.  There are foci of arterial vascular calcification. 3. Areas of paranasal sinus disease. Chronic inferior left mastoid air cell disease. Electronically Signed   By: Bretta Bang III M.D.   On: 12/23/2016 08:12    Procedures Procedures (including critical care time)  Medications Ordered in ED Medications  amLODipine (NORVASC) tablet 10 mg (10 mg Oral Given 12/23/16 1036)  metoprolol tartrate (LOPRESSOR) tablet 50 mg (50 mg Oral Given 12/23/16 1036)  acetaminophen (TYLENOL) tablet 650 mg (650 mg Oral Given 12/23/16 1123)  hydrALAZINE (APRESOLINE) injection 10 mg (10 mg Intravenous Given 12/23/16 1423)  oxyCODONE (Oxy IR/ROXICODONE) immediate release tablet 5 mg (5 mg Oral Given 12/23/16 1423)     Initial Impression / Assessment and Plan / ED Course  I have reviewed the triage vital signs and the nursing notes.  Pertinent labs & imaging results that were available during my  care of the patient were reviewed by me and considered in my medical decision  making (see chart for details).    This  is a 61 y.o. Reinhold with history of chronic kidney disease stage IV, cognitive delay, hypertension, coronary artery disease who presents to the emergency department complaining of a generalized headache for the past 2 days. Patient also reports he's out of 2 of his medications, one of which is a blood pressure medication for the past 2 days. He is unsure which medications he is out of. He did not bring his medications at the emergency department. He is due to start dialysis at an unknown date. He recently had a fistula placed last month. He tells me he is unsure when his best to go to dialysis for the first time. He reports headache onset 2 days ago that is generalized. He also complains of having "heavy breathing." he reports he feels like this way most of the time and that this is not new. He denies coughing or chest pain. He ambulates with a walker. He reports about 3-4 days ago he slipped and fell going into the bathtub. He reports he did hit his head and did not lose consciousness.  On exam the patient is afebrile and nontoxic appearing. He does have some slight confusion about the month and year. He otherwise has no focal neurological deficits. I spoke with his former health aide to tells me that he chronically has some dementia and confusion. She reports he often gets upset in the hospital or with his doctors due to having trouble with his memory. This is not new for him according to her. He reports there is another a that has been helping him well she has been out for some surgery. Troponin here is unremarkable. CMP shows a creatinine that is around his baseline at 3.36. No oral sodium and potassium. Many for emergent dialysis at this time. CBC without leukocytosis. CT scan of his head shows some old appearing small infarcts but no evidence of acute infarct. No evidence of mass, hemorrhage or extra-axial fluid collection. Patient is not taking his blood pressure  medicines today. He received his daily medications and he still complains of a headache. He is provided with Tylenol and some Lopressor to help with headache and his blood pressure. I did consul to care management who found out the patient is not yet due for dialysis and they're working on getting him set up with a chair in the short-term. He has a home health aid, but no one to give him home medications. Will set up nurse to come to his house to help with his medications so he is more compliant. I suspect his HTN was a large component of his headache today. He reports feeling much better reevaluation. I discussed findings and about having home health nurse come to his house. He is very agreeable and happy about this. He also has a home health aide every day. Will continue this as well. We'll discharge patient and encourage close follow-up by primary care and discuss return precautions. I advised the patient to follow-up with their primary care provider this week. I advised the patient to return to the emergency department with new or worsening symptoms or new concerns. The patient verbalized understanding and agreement with plan.   This patient was discussed with and evaluated by Dr. Rosalia Hammersay who agrees with assessment and plan.   Final Clinical Impressions(s) / ED Diagnoses   Final diagnoses:  Essential hypertension  Bad headache  Non compliance w medication regimen    New Prescriptions Discharge Medication List as of 12/23/2016  2:58 PM       Everlene Farrier, PA-C 12/23/16 1656    Margarita Grizzle, MD 12/24/16 [phone]

## 2016-12-23 NOTE — ED Notes (Signed)
ED Provider at bedside. 

## 2016-12-23 NOTE — ED Notes (Signed)
Pt verbalized understanding of d/c instructions and has no further questions. Pt given cab voucher, sandwich bag, apple juice and wheeled out the entrance to wait for [employer]. Cab was called by this RN. VSS, NAD. Pt to be followed by home health.

## 2016-12-23 NOTE — ED Triage Notes (Signed)
Pt presents from home with [facility] for headache that woke him from sleep this morning but began yesterday; also high BP per EMS with manual pressure as 160/90 and automatic reading of 192/108; pt denies any other focal deficits, denies n/v, denies vision changes; pt oriented to self and location but not time (does not know day, month, year but knows halloween is the next holiday); AV fistula noted to L upper arm (cannot recall HD schedule)

## 2016-12-23 NOTE — ED Notes (Signed)
Returns from CT 

## 2016-12-23 NOTE — Discharge Instructions (Signed)
Please follow up with your primary care doctor to have your blood pressure rechecked. Please take your aspirin daily.

## 2016-12-23 NOTE — ED Notes (Signed)
This RN helped the pt to stand to use urinal without difficulty.

## 2016-12-23 NOTE — ED Notes (Signed)
Requested urine sample, states unable to at this time. Will retry shortly.

## 2017-01-08 ENCOUNTER — Encounter (HOSPITAL_COMMUNITY): Payer: Self-pay | Admitting: Emergency Medicine

## 2017-01-08 ENCOUNTER — Emergency Department (HOSPITAL_COMMUNITY)
Admission: EM | Admit: 2017-01-08 | Discharge: 2017-01-08 | Disposition: A | Discharge status: Home / Self Care | Payer: Medicaid Other | Attending: Emergency Medicine | Admitting: Emergency Medicine

## 2017-01-08 DIAGNOSIS — Z955 Presence of coronary angioplasty implant and graft: Secondary | ICD-10-CM | POA: Insufficient documentation

## 2017-01-08 DIAGNOSIS — R519 Headache, unspecified: Secondary | ICD-10-CM

## 2017-01-08 DIAGNOSIS — Z79899 Other long term (current) drug therapy: Secondary | ICD-10-CM | POA: Insufficient documentation

## 2017-01-08 DIAGNOSIS — I252 Old myocardial infarction: Secondary | ICD-10-CM | POA: Insufficient documentation

## 2017-01-08 DIAGNOSIS — R51 Headache: Secondary | ICD-10-CM | POA: Insufficient documentation

## 2017-01-08 DIAGNOSIS — I10 Essential (primary) hypertension: Secondary | ICD-10-CM

## 2017-01-08 DIAGNOSIS — Z87891 Personal history of nicotine dependence: Secondary | ICD-10-CM | POA: Diagnosis not present

## 2017-01-08 DIAGNOSIS — I120 Hypertensive chronic kidney disease with stage 5 chronic kidney disease or end stage renal disease: Secondary | ICD-10-CM | POA: Insufficient documentation

## 2017-01-08 DIAGNOSIS — Z7982 Long term (current) use of aspirin: Secondary | ICD-10-CM | POA: Insufficient documentation

## 2017-01-08 DIAGNOSIS — I2510 Atherosclerotic heart disease of native coronary artery without angina pectoris: Secondary | ICD-10-CM | POA: Insufficient documentation

## 2017-01-08 DIAGNOSIS — N185 Chronic kidney disease, stage 5: Secondary | ICD-10-CM | POA: Insufficient documentation

## 2017-01-08 MED ORDER — DEXAMETHASONE SODIUM PHOSPHATE 10 MG/ML IJ SOLN
10.0000 mg | Freq: Once | INTRAMUSCULAR | Status: AC
  Administered 2017-01-08: 10 mg via INTRAVENOUS
  Filled 2017-01-08: qty 1

## 2017-01-08 MED ORDER — METOPROLOL TARTRATE 50 MG PO TABS: ORAL_TABLET | ORAL | 0 refills | Status: DC

## 2017-01-08 MED ORDER — CLONIDINE HCL 0.2 MG PO TABS: ORAL_TABLET | ORAL | 0 refills | Status: DC

## 2017-01-08 MED ORDER — KETOROLAC TROMETHAMINE 30 MG/ML IJ SOLN
30.0000 mg | Freq: Once | INTRAMUSCULAR | Status: AC
  Administered 2017-01-08: 30 mg via INTRAVENOUS
  Filled 2017-01-08: qty 1

## 2017-01-08 MED ORDER — AMLODIPINE BESYLATE 10 MG PO TABS: ORAL_TABLET | ORAL | 0 refills | Status: DC

## 2017-01-08 MED ORDER — DIPHENHYDRAMINE HCL 50 MG/ML IJ SOLN
25.0000 mg | Freq: Once | INTRAMUSCULAR | Status: AC
  Administered 2017-01-08: 25 mg via INTRAVENOUS
  Filled 2017-01-08: qty 1

## 2017-01-08 MED ORDER — METOCLOPRAMIDE HCL 5 MG/ML IJ SOLN
10.0000 mg | Freq: Once | INTRAMUSCULAR | Status: AC
  Administered 2017-01-08: 10 mg via INTRAVENOUS
  Filled 2017-01-08: qty 2

## 2017-01-08 MED ORDER — SODIUM CHLORIDE 0.9 % IV BOLUS (SEPSIS)
1000.0000 mL | Freq: Once | INTRAVENOUS | Status: AC
  Administered 2017-01-08: 1000 mL via INTRAVENOUS

## 2017-01-08 NOTE — ED Notes (Signed)
Pt denies any recent sickness Denies N/V/D

## 2017-01-08 NOTE — ED Provider Notes (Signed)
[facility] Provider Note   CSN: [id number] Arrival date & time: 01/08/17  1029     History   Chief Complaint Chief Complaint  Patient presents with  . Headache  . Hypertension    HPI Desman Scheryl DarterG Symanski is a 61 y.o. [place].  HPI   61 year old Jessica with history of daily headache, depression, diabetes, hypertension, CAD presented complaining of headache.  Patient report he was awoke at 3 AM this morning with headache.  He described headache as a tightness pressure sensation throughout his head that caused his teeth and his body hurt.  Headache is moderate to severe.  He tries to walk around his house but it provided no relief and therefore he contacted EMS and patient was brought here for further evaluation.  Headache is similar to prior headaches except more intense.  He attributed his headache due to not having his blood pressure medication.  States that he had had his medication for the past 3-4 weeks because his doctor would not refill it.  He did not give me a reason why.  He denies any associated fever, chills, diplopia, loss of vision, URI symptoms, neck pain, neck stiffness, chest pain, shortness of breath, focal numbness or weakness, or rash.  Past Medical History:  Diagnosis Date  . Arthritis    "right leg" (08/22/2014)  . CAD (coronary artery disease)    a. CAD s/p 2 stent placement in [place] 2016. b. Neg nuc 05/2014 & 01/2016.  Marland Kitchen. CKD (chronic kidney disease), stage IV (HCC)    [name]/notes 08/22/2014  . Daily headache   . Depression    "I always get that" (08/22/2014)  . Diabetes mellitus (HCC)   . DVT (deep venous thrombosis) (HCC) ?   RLE  . GERD (gastroesophageal reflux disease)   . Heart murmur   . Hypercholesterolemia   . Hypertension   . Myocardial infarction (HCC)   . Wears glasses     Patient Active Problem List   Diagnosis Date Noted  . Diabetes mellitus (HCC)   . Anemia of chronic renal failure   . Secondary  hyperparathyroidism of renal origin (HCC)   . Proteinuria   . Arthritis   . Depression   . Deep vein thrombosis (HCC)   . Myocardial infarction (HCC)   . Heart failure, unspecified (HCC)   . Heart murmur   . Prediabetes 02/22/2015  . Chronic kidney disease, stage IV (severe) (HCC)   . Hyperlipidemia 05/22/2014  . Chest pain 05/21/2014  . Hypokalemia 05/21/2014  . CAD (coronary artery disease) 05/21/2014  . Hypertension 05/21/2014    Past Surgical History:  Procedure Laterality Date  . AV FISTULA PLACEMENT Left 10/01/2016   Procedure: INSERTION OF 4-7MM X 45CM ARTERIOVENOUS (AV) GORE-TEX GRAFT ARM;  Surgeon: Chuck Hintickson, Christopher S, MD;  [facility];  Service: Vascular;  Laterality: Left;  . CORONARY ANGIOPLASTY WITH STENT PLACEMENT     "1 + 1"  . DRAINAGE AND CLOSURE OF LYMPHOCELE Left 10/29/2016   Procedure: EVACUATION OF LYMPHOCELE LEFT ARM ARTERIOVENOUS GRAFT;  Surgeon: Chuck Hintickson, Christopher S, MD;  [facility];  Service: Vascular;  Laterality: Left;       Home Medications    Prior to Admission medications   Medication Sig Start Date End Date Taking? Authorizing Provider  amLODipine (NORVASC) 10 MG tablet TAKE ONE (1) TABLET BY MOUTH EVERY DAY 11/09/16   Liberty HandyGibbons, Claudia J, PA-C  aspirin 81 MG EC tablet Take 1 tablet (81 mg total) by  mouth daily. Swallow whole. 02/10/16   Massie Maroon, FNP  atorvastatin (LIPITOR) 40 MG tablet TAKE ONE TABLET BY MOUTH EVERY EVENING AT SIX P.M. 11/09/16   Liberty Handy, PA-C  Brinzolamide-Brimonidine [facility]) 1-0.2 % SUSP Place 1 drop into both eyes 2 (two) times daily.    [provider]  citalopram (CELEXA) 40 MG tablet Take 40 mg by mouth daily.    [provider]  cloNIDine (CATAPRES) 0.2 MG tablet TAKE ONE (1) TABLET BY MOUTH TWO (2) TIMES DAILY 11/09/16   Liberty Handy, PA-C  isosorbide mononitrate (IMDUR) 30 MG 24 hr tablet Take 1 tablet (30 mg total) by mouth daily. 11/09/16   Liberty Handy, PA-C    latanoprost (XALATAN) 0.005 % ophthalmic solution Place 1 drop into both eyes at bedtime.    [provider]  meloxicam (MOBIC) 15 MG tablet Take 15 mg by mouth daily.    [provider]  metoprolol tartrate (LOPRESSOR) 50 MG tablet TAKE ONE (1) TABLET BY MOUTH TWO (2) TIMES DAILY 11/09/16   Liberty Handy, PA-C  nitroGLYCERIN (NITROSTAT) 0.4 MG SL tablet DISSOLVE 1 TABLET UNDER THE TONGUE AS NEEDED FOR CHEST PAIN. REPEAT AS NEEDED EVERY 5 MINUTES UP TO A TOTAL OF 3 DOSES 12/15/15   Henrietta Hoover, NP  omeprazole (PRILOSEC OTC) 20 MG tablet Take 1 tablet (20 mg total) by mouth daily. 08/11/16 08/11/17  Thomasene Lot, MD  oxyCODONE-acetaminophen (PERCOCET/ROXICET) 5-325 MG tablet Take 1 tablet by mouth every 6 (six) hours as needed. Patient not taking: Reported on 12/23/2016 10/29/16   Lars Mage, PA-C    Family History Family History  Problem Relation Age of Onset  . Hypertension Mother   . Kidney disease Mother   . Heart disease Mother   . Heart disease Father   . Hypertension Father     Social History Social History  Substance Use Topics  . Smoking status: Former Smoker    Packs/day: 0.00    Years: 40.00    Types: Cigarettes    Quit date: 08/29/2010  . Smokeless tobacco: Never Used     Comment: "quit smoking cigarettes in  2011" 10/27/16  . Alcohol use No     Comment: 10/27/16 "stopped in ~ 2011"     Allergies   Penicillins   Review of Systems Review of Systems  All other systems reviewed and are negative.    Physical Exam Updated Vital Signs BP (!) 152/93 (BP Location: Right Arm)   Pulse 60   Temp (!) 97.4 F (36.3 C) (Oral)   Resp 13   Ht 5\' 7"  (1.702 m)   SpO2 99%   Physical Exam  Constitutional: He appears well-developed and well-nourished. No distress.  Resting comfortably, in no acute discomfort. Non toxic in appearance.     HENT:  Head: Atraumatic.  Eyes: Pupils are equal, round, and reactive to light. Conjunctivae and EOM are  normal.  Neck: Normal range of motion. Neck supple.  No nuchal rigidity  Cardiovascular: Normal rate and regular rhythm.   Pulmonary/Chest: Effort normal and breath sounds normal.  Abdominal: Soft. He exhibits no distension. There is no tenderness.  Neurological: He is alert. He has normal strength. No cranial nerve deficit or sensory deficit. He displays a negative Romberg sign. GCS eye subscore is 4. GCS verbal subscore is 5. GCS motor subscore is 6.  Pt is alert to self and situation, not to place or time.  5/5 strength to all 4 extremities  Skin: No rash noted.  Psychiatric: He has a normal mood and affect.  Nursing note and vitals reviewed.    ED Treatments / Results  Labs (all labs ordered are listed, but only abnormal results are displayed) Labs Reviewed - No data to display  EKG  EKG Interpretation None       Radiology No results found.  Procedures Procedures (including critical care time)  Medications Ordered in ED Medications - No data to display   Initial Impression / Assessment and Plan / ED Course  I have reviewed the triage vital signs and the nursing notes.  Pertinent labs & imaging results that were available during my care of the patient were reviewed by me and considered in my medical decision making (see chart for details).     BP (!) 191/108   Pulse 64   Temp (!) 97.4 F (36.3 C) (Oral)   Resp 12   Ht 5\' 7"  (1.702 m)   SpO2 98%  The patient was noted to be hypertensive today in the emergency department. I have spoken with the patient regarding hypertension and the need for improved management. I instructed the patient to followup with the Primary care doctor within 4 days to improve the management of the patient's hypertension. I also counseled the patient regarding the signs and symptoms which would require an emergent visit to an emergency department for hypertensive urgency and/or hypertensive emergency. The patient understood the need for  improved hypertensive management.   Final Clinical Impressions(s) / ED Diagnoses   Final diagnoses:  Bad headache  Essential hypertension    New Prescriptions Current Discharge Medication List     Headache similar to previous, no fever, neck stiffness, neuro findings or new symptoms to suggest more serious etiology.  I don't think SAH, ICH, meningitis, encephalitis, mass at this time.  No recent trauma.  I don't feel imaging necessary at this time.  Plan to control symptoms.  2:59 PM Pt felt much better after migraine cocktail and felt comfortable going home.  Will refill his BP medications.  Recommend f/u with PCP for further care.     Fayrene Helper, PA-C 01/08/17 1504    Rolland Porter, MD 01/17/17 [phone]

## 2017-01-08 NOTE — ED Triage Notes (Signed)
Per PTAR:  Pt from home with c/o hypertension, headache, and a general unwell feeling since 3 am this morning.  Pt states he has been out of his BP meds though PTAR states seeing a full prescription of side table.  Pt recently had an AV fistula placed in July 2018.  PTA vitals:  BP 196/120, HR 66, SP02 97% on RA.

## 2017-01-13 ENCOUNTER — Emergency Department (HOSPITAL_BASED_OUTPATIENT_CLINIC_OR_DEPARTMENT_OTHER): Payer: Medicaid Other

## 2017-01-13 ENCOUNTER — Other Ambulatory Visit: Payer: Self-pay

## 2017-01-13 ENCOUNTER — Inpatient Hospital Stay (HOSPITAL_COMMUNITY)
Admission: EM | Admit: 2017-01-13 | Discharge: 2017-01-15 | DRG: 287 | Disposition: A | Discharge status: Home / Self Care | Payer: Medicaid Other | Attending: Internal Medicine | Admitting: Internal Medicine

## 2017-01-13 ENCOUNTER — Encounter (HOSPITAL_COMMUNITY): Payer: Self-pay

## 2017-01-13 ENCOUNTER — Emergency Department (HOSPITAL_COMMUNITY): Payer: Medicaid Other

## 2017-01-13 DIAGNOSIS — I340 Nonrheumatic mitral (valve) insufficiency: Secondary | ICD-10-CM

## 2017-01-13 DIAGNOSIS — F329 Major depressive disorder, single episode, unspecified: Secondary | ICD-10-CM | POA: Diagnosis present

## 2017-01-13 DIAGNOSIS — Z7982 Long term (current) use of aspirin: Secondary | ICD-10-CM

## 2017-01-13 DIAGNOSIS — R7989 Other specified abnormal findings of blood chemistry: Secondary | ICD-10-CM | POA: Diagnosis not present

## 2017-01-13 DIAGNOSIS — I252 Old myocardial infarction: Secondary | ICD-10-CM

## 2017-01-13 DIAGNOSIS — R0789 Other chest pain: Secondary | ICD-10-CM

## 2017-01-13 DIAGNOSIS — N185 Chronic kidney disease, stage 5: Secondary | ICD-10-CM | POA: Diagnosis present

## 2017-01-13 DIAGNOSIS — I248 Other forms of acute ischemic heart disease: Secondary | ICD-10-CM

## 2017-01-13 DIAGNOSIS — Z955 Presence of coronary angioplasty implant and graft: Secondary | ICD-10-CM

## 2017-01-13 DIAGNOSIS — E785 Hyperlipidemia, unspecified: Secondary | ICD-10-CM | POA: Diagnosis present

## 2017-01-13 DIAGNOSIS — E1122 Type 2 diabetes mellitus with diabetic chronic kidney disease: Secondary | ICD-10-CM | POA: Diagnosis present

## 2017-01-13 DIAGNOSIS — Z833 Family history of diabetes mellitus: Secondary | ICD-10-CM

## 2017-01-13 DIAGNOSIS — Z8249 Family history of ischemic heart disease and other diseases of the circulatory system: Secondary | ICD-10-CM

## 2017-01-13 DIAGNOSIS — R079 Chest pain, unspecified: Secondary | ICD-10-CM | POA: Diagnosis not present

## 2017-01-13 DIAGNOSIS — R748 Abnormal levels of other serum enzymes: Secondary | ICD-10-CM | POA: Diagnosis not present

## 2017-01-13 DIAGNOSIS — I214 Non-ST elevation (NSTEMI) myocardial infarction: Secondary | ICD-10-CM

## 2017-01-13 DIAGNOSIS — I25110 Atherosclerotic heart disease of native coronary artery with unstable angina pectoris: Principal | ICD-10-CM | POA: Diagnosis present

## 2017-01-13 DIAGNOSIS — K219 Gastro-esophageal reflux disease without esophagitis: Secondary | ICD-10-CM | POA: Diagnosis present

## 2017-01-13 DIAGNOSIS — Z880 Allergy status to penicillin: Secondary | ICD-10-CM | POA: Diagnosis not present

## 2017-01-13 DIAGNOSIS — I160 Hypertensive urgency: Secondary | ICD-10-CM | POA: Diagnosis present

## 2017-01-13 DIAGNOSIS — I2510 Atherosclerotic heart disease of native coronary artery without angina pectoris: Secondary | ICD-10-CM

## 2017-01-13 DIAGNOSIS — Z87891 Personal history of nicotine dependence: Secondary | ICD-10-CM | POA: Diagnosis not present

## 2017-01-13 DIAGNOSIS — I120 Hypertensive chronic kidney disease with stage 5 chronic kidney disease or end stage renal disease: Secondary | ICD-10-CM | POA: Diagnosis present

## 2017-01-13 DIAGNOSIS — Z79899 Other long term (current) drug therapy: Secondary | ICD-10-CM

## 2017-01-13 DIAGNOSIS — N184 Chronic kidney disease, stage 4 (severe): Secondary | ICD-10-CM | POA: Diagnosis not present

## 2017-01-13 DIAGNOSIS — I129 Hypertensive chronic kidney disease with stage 1 through stage 4 chronic kidney disease, or unspecified chronic kidney disease: Secondary | ICD-10-CM | POA: Diagnosis not present

## 2017-01-13 LAB — ECHOCARDIOGRAM COMPLETE
AVLVOTPG: 6 mmHg
CHL CUP DOP CALC LVOT VTI: 26.2 cm
CHL CUP MV DEC (S): 338
CHL CUP RV SYS PRESS: 40 mmHg
CHL CUP TV REG PEAK VELOCITY: 303 cm/s
E decel time: 338 msec
FS: 34 % (ref 28–44)
Height: 67 in
IV/PV OW: 1
LA ID, A-P, ES: 38 mm
LADIAMINDEX: 1.77 cm/m2
LAVOL: 68.3 mL
LAVOLA4C: 57.7 mL
LAVOLIN: 31.8 mL/m2
LEFT ATRIUM END SYS DIAM: 38 mm
LVELAT: 5.87 cm/s
LVOT area: 2.54 cm2
LVOT diameter: 18 mm
LVOT peak vel: 127 cm/s
LVOTSV: 67 mL
MVPKEVEL: 1.1 m/s
PW: 12 mm — AB (ref 0.6–1.1)
RV LATERAL S' VELOCITY: 10.8 cm/s
TAPSE: 24.8 mm
TDI e' lateral: 5.87
TDI e' medial: 5.33
TRMAXVEL: 303 cm/s
Weight: 3680 oz

## 2017-01-13 LAB — BASIC METABOLIC PANEL
ANION GAP: 8 (ref 5–15)
BUN: 35 mg/dL — ABNORMAL HIGH (ref 6–20)
CALCIUM: 8.2 mg/dL — AB (ref 8.9–10.3)
CO2: 22 mmol/L (ref 22–32)
CREATININE: 3.41 mg/dL — AB (ref 0.61–1.24)
Chloride: 106 mmol/L (ref 101–111)
GFR, EST AFRICAN AMERICAN: 21 mL/min — AB (ref >60)
GFR, EST NON AFRICAN AMERICAN: 18 mL/min — AB (ref >60)
GLUCOSE: 119 mg/dL — AB (ref 65–99)
Potassium: 3.6 mmol/L (ref 3.5–5.1)
Sodium: 136 mmol/L (ref 135–145)

## 2017-01-13 LAB — RAPID URINE DRUG SCREEN, HOSP PERFORMED
Amphetamines: NOT DETECTED
BARBITURATES: NOT DETECTED
Benzodiazepines: NOT DETECTED
COCAINE: NOT DETECTED
Opiates: NOT DETECTED
TETRAHYDROCANNABINOL: NOT DETECTED

## 2017-01-13 LAB — CBC
HCT: 32.8 % — ABNORMAL LOW (ref 39.0–52.0)
HEMOGLOBIN: 10.8 g/dL — AB (ref 13.0–17.0)
MCH: 28.9 pg (ref 26.0–34.0)
MCHC: 32.9 g/dL (ref 30.0–36.0)
MCV: 87.7 fL (ref 78.0–100.0)
PLATELETS: 243 10*3/uL (ref 150–400)
RBC: 3.74 MIL/uL — ABNORMAL LOW (ref 4.22–5.81)
RDW: 14 % (ref 11.5–15.5)
WBC: 4.6 10*3/uL (ref 4.0–10.5)

## 2017-01-13 LAB — I-STAT TROPONIN, ED
TROPONIN I, POC: 0.03 ng/mL (ref 0.00–0.08)
Troponin i, poc: 0.1 ng/mL (ref 0.00–0.08)

## 2017-01-13 LAB — HEPARIN LEVEL (UNFRACTIONATED): Heparin Unfractionated: 0.44 IU/mL (ref 0.30–0.70)

## 2017-01-13 LAB — TROPONIN I: Troponin I: 0.18 ng/mL (ref <0.03)

## 2017-01-13 LAB — GLUCOSE, CAPILLARY: Glucose-Capillary: 104 mg/dL — ABNORMAL HIGH (ref 65–99)

## 2017-01-13 MED ORDER — ACETAMINOPHEN 650 MG RE SUPP
650.0000 mg | Freq: Four times a day (QID) | RECTAL | Status: DC | PRN
Start: 2017-01-13 — End: 2017-01-15

## 2017-01-13 MED ORDER — MORPHINE SULFATE (PF) 2 MG/ML IV SOLN: 2.0000 mg | Freq: Once | INTRAVENOUS | Status: DC

## 2017-01-13 MED ORDER — ATORVASTATIN CALCIUM 40 MG PO TABS
40.0000 mg | ORAL_TABLET | Freq: Every day | ORAL | Status: DC
  Administered 2017-01-13 – 2017-01-14 (×2): 40 mg via ORAL
  Filled 2017-01-13 (×2): qty 1

## 2017-01-13 MED ORDER — PROMETHAZINE HCL 25 MG PO TABS: 12.5000 mg | ORAL_TABLET | Freq: Four times a day (QID) | ORAL | Status: DC | PRN

## 2017-01-13 MED ORDER — SODIUM CHLORIDE 0.9 % IV BOLUS (SEPSIS)
1000.0000 mL | Freq: Once | INTRAVENOUS | Status: AC
  Administered 2017-01-13: 1000 mL via INTRAVENOUS

## 2017-01-13 MED ORDER — ACETAMINOPHEN 325 MG PO TABS
650.0000 mg | ORAL_TABLET | Freq: Four times a day (QID) | ORAL | Status: DC | PRN
  Administered 2017-01-13: 650 mg via ORAL
  Filled 2017-01-13: qty 2

## 2017-01-13 MED ORDER — HEPARIN BOLUS VIA INFUSION
4000.0000 [IU] | Freq: Once | INTRAVENOUS | Status: AC
  Administered 2017-01-13: 4000 [IU] via INTRAVENOUS
  Filled 2017-01-13: qty 4000

## 2017-01-13 MED ORDER — OMEPRAZOLE MAGNESIUM 20 MG PO TBEC: 20.0000 mg | DELAYED_RELEASE_TABLET | Freq: Every day | ORAL | Status: DC

## 2017-01-13 MED ORDER — PANTOPRAZOLE SODIUM 20 MG PO TBEC
20.0000 mg | DELAYED_RELEASE_TABLET | Freq: Every day | ORAL | Status: DC
  Administered 2017-01-13 – 2017-01-15 (×3): 20 mg via ORAL
  Filled 2017-01-13 (×3): qty 1

## 2017-01-13 MED ORDER — NITROGLYCERIN 0.4 MG SL SUBL
0.4000 mg | SUBLINGUAL_TABLET | SUBLINGUAL | Status: DC | PRN
  Administered 2017-01-13 – 2017-01-14 (×2): 0.4 mg via SUBLINGUAL
  Filled 2017-01-13 (×2): qty 1

## 2017-01-13 MED ORDER — CLONIDINE HCL 0.2 MG PO TABS
0.2000 mg | ORAL_TABLET | Freq: Two times a day (BID) | ORAL | Status: DC
  Administered 2017-01-13 – 2017-01-15 (×4): 0.2 mg via ORAL
  Filled 2017-01-13 (×4): qty 1

## 2017-01-13 MED ORDER — ISOSORBIDE MONONITRATE ER 30 MG PO TB24
30.0000 mg | ORAL_TABLET | Freq: Every day | ORAL | Status: DC
  Administered 2017-01-13 – 2017-01-14 (×2): 30 mg via ORAL
  Filled 2017-01-13 (×2): qty 1

## 2017-01-13 MED ORDER — HEPARIN (PORCINE) IN NACL 100-0.45 UNIT/ML-% IJ SOLN
1250.0000 [IU]/h | INTRAMUSCULAR | Status: DC
  Administered 2017-01-13 – 2017-01-14 (×2): 1250 [IU]/h via INTRAVENOUS
  Filled 2017-01-13 (×2): qty 250

## 2017-01-13 MED ORDER — CLONIDINE HCL 0.2 MG PO TABS: 0.2000 mg | ORAL_TABLET | Freq: Two times a day (BID) | ORAL | Status: DC

## 2017-01-13 MED ORDER — SODIUM CHLORIDE 0.9% FLUSH
3.0000 mL | Freq: Two times a day (BID) | INTRAVENOUS | Status: DC
  Administered 2017-01-14 (×2): 3 mL via INTRAVENOUS

## 2017-01-13 NOTE — ED Notes (Signed)
Pt up to the bathroom. Pt refused use of urinal on bed railing.

## 2017-01-13 NOTE — Progress Notes (Signed)
  Echocardiogram 2D Echocardiogram has been performed.  Richard Barnett 01/13/2017, 4:49 PM

## 2017-01-13 NOTE — H&P (Signed)
Date: 01/13/2017               Patient Name:  Richard Barnett MRN: [id number]  DOB: [birth year] Age / Sex: 61 y.o., Richard Barnett   PCP: Patient, No Pcp Per         Medical Service: Internal Medicine Teaching Service         Attending Physician: Dr. Earl Lagos, MD    First Contact: Dr. Johnny Bridge Pager: [phone]  Second Contact: Dr. Delma Officer Pager: [phone]       After Hours (After 5p/  First Contact Pager: [phone]  weekends / holidays): Second Contact Pager: [phone]   Chief Complaint: Chest pain  History of Present Illness: Richard Barnett is a 61 year old Richard Barnett with past medical history of diabetes mellitus, hypertension, coronary artery disease status post 2 stent placements in 2016, CKD stage IV, and hyperlipidemia who presents with chest pain.  The patient states that this morning he woke up and after getting ready he noticed that his left arm felt weak and numb.  A short duration afterward he felt that his face and chest hurt and everything felt funny.  The patient states that the chest pain is on the left anterior portion of his chest and radiates down his left arm.  The chest pain is/10 intensity, sharp in nature, radiating down left arm, and lasted a few hours.  Chest pain is accompanied by shortness of breath, dizziness, weakness, and headache 8/10 intensity.  He also states that his mouth started swelling he had abdominal pain localized to the umbilical region. There is no the patient states that he did not have any diaphoresis, nausea, vomiting.  Richard Barnett states that the chest pain is similar to the pain he has had in the past when he had stent placement.  Patient states that he was concerned about the symptoms and therefore called EMS.  While he was waiting for EMS to arrive he checked his blood pressure and it was 209/159.  Patient is on several cardiac and antihypertensive medications which he states he ran out of 3 days ago.  The patient states that he has several doses of medications prior to  3 days ago due to being able to refill his medications.  ED course: In the wounds he received 481 mg aspirins and nitroglycerin.  The patient stated that his chest pain improved remarkably after giving nitroglycerin.  In the ED the patient did not have any chest pain.  Cardiology was consulted.  EKG not show any acute changes.  Meds:  Current Meds  Medication Sig  . amLODipine (NORVASC) 10 MG tablet TAKE ONE (1) TABLET BY MOUTH EVERY DAY  . aspirin 81 MG EC tablet Take 1 tablet (81 mg total) by mouth daily. Swallow whole.  Marland Kitchen atorvastatin (LIPITOR) 40 MG tablet TAKE ONE TABLET BY MOUTH EVERY EVENING AT SIX P.M.  . Brinzolamide-Brimonidine (SIMBRINZA) 1-0.2 % SUSP Place 1 drop into both eyes 2 (two) times daily.  . citalopram (CELEXA) 40 MG tablet Take 40 mg by mouth daily.  . cloNIDine (CATAPRES) 0.2 MG tablet TAKE ONE (1) TABLET BY MOUTH TWO (2) TIMES DAILY  . isosorbide mononitrate (IMDUR) 30 MG 24 hr tablet Take 1 tablet (30 mg total) by mouth daily.  Marland Kitchen latanoprost (XALATAN) 0.005 % ophthalmic solution Place 1 drop into both eyes at bedtime.  . meloxicam (MOBIC) 15 MG tablet Take 15 mg by mouth daily.  . metoprolol tartrate (LOPRESSOR) 50 MG tablet TAKE ONE (1) TABLET BY MOUTH TWO (  2) TIMES DAILY  . omeprazole (PRILOSEC OTC) 20 MG tablet Take 1 tablet (20 mg total) by mouth daily.  Marland Kitchen. oxyCODONE-acetaminophen (PERCOCET/ROXICET) 5-325 MG tablet Take 1 tablet by mouth every 6 (six) hours as needed.     Allergies: Allergies as of 01/13/2017 - Review Complete 01/13/2017  Allergen Reaction Noted  . Penicillins Swelling and Rash 05/21/2014   Past Medical History:  Diagnosis Date  . Arthritis    "right leg" (08/22/2014)  . CAD (coronary artery disease)    a. CAD s/p 2 stent placement in [place] 2016. b. Neg nuc 05/2014 & 01/2016.  Marland Kitchen. CKD (chronic kidney disease), stage IV (HCC)    [name]/notes 08/22/2014  . Daily headache   . Depression    "I always get that" (08/22/2014)  . Diabetes mellitus  (HCC)   . DVT (deep venous thrombosis) (HCC) ?   RLE  . GERD (gastroesophageal reflux disease)   . Heart murmur   . Hypercholesterolemia   . Hypertension   . Myocardial infarction (HCC)   . Wears glasses     Family History:   Hypertension- mother, father Diabetes mellitus-brother, grandmother Heart disease-father who died at age 370 of heart attack  Social History:   Former smoker 2 pack/day for 8 years, stopped smoking 20 years ago No drug use Alcohol use occasionally; last drink yesterday 01/12/2017 1 beer bottle  Review of Systems: A complete ROS was negative except as per HPI.   Physical Exam: Blood pressure (!) 188/107, pulse (!) 56, temperature 98.1 F (36.7 C), temperature source Oral, resp. rate 14, height 5\' 7"  (1.702 m), weight 230 lb (104.3 kg), SpO2 99 %.  Physical Exam  Constitutional: He appears well-developed and well-nourished.  HENT:  Head: Normocephalic and atraumatic.  Cardiovascular: Normal rate, regular rhythm and intact distal pulses.  Pulmonary/Chest: Effort normal and breath sounds normal. No accessory muscle usage. No respiratory distress.  Abdominal: Soft. Bowel sounds are normal. He exhibits no distension. There is no tenderness.  Neurological: He is alert.  Psychiatric: He has a normal mood and affect. His behavior is normal. His mood appears not anxious. He is not agitated.  Vitals reviewed.  CMP Latest Ref Rng & Units 01/13/2017 12/23/2016 12/23/2016  Glucose 65 - 99 mg/dL 956(O119(H) 130(Q109(H) 657(Q109(H)  BUN 6 - 20 mg/dL 46(N35(H) 62(X31(H) 52(W33(H)  Creatinine 0.61 - 1.24 mg/dL 4.13(K3.41(H) 4.40(N3.90(H) 0.27(O3.76(H)  Sodium 135 - 145 mmol/L 136 140 136  Potassium 3.5 - 5.1 mmol/L 3.6 3.7 3.6  Chloride 101 - 111 mmol/L 106 102 103  CO2 22 - 32 mmol/L 22 - 24  Calcium 8.9 - 10.3 mg/dL 8.2(L) - 8.4(L)  Total Protein 6.5 - 8.1 g/dL - - 6.5  Total Bilirubin 0.3 - 1.2 mg/dL - - 0.5  Alkaline Phos 38 - 126 U/L - - 65  AST 15 - 41 U/L - - 18  ALT 17 - 63 U/L - - 14(L)   CBC     Component Value Date/Time   WBC 4.6 01/13/2017 0837   RBC 3.74 (L) 01/13/2017 0837   HGB 10.8 (L) 01/13/2017 0837   HCT 32.8 (L) 01/13/2017 0837   PLT 243 01/13/2017 0837   MCV 87.7 01/13/2017 0837   MCH 28.9 01/13/2017 0837   MCHC 32.9 01/13/2017 0837   RDW 14.0 01/13/2017 0837   LYMPHSABS 2.7 12/23/2016 0636   MONOABS 0.9 12/23/2016 0636   EOSABS 0.2 12/23/2016 0636   BASOSABS 0.0 12/23/2016 0636     EKG: Sinus rhythm, left ventricular  hypertrophy, possible T wave abnormalities.  No change since previous EKG  CXR: Stable cardiomegaly without any cardiopulmonary abnormalities  Assessment & Plan by Problem:  61 year old Kainen with PMH of CKD 4, diabetes mellitus, hypertension, hyperlipidemia who presents with acute onset chest pain radiating to left arm.  Chest pain The patient presented with chest pain that lasted a few hours, left anterior chest pain with radiation down left arm Richard Barnett has several cardiac risk factors such as hypertension, previous stent placement, diabetes, and hyperlipidemia.   The patient's EKG on admission did not show any significant ST, depression ST elevation, wave changes.  EKG was unchanged from previous EKGs.  The patient's chest x-ray did not show any cardiopulmonary abnormalities.  Based on presentation, chest x-ray, EKG the patient is unlikely to have dissection, PE, pneumonia.  The patient has not been compliant with several of his medications and he has underlying coronary artery disease which may prompt a new cardiac event.  However, the patient's troponin thus far has been remained at low levels and therefore the chest pain can be due to demand ischemia.  Patient has a heart score of 7 which makes his risk of a major adverse cardiac event at 50-65%  -Trend troponins: 0.03, 0.10, 0.18 -Repeat echocardiogram -UDS pending -Continuous cardiac monitoring and continuous pulse oximetry -Cardiology consulted and will determine need for possible cardiac  catheterization based on rising levels of troponin. -Continue pantoprazole 20 mg daily -Nitroglycerin 0.4 mg sublingual every 5 minutes as needed -Continue isosorbide mononitrate 30 mg daily -Heparin 1250 units/hr at 12.64ml/hr, follow heparin level -Continue 0.9% sodium chloride bolus once -Continue atorvastatin 40mg  qd  Hypertension The patient's blood pressure during this admission has ranged 125-188/82 107. -Continue clonidine 0.2 mg twice daily  Dispo: Admit patient to Inpatient with expected length of stay greater than 2 midnights.  Signed: Lorenso Courier, MD Internal Medicine PGY1 [phone] 01/13/2017, 6:11 PM

## 2017-01-13 NOTE — ED Provider Notes (Signed)
[facility] EMERGENCY DEPARTMENT Provider Note   CSN: [id number] Arrival date & time: 01/13/17  6962     History   Chief Complaint Chief Complaint  Patient presents with  . Chest Pain    HPI Richard Barnett is a 61 y.o. Richard Barnett.  The history is provided by the patient. No language interpreter was used.  Chest Pain      Richard Barnett is a 61 y.o. Richard Barnett who presents to the Emergency Department complaining of chest pain.  He presents to the emergency department via EMS for evaluation of chest pain and feeling weird.  He was feeling well yesterday when awaking this morning he reports left-sided chest pain as well as shoulder pain with feeling a weird and uncomfortable.  His chest pain is described as a weird feeling.  He thinks his blood pressure is high.  He did take his medications this morning but he has been out of his aspirin for the last 3 days.  He received 4 81 mg aspirins by EMS as well as nitroglycerin.  His pain is significantly improved with the next nitroglycerin but he refuses any additional nitroglycerin.  He states he had a stent placed in his heart several years ago..  Past Medical History:  Diagnosis Date  . Arthritis    "right leg" (08/22/2014)  . CAD (coronary artery disease)    a. CAD s/p 2 stent placement in [place] 2016. b. Neg nuc 05/2014 & 01/2016.  Marland Kitchen CKD (chronic kidney disease), stage IV (HCC)    Richard Barnett 08/22/2014  . Daily headache   . Depression    "I always get that" (08/22/2014)  . Diabetes mellitus (HCC)   . DVT (deep venous thrombosis) (HCC) ?   RLE  . GERD (gastroesophageal reflux disease)   . Heart murmur   . Hypercholesterolemia   . Hypertension   . Myocardial infarction (HCC)   . Wears glasses     Patient Active Problem List   Diagnosis Date Noted  . Diabetes mellitus (HCC)   . Anemia of chronic renal failure   . Secondary hyperparathyroidism of renal origin (HCC)   . Proteinuria   . Arthritis   . Depression   . Deep  vein thrombosis (HCC)   . Myocardial infarction (HCC)   . Heart failure, unspecified (HCC)   . Heart murmur   . Prediabetes 02/22/2015  . Chronic kidney disease, stage IV (severe) (HCC)   . Hyperlipidemia 05/22/2014  . Chest pain 05/21/2014  . Hypokalemia 05/21/2014  . CAD (coronary artery disease) 05/21/2014  . Hypertension 05/21/2014    Past Surgical History:  Procedure Laterality Date  . CORONARY ANGIOPLASTY WITH STENT PLACEMENT     "1 + 1"       Home Medications    Prior to Admission medications   Medication Sig Start Date End Date Taking? Authorizing Provider  amLODipine (NORVASC) 10 MG tablet TAKE ONE (1) TABLET BY MOUTH EVERY DAY 01/08/17   Fayrene Helper, PA-C  aspirin 81 MG EC tablet Take 1 tablet (81 mg total) by mouth daily. Swallow whole. 02/10/16   Massie Maroon, FNP  atorvastatin (LIPITOR) 40 MG tablet TAKE ONE TABLET BY MOUTH EVERY EVENING AT SIX P.M. 11/09/16   Liberty Handy, PA-C  Brinzolamide-Brimonidine [facility]) 1-0.2 % SUSP Place 1 drop into both eyes 2 (two) times daily.    [provider]  citalopram (CELEXA) 40 MG tablet Take 40 mg by mouth daily.    [provider]  cloNIDine (CATAPRES) 0.2 MG tablet TAKE ONE (1) TABLET BY MOUTH TWO (2) TIMES DAILY 01/08/17   Fayrene Helperran, Bowie, PA-C  isosorbide mononitrate (IMDUR) 30 MG 24 hr tablet Take 1 tablet (30 mg total) by mouth daily. 11/09/16   Liberty HandyGibbons, Claudia J, PA-C  latanoprost (XALATAN) 0.005 % ophthalmic solution Place 1 drop into both eyes at bedtime.    [provider]  meloxicam (MOBIC) 15 MG tablet Take 15 mg by mouth daily.    [provider]  metoprolol tartrate (LOPRESSOR) 50 MG tablet TAKE ONE (1) TABLET BY MOUTH TWO (2) TIMES DAILY 01/08/17   Fayrene Helperran, Bowie, PA-C  nitroGLYCERIN (NITROSTAT) 0.4 MG SL tablet DISSOLVE 1 TABLET UNDER THE TONGUE AS NEEDED FOR CHEST PAIN. REPEAT AS NEEDED EVERY 5 MINUTES UP TO A TOTAL OF 3 DOSES 12/15/15   Henrietta HooverBernhardt, Linda C, NP  omeprazole  (PRILOSEC OTC) 20 MG tablet Take 1 tablet (20 mg total) by mouth daily. 08/11/16 08/11/17  Thomasene Lotaylor, James, MD  oxyCODONE-acetaminophen (PERCOCET/ROXICET) 5-325 MG tablet Take 1 tablet by mouth every 6 (six) hours as needed. Patient not taking: Reported on 12/23/2016 10/29/16   Lars Mageollins, Emma M, PA-C    Family History Family History  Problem Relation Age of Onset  . Hypertension Mother   . Kidney disease Mother   . Heart disease Mother   . Heart disease Father   . Hypertension Father     Social History Social History   Tobacco Use  . Smoking status: Former Smoker    Packs/day: 0.00    Years: 40.00    Pack years: 0.00    Types: Cigarettes    Last attempt to quit: 08/29/2010    Years since quitting: 6.3  . Smokeless tobacco: Never Used  . Tobacco comment: "quit smoking cigarettes in  2011" 10/27/16  Substance Use Topics  . Alcohol use: No    Comment: 10/27/16 "stopped in ~ 2011"  . Drug use: No    Comment: 10/27/16 "stopped in ~ 2011; all types of drugs""     Allergies   Penicillins   Review of Systems Review of Systems  Cardiovascular: Positive for chest pain.  All other systems reviewed and are negative.    Physical Exam Updated Vital Signs BP 125/80 (BP Location: Right Arm)   Pulse (!) 58   Temp 98.1 F (36.7 C) (Oral)   Resp 16   Ht 5\' 7"  (1.702 m)   Wt 104.3 kg (230 lb)   SpO2 99%   BMI 36.02 kg/m   Physical Exam  Constitutional: He is oriented to person, place, and time. He appears well-developed and well-nourished.  HENT:  Head: Normocephalic and atraumatic.  Cardiovascular: Regular rhythm and normal pulses. Bradycardia present.  No murmur heard. Pulmonary/Chest: Effort normal and breath sounds normal. No respiratory distress.  Abdominal: Soft. There is no tenderness. There is no rebound and no guarding.  Musculoskeletal: He exhibits no tenderness.  Trace pitting edema to bilateral lower extremities  Neurological: He is alert and oriented to person,  place, and time.  Skin: Skin is warm and dry.  Psychiatric: He has a normal mood and affect. His behavior is normal.  Nursing note and vitals reviewed.    ED Treatments / Results  Labs (all labs ordered are listed, but only abnormal results are displayed) Labs Reviewed  BASIC METABOLIC PANEL  CBC  I-STAT TROPONIN, ED    EKG  EKG Interpretation  Date/Time:  Thursday January 13 2017 08:27:10 EST Ventricular Rate:  59 PR Interval:  QRS Duration: 83 QT Interval:  450 QTC Calculation: 446 R Axis:   12 Text Interpretation:  Sinus rhythm Probable left atrial enlargement Left ventricular hypertrophy Abnormal T, consider ischemia, diffuse leads No significant change since last tracing Confirmed by Tilden Fossa [phone]) on 01/13/2017 8:31:48 AM       Radiology No results found.  Procedures Procedures (including critical care time)  Medications Ordered in ED Medications - No data to display   Initial Impression / Assessment and Plan / ED Course  I have reviewed the triage vital signs and the nursing notes.  Pertinent labs & imaging results that were available during my care of the patient were reviewed by me and considered in my medical decision making (see chart for details).     Patient here for evaluation of chest pain that started earlier today.  He is pain-free in the emergency department his EKG is similar compared to priors.  Initial troponin is normal.  Repeat troponin is elevated.  Patient has difficulty describing his symptoms and does have some mild confusion that appears to be at his baseline.  He has remained pain-free on multiple reassessments in the emergency department.  Cardiology consulted for recommendations.  Presentation is not consistent with dissection, PE, pneumonia.  Final Clinical Impressions(s) / ED Diagnoses   Final diagnoses:  None    ED Discharge Orders    None       Tilden Fossa, MD 01/13/17 [phone]

## 2017-01-13 NOTE — Progress Notes (Signed)
ANTICOAGULATION CONSULT NOTE - Initial Consult  Pharmacy Consult for heparin Indication: chest pain/ACS  Allergies  Allergen Reactions  . Penicillins Swelling and Rash    ANGIOEDEMA "SWELLING OF ENTIRE BODY"  Has patient had a PCN reaction causing immediate rash, facial/tongue/throat swelling, SOB or lightheadedness with hypotension: No Has patient had a PCN reaction causing severe rash involving mucus membranes or skin necrosis: No Has patient had a PCN reaction that required hospitalization No Has patient had a PCN reaction occurring within the last 10 years: No If all of the above answers are "NO", then may proceed with Cephalosporin use.     Patient Measurements: Height: 5\' 7"  (170.2 cm) Weight: 230 lb (104.3 kg) IBW/kg (Calculated) : 66.1 Heparin Dosing Weight: 89 Kg  Vital Signs: Temp: 98.1 F (36.7 C) (11/08 0837) Temp Source: Oral (11/08 0837) BP: 173/90 (11/08 1327) Pulse Rate: 57 (11/08 1327)  Labs: Recent Labs    01/13/17 0837  HGB 10.8*  HCT 32.8*  PLT 243  CREATININE 3.41*    Estimated Creatinine Clearance: 25.9 mL/min (A) (by C-G formula based on SCr of 3.41 mg/dL (H)).   Medical History: Past Medical History:  Diagnosis Date  . Arthritis    "right leg" (08/22/2014)  . CAD (coronary artery disease)    a. CAD s/p 2 stent placement in [place] 2016. b. Neg nuc 05/2014 & 01/2016.  Marland Kitchen CKD (chronic kidney disease), stage IV (HCC)    [name] 08/22/2014  . Daily headache   . Depression    "I always get that" (08/22/2014)  . Diabetes mellitus (HCC)   . DVT (deep venous thrombosis) (HCC) ?   RLE  . GERD (gastroesophageal reflux disease)   . Heart murmur   . Hypercholesterolemia   . Hypertension   . Myocardial infarction (HCC)   . Wears glasses    Assessment:  Richard Barnett is a 61 y.o. Richard Barnett complaining of chest pain not on anticoagulation prior to admit. Troponin elevated to 0.10; CBC stable from prior labs; HgB 10.8   Goal of Therapy:  Heparin  level 0.3-0.7 units/ml Monitor platelets by anticoagulation protocol: Yes   Plan:  Give 4000 units bolus x 1 Start heparin infusion at 1250 units/hr Check anti-Xa level in 8 hours and daily while on heparin Continue to monitor H&H and platelets  Richard Barnett Richard Barnett 01/13/2017,1:34 PM

## 2017-01-13 NOTE — Progress Notes (Signed)
Patient c/o chest pain 8/10 and refused PRN Nitro stating  "it makes my head hurt".  Explained to patient the importance of taking Nitro, but he continues to refused. PRN Tylenol given was not effective; rates pain 7/10.  On call IM MD made aware, order received for Morphine 2 mg x 1, EKG and Nitro x 1. Will continue to monitor

## 2017-01-13 NOTE — ED Notes (Signed)
Heart healthy dinner tray for patient.

## 2017-01-13 NOTE — ED Notes (Signed)
Pt asked about food and was told that there were still tests to run. Pt seems to be unable to fully process that he will be staying in the hospital as an inpatient and continues to ask about why he is here so long. Explained to the patient about the lab results and why we are concerned about his health.

## 2017-01-13 NOTE — Consult Note (Signed)
Cardiology Admission History and Physical:   Patient ID: Richard Barnett; MRN: [id number]; DOB: 1956/01/26   Admission date: 01/13/2017  Primary Care Provider: Patient, No Pcp Per Primary Cardiologist: Dr. Ian Malkin cardiology [facility] Primary Electrophysiologist:  NA  Chief Complaint:  Chest pain and elevated troponin  Richard Barnett is a 61 y.o. Dmarius who is being seen today for the evaluation of chest pain at the request of Dr. Pecola Leisure.   Patient Profile:   Richard Barnett is a 61 y.o. [name] with a history of CAD with stents placed 2016 in [place], hx MI, DM, HLD, HTN and CKD stage IV.    History of Present Illness:   [name] has a history of CAD with stents placed 2016 in [place], hx MI, DM, HLD, HTN and CKD stage IV.  Last note I find in computer from cards 06/09/16 with abnormal nuc but with medical therapy due to CKD.  He has AV graft in his Lt arm.  Pt has poor memory.   Pt stated today his stents were in [place], [place] first 3 weeks ago then 2016.  Originally seen by our group in 2016 but he did not come in for follow up.  + FH CAD.  He had AV graft placed 10/01/16.    He tells me he is on 5 meds but he should be on 7.  One of these is clonidine not sure he is taking. He was seen 01/08/17   Pt presents to ER today by EMS with complaints of chest pain, face pain, headache and numbness in Rt AM.  Some nausea .   He felt bad but [place] and felt so bad went back up stairs and called EMS.  Some SOB His BP at home was 200/140.  Recently seen in ER     EKG with inf lat T wave inversion Q wave in V1 old. LVH with strain. Was personally reviewed Troponin was 0.03 now 0.10  Cr 3.41, Glucose 119, K+ 3.6, GFR 18  Hgb 10.8 CXR 2V Stable cardiomegaly without acute abnormality.  Currently his left arm is better but still has some chest pain.  He has a speech impediment and is difficult to understand at times.   Past Medical History:  Diagnosis  Date  . Arthritis    "right leg" (08/22/2014)  . CAD (coronary artery disease)    a. CAD s/p 2 stent placement in [place] 2016. b. Neg nuc 05/2014 & 01/2016.  Marland Kitchen CKD (chronic kidney disease), stage IV (HCC)    [name] 08/22/2014  . Daily headache   . Depression    "I always get that" (08/22/2014)  . Diabetes mellitus (HCC)   . DVT (deep venous thrombosis) (HCC) ?   RLE  . GERD (gastroesophageal reflux disease)   . Heart murmur   . Hypercholesterolemia   . Hypertension   . Myocardial infarction (HCC)   . Wears glasses     Past Surgical History:  Procedure Laterality Date  . CORONARY ANGIOPLASTY WITH STENT PLACEMENT     "1 + 1"     Medications Prior to Admission: Prior to Admission medications   Medication Sig Start Date End Date Taking? Authorizing Provider  amLODipine (NORVASC) 10 MG tablet TAKE ONE (1) TABLET BY MOUTH EVERY DAY 01/08/17  Yes Fayrene Helper, PA-C  aspirin 81 MG EC tablet Take 1 tablet (81 mg total) by mouth daily. Swallow whole. 02/10/16  Yes [name],  Rosalene BillingsLachina M, FNP  atorvastatin (LIPITOR) 40 MG tablet TAKE ONE TABLET BY MOUTH EVERY EVENING AT SIX P.M. 11/09/16  Yes Liberty HandyGibbons, Claudia J, PA-C  Brinzolamide-Brimonidine [facility](SIMBRINZA) 1-0.2 % SUSP Place 1 drop into both eyes 2 (two) times daily.   Yes [provider]  citalopram (CELEXA) 40 MG tablet Take 40 mg by mouth daily.   Yes [provider]  cloNIDine (CATAPRES) 0.2 MG tablet TAKE ONE (1) TABLET BY MOUTH TWO (2) TIMES DAILY 01/08/17  Yes Fayrene Helperran, Bowie, PA-C  isosorbide mononitrate (IMDUR) 30 MG 24 hr tablet Take 1 tablet (30 mg total) by mouth daily. 11/09/16  Yes Sharen HeckGibbons, Claudia J, PA-C  latanoprost (XALATAN) 0.005 % ophthalmic solution Place 1 drop into both eyes at bedtime.   Yes [provider]  meloxicam (MOBIC) 15 MG tablet Take 15 mg by mouth daily.   Yes [provider]  metoprolol tartrate (LOPRESSOR) 50 MG tablet TAKE ONE (1) TABLET BY MOUTH TWO (2) TIMES DAILY 01/08/17  Yes Fayrene Helperran,  Bowie, PA-C  omeprazole (PRILOSEC OTC) 20 MG tablet Take 1 tablet (20 mg total) by mouth daily. 08/11/16 08/11/17 Yes Thomasene Lotaylor, James, MD  oxyCODONE-acetaminophen (PERCOCET/ROXICET) 5-325 MG tablet Take 1 tablet by mouth every 6 (six) hours as needed. 10/29/16  Yes Lars Mageollins, Emma M, PA-C  nitroGLYCERIN (NITROSTAT) 0.4 MG SL tablet DISSOLVE 1 TABLET UNDER THE TONGUE AS NEEDED FOR CHEST PAIN. REPEAT AS NEEDED EVERY 5 MINUTES UP TO A TOTAL OF 3 DOSES 12/15/15   Henrietta HooverBernhardt, Linda C, NP     Allergies:    Allergies  Allergen Reactions  . Penicillins Swelling and Rash    ANGIOEDEMA "SWELLING OF ENTIRE BODY"  Has patient had a PCN reaction causing immediate rash, facial/tongue/throat swelling, SOB or lightheadedness with hypotension: No Has patient had a PCN reaction causing severe rash involving mucus membranes or skin necrosis: No Has patient had a PCN reaction that required hospitalization No Has patient had a PCN reaction occurring within the last 10 years: No If all of the above answers are "NO", then may proceed with Cephalosporin use.     Social History:   Social History   Socioeconomic History  . Marital status: Divorced    Spouse name: Not on file  . Number of children: Not on file  . Years of education: Not on file  . Highest education level: Not on file  Social Needs  . Financial resource strain: Not on file  . Food insecurity - worry: Not on file  . Food insecurity - inability: Not on file  . Transportation needs - medical: Not on file  . Transportation needs - non-medical: Not on file  Occupational History  . Not on file  Tobacco Use  . Smoking status: Former Smoker    Packs/day: 0.00    Years: 40.00    Pack years: 0.00    Types: Cigarettes    Last attempt to quit: 08/29/2010    Years since quitting: 6.3  . Smokeless tobacco: Never Used  . Tobacco comment: "quit smoking cigarettes in  2011" 10/27/16  Substance and Sexual Activity  . Alcohol use: No    Comment: 10/27/16  "stopped in ~ 2011"  . Drug use: No    Comment: 10/27/16 "stopped in ~ 2011; all types of drugs""  . Sexual activity: No  Other Topics Concern  . Not on file  Social History Narrative  . Not on file    Family History:   The patient's family history includes Heart disease  in his father and mother; Hypertension in his father and mother; Kidney disease in his mother.    ROS:  Please see the history of present illness.  General:no colds or fevers, no weight changes Skin:no rashes or ulcers HEENT:no blurred vision, no congestion CV:see HPI PUL:see HPI GI:no diarrhea constipation or melena, no indigestion GU:no hematuria, no dysuria MS:+ joint pain from arthritis, no claudication Neuro:no syncope, no lightheadedness Endo:no diabetes, no thyroid disease      Physical Exam/Data:   Vitals:   01/13/17 1030 01/13/17 1245 01/13/17 1327 01/13/17 1330  BP: (!) 149/91 (!) 167/84 (!) 173/90 (!) 149/91  Pulse: (!) 56 (!) 54 (!) 57 (!) 57  Resp: 13 14 16  (!) 9  Temp:      TempSrc:      SpO2: 100% 100% 100% 100%  Weight:      Height:       No intake or output data in the 24 hours ending 01/13/17 1403 Filed Weights   01/13/17 0832  Weight: 230 lb (104.3 kg)   Body mass index is 36.02 kg/m.  General:  Well nourished, well developed, in no acute distress, mildly frustrated  HEENT: normal Lymph: no adenopathy Neck: no JVD Endocrine:  No thryomegaly Vascular: No carotid bruits; 2+ pedal pulses no edema Cardiac:  normal S1, S2; RRR; no murmur gallup rub or click Lungs:  clear to auscultation bilaterally, no wheezing, rhonchi or rales  Abd: soft, nontender, no hepatomegaly  Ext: no edema, Lt arm AV graft with thrill and bruit Musculoskeletal:  No deformities, BUE and BLE strength normal and equal Skin: warm and dry  Neuro:  Alert and oriented X3, MAE follows commands, no focal abnormalities noted Psych:  Normal affect     Relevant CV Studies: ECHO 05/2014 Study Conclusions  -  Left ventricle: The cavity size was normal. There was mild concentric hypertrophy. 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 Systolic function was normal. The estimated ejection fraction was in the range of 55% to 60%. Wall motion was normal; there were no regional wall motion abnormalities. Doppler parameters are consistent with abnormal left ventricular relaxation (grade 1 diastolic dysfunction). - Aortic valve: There was trivial regurgitation. - Left atrium: The atrium was mildly dilated. - Pulmonary arteries: Systolic pressure was mildly increased. PA peak pressure: 36 mm Hg (S).  Laboratory Data:  Chemistry Recent Labs  Lab 01/13/17 0837  NA 136  K 3.6  CL 106  CO2 22  GLUCOSE 119*  BUN 35*  CREATININE 3.41*  CALCIUM 8.2*  GFRNONAA 18*  GFRAA 21*  ANIONGAP 8    No results for input(s): PROT, ALBUMIN, AST, ALT, ALKPHOS, BILITOT in the last 168 hours. Hematology Recent Labs  Lab 01/13/17 0837  WBC 4.6  RBC 3.74*  HGB 10.8*  HCT 32.8*  MCV 87.7  MCH 28.9  MCHC 32.9  RDW 14.0  PLT 243   Cardiac EnzymesNo results for input(s): TROPONINI in the last 168 hours.  Recent Labs  Lab 01/13/17 0858 01/13/17 1154  TROPIPOC 0.03 0.10*    BNPNo results for input(s): BNP, PROBNP in the last 168 hours.  DDimer No results for input(s): DDIMER in the last 168 hours.  Radiology/Studies:  Dg Chest 2 View  Result Date: 01/13/2017 CLINICAL DATA:  Left-sided pain and weakness EXAM: CHEST  2 VIEW COMPARISON:  12/23/2016 FINDINGS: Cardiac shadow remains enlarged. The lungs are well aerated bilaterally. No focal infiltrate or sizable effusion is seen. No  acute bony abnormality is noted. IMPRESSION: Stable cardiomegaly without acute abnormality. Electronically Signed   By: Alcide Clever M.D.   On: 01/13/2017 09:06    Assessment and Plan:   1. Chest pain, face pain, and arm numbness, now with troponin  0.10 though he does have renal failure.  He also had abnormal nuc study 06/2016 and with renal failure it was thought best to treat medically.  Would do serial troponins, continue IV heparin. Check Echo.  Control BP.  This may be demand ischemia due to HTN. If troponin elevated  2. HTN with visits to ER for elevated BP and headache at home.  He is taking 5 BP meds and should be on 7.  Would resume BP meds to control.   3.        CKD-4 with cr 3.41.  He does have AV fistula but no dialysis at this time.               There is mention of transplant eval, through [facility]. But was                 removed from process due to med. Non-adherence.   4.        HLD is on statin.   For questions or updates, please contact [facility] Please consult [email or web address] for contact info under Cardiology/STEMI.    Signed, Nada Boozer, NP  01/13/2017 2:03 PM   Personally seen and examined. Agree with above.  61 year old Richard Barnett with stage IV chronic kidney disease, left arm AV fistula, known coronary artery disease with 2 separate RCA stents here in the emergency department with chest discomfort, sharp chest pain, facial pain, left arm pain/numbness that was transient earlier this morning at rest while sitting down watching television.  Currently he states that he is feeling minimal discomfort in his chest.  His blood pressure at home was over 200 systolic because of this and his complementary symptoms he decided to call EMS.  He was brought to [facility] emergency department for further evaluation.  While here, his point-of-care troponin became mildly elevated at 0.1.  His blood pressure remained quite elevated but now less than 200.  He states that he used to take 5 medications but now he takes 3.  It is hard for him to remember exactly which ones those are.  He has been evaluated with stress test in the past which was marked as abnormal however after adjusting his medications and controlling his blood pressure,  [place] that they would continue with medical management.  They did not pursue cardiac catheterization.  Previously, he was seen by Dr. Katrinka Blazing here and did not pursue cardiac catheterization either especially given his chronic kidney disease with creatinine in the 3 range.  Exam: Alert and oriented, no acute distress, regular rate and rhythm, no rubs, no JVD, lungs are clear, no edema  Point-of-care troponin 0.1, mildly elevated.  EKG LVH with repolarization abnormalities.  No significant change from prior.  Chest pain with abnormal troponin -His abnormal troponin certainly could be secondary to underlying coronary artery disease however we will continue to trend this and if it remains low level, this may be representative of demand ischemia or supply demand mismatch in the setting of hypertensive urgency.  In that case, we will likely not pursue cardiac catheterization.  He has not been on dialysis as of yet.  If his troponin increases markedly, we will need to consider strongly coronary angiography  given his multitude of risk factors and known coronary artery disease.  I worry about compliance with dual antiplatelet therapy if necessary.  Hopefully if we are able to successfully decrease his blood pressure, we will help him with any further anginal symptoms.  We will repeat echocardiogram.  We will continue to follow along.  Donato Schultz, MD

## 2017-01-13 NOTE — ED Triage Notes (Signed)
Pt stated he felt chest pain, headache and numbness in his right arm this morning at approx 6am. Pt is also concerned about his high blood pressure

## 2017-01-13 NOTE — ED Notes (Signed)
Pt was found dressed and walking out of the room to go home. Pt stated that MD said he could go and he did not want to wait anymore. Dr. Truitt Merle and Pt stated he would wait but would not allow vitals to be taken.

## 2017-01-13 NOTE — ED Notes (Signed)
Pt c/o of no dinner tray and has threatened to leave. This is the 3rd time during the day the patient has stated he wants to leave. Each time pt is told about the serious nature of his health and patient decides to stay. Pt appears to react as if he has never been told about the serious nature of his health each time he is told.

## 2017-01-14 ENCOUNTER — Encounter (HOSPITAL_COMMUNITY)
Admission: EM | Disposition: A | Discharge status: Home / Self Care | Payer: Self-pay | Source: Home / Self Care | Attending: Internal Medicine

## 2017-01-14 DIAGNOSIS — E1122 Type 2 diabetes mellitus with diabetic chronic kidney disease: Secondary | ICD-10-CM | POA: Diagnosis present

## 2017-01-14 DIAGNOSIS — N184 Chronic kidney disease, stage 4 (severe): Secondary | ICD-10-CM

## 2017-01-14 DIAGNOSIS — Z79899 Other long term (current) drug therapy: Secondary | ICD-10-CM

## 2017-01-14 DIAGNOSIS — Z87891 Personal history of nicotine dependence: Secondary | ICD-10-CM | POA: Diagnosis not present

## 2017-01-14 DIAGNOSIS — R079 Chest pain, unspecified: Secondary | ICD-10-CM | POA: Diagnosis not present

## 2017-01-14 DIAGNOSIS — I770 Arteriovenous fistula, acquired: Secondary | ICD-10-CM | POA: Diagnosis not present

## 2017-01-14 DIAGNOSIS — I2510 Atherosclerotic heart disease of native coronary artery without angina pectoris: Secondary | ICD-10-CM | POA: Diagnosis not present

## 2017-01-14 DIAGNOSIS — Z955 Presence of coronary angioplasty implant and graft: Secondary | ICD-10-CM

## 2017-01-14 DIAGNOSIS — I25110 Atherosclerotic heart disease of native coronary artery with unstable angina pectoris: Secondary | ICD-10-CM | POA: Diagnosis present

## 2017-01-14 DIAGNOSIS — F329 Major depressive disorder, single episode, unspecified: Secondary | ICD-10-CM | POA: Diagnosis present

## 2017-01-14 DIAGNOSIS — E785 Hyperlipidemia, unspecified: Secondary | ICD-10-CM

## 2017-01-14 DIAGNOSIS — I129 Hypertensive chronic kidney disease with stage 1 through stage 4 chronic kidney disease, or unspecified chronic kidney disease: Secondary | ICD-10-CM

## 2017-01-14 DIAGNOSIS — K219 Gastro-esophageal reflux disease without esophagitis: Secondary | ICD-10-CM | POA: Diagnosis present

## 2017-01-14 DIAGNOSIS — R0789 Other chest pain: Secondary | ICD-10-CM | POA: Diagnosis not present

## 2017-01-14 DIAGNOSIS — R7989 Other specified abnormal findings of blood chemistry: Secondary | ICD-10-CM | POA: Diagnosis not present

## 2017-01-14 DIAGNOSIS — Z8249 Family history of ischemic heart disease and other diseases of the circulatory system: Secondary | ICD-10-CM | POA: Diagnosis not present

## 2017-01-14 DIAGNOSIS — I160 Hypertensive urgency: Secondary | ICD-10-CM | POA: Diagnosis present

## 2017-01-14 DIAGNOSIS — I10 Essential (primary) hypertension: Secondary | ICD-10-CM | POA: Diagnosis not present

## 2017-01-14 DIAGNOSIS — R748 Abnormal levels of other serum enzymes: Secondary | ICD-10-CM

## 2017-01-14 DIAGNOSIS — Z880 Allergy status to penicillin: Secondary | ICD-10-CM | POA: Diagnosis not present

## 2017-01-14 DIAGNOSIS — I25119 Atherosclerotic heart disease of native coronary artery with unspecified angina pectoris: Secondary | ICD-10-CM | POA: Diagnosis not present

## 2017-01-14 DIAGNOSIS — Z7982 Long term (current) use of aspirin: Secondary | ICD-10-CM | POA: Diagnosis not present

## 2017-01-14 DIAGNOSIS — I214 Non-ST elevation (NSTEMI) myocardial infarction: Secondary | ICD-10-CM | POA: Diagnosis present

## 2017-01-14 DIAGNOSIS — I252 Old myocardial infarction: Secondary | ICD-10-CM | POA: Diagnosis not present

## 2017-01-14 DIAGNOSIS — I120 Hypertensive chronic kidney disease with stage 5 chronic kidney disease or end stage renal disease: Secondary | ICD-10-CM | POA: Diagnosis present

## 2017-01-14 DIAGNOSIS — Z833 Family history of diabetes mellitus: Secondary | ICD-10-CM | POA: Diagnosis not present

## 2017-01-14 DIAGNOSIS — N185 Chronic kidney disease, stage 5: Secondary | ICD-10-CM | POA: Diagnosis present

## 2017-01-14 HISTORY — PX: LEFT HEART CATH AND CORONARY ANGIOGRAPHY: CATH118249

## 2017-01-14 LAB — TROPONIN I
TROPONIN I: 0.15 ng/mL — AB (ref <0.03)
Troponin I: 0.21 ng/mL (ref <0.03)
Troponin I: 0.11 ng/mL (ref <0.03)

## 2017-01-14 LAB — CBC
HCT: 32.8 % — ABNORMAL LOW (ref 39.0–52.0)
HCT: 33.4 % — ABNORMAL LOW (ref 39.0–52.0)
HEMOGLOBIN: 11.1 g/dL — AB (ref 13.0–17.0)
Hemoglobin: 10.5 g/dL — ABNORMAL LOW (ref 13.0–17.0)
MCH: 28.5 pg (ref 26.0–34.0)
MCH: 29.4 pg (ref 26.0–34.0)
MCHC: 32 g/dL (ref 30.0–36.0)
MCHC: 33.2 g/dL (ref 30.0–36.0)
MCV: 89.1 fL (ref 78.0–100.0)
MCV: 88.4 fL (ref 78.0–100.0)
PLATELETS: 243 10*3/uL (ref 150–400)
Platelets: 222 10*3/uL (ref 150–400)
RBC: 3.68 MIL/uL — AB (ref 4.22–5.81)
RBC: 3.78 MIL/uL — AB (ref 4.22–5.81)
RDW: 14.6 % (ref 11.5–15.5)
RDW: 14.7 % (ref 11.5–15.5)
WBC: 6 10*3/uL (ref 4.0–10.5)
WBC: 6.2 10*3/uL (ref 4.0–10.5)

## 2017-01-14 LAB — HEPARIN LEVEL (UNFRACTIONATED): HEPARIN UNFRACTIONATED: 0.48 [IU]/mL (ref 0.30–0.70)

## 2017-01-14 LAB — COMPREHENSIVE METABOLIC PANEL
ALT: 14 U/L — AB (ref 17–63)
AST: 17 U/L (ref 15–41)
Albumin: 2.9 g/dL — ABNORMAL LOW (ref 3.5–5.0)
Alkaline Phosphatase: 54 U/L (ref 38–126)
Anion gap: 10 (ref 5–15)
BUN: 39 mg/dL — ABNORMAL HIGH (ref 6–20)
CHLORIDE: 106 mmol/L (ref 101–111)
CO2: 20 mmol/L — AB (ref 22–32)
CREATININE: 3.69 mg/dL — AB (ref 0.61–1.24)
Calcium: 8.1 mg/dL — ABNORMAL LOW (ref 8.9–10.3)
GFR calc non Af Amer: 16 mL/min — ABNORMAL LOW (ref >60)
GFR, EST AFRICAN AMERICAN: 19 mL/min — AB (ref >60)
Glucose, Bld: 141 mg/dL — ABNORMAL HIGH (ref 65–99)
Potassium: 3.9 mmol/L (ref 3.5–5.1)
SODIUM: 136 mmol/L (ref 135–145)
Total Bilirubin: 0.4 mg/dL (ref 0.3–1.2)
Total Protein: 5.7 g/dL — ABNORMAL LOW (ref 6.5–8.1)

## 2017-01-14 LAB — CREATININE, SERUM
Creatinine, Ser: 3.68 mg/dL — ABNORMAL HIGH (ref 0.61–1.24)
GFR calc Af Amer: 19 mL/min — ABNORMAL LOW (ref >60)
GFR calc non Af Amer: 16 mL/min — ABNORMAL LOW (ref >60)

## 2017-01-14 LAB — PROTIME-INR
INR: 0.99
Prothrombin Time: 13 seconds (ref 11.4–15.2)

## 2017-01-14 LAB — POCT ACTIVATED CLOTTING TIME: ACTIVATED CLOTTING TIME: 120 s

## 2017-01-14 LAB — GLUCOSE, CAPILLARY
GLUCOSE-CAPILLARY: 116 mg/dL — AB (ref 65–99)
Glucose-Capillary: 118 mg/dL — ABNORMAL HIGH (ref 65–99)

## 2017-01-14 SURGERY — LEFT HEART CATH AND CORONARY ANGIOGRAPHY
Anesthesia: LOCAL

## 2017-01-14 MED ORDER — ASPIRIN 81 MG PO CHEW: 81.0000 mg | CHEWABLE_TABLET | ORAL | Status: DC

## 2017-01-14 MED ORDER — SODIUM CHLORIDE 0.9% FLUSH
3.0000 mL | Freq: Two times a day (BID) | INTRAVENOUS | Status: DC
Start: 2017-01-14 — End: 2017-01-14

## 2017-01-14 MED ORDER — HEPARIN (PORCINE) IN NACL 2-0.9 UNIT/ML-% IJ SOLN
INTRAMUSCULAR | Status: AC
  Filled 2017-01-14: qty 1000

## 2017-01-14 MED ORDER — ASPIRIN 81 MG PO CHEW
81.0000 mg | CHEWABLE_TABLET | ORAL | Status: AC
  Administered 2017-01-14: 81 mg via ORAL
  Filled 2017-01-14: qty 1

## 2017-01-14 MED ORDER — SODIUM CHLORIDE 0.9 % WEIGHT BASED INFUSION
3.0000 mL/kg/h | INTRAVENOUS | Status: DC
  Administered 2017-01-14: 3 mL/kg/h via INTRAVENOUS

## 2017-01-14 MED ORDER — SODIUM CHLORIDE 0.9% FLUSH: 3.0000 mL | INTRAVENOUS | Status: DC | PRN

## 2017-01-14 MED ORDER — SODIUM CHLORIDE 0.9% FLUSH
3.0000 mL | Freq: Two times a day (BID) | INTRAVENOUS | Status: DC
  Administered 2017-01-15: 3 mL via INTRAVENOUS

## 2017-01-14 MED ORDER — FENTANYL CITRATE (PF) 100 MCG/2ML IJ SOLN
INTRAMUSCULAR | Status: AC
  Filled 2017-01-14: qty 2

## 2017-01-14 MED ORDER — SODIUM CHLORIDE 0.9 % WEIGHT BASED INFUSION
1.0000 mL/kg/h | INTRAVENOUS | Status: AC
  Administered 2017-01-14: 1 mL/kg/h via INTRAVENOUS

## 2017-01-14 MED ORDER — SODIUM CHLORIDE 0.9 % IV SOLN: 250.0000 mL | INTRAVENOUS | Status: DC | PRN

## 2017-01-14 MED ORDER — PNEUMOCOCCAL VAC POLYVALENT 25 MCG/0.5ML IJ INJ: 0.5000 mL | INJECTION | INTRAMUSCULAR | Status: DC

## 2017-01-14 MED ORDER — HEPARIN SODIUM (PORCINE) 5000 UNIT/ML IJ SOLN
5000.0000 [IU] | Freq: Three times a day (TID) | INTRAMUSCULAR | Status: DC
  Administered 2017-01-14 – 2017-01-15 (×2): 5000 [IU] via SUBCUTANEOUS
  Filled 2017-01-14 (×2): qty 1

## 2017-01-14 MED ORDER — SODIUM CHLORIDE 0.9% FLUSH: 3.0000 mL | Freq: Two times a day (BID) | INTRAVENOUS | Status: DC

## 2017-01-14 MED ORDER — SODIUM CHLORIDE 0.9 % WEIGHT BASED INFUSION: 3.0000 mL/kg/h | INTRAVENOUS | Status: DC

## 2017-01-14 MED ORDER — HEPARIN (PORCINE) IN NACL 2-0.9 UNIT/ML-% IJ SOLN
INTRAMUSCULAR | Status: AC | PRN
  Administered 2017-01-14: 1000 mL

## 2017-01-14 MED ORDER — MIDAZOLAM HCL 2 MG/2ML IJ SOLN
INTRAMUSCULAR | Status: AC
  Filled 2017-01-14: qty 2

## 2017-01-14 MED ORDER — LIDOCAINE HCL (PF) 1 % IJ SOLN
INTRAMUSCULAR | Status: DC | PRN
  Administered 2017-01-14: 15 mL

## 2017-01-14 MED ORDER — SODIUM CHLORIDE 0.9 % WEIGHT BASED INFUSION: 1.0000 mL/kg/h | INTRAVENOUS | Status: DC

## 2017-01-14 MED ORDER — MIDAZOLAM HCL 2 MG/2ML IJ SOLN
INTRAMUSCULAR | Status: DC | PRN
  Administered 2017-01-14: 1 mg via INTRAVENOUS

## 2017-01-14 MED ORDER — IOPAMIDOL (ISOVUE-370) INJECTION 76%
INTRAVENOUS | Status: AC
  Filled 2017-01-14: qty 100

## 2017-01-14 MED ORDER — LIDOCAINE HCL (PF) 1 % IJ SOLN
INTRAMUSCULAR | Status: AC
  Filled 2017-01-14: qty 30

## 2017-01-14 MED ORDER — IOPAMIDOL (ISOVUE-370) INJECTION 76%
INTRAVENOUS | Status: DC | PRN
  Administered 2017-01-14: 40 mL via INTRA_ARTERIAL

## 2017-01-14 MED ORDER — FENTANYL CITRATE (PF) 100 MCG/2ML IJ SOLN
INTRAMUSCULAR | Status: DC | PRN
  Administered 2017-01-14: 25 ug via INTRAVENOUS

## 2017-01-14 SURGICAL SUPPLY — 6 items
CATH INFINITI 5FR MULTPACK ANG (CATHETERS) ×2 IMPLANT
KIT HEART LEFT (KITS) ×2 IMPLANT
PACK CARDIAC CATHETERIZATION (CUSTOM PROCEDURE TRAY) ×2 IMPLANT
SHEATH PINNACLE 5F 10CM (SHEATH) ×2 IMPLANT
TRANSDUCER W/STOPCOCK (MISCELLANEOUS) ×2 IMPLANT
WIRE EMERALD 3MM-J .035X150CM (WIRE) ×2 IMPLANT

## 2017-01-14 NOTE — Progress Notes (Signed)
   Subjective: Mr. Signer was seen laying in his bed this morning.  States that he still has chest pain on the left that is constant, deep/tight in nature, 7 on 10 intensity.  The patient states that he does not have any nausea/vomiting, but he states that he does have shortness of breath.  Overnight the patient that he has been having chest pain.  The overnight on-call team saw the patient and recommended nitroglycerin for his pain and ordered morphine 2 mg once.  Repeat EKG did not show any acute changes.  Objective:  Vital signs in last 24 hours: Vitals:   01/14/17 0017 01/14/17 0513 01/14/17 0918 01/14/17 1036  BP: 136/82 116/79 135/86 137/88  Pulse:  (!) 57    Resp:      Temp:  97.7 F (36.5 C)    TempSrc:  Oral    SpO2:  100%    Weight:  228 lb 12.8 oz (103.8 kg)    Height:       Physical Exam  Constitutional: He appears well-developed and well-nourished.  Non-toxic appearance. He does not appear ill.  HENT:  Head: Normocephalic and atraumatic.  Cardiovascular: Normal rate and regular rhythm.  Pulmonary/Chest: Effort normal and breath sounds normal. No accessory muscle usage. No respiratory distress.  Abdominal: Soft. Bowel sounds are normal. He exhibits no distension. There is no tenderness.  Neurological: He is alert.  Skin: No rash noted. He is not diaphoretic. No pallor.  Psychiatric: His behavior is normal. His mood appears not anxious. He is not agitated.   Assessment/Plan:  61 year old [place] with PMH of CKD 4, diabetes mellitus, hypertension, hyperlipidemia who presents with acute onset chest pain radiating to left arm.  Chest pain The patient presented with chest pain that lasted a few hours, left anterior chest pain with radiation down left arm she has several cardiac risk factors such as hypertension, previous stent placement, diabetes, and hyperlipidemia.   -The patient's EKG on admission did not show any significant ST, depression ST elevation, wave changes.   EKG was unchanged from previous EKGs.  The patient's chest x-ray did not show any cardiopulmonary abnormalities. -Patient's troponins since admission have been 0.18, 0.21, 0.15 -Repeat echocardiogram showed 55-60% ejection fraction, mild LVH, no regional wall motion abnormalities, grade 2 diastolic dysfunction  The patient has decided to pursue diagnostic cardiac catheterization with dye sparing later this he afternoon 01/14/2017.  Dr. Hyman Hopes from nephrology was informed about the procedure and possible need for hemodialysis.  Bicarbonate was not thought to be helpful in this case but the patient will be given fluids.  -Pre and post catheterization 0.9% nacl  -Heparin being administered and dosed by Masih -Continuous cardiac monitoring and continuous pulse oximetry -Continue pantoprazole 20 mg daily -Nitroglycerin 0.4 mg sublingual every 5 minutes as needed -Continue isosorbide mononitrate 30 mg daily -Continue atorvastatin 40mg  qd  Hypertension The patient's blood pressure during this admission has ranged 116-188/79-107.  The patient's pulse has ranged 50s-60s -Continue clonidine 0.2 mg twice daily  Dispo: Anticipated discharge in approximately 1-2 day(s).   Lorenso Courier, MD Internal Medicine PGY1 [phone] 01/14/2017, 10:49 AM

## 2017-01-14 NOTE — Interval H&P Note (Signed)
History and Physical Interval Note:  01/14/2017 1:07 PM  Richard Barnett  has presented today for surgery, with the diagnosis of nstemi  The various methods of treatment have been discussed with the patient and family. After consideration of risks, benefits and other options for treatment, the patient has consented to  Procedure(s): LEFT HEART CATH AND CORONARY ANGIOGRAPHY (N/A) as a surgical intervention .  The patient's history has been reviewed, patient examined, no change in status, stable for surgery.  I have reviewed the patient's chart and labs.  Questions were answered to the patient's satisfaction.    Cath Lab Visit (complete for each Cath Lab visit)  Clinical Evaluation Leading to the Procedure:   ACS: Yes.    Non-ACS:    Anginal Classification: CCS IV  Anti-ischemic medical therapy: Maximal Therapy (2 or more classes of medications)  Non-Invasive Test Results: No non-invasive testing performed  Prior CABG: No previous CABG       Theron Arista [facility] 01/14/2017 1:07 PM

## 2017-01-14 NOTE — Progress Notes (Signed)
Dr. Delma Officer paged to ask if ok to draw blood from patient's foot for labs since pt is unable to have blood drawn from either arm. Awaiting call back.

## 2017-01-14 NOTE — H&P (View-Only) (Signed)
Progress Note  Patient Name: Richard Barnett Verbeke Date of Encounter: 01/14/2017  Primary Cardiologist: Dr. Ian MalkinJunagadhwalla [facility]    Subjective   Had a few more bouts of chest discomfort overnight, took a nitroglycerin.  Has headache.  States that he feels terrible.  Troponin increased to 0.22 and is currently trending downward.  Inpatient Medications    Scheduled Meds: . atorvastatin  40 mg Oral q1800  . cloNIDine  0.2 mg Oral BID  . isosorbide mononitrate  30 mg Oral Daily  .  morphine injection  2 mg Intravenous Once  . pantoprazole  20 mg Oral Daily  . sodium chloride flush  3 mL Intravenous Q12H   Continuous Infusions: . heparin 1,250 Units/hr (01/14/17 0513)   PRN Meds: acetaminophen **OR** acetaminophen, nitroGLYCERIN, promethazine   Vital Signs    Vitals:   01/13/17 2058 01/13/17 2347 01/14/17 0017 01/14/17 0513  BP: (!) 147/96 (!) 149/80 136/82 116/79  Pulse: 70   (!) 57  Resp: (!) 22     Temp: 97.6 F (36.4 C)   97.7 F (36.5 C)  TempSrc: Oral   Oral  SpO2: 100%   100%  Weight: 227 lb (103 kg)   228 lb 12.8 oz (103.8 kg)  Height: 5\' 7"  (1.702 m)       Intake/Output Summary (Last 24 hours) at 01/14/2017 0948 Last data filed at 01/14/2017 [id number] Gross per 24 hour  Intake 1000 ml  Output 450 ml  Net 550 ml   Filed Weights   01/13/17 0832 01/13/17 2058 01/14/17 0513  Weight: 230 lb (104.3 kg) 227 lb (103 kg) 228 lb 12.8 oz (103.8 kg)    Telemetry    Sinus rhythm- Personally Reviewed  ECG    Sinus rhythm, T wave inversions consistent with LVH with repolarization abnormality no significant change from prior- Personally Reviewed  Physical Exam   GEN: No acute distress.   Neck: No JVD Cardiac: RRR, no murmurs, rubs, or gallops.  Respiratory: Clear to auscultation bilaterally. GI: Soft, nontender, non-distended  MS: No edema; No deformity.  Left fistula antecubital, thrill Neuro:  Nonfocal  Psych: Normal affect   Labs      Chemistry Recent Labs  Lab 01/13/17 0837 01/14/17 0343  NA 136 136  K 3.6 3.9  CL 106 106  CO2 22 20*  GLUCOSE 119* 141*  BUN 35* 39*  CREATININE 3.41* 3.69*  CALCIUM 8.2* 8.1*  PROT  --  5.7*  ALBUMIN  --  2.9*  AST  --  17  ALT  --  14*  ALKPHOS  --  54  BILITOT  --  0.4  GFRNONAA 18* 16*  GFRAA 21* 19*  ANIONGAP 8 10     Hematology Recent Labs  Lab 01/13/17 0837 01/14/17 0343  WBC 4.6 6.0  RBC 3.74* 3.68*  HGB 10.8* 10.5*  HCT 32.8* 32.8*  MCV 87.7 89.1  MCH 28.9 28.5  MCHC 32.9 32.0  RDW 14.0 14.6  PLT 243 243    Cardiac Enzymes Recent Labs  Lab 01/13/17 1550 01/13/17 2234 01/14/17 0343  TROPONINI 0.18* 0.21* 0.15*    Recent Labs  Lab 01/13/17 0858 01/13/17 1154  TROPIPOC 0.03 0.10*     BNPNo results for input(s): BNP, PROBNP in the last 168 hours.   DDimer No results for input(s): DDIMER in the last 168 hours.   Radiology    Dg Chest 2 View  Result Date: 01/13/2017 CLINICAL DATA:  Left-sided pain and weakness EXAM:  CHEST  2 VIEW COMPARISON:  12/23/2016 FINDINGS: Cardiac shadow remains enlarged. The lungs are well aerated bilaterally. No focal infiltrate or sizable effusion is seen. No acute bony abnormality is noted. IMPRESSION: Stable cardiomegaly without acute abnormality. Electronically Signed   By: Alcide Clever M.D.   On: 01/13/2017 09:06    Cardiac Studies   ECHO 01/13/17: - Left ventricle: The cavity size was normal. Wall thickness was   increased in a pattern of mild LVH. There was mild concentric   hypertrophy. Systolic function was normal. The estimated ejection   fraction was in the range of 55% to 60%. Wall motion was normal;   there were no regional wall motion abnormalities. Features are   consistent with a pseudonormal left ventricular filling pattern,   with concomitant abnormal relaxation and increased filling   pressure (grade 2 diastolic dysfunction). - Mitral valve: There was mild regurgitation. - Left atrium: The  atrium was mildly dilated. - Tricuspid valve: There was mild-moderate regurgitation. - Pulmonary arteries: Systolic pressure was mildly increased. PA   peak pressure: 40 mm Hg (S).  Patient Profile     61 y.o. [name] with known coronary artery disease status post 2 stents placed in 2016 in [place], history of myocardial infarction, diabetes with hypertension hyperlipidemia and chronic kidney disease stage IV with AV fistula placed left arm on 10/01/16 here with both typical as well as atypical chest pain, mildly elevated troponin with subtle rise and fall and severe hypertension.  Assessment & Plan    Chest pain -Both typical as well as atypical features.  He had ongoing chest discomfort episodes overnight.  Troponin did increase to 0.22.  He has had an abnormal nuclear stress test in the past.  In the past, we have tried to avoid cardiac catheterization because of renal disease however given his recurrent episodes of discomfort and mildly elevated troponin, as well as mature fistula left arm, I think it makes sense to pursue diagnostic cardiac catheterization with dye sparing.  We discussed at length.  He understands the risks of progression to hemodialysis.  He also understands risks of stroke, heart attack, bleeding.  He is willing to proceed.  I have added him onto the board for later this afternoon.  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  I have called Dr. Hyman Hopes with nephrology to make him aware of the situation in case he does need hemodialysis new start.  We will gently hydrate him.  Since he already has essentially stage V chronic kidney disease, both of [place] do not think that bicarb will be helpful.  Elevated troponin -In the setting of chest pain, concerning for unstable angina.  I think it makes sense to check diagnostic cardiac catheterization.  Chronic kidney disease stage IV/V - I discussed with Dr. Hyman Hopes of  nephrology, no need for formal consult at this time. He is aware if HD is needed.   Has a mature fistula left antecubital fossa.  This was placed on 10/01/16.  His memory seems to be somewhat impaired as he thought that potentially his graft was placed about a month ago.  It seems from historical review that he is no longer on transplant list.  Donato Schultz, MD  For questions or updates, please contact [facility] Please consult [email or web address] for contact info under Cardiology/STEMI.      Signed, Donato Schultz, MD  01/14/2017, 9:48 AM

## 2017-01-14 NOTE — Progress Notes (Signed)
    Ost LAD lesion is 30% stenosed.  Prox LAD lesion is 30% stenosed.  Mid LAD lesion is 30% stenosed.  1st Mrg lesion is 90% stenosed.  Ost 1st Diag lesion is 75% stenosed.  Prox RCA to Mid RCA lesion is 10% stenosed.  LV end diastolic pressure is moderately elevated.   1. Left dominant circulation 2. 2 vessel obstructive CAD    - 75% small diagonal branch    - 90% first OM - this is a small branch that bifurcates in the mid vessel. There is in stent restenosis. The vessel is small < 2.25 mm and disease extends past a bifurcation 3. Moderately elevated LVEDP  Plan: I only visualize one stent in the OM.  I would recommend continued medical therapy. In my opinion he would not benefit from repeat intervention of OM since this is a very small branch and previously stented.  Other major vessels are without significant disease.  Dr. [place]  Should be able to be DC'd hopefully in AM.   Donato Schultz, MD

## 2017-01-14 NOTE — Plan of Care (Signed)
Pt c/o chest pain, has nitroglycerin as needed with relief.

## 2017-01-14 NOTE — Progress Notes (Signed)
Site area: Right Femoral Artery  Site Prior to Removal: level 0  Pressure Applied For 20 MINUTES   Minutes Beginning at 1400  Manual:yes  Patient Status During Pull: without complaints, VSS  Post Pull Groin Site: level 0  Post Pull Instructions Given:yes, verbalized understanding   Post Pull Pulses Present: yes, Right DP   Dressing Applied:Gauze and tegaderm

## 2017-01-14 NOTE — Progress Notes (Addendum)
Paged by nurse that patient is having left-sided chest pain, on evaluation he described pain as nonradiating, left-sided, sharp, associated with some dyspnea, no nausea, vomiting or diaphoresis. Patient initially refused nitroglycerin stating that it causes a headache ,after talking with him agreed to take nitroglycerin which resolved his pain. Patient appears anxious but remained hemodynamically stable. Ordered morphine 2 mg once. Repeat EKG without any acute change. Troponin trending up, second set at 0.21  Talked with cardiology fellow, who advised to keep monitoring and continue heparin gtt, as they are not keen to take him to cath lab with creatinine above 3.  Symptoms more consistent with unstable angina.

## 2017-01-14 NOTE — Progress Notes (Addendum)
 Progress Note  Patient Name: Crixus G Topham Date of Encounter: 01/14/2017  Primary Cardiologist: Dr. Junagadhwalla [facility]    Subjective   Had a few more bouts of chest discomfort overnight, took a nitroglycerin.  Has headache.  States that he feels terrible.  Troponin increased to 0.22 and is currently trending downward.  Inpatient Medications    Scheduled Meds: . atorvastatin  40 mg Oral q1800  . cloNIDine  0.2 mg Oral BID  . isosorbide mononitrate  30 mg Oral Daily  .  morphine injection  2 mg Intravenous Once  . pantoprazole  20 mg Oral Daily  . sodium chloride flush  3 mL Intravenous Q12H   Continuous Infusions: . heparin 1,250 Units/hr (01/14/17 0513)   PRN Meds: acetaminophen **OR** acetaminophen, nitroGLYCERIN, promethazine   Vital Signs    Vitals:   01/13/17 2058 01/13/17 2347 01/14/17 0017 01/14/17 0513  BP: (!) 147/96 (!) 149/80 136/82 116/79  Pulse: 70   (!) 57  Resp: (!) 22     Temp: 97.6 F (36.4 C)   97.7 F (36.5 C)  TempSrc: Oral   Oral  SpO2: 100%   100%  Weight: 227 lb (103 kg)   228 lb 12.8 oz (103.8 kg)  Height: 5' 7" (1.702 m)       Intake/Output Summary (Last 24 hours) at 01/14/2017 0948 Last data filed at 01/14/2017 0906 Gross per 24 hour  Intake 1000 ml  Output 450 ml  Net 550 ml   Filed Weights   01/13/17 0832 01/13/17 2058 01/14/17 0513  Weight: 230 lb (104.3 kg) 227 lb (103 kg) 228 lb 12.8 oz (103.8 kg)    Telemetry    Sinus rhythm- Personally Reviewed  ECG    Sinus rhythm, T wave inversions consistent with LVH with repolarization abnormality no significant change from prior- Personally Reviewed  Physical Exam   GEN: No acute distress.   Neck: No JVD Cardiac: RRR, no murmurs, rubs, or gallops.  Respiratory: Clear to auscultation bilaterally. GI: Soft, nontender, non-distended  MS: No edema; No deformity.  Left fistula antecubital, thrill Neuro:  Nonfocal  Psych: Normal affect   Labs      Chemistry Recent Labs  Lab 01/13/17 0837 01/14/17 0343  NA 136 136  K 3.6 3.9  CL 106 106  CO2 22 20*  GLUCOSE 119* 141*  BUN 35* 39*  CREATININE 3.41* 3.69*  CALCIUM 8.2* 8.1*  PROT  --  5.7*  ALBUMIN  --  2.9*  AST  --  17  ALT  --  14*  ALKPHOS  --  54  BILITOT  --  0.4  GFRNONAA 18* 16*  GFRAA 21* 19*  ANIONGAP 8 10     Hematology Recent Labs  Lab 01/13/17 0837 01/14/17 0343  WBC 4.6 6.0  RBC 3.74* 3.68*  HGB 10.8* 10.5*  HCT 32.8* 32.8*  MCV 87.7 89.1  MCH 28.9 28.5  MCHC 32.9 32.0  RDW 14.0 14.6  PLT 243 243    Cardiac Enzymes Recent Labs  Lab 01/13/17 1550 01/13/17 2234 01/14/17 0343  TROPONINI 0.18* 0.21* 0.15*    Recent Labs  Lab 01/13/17 0858 01/13/17 1154  TROPIPOC 0.03 0.10*     BNPNo results for input(s): BNP, PROBNP in the last 168 hours.   DDimer No results for input(s): DDIMER in the last 168 hours.   Radiology    Dg Chest 2 View  Result Date: 01/13/2017 CLINICAL DATA:  Left-sided pain and weakness EXAM:   CHEST  2 VIEW COMPARISON:  12/23/2016 FINDINGS: Cardiac shadow remains enlarged. The lungs are well aerated bilaterally. No focal infiltrate or sizable effusion is seen. No acute bony abnormality is noted. IMPRESSION: Stable cardiomegaly without acute abnormality. Electronically Signed   By: Alcide Clever M.D.   On: 01/13/2017 09:06    Cardiac Studies   ECHO 01/13/17: - Left ventricle: The cavity size was normal. Wall thickness was   increased in a pattern of mild LVH. There was mild concentric   hypertrophy. Systolic function was normal. The estimated ejection   fraction was in the range of 55% to 60%. Wall motion was normal;   there were no regional wall motion abnormalities. Features are   consistent with a pseudonormal left ventricular filling pattern,   with concomitant abnormal relaxation and increased filling   pressure (grade 2 diastolic dysfunction). - Mitral valve: There was mild regurgitation. - Left atrium: The  atrium was mildly dilated. - Tricuspid valve: There was mild-moderate regurgitation. - Pulmonary arteries: Systolic pressure was mildly increased. PA   peak pressure: 40 mm Hg (S).  Patient Profile     61 y.o. [name] with known coronary artery disease status post 2 stents placed in 2016 in [place], history of myocardial infarction, diabetes with hypertension hyperlipidemia and chronic kidney disease stage IV with AV fistula placed left arm on 10/01/16 here with both typical as well as atypical chest pain, mildly elevated troponin with subtle rise and fall and severe hypertension.  Assessment & Plan    Chest pain -Both typical as well as atypical features.  He had ongoing chest discomfort episodes overnight.  Troponin did increase to 0.22.  He has had an abnormal nuclear stress test in the past.  In the past, we have tried to avoid cardiac catheterization because of renal disease however given his recurrent episodes of discomfort and mildly elevated troponin, as well as mature fistula left arm, I think it makes sense to pursue diagnostic cardiac catheterization with dye sparing.  We discussed at length.  He understands the risks of progression to hemodialysis.  He also understands risks of stroke, heart attack, bleeding.  He is willing to proceed.  I have added him onto the board for later this afternoon.  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  I have called Dr. Hyman Hopes with nephrology to make him aware of the situation in case he does need hemodialysis new start.  We will gently hydrate him.  Since he already has essentially stage V chronic kidney disease, both of [place] do not think that bicarb will be helpful.  Elevated troponin -In the setting of chest pain, concerning for unstable angina.  I think it makes sense to check diagnostic cardiac catheterization.  Chronic kidney disease stage IV/V - I discussed with Dr. Hyman Hopes of  nephrology, no need for formal consult at this time. He is aware if HD is needed.   Has a mature fistula left antecubital fossa.  This was placed on 10/01/16.  His memory seems to be somewhat impaired as he thought that potentially his graft was placed about a month ago.  It seems from historical review that he is no longer on transplant list.  Donato Schultz, MD  For questions or updates, please contact [facility] Please consult [email or web address] for contact info under Cardiology/STEMI.      Signed, Donato Schultz, MD  01/14/2017, 9:48 AM

## 2017-01-14 NOTE — Progress Notes (Signed)
ANTICOAGULATION CONSULT NOTE - Follow Up Consult  Pharmacy Consult for heparin Indication: chest pain/ACS  Patient Measurements: Height: 5\' 7"  (170.2 cm) Weight: 228 lb 12.8 oz (103.8 kg) IBW/kg (Calculated) : 66.1 Heparin Dosing Weight: 89kg  Labs: Recent Labs    01/13/17 0837 01/13/17 1550 01/13/17 2234 01/14/17 0343  HGB 10.8*  --   --  10.5*  HCT 32.8*  --   --  32.8*  PLT 243  --   --  243  HEPARINUNFRC  --   --  0.44 0.48  CREATININE 3.41*  --   --  3.69*  TROPONINI  --  0.18* 0.21* 0.15*    Estimated Creatinine Clearance: 23.8 mL/min (A) (by C-G formula based on SCr of 3.69 mg/dL (H)).  Assessment: Richard Barnett is a 61 y/o M who presented with chest pain, so heparin was initiated. His EKG shows no acute changes and his troponins were mildly elevated and peaked at 0.21. He elected to undergo a cardiac cath today.  Heparin level is therapeutic for two levels (0.44 and 0.48), CBC stable, no bleeding noted.   Goal of Therapy:  Heparin level 0.3-0.7 units/ml Monitor platelets by anticoagulation protocol: Yes   Plan:  Continue heparin at 1250 units/hr Daily HL, CBC Follow anticoagulation plans post-cath   Nolen Mu PharmD PGY1 Pharmacy Practice Resident 01/14/2017 11:52 AM Pager: [phone]

## 2017-01-14 NOTE — Progress Notes (Signed)
ANTICOAGULATION CONSULT NOTE  Pharmacy Consult for Heparin Indication: chest pain/ACS  Allergies  Allergen Reactions  . Penicillins Swelling and Rash    ANGIOEDEMA "SWELLING OF ENTIRE BODY"  Has patient had a PCN reaction causing immediate rash, facial/tongue/throat swelling, SOB or lightheadedness with hypotension: No Has patient had a PCN reaction causing severe rash involving mucus membranes or skin necrosis: No Has patient had a PCN reaction that required hospitalization No Has patient had a PCN reaction occurring within the last 10 years: No If all of the above answers are "NO", then may proceed with Cephalosporin use.     Patient Measurements: Height: 5\' 7"  (170.2 cm) Weight: 227 lb (103 kg) IBW/kg (Calculated) : 66.1 Heparin Dosing Weight: 89 Kg  Vital Signs: Temp: 97.6 F (36.4 C) (11/08 2058) Temp Source: Oral (11/08 7169) BP: 147/96 (11/08 2058) Pulse Rate: 70 (11/08 2058)  Labs: Recent Labs    01/13/17 0837 01/13/17 1550 01/13/17 2234  HGB 10.8*  --   --   HCT 32.8*  --   --   PLT 243  --   --   HEPARINUNFRC  --   --  0.44  CREATININE 3.41*  --   --   TROPONINI  --  0.18* 0.21*    Estimated Creatinine Clearance: 25.7 mL/min (A) (by C-G formula based on SCr of 3.41 mg/dL (H)).   Medical History: Past Medical History:  Diagnosis Date  . Arthritis    "right leg" (08/22/2014)  . CAD (coronary artery disease)    a. CAD s/p 2 stent placement in [place] 2016. b. Neg nuc 05/2014 & 01/2016.  Marland Kitchen CKD (chronic kidney disease), stage IV (HCC)    [name] 08/22/2014  . Daily headache   . Depression    "I always get that" (08/22/2014)  . Diabetes mellitus (HCC)   . DVT (deep venous thrombosis) (HCC) ?   RLE  . GERD (gastroesophageal reflux disease)   . Heart murmur   . Hypercholesterolemia   . Hypertension   . Myocardial infarction (HCC)   . Wears glasses    Assessment: Tyrion Reece Agar Bergkamp is a 61 y.o. [place] complaining of chest pain not on anticoagulation prior  to admit. Troponin elevated to 0.21; CBC stable from prior labs; HgB 10.8  Goal of Therapy:  Heparin level 0.3-0.7 units/ml Monitor platelets by anticoagulation protocol: Yes   Plan:  Cont heparin infusion at 1250 units/hr Confirmatory anti-Xa level with AM labs Continue to monitor H&H and platelets  Abran Duke 01/14/2017,1:17 AM

## 2017-01-15 DIAGNOSIS — I25119 Atherosclerotic heart disease of native coronary artery with unspecified angina pectoris: Secondary | ICD-10-CM

## 2017-01-15 DIAGNOSIS — N185 Chronic kidney disease, stage 5: Secondary | ICD-10-CM

## 2017-01-15 DIAGNOSIS — I770 Arteriovenous fistula, acquired: Secondary | ICD-10-CM

## 2017-01-15 DIAGNOSIS — I10 Essential (primary) hypertension: Secondary | ICD-10-CM

## 2017-01-15 LAB — BASIC METABOLIC PANEL
Anion gap: 8 (ref 5–15)
BUN: 38 mg/dL — ABNORMAL HIGH (ref 6–20)
CHLORIDE: 109 mmol/L (ref 101–111)
CO2: 20 mmol/L — ABNORMAL LOW (ref 22–32)
CREATININE: 3.63 mg/dL — AB (ref 0.61–1.24)
Calcium: 8.4 mg/dL — ABNORMAL LOW (ref 8.9–10.3)
GFR calc Af Amer: 19 mL/min — ABNORMAL LOW (ref >60)
GFR calc non Af Amer: 17 mL/min — ABNORMAL LOW (ref >60)
GLUCOSE: 158 mg/dL — AB (ref 65–99)
Potassium: 5 mmol/L (ref 3.5–5.1)
SODIUM: 137 mmol/L (ref 135–145)

## 2017-01-15 LAB — GLUCOSE, CAPILLARY: GLUCOSE-CAPILLARY: 123 mg/dL — AB (ref 65–99)

## 2017-01-15 MED ORDER — CLONIDINE HCL 0.2 MG PO TABS: 0.2000 mg | ORAL_TABLET | Freq: Two times a day (BID) | ORAL | 0 refills | Status: DC

## 2017-01-15 MED ORDER — ASPIRIN EC 81 MG PO TBEC
81.0000 mg | DELAYED_RELEASE_TABLET | Freq: Every day | ORAL | Status: DC
  Administered 2017-01-15: 81 mg via ORAL
  Filled 2017-01-15: qty 1

## 2017-01-15 MED ORDER — ISOSORBIDE MONONITRATE ER 30 MG PO TB24: 30.0000 mg | ORAL_TABLET | Freq: Every day | ORAL | 0 refills | Status: DC

## 2017-01-15 MED ORDER — AMLODIPINE BESYLATE 2.5 MG PO TABS: 2.5000 mg | ORAL_TABLET | Freq: Every day | ORAL | 0 refills | Status: DC

## 2017-01-15 MED ORDER — AMLODIPINE BESYLATE 2.5 MG PO TABS
2.5000 mg | ORAL_TABLET | Freq: Every day | ORAL | Status: DC
  Administered 2017-01-15: 2.5 mg via ORAL
  Filled 2017-01-15: qty 1

## 2017-01-15 NOTE — Progress Notes (Signed)
Tech offered Pt a bath. Pt stated that he did not want a sponge bath, but prefer to get in the shower.  Tech notified RN regarding obtaining an order to discontinue from tele in order for Pt to get a shower. Tech will follow up will RN later.

## 2017-01-15 NOTE — Progress Notes (Signed)
RN notified tech regarding Pt can get in shower. Tech entered into Pt's room to let him know the shower order was approved. Pt states he wants the central line out and the IV line out before he gets in shower. Tech notified RN with Pt's concerns.

## 2017-01-15 NOTE — Progress Notes (Signed)
   Subjective:   No acute events overnight. Pt had some intermittent chest discomfort overnight- but he denied any chest pain when I saw him.  He denies headaches,nausea, vomiting, or diaphoresis. I explained better compliance with his antiHTN medicines. He has been seen by cardiology. His creatinine has been stable. Explained that he will need close follow up with his cardiologist.    Objective:  Vital signs in last 24 hours: Vitals:   01/14/17 1743 01/14/17 2106 01/15/17 0611 01/15/17 0822  BP: (!) 151/86 135/60  135/62  Pulse: 61 69 63 62  Resp: 14 16 18    Temp:  (!) 97.5 F (36.4 C) (!) 97.4 F (36.3 C) 97.6 F (36.4 C)  TempSrc:  Oral Oral Oral  SpO2: 96% 100% 100% 100%  Weight:   229 lb 4.5 oz (104 kg)   Height:       Physical Exam  Constitutional: He appears well-developed and well-nourished.  Non-toxic appearance. He does not appear ill.  HENT:  Head: Normocephalic and atraumatic.  Cardiovascular: Normal rate and regular rhythm.  Pulmonary/Chest: Effort normal and breath sounds normal. No accessory muscle usage. No respiratory distress.  Abdominal: Soft. Bowel sounds are normal. He exhibits no distension. There is no tenderness.  Neurological: He is alert.  Skin: No rash noted. He is not diaphoretic. No pallor.  Psychiatric: His behavior is normal. His mood appears not anxious. He is not agitated.   Assessment/Plan:  61 year old Richard Barnett with PMH of CKD 4, diabetes mellitus, hypertension, hyperlipidemia who presents with acute onset chest pain radiating to left arm.  Chest pain: Presented with typical and typical features. HTN from running out of his meds contributing to his chest pain and clonidine was restarted. Underwent cath yesterday and Cardiac catheterization with dye sparing from 01/14/2017 reveals two-vessel CAD with a 75% small diagonal branch and a 90% first OM stenosis with in-stent restenosis but small vessel size that is being managed medically. He received  IV fluids. THis morning he endorses intermittent chest discomfort but not when I examined him- cardiology has already evaluated him  -continue home blood pressure regimen of clonidine, imdur -appreciate cardiology recs- he may be able to be d/c later day- will check with them- creatinine still pending  -Continue pantoprazole 20 mg daily -Nitroglycerin 0.4 mg sublingual every 5 minutes as needed -Continue atorvastatin 40mg  qd  Hypertension  He has remained normotensive  -Continue clonidine 0.2 mg twice daily -Cardiology added amlodipine for antianginal agent  -continue imdur   CKD IV- not yet on dialysis. Last creatinine 3.68.   - will monitor his creatinine given he underwent cath- Dr Richard Barnett was notified- if Cr stable then dc today with close follow up in [place]   Dispo: Anticipated discharge in approximately today  Signed Richard Lever, MD IMTS 01/15/2017, 8:59 AM

## 2017-01-15 NOTE — Progress Notes (Signed)
Progress Note  Patient Name: Richard Barnett Date of Encounter: 01/15/2017  Primary Cardiologist: Dr. Therisa DoyneZahid Junagadhwalla [facility] [place])  Subjective   Reports some brief episodes of chest discomfort since yesterday.  Breathing comfortably.  No palpitations.  Inpatient Medications    Scheduled Meds: . atorvastatin  40 mg Oral q1800  . cloNIDine  0.2 mg Oral BID  . heparin  5,000 Units Subcutaneous Q8H  . isosorbide mononitrate  30 mg Oral Daily  . pantoprazole  20 mg Oral Daily  . pneumococcal 23 valent vaccine  0.5 mL Intramuscular Tomorrow-1000  . sodium chloride flush  3 mL Intravenous Q12H  . sodium chloride flush  3 mL Intravenous Q12H   Continuous Infusions: . sodium chloride     PRN Meds: sodium chloride, acetaminophen **OR** acetaminophen, nitroGLYCERIN, promethazine, sodium chloride flush   Vital Signs    Vitals:   01/14/17 1742 01/14/17 1743 01/14/17 2106 01/15/17 0611  BP: (!) 149/68 (!) 151/86 135/60   Pulse: 62 61 69 63  Resp: (!) 9 14 16 18   Temp:   (!) 97.5 F (36.4 C) (!) 97.4 F (36.3 C)  TempSrc:   Oral Oral  SpO2: 96% 96% 100% 100%  Weight:    229 lb 4.5 oz (104 kg)  Height:        Intake/Output Summary (Last 24 hours) at 01/15/2017 0753 Last data filed at 01/15/2017 0500 Gross per 24 hour  Intake 811.7 ml  Output 1580 ml  Net -768.3 ml   Filed Weights   01/13/17 2058 01/14/17 0513 01/15/17 0611  Weight: 227 lb (103 kg) 228 lb 12.8 oz (103.8 kg) 229 lb 4.5 oz (104 kg)    Telemetry    Sinus rhythm with lead motion artifact.  Personally reviewed.  ECG    Tracing from 01/14/2017 showed sinus bradycardia with LVH and left atrial enlargement.  Personally reviewed.  Physical Exam   GEN: No acute distress.   Neck: No JVD. Cardiac: RRR, soft systolic murmur, no gallop.  Respiratory: Nonlabored. Clear to auscultation bilaterally. GI: Soft, nontender, bowel sounds present. MS:  Left arm AV fistula in place.  No edema; No  deformity. Neuro:  Nonfocal. Psych: Alert and oriented x 3. Normal affect.  Labs    Chemistry Recent Labs  Lab 01/13/17 0837 01/14/17 0343 01/14/17 1601  NA 136 136  --   K 3.6 3.9  --   CL 106 106  --   CO2 22 20*  --   GLUCOSE 119* 141*  --   BUN 35* 39*  --   CREATININE 3.41* 3.69* 3.68*  CALCIUM 8.2* 8.1*  --   PROT  --  5.7*  --   ALBUMIN  --  2.9*  --   AST  --  17  --   ALT  --  14*  --   ALKPHOS  --  54  --   BILITOT  --  0.4  --   GFRNONAA 18* 16* 16*  GFRAA 21* 19* 19*  ANIONGAP 8 10  --      Hematology Recent Labs  Lab 01/13/17 0837 01/14/17 0343 01/14/17 1601  WBC 4.6 6.0 6.2  RBC 3.74* 3.68* 3.78*  HGB 10.8* 10.5* 11.1*  HCT 32.8* 32.8* 33.4*  MCV 87.7 89.1 88.4  MCH 28.9 28.5 29.4  MCHC 32.9 32.0 33.2  RDW 14.0 14.6 14.7  PLT 243 243 222    Cardiac Enzymes Recent Labs  Lab 01/13/17 1550 01/13/17 2234 01/14/17 0343 01/14/17  1126  TROPONINI 0.18* 0.21* 0.15* 0.11*    Recent Labs  Lab 01/13/17 0858 01/13/17 1154  TROPIPOC 0.03 0.10*     Radiology    Dg Chest 2 View  Result Date: 01/13/2017 CLINICAL DATA:  Left-sided pain and weakness EXAM: CHEST  2 VIEW COMPARISON:  12/23/2016 FINDINGS: Cardiac shadow remains enlarged. The lungs are well aerated bilaterally. No focal infiltrate or sizable effusion is seen. No acute bony abnormality is noted. IMPRESSION: Stable cardiomegaly without acute abnormality. Electronically Signed   By: Alcide Clever M.D.   On: 01/13/2017 09:06    Cardiac Studies   Cardiac catheterization 01/14/2017:  Ost LAD lesion is 30% stenosed.  Prox LAD lesion is 30% stenosed.  Mid LAD lesion is 30% stenosed.  1st Mrg lesion is 90% stenosed.  Ost 1st Diag lesion is 75% stenosed.  Prox RCA to Mid RCA lesion is 10% stenosed.  LV end diastolic pressure is moderately elevated.   1. Left dominant circulation 2. 2 vessel obstructive CAD    - 75% small diagonal branch    - 90% first OM - this is a small branch  that bifurcates in the mid vessel. There is in stent restenosis. The vessel is small < 2.25 mm and disease extends past a bifurcation 3. Moderately elevated LVEDP  Plan: I only visualize one stent in the OM.  I would recommend continued medical therapy. In my opinion he would not benefit from repeat intervention of OM since this is a very small branch and previously stented.  Other major vessels are without significant disease.  Echocardiogram 01/13/2017: Study Conclusions  - Left ventricle: The cavity size was normal. Wall thickness was   increased in a pattern of mild LVH. There was mild concentric   hypertrophy. Systolic function was normal. The estimated ejection   fraction was in the range of 55% to 60%. Wall motion was normal;   there were no regional wall motion abnormalities. Features are   consistent with a pseudonormal left ventricular filling pattern,   with concomitant abnormal relaxation and increased filling   pressure (grade 2 diastolic dysfunction). - Mitral valve: There was mild regurgitation. - Left atrium: The atrium was mildly dilated. - Tricuspid valve: There was mild-moderate regurgitation. - Pulmonary arteries: Systolic pressure was mildly increased. PA   peak pressure: 40 mm Hg (S).  Patient Profile     61 y.o. Richard Barnett with a history of CKD stage IV with AV fistula placed but not yet on hemodialysis, hypertension, type 2 diabetes mellitus, hyperlipidemia, and CAD with previous OM stent intervention.  He presents with chest pain and mildly elevated troponin I levels.  Cardiac catheterization from 01/14/2017 reveals two-vessel CAD with a 75% small diagonal branch and a 90% first OM stenosis with in-stent restenosis but small vessel size that is being managed medically.  Assessment & Plan    1.  Branch vessel obstructive CAD with 75% first diagonal stenosis and 90% small first OM stenosis with in-stent restenosis that is being managed medically.  No major obstructive CAD  in the larger epicardial's.  LVEF normal at 55-60%.  2.  Mild troponin I elevation in the setting of chest pain with typical and atypical features, peak troponin I 0.21.  3.  Essential hypertension.  4.  CKD stage IV/V, last creatinine 3.68.  Patient has left AV fistula in place, has not started hemodialysis as yet.  Would plan to continue medical therapy for management of CAD.  I reviewed his previous outpatient cardiology note  from [facility] in April.  At that time he was on aspirin, Norvasc, Lipitor, clonidine, Imdur, and Lopressor - most of which are not present now.  Not clear that his current blood pressure and heart rate will tolerate re-addition of all of these medications.  Would start back on aspirin 81 mg daily, continue Imdur, Lipitor, and Clonidine.  Add Norvasc 2.5 mg daily as additional antianginal.  Might be able to add low-dose Lopressor next.  Would follow-up on creatinine, if stable expect he could be discharged with close follow-up with his primary cardiologist in [facility].  Signed, Nona Dell, MD  01/15/2017, 7:53 AM

## 2017-01-15 NOTE — Discharge Instructions (Signed)
Femoral Site Care Refer to this sheet in the next few weeks. These instructions provide you with information about caring for yourself after your procedure. Your health care provider may also give you more specific instructions. Your treatment has been planned according to current medical practices, but problems sometimes occur. Call your health care provider if you have any problems or questions after your procedure. What can I expect after the procedure? After your procedure, it is typical to have the following:  Bruising at the site that usually fades within 1-2 weeks.  Blood collecting in the tissue (hematoma) that may be painful to the touch. It should usually decrease in size and tenderness within 1-2 weeks.  Follow these instructions at home:  Take medicines only as directed by your health care provider.  You may shower 24-48 hours after the procedure or as directed by your health care provider. Remove the bandage (dressing) and gently wash the site with plain soap and water. Pat the area dry with a clean towel. Do not rub the site, because this may cause bleeding.  Do not take baths, swim, or use a hot tub until your health care provider approves.  Check your insertion site every day for redness, swelling, or drainage.  Do not apply powder or lotion to the site.  Limit use of stairs to twice a day for the first 2-3 days or as directed by your health care provider.  Do not squat for the first 2-3 days or as directed by your health care provider.  Do not lift over 10 lb (4.5 kg) for 5 days after your procedure or as directed by your health care provider.  Ask your health care provider when it is okay to: ? Return to work or school. ? Resume usual physical activities or sports. ? Resume sexual activity.  Do not drive home if you are discharged the same day as the procedure. Have someone else drive you.  You may drive 24 hours after the procedure unless otherwise instructed by  your health care provider.  Do not operate machinery or power tools for 24 hours after the procedure or as directed by your health care provider.  If your procedure was done as an outpatient procedure, which means that you went home the same day as your procedure, a responsible adult should be with you for the first 24 hours after you arrive home.  Keep all follow-up visits as directed by your health care provider. This is important. Contact a health care provider if:  You have a fever.  You have chills.  You have increased bleeding from the site. Hold pressure on the site. Get help right away if:  You have unusual pain at the site.  You have redness, warmth, or swelling at the site.  You have drainage (other than a small amount of blood on the dressing) from the site.  The site is bleeding, and the bleeding does not stop after 30 minutes of holding steady pressure on the site.  Your leg or foot becomes pale, cool, tingly, or numb. This information is not intended to replace advice given to you by your health care provider. Make sure you discuss any questions you have with your health care provider. Document Released: 10/26/2013 Document Revised: 07/31/2015 Document Reviewed: 09/11/2013 Elsevier Interactive Patient Education  [employer]2018 [employer] It was a pleasure taking care of you We have changed some of your medications. Please follow up with your heart doctor within this week in [place] (  Dr Sharol Roussel) Please do not run out of your blood pressure medications- and call ahead to make sure you have the refills

## 2017-01-15 NOTE — Discharge Summary (Signed)
Name: Richard Barnett Dao MRN: [id number] DOB: 1955/12/19 61 y.o. PCP: Patient, No Pcp Per  Date of Admission: 01/13/2017  8:34 AM Date of Discharge: 01/15/2017 Attending Physician: Earl LagosNarendra, Nischal, MD  Discharge Diagnosis: 1. Coronary artery disease, chest pain with typical and atypical features , HTN Active Problems:   Chest pain   Discharge Medications: Allergies as of 01/15/2017      Reactions   Penicillins Swelling, Rash   ANGIOEDEMA "SWELLING OF ENTIRE BODY" Has patient had a PCN reaction causing immediate rash, facial/tongue/throat swelling, SOB or lightheadedness with hypotension: No Has patient had a PCN reaction causing severe rash involving mucus membranes or skin necrosis: No Has patient had a PCN reaction that required hospitalization No Has patient had a PCN reaction occurring within the last 10 years: No If all of the above answers are "NO", then may proceed with Cephalosporin use.      Medication List    STOP taking these medications   meloxicam 15 MG tablet Commonly known as:  MOBIC   metoprolol tartrate 50 MG tablet Commonly known as:  LOPRESSOR     TAKE these medications   amLODipine 2.5 MG tablet Commonly known as:  NORVASC Take 1 tablet (2.5 mg total) daily by mouth. Start taking on:  01/16/2017 What changed:    medication strength  how much to take  how to take this  when to take this  additional instructions   aspirin 81 MG EC tablet Take 1 tablet (81 mg total) by mouth daily. Swallow whole.   atorvastatin 40 MG tablet Commonly known as:  LIPITOR TAKE ONE TABLET BY MOUTH EVERY EVENING AT SIX P.M.   citalopram 40 MG tablet Commonly known as:  CELEXA Take 40 mg by mouth daily.   cloNIDine 0.2 MG tablet Commonly known as:  CATAPRES Take 1 tablet (0.2 mg total) 2 (two) times daily by mouth. What changed:    how much to take  how to take this  when to take this  additional instructions      isosorbide mononitrate 30 MG  24 hr tablet Commonly known as:  IMDUR Take 1 tablet (30 mg total) daily by mouth. What changed:  You were already taking a medication with the same name, and this prescription was added. Make sure you understand how and when to take each.   latanoprost 0.005 % ophthalmic solution Commonly known as:  XALATAN Place 1 drop into both eyes at bedtime.   nitroGLYCERIN 0.4 MG SL tablet Commonly known as:  NITROSTAT DISSOLVE 1 TABLET UNDER THE TONGUE AS NEEDED FOR CHEST PAIN. REPEAT AS NEEDED EVERY 5 MINUTES UP TO A TOTAL OF 3 DOSES   omeprazole 20 MG tablet Commonly known as:  PRILOSEC OTC Take 1 tablet (20 mg total) by mouth daily.   oxyCODONE-acetaminophen 5-325 MG tablet Commonly known as:  PERCOCET/ROXICET Take 1 tablet by mouth every 6 (six) hours as needed.   SIMBRINZA 1-0.2 % Susp Generic drug:  Brinzolamide-Brimonidine Place 1 drop into both eyes 2 (two) times daily.       Disposition and follow-up:   [facility].  At the Barnett follow up visit please address:  1.  CAD- please restart the beta blocker- we have held it on discharge because heart rate still in low 60s.  HTN- please recheck BP and ensure compliance with antiHTN CKD IV-needs follow up with renal  2.  Labs / imaging needed at time  of follow-up: BMET  3.  Pending labs/ test needing follow-up:   Follow-up Appointments: Follow-up Information    Call Lars Masson, MD.   Specialty:  Cardiology Contact information: [address] [place] [address] [phone]        [facility] [place] EMERGENCY DEPARTMENT Follow up.   Specialty:  Emergency Medicine Why:  If symptoms worsen Contact information: [address] [id number] mc [place] [address] [phone]       Your family doctor Follow up.        Richard Constable, MD. Schedule an appointment as soon as  possible for a visit in 1 week(s).   Specialty:  Cardiology Contact information: [address] [place] [address] [phone]           Barn[facility] Course by problem list: Active Problems:   Chest pain   1. Chest pain: Presented with typical and typical features. HTN from running out of his meds contributing to his chest pain and clonidine was restarted. His pain was concerning for unstable angina given his history of CAD and being off of his meds for 3 days. Trops mildly elevated at 0.21 and peaked at 0.21. Was kept on heparin drip. EKG did not show any st or t wave changes. 2d echo showed no regional wall motion abnormalities and a grade 2 diastolic dysfunction. Underwent cath yesterday and Cardiac catheterization with dye sparing from 01/14/2017 reveals two-vessel CAD with a 75% small diagonal branch and a 90% first OM stenosis with in-stent restenosis but small vessel size that is being managed medically.  2. Coronary artery disease: Underwent dye sparing cath which showed Branch vessel obstructive CAD with 75% first diagonal stenosis and 90% small first OM stenosis with in-stent restenosis that is being managed medically.  No major obstructive CAD in the larger epicardial's.  LVEF normal at 55-60%. Resumed aspirin, imdur, lipitor, and decreased amlodipine to 2.5 mg on discharge. Due to heart rate in the low 60s, have stopped the metoprolol until follow up with cardiology.    3. Hypertension: Patient presented with SBP in the 180s due to running out of his home meds which improved after restarting his home medications. Meds were resumed on discharge with the exception of the beta blocker   4. CKD IV. Has a mature fistula left antecubital fossa but not on dialysis. This was placed on 10/01/16.  His memory seems to be somewhat impaired as he thought that potentially his graft was placed about a month ago.  It seems from historical review that he is no longer on  transplant list. He received dye sparing cath, and so there was a concern for worsening renal failure, and cardiology had called nephrology who recommended observation. He was hydrated pre and post cath.  On discharge, his creatinine has remained stable      Discharge Vitals:   BP 139/74 (BP Location: Right Arm)   Pulse 64   Temp 97.6 F (36.4 C) (Oral)   Resp 16   Ht 5\' 7"  (1.702 m)   Wt 229 lb 4.5 oz (104 kg)   SpO2 100%   BMI 35.91 kg/m   Pertinent Labs, Studies, and Procedures:  Cath  1. Left dominant circulation 2. 2 vessel obstructive CAD - 75% small diagonal branch - 90% first OM - this is a small branch that bifurcates in the mid vessel. There is in stent restenosis. The vessel is small <2.25 mm and disease extends  past a bifurcation 3. Moderately elevated LVEDP    Discharge Instructions: Discharge Instructions    Diet - low sodium heart healthy   Complete by:  As directed    Increase activity slowly   Complete by:  As directed       Signed: Deneise Lever, MD 01/15/2017, 3:14 PM

## 2017-01-17 ENCOUNTER — Encounter (HOSPITAL_COMMUNITY): Payer: Self-pay | Admitting: Cardiology

## 2017-01-21 NOTE — Care Management (Addendum)
1528 01-21-17 Late Entry: CM received call from patient in regard to medications and that he needs assistance. Pt states he uses [facility]- CM did call Lequita Halt @ the pharmacy and pt had picked up medications [facility] [address]. [facility] usually make pill boxes for the patient, however pt has not been since July to get medications or to have them delivered. Pt's PCP Dr. Julio Sicks- pt states he was having difficulty making an appointment. CM did reach out to Liaison Lyn Hollingshead for [facility]- pt is active with services. CM Danise Edge to contact patient in regards to medications and assistance with PCP needs. CM did go over with Liaison medications patient has and needs. CM also called Megan at [facility] to make her aware that pt will be set up with PCP appointment and to look out for new Rx's. CM did ask patient to answer the phone when [facility] calls. Pt wanted information for housing and assistance with new glasses- maybe [employer] could be of Assistance- Chiropractor. Gala Lewandowsky, RN,BSN Case Manager [phone]

## 2017-01-27 ENCOUNTER — Encounter (HOSPITAL_COMMUNITY): Payer: Self-pay | Admitting: Emergency Medicine

## 2017-01-27 ENCOUNTER — Emergency Department (HOSPITAL_COMMUNITY): Payer: Medicaid Other

## 2017-01-27 ENCOUNTER — Inpatient Hospital Stay (HOSPITAL_COMMUNITY)
Admission: EM | Admit: 2017-01-27 | Discharge: 2017-01-29 | DRG: 281 | Disposition: A | Discharge status: Home / Self Care | Payer: Medicaid Other | Attending: Internal Medicine | Admitting: Internal Medicine

## 2017-01-27 DIAGNOSIS — Z955 Presence of coronary angioplasty implant and graft: Secondary | ICD-10-CM

## 2017-01-27 DIAGNOSIS — R51 Headache: Secondary | ICD-10-CM | POA: Diagnosis present

## 2017-01-27 DIAGNOSIS — I2510 Atherosclerotic heart disease of native coronary artery without angina pectoris: Secondary | ICD-10-CM | POA: Diagnosis not present

## 2017-01-27 DIAGNOSIS — I129 Hypertensive chronic kidney disease with stage 1 through stage 4 chronic kidney disease, or unspecified chronic kidney disease: Secondary | ICD-10-CM | POA: Diagnosis not present

## 2017-01-27 DIAGNOSIS — N179 Acute kidney failure, unspecified: Secondary | ICD-10-CM | POA: Diagnosis present

## 2017-01-27 DIAGNOSIS — F70 Mild intellectual disabilities: Secondary | ICD-10-CM

## 2017-01-27 DIAGNOSIS — R079 Chest pain, unspecified: Secondary | ICD-10-CM | POA: Diagnosis present

## 2017-01-27 DIAGNOSIS — I214 Non-ST elevation (NSTEMI) myocardial infarction: Secondary | ICD-10-CM | POA: Diagnosis not present

## 2017-01-27 DIAGNOSIS — E1122 Type 2 diabetes mellitus with diabetic chronic kidney disease: Secondary | ICD-10-CM | POA: Diagnosis present

## 2017-01-27 DIAGNOSIS — E785 Hyperlipidemia, unspecified: Secondary | ICD-10-CM | POA: Diagnosis not present

## 2017-01-27 DIAGNOSIS — N184 Chronic kidney disease, stage 4 (severe): Secondary | ICD-10-CM

## 2017-01-27 DIAGNOSIS — Z880 Allergy status to penicillin: Secondary | ICD-10-CM

## 2017-01-27 DIAGNOSIS — G4733 Obstructive sleep apnea (adult) (pediatric): Secondary | ICD-10-CM | POA: Diagnosis present

## 2017-01-27 DIAGNOSIS — I1311 Hypertensive heart and chronic kidney disease without heart failure, with stage 5 chronic kidney disease, or end stage renal disease: Secondary | ICD-10-CM | POA: Diagnosis present

## 2017-01-27 DIAGNOSIS — T82858D Stenosis of vascular prosthetic devices, implants and grafts, subsequent encounter: Secondary | ICD-10-CM

## 2017-01-27 DIAGNOSIS — I252 Old myocardial infarction: Secondary | ICD-10-CM

## 2017-01-27 DIAGNOSIS — Z79899 Other long term (current) drug therapy: Secondary | ICD-10-CM

## 2017-01-27 DIAGNOSIS — N185 Chronic kidney disease, stage 5: Secondary | ICD-10-CM | POA: Diagnosis present

## 2017-01-27 DIAGNOSIS — Y838 Other surgical procedures as the cause of abnormal reaction of the patient, or of later complication, without mention of misadventure at the time of the procedure: Secondary | ICD-10-CM | POA: Diagnosis present

## 2017-01-27 DIAGNOSIS — Z841 Family history of disorders of kidney and ureter: Secondary | ICD-10-CM

## 2017-01-27 DIAGNOSIS — Z8249 Family history of ischemic heart disease and other diseases of the circulatory system: Secondary | ICD-10-CM | POA: Diagnosis not present

## 2017-01-27 LAB — I-STAT TROPONIN, ED: Troponin i, poc: 1.84 ng/mL (ref 0.00–0.08)

## 2017-01-27 LAB — BASIC METABOLIC PANEL
ANION GAP: 9 (ref 5–15)
BUN: 35 mg/dL — ABNORMAL HIGH (ref 6–20)
CO2: 24 mmol/L (ref 22–32)
Calcium: 8.4 mg/dL — ABNORMAL LOW (ref 8.9–10.3)
Chloride: 105 mmol/L (ref 101–111)
Creatinine, Ser: 4.33 mg/dL — ABNORMAL HIGH (ref 0.61–1.24)
GFR calc Af Amer: 16 mL/min — ABNORMAL LOW (ref >60)
GFR, EST NON AFRICAN AMERICAN: 13 mL/min — AB (ref >60)
Glucose, Bld: 97 mg/dL (ref 65–99)
POTASSIUM: 3.7 mmol/L (ref 3.5–5.1)
SODIUM: 138 mmol/L (ref 135–145)

## 2017-01-27 LAB — CBC
HEMATOCRIT: 37.7 % — AB (ref 39.0–52.0)
HEMOGLOBIN: 12.3 g/dL — AB (ref 13.0–17.0)
MCH: 28.8 pg (ref 26.0–34.0)
MCHC: 32.6 g/dL (ref 30.0–36.0)
MCV: 88.3 fL (ref 78.0–100.0)
Platelets: 273 10*3/uL (ref 150–400)
RBC: 4.27 MIL/uL (ref 4.22–5.81)
RDW: 14.1 % (ref 11.5–15.5)
WBC: 5.7 10*3/uL (ref 4.0–10.5)

## 2017-01-27 LAB — GLUCOSE, CAPILLARY: Glucose-Capillary: 148 mg/dL — ABNORMAL HIGH (ref 65–99)

## 2017-01-27 MED ORDER — HEPARIN BOLUS VIA INFUSION
4000.0000 [IU] | Freq: Once | INTRAVENOUS | Status: AC
  Administered 2017-01-27: 4000 [IU] via INTRAVENOUS
  Filled 2017-01-27: qty 4000

## 2017-01-27 MED ORDER — ATORVASTATIN CALCIUM 40 MG PO TABS: 40.0000 mg | ORAL_TABLET | Freq: Every day | ORAL | Status: DC

## 2017-01-27 MED ORDER — AMLODIPINE BESYLATE 5 MG PO TABS
2.5000 mg | ORAL_TABLET | Freq: Every day | ORAL | Status: DC
  Administered 2017-01-28: 2.5 mg via ORAL
  Filled 2017-01-27: qty 1

## 2017-01-27 MED ORDER — INSULIN ASPART 100 UNIT/ML ~~LOC~~ SOLN
0.0000 [IU] | Freq: Three times a day (TID) | SUBCUTANEOUS | Status: DC
  Administered 2017-01-28 – 2017-01-29 (×3): 1 [IU] via SUBCUTANEOUS

## 2017-01-27 MED ORDER — ASPIRIN EC 81 MG PO TBEC
81.0000 mg | DELAYED_RELEASE_TABLET | Freq: Every day | ORAL | Status: DC
  Administered 2017-01-28 – 2017-01-29 (×2): 81 mg via ORAL
  Filled 2017-01-27 (×3): qty 1

## 2017-01-27 MED ORDER — BRIMONIDINE TARTRATE 0.2 % OP SOLN
1.0000 [drp] | Freq: Two times a day (BID) | OPHTHALMIC | Status: DC
  Administered 2017-01-28 – 2017-01-29 (×3): 1 [drp] via OPHTHALMIC
  Filled 2017-01-27: qty 5

## 2017-01-27 MED ORDER — HEPARIN (PORCINE) IN NACL 100-0.45 UNIT/ML-% IJ SOLN
1100.0000 [IU]/h | INTRAMUSCULAR | Status: DC
  Administered 2017-01-27 – 2017-01-28 (×2): 1100 [IU]/h via INTRAVENOUS
  Filled 2017-01-27 (×4): qty 250

## 2017-01-27 MED ORDER — CLONIDINE HCL 0.2 MG PO TABS
0.2000 mg | ORAL_TABLET | Freq: Two times a day (BID) | ORAL | Status: DC
  Administered 2017-01-27 – 2017-01-28 (×2): 0.2 mg via ORAL
  Filled 2017-01-27 (×2): qty 1

## 2017-01-27 MED ORDER — PANTOPRAZOLE SODIUM 40 MG PO TBEC
40.0000 mg | DELAYED_RELEASE_TABLET | Freq: Every day | ORAL | Status: DC
  Administered 2017-01-28 – 2017-01-29 (×2): 40 mg via ORAL
  Filled 2017-01-27 (×2): qty 1

## 2017-01-27 MED ORDER — ASPIRIN 81 MG PO CHEW: 324.0000 mg | CHEWABLE_TABLET | Freq: Once | ORAL | Status: DC

## 2017-01-27 MED ORDER — METOPROLOL TARTRATE 50 MG PO TABS
50.0000 mg | ORAL_TABLET | Freq: Two times a day (BID) | ORAL | Status: DC
  Administered 2017-01-28 – 2017-01-29 (×4): 50 mg via ORAL
  Filled 2017-01-27 (×4): qty 1

## 2017-01-27 MED ORDER — LATANOPROST 0.005 % OP SOLN
1.0000 [drp] | Freq: Every day | OPHTHALMIC | Status: DC
  Administered 2017-01-28: 1 [drp] via OPHTHALMIC
  Filled 2017-01-27: qty 2.5

## 2017-01-27 MED ORDER — BRINZOLAMIDE 1 % OP SUSP
1.0000 [drp] | Freq: Two times a day (BID) | OPHTHALMIC | Status: DC
  Administered 2017-01-28 – 2017-01-29 (×3): 1 [drp] via OPHTHALMIC
  Filled 2017-01-27: qty 10

## 2017-01-27 MED ORDER — NITROGLYCERIN 0.4 MG SL SUBL
0.4000 mg | SUBLINGUAL_TABLET | SUBLINGUAL | Status: DC | PRN
  Administered 2017-01-27: 0.4 mg via SUBLINGUAL
  Filled 2017-01-27: qty 1

## 2017-01-27 MED ORDER — ISOSORBIDE MONONITRATE ER 30 MG PO TB24
30.0000 mg | ORAL_TABLET | Freq: Every day | ORAL | Status: DC
  Administered 2017-01-28: 30 mg via ORAL
  Filled 2017-01-27: qty 1

## 2017-01-27 NOTE — ED Notes (Signed)
I stat troponin level 0.04. Results will not cross over in system. Dr. Madilyn Hook made aware.

## 2017-01-27 NOTE — Progress Notes (Addendum)
ANTICOAGULATION CONSULT NOTE - Initial Consult  Pharmacy Consult for Heparin Indication: chest pain/ACS  Allergies  Allergen Reactions  . Penicillins Swelling and Rash    ANGIOEDEMA "SWELLING OF ENTIRE BODY"  Has patient had a PCN reaction causing immediate rash, facial/tongue/throat swelling, SOB or lightheadedness with hypotension: No Has patient had a PCN reaction causing severe rash involving mucus membranes or skin necrosis: No Has patient had a PCN reaction that required hospitalization No Has patient had a PCN reaction occurring within the last 10 years: No If all of the above answers are "NO", then may proceed with Cephalosporin use.     Patient Measurements: Height: 5\' 7"  (170.2 cm) Weight: 230 lb (104.3 kg) IBW/kg (Calculated) : 66.1 Heparin Dosing Weight: 89.1 kg  Vital Signs: Temp: 98.7 F (37.1 C) (11/22 1452) Temp Source: Oral (11/22 1452) BP: 164/97 (11/22 1715) Pulse Rate: 78 (11/22 1715)  Labs: Recent Labs    01/27/17 1450  HGB 12.3*  HCT 37.7*  PLT 273  CREATININE 4.33*    Estimated Creatinine Clearance: 20.4 mL/min (A) (by C-G formula based on SCr of 4.33 mg/dL (H)).  Medical History: Past Medical History:  Diagnosis Date  . Arthritis    "right leg" (08/22/2014)  . CAD (coronary artery disease)    a. CAD s/p 2 stent placement in [place] 2016. b. Neg nuc 05/2014 & 01/2016.  Marland Kitchen CKD (chronic kidney disease), stage IV (HCC)    [name] 08/22/2014  . Daily headache   . Depression    "I always get that" (08/22/2014)  . Diabetes mellitus (HCC)   . DVT (deep venous thrombosis) (HCC) ?   RLE  . GERD (gastroesophageal reflux disease)   . Heart murmur   . Hypercholesterolemia   . Hypertension   . Myocardial infarction (HCC)   . Wears glasses    Assessment: 50 yoM with known coronary artery disease presents with central chest pain with radiation to left arm. Patient had ASA 324 in route. Pharmacy consulted to dose heparin. Baseline CBC stable.    Goal of Therapy:  Heparin level 0.3-0.7 units/ml Monitor platelets by anticoagulation protocol: Yes   Plan:  Give 4,000 units bolus x 1 Start heparin infusion at 1,100 units/hr Check anti-Xa level in 8 hours and daily while on heparin Continue to monitor H&H and platelets   Luciel Brickman L. Marcy Salvo, PharmD, MS PGY1 Pharmacy Resident [phone]

## 2017-01-27 NOTE — ED Provider Notes (Signed)
[facility] 4[facility] CV SURGICAL PROGRESSIVE CARE Provider Note   CSN: [id number] Arrival date & time: 01/27/17  1434     History   Chief Complaint Chief Complaint  Patient presents with  . Chest Pain    HPI Harvin Scheryl DarterG Sibilia is a 61 y.o. [place].  The history is provided by the patient. No language interpreter was used.  Chest Pain     Ragan Scheryl DarterG Jose is a 61 y.o. [place] who presents to the Emergency Department complaining of chest pain.  He reports severe left-sided chest pain that radiates to his left arm and jaw that started a couple hours ago.  Symptoms started while he was eating [place] dinner.  He states the food was good and he did not have any difficulty with eating.  He had mild associated shortness of breath.  No diaphoresis, nausea, vomiting.  Overall his pain is improving compared to when it began.  Pain lasted for 1-2 hours.  He states that he is taking 3 medicines but is supposed to be on 5 and he is not sure which medications he is taking and which ones he is not taking. Past Medical History:  Diagnosis Date  . Arthritis    "right leg" (08/22/2014)  . CAD (coronary artery disease)    a. CAD s/p 2 stent placement in [place] 2016. b. Neg nuc 05/2014 & 01/2016.  Marland Kitchen. CKD (chronic kidney disease), stage IV (HCC)    [name]/notes 08/22/2014  . Daily headache   . Depression    "I always get that" (08/22/2014)  . Diabetes mellitus (HCC)   . DVT (deep venous thrombosis) (HCC) ?   RLE  . GERD (gastroesophageal reflux disease)   . Heart murmur   . Hypercholesterolemia   . Hypertension   . Myocardial infarction (HCC)   . Wears glasses     Patient Active Problem List   Diagnosis Date Noted  . NSTEMI (non-ST elevated myocardial infarction) (HCC) 01/27/2017  . Diabetes mellitus (HCC)   . Anemia of chronic renal failure   . Secondary hyperparathyroidism of renal origin (HCC)   . Proteinuria   . Arthritis   . Depression   . Deep vein thrombosis (HCC)   . Myocardial infarction (HCC)   . Heart  failure, unspecified (HCC)   . Heart murmur   . Prediabetes 02/22/2015  . Chronic kidney disease, stage IV (severe) (HCC)   . Hyperlipidemia 05/22/2014  . Chest pain 05/21/2014  . Hypokalemia 05/21/2014  . CAD (coronary artery disease) 05/21/2014  . Hypertension 05/21/2014    Past Surgical History:  Procedure Laterality Date  . AV FISTULA PLACEMENT Left 10/01/2016   Procedure: INSERTION OF 4-7MM X 45CM ARTERIOVENOUS (AV) GORE-TEX GRAFT ARM;  Surgeon: Chuck Hintickson, Christopher S, MD;  [facility];  Service: Vascular;  Laterality: Left;  . CORONARY ANGIOPLASTY WITH STENT PLACEMENT     "1 + 1"  . DRAINAGE AND CLOSURE OF LYMPHOCELE Left 10/29/2016   Procedure: EVACUATION OF LYMPHOCELE LEFT ARM ARTERIOVENOUS GRAFT;  Surgeon: Chuck Hintickson, Christopher S, MD;  [facility];  Service: Vascular;  Laterality: Left;  . LEFT HEART CATH AND CORONARY ANGIOGRAPHY N/A 01/14/2017   Procedure: LEFT HEART CATH AND CORONARY ANGIOGRAPHY;  Surgeon: [place]Jordan, Peter M, MD;  [facility];  Service: Cardiovascular;  Laterality: N/A;       Home Medications    Prior to Admission medications   Medication Sig Start Date End Date Taking? Authorizing Provider  amLODipine (NORVASC) 10 MG tablet Take 10  mg by mouth daily. 11/17/15  Yes [provider]  atorvastatin (LIPITOR) 40 MG tablet TAKE ONE TABLET BY MOUTH EVERY EVENING AT SIX P.M. Patient taking differently: Take 40 mg by mouth daily at 6 PM.  11/09/16  Yes Liberty Handy, PA-C  Brinzolamide-Brimonidine [facility] 1-0.2 % SUSP Place 1 drop into both eyes 2 (two) times daily.   Yes [provider]  cloNIDine (CATAPRES) 0.2 MG tablet Take 1 tablet (0.2 mg total) 2 (two) times daily by mouth. 01/15/17  Yes Deneise Lever, MD  isosorbide mononitrate (IMDUR) 30 MG 24 hr tablet Take 1 tablet (30 mg total) daily by mouth. 01/15/17  Yes Deneise Lever, MD  latanoprost (XALATAN) 0.005 % ophthalmic solution Place 1 drop into both eyes at  bedtime.   Yes [provider]  metoprolol tartrate (LOPRESSOR) 50 MG tablet Take 50 mg by mouth 2 (two) times daily with a meal. 01/09/17  Yes [provider]  nitroGLYCERIN (NITROSTAT) 0.4 MG SL tablet DISSOLVE 1 TABLET UNDER THE TONGUE AS NEEDED FOR CHEST PAIN. REPEAT AS NEEDED EVERY 5 MINUTES UP TO A TOTAL OF 3 DOSES Patient taking differently: DISSOLVE 1 TABLET (0.4 MG) UNDER THE TONGUE AS NEEDED FOR CHEST PAIN. REPEAT AS NEEDED EVERY 5 MINUTES UP TO A TOTAL OF 3 DOSES 12/15/15  Yes Henrietta Hoover, NP  amLODipine (NORVASC) 2.5 MG tablet Take 1 tablet (2.5 mg total) daily by mouth. Patient not taking: Reported on 01/27/2017 01/16/17   Deneise Lever, MD  aspirin 81 MG EC tablet Take 1 tablet (81 mg total) by mouth daily. Swallow whole. Patient not taking: Reported on 01/27/2017 02/10/16   Massie Maroon, FNP  omeprazole (PRILOSEC OTC) 20 MG tablet Take 1 tablet (20 mg total) by mouth daily. Patient not taking: Reported on 01/27/2017 08/11/16 08/11/17  Thomasene Lot, MD  oxyCODONE-acetaminophen (PERCOCET/ROXICET) 5-325 MG tablet Take 1 tablet by mouth every 6 (six) hours as needed. Patient not taking: Reported on 01/27/2017 10/29/16   Lars Mage, PA-C    Family History Family History  Problem Relation Age of Onset  . Hypertension Mother   . Kidney disease Mother   . Heart disease Mother   . Heart disease Father   . Hypertension Father     Social History Social History   Tobacco Use  . Smoking status: Former Smoker    Packs/day: 0.00    Years: 40.00    Pack years: 0.00    Types: Cigarettes    Last attempt to quit: 08/29/2010    Years since quitting: 6.4  . Smokeless tobacco: Never Used  . Tobacco comment: "quit smoking cigarettes in  2011" 10/27/16  Substance Use Topics  . Alcohol use: No    Comment: 10/27/16 "stopped in ~ 2011"  . Drug use: No    Comment: 10/27/16 "stopped in ~ 2011; all types of drugs""     Allergies   Penicillins   Review of  Systems Review of Systems  Cardiovascular: Positive for chest pain.  All other systems reviewed and are negative.    Physical Exam Updated Vital Signs BP (!) 148/97   Pulse 82   Temp 98.4 F (36.9 C) (Oral)   Resp 14   Ht 5\' 7"  (1.702 m)   Wt 103.2 kg (227 lb 8 oz)   SpO2 100%   BMI 35.63 kg/m   Physical Exam  Constitutional: He is oriented to person, place, and time. He appears well-developed and well-nourished.  HENT:  Head: Normocephalic and  atraumatic.  Cardiovascular: Normal rate and regular rhythm.  No murmur heard. Pulmonary/Chest: Effort normal and breath sounds normal. No respiratory distress.  Abdominal: Soft. There is no tenderness. There is no rebound and no guarding.  Musculoskeletal: He exhibits no tenderness.  Trace pitting edema to BLE.  Fistula in LUE.  Neurological: He is alert and oriented to person, place, and time.  Skin: Skin is warm and dry.  Psychiatric: He has a normal mood and affect. His behavior is normal.  Nursing note and vitals reviewed.    ED Treatments / Results  Labs (all labs ordered are listed, but only abnormal results are displayed) Labs Reviewed  BASIC METABOLIC PANEL - Abnormal; Notable for the following components:      Result Value   BUN 35 (*)    Creatinine, Ser 4.33 (*)    Calcium 8.4 (*)    GFR calc non Af Amer 13 (*)    GFR calc Af Amer 16 (*)    All other components within normal limits  CBC - Abnormal; Notable for the following components:   Hemoglobin 12.3 (*)    HCT 37.7 (*)    All other components within normal limits  GLUCOSE, CAPILLARY - Abnormal; Notable for the following components:   Glucose-Capillary 148 (*)    All other components within normal limits  I-STAT TROPONIN, ED - Abnormal; Notable for the following components:   Troponin i, poc 1.84 (*)    All other components within normal limits  HEPARIN LEVEL (UNFRACTIONATED)  CBC  HEPARIN LEVEL (UNFRACTIONATED)  BASIC METABOLIC PANEL  SODIUM, URINE,  RANDOM  CREATININE, URINE, RANDOM  TROPONIN I  TROPONIN I  TROPONIN I  HEMOGLOBIN A1C  URINALYSIS, ROUTINE W REFLEX MICROSCOPIC  I-STAT TROPONIN, ED    EKG  EKG Interpretation  Date/Time:  Thursday January 27 2017 18:41:54 EST Ventricular Rate:  73 PR Interval:    QRS Duration: 77 QT Interval:  403 QTC Calculation: 445 R Axis:   22 Text Interpretation:  Sinus rhythm Probable left atrial enlargement Left ventricular hypertrophy Anterior Q waves, possibly due to LVH Nonspecific T abnormalities, inferior leads Confirmed by Tilden Fossa [phone]) on 01/27/2017 6:48:27 PM       Radiology Dg Chest 2 View  Result Date: 01/27/2017 CLINICAL DATA:  Chest pain. EXAM: CHEST  2 VIEW COMPARISON:  Chest x-ray dated January 13, 2017. FINDINGS: Stable cardiomegaly. Normal pulmonary vascularity. No focal consolidation, pleural effusion, or pneumothorax. Minimal bibasilar atelectasis. No acute osseous abnormality. IMPRESSION: Stable cardiomegaly.  No active cardiopulmonary disease. Electronically Signed   By: Obie Dredge M.D.   On: 01/27/2017 15:42    Procedures Procedures (including critical care time) CRITICAL CARE Performed by: Tilden Fossa   Total critical care time: 35 minutes  Critical care time was exclusive of separately billable procedures and treating other patients.  Critical care was necessary to treat or prevent imminent or life-threatening deterioration.  Critical care was time spent personally by me on the following activities: development of treatment plan with patient and/or surrogate as well as nursing, discussions with consultants, evaluation of patient's response to treatment, examination of patient, obtaining history from patient or surrogate, ordering and performing treatments and interventions, ordering and review of laboratory studies, ordering and review of radiographic studies, pulse oximetry and re-evaluation of patient's condition.  Medications Ordered  in ED Medications  heparin ADULT infusion 100 units/mL (25000 units/250mL sodium chloride 0.45%) (1,100 Units/hr Intravenous New Bag/Given 01/27/17 2335)  aspirin EC tablet 81 mg (not administered)  pantoprazole (PROTONIX) EC tablet 40 mg (not administered)  amLODipine (NORVASC) tablet 2.5 mg (not administered)  atorvastatin (LIPITOR) tablet 40 mg (not administered)  cloNIDine (CATAPRES) tablet 0.2 mg (0.2 mg Oral Given 01/27/17 2354)  isosorbide mononitrate (IMDUR) 24 hr tablet 30 mg (not administered)  latanoprost (XALATAN) 0.005 % ophthalmic solution 1 drop (not administered)  brinzolamide (AZOPT) 1 % ophthalmic suspension 1 drop (not administered)  brimonidine (ALPHAGAN) 0.2 % ophthalmic solution 1 drop (not administered)  metoprolol tartrate (LOPRESSOR) tablet 50 mg (not administered)  nitroGLYCERIN (NITROSTAT) SL tablet 0.4 mg (0.4 mg Sublingual Given 01/27/17 2358)  insulin aspart (novoLOG) injection 0-9 Units (not administered)  acetaminophen (TYLENOL) tablet 650 mg (650 mg Oral Given 01/28/17 0021)  heparin bolus via infusion 4,000 Units (4,000 Units Intravenous Bolus from Bag 01/27/17 2334)     Initial Impression / Assessment and Plan / ED Course  I have reviewed the triage vital signs and the nursing notes.  Pertinent labs & imaging results that were available during my care of the patient were reviewed by me and considered in my medical decision making (see chart for details).     Patient with history of CKD and coronary artery disease here for evaluation of chest pain.  Overall his chest pain has resolved in the department.  EKG with ischemic changes but similar to priors in the past.  Initial troponin is mildly elevated.  BMP demonstrates acute on chronic renal insufficiency.  Patient remained pain-free throughout his ED stay but repeat troponin was markedly elevated compared to prior at 1.8.  Heparin drip was initiated and medicine was consulted for admission.  There is  concerned that he is not compliant with his medications due to poor resources and misunderstanding.  Final Clinical Impressions(s) / ED Diagnoses   Final diagnoses:  NSTEMI (non-ST elevated myocardial infarction) [facility])    ED Discharge Orders    None       Tilden Fossa, MD 01/28/17 0023

## 2017-01-27 NOTE — ED Notes (Signed)
Social work not available today

## 2017-01-27 NOTE — H&P (Signed)
Date: 01/27/2017               Patient Name:  Richard Barnett MRN: [id number]  DOB: 11-May-1955 Age / Sex: 61 y.o., [name]   PCP: Patient, No Pcp Per         Medical Service: Internal Medicine Teaching Service         Attending Physician: Dr. Doneen Poisson, MD    First Contact: Dr. Delma Officer Pager: [phone]  Second Contact: Dr. Johnny Bridge Pager: [phone]       After Hours (After 5p/  First Contact Pager: [phone]  weekends / holidays): Second Contact Pager: [phone]   Chief Complaint: chest pain  History of Present Illness: [name] is a 61yo [place] with PMH of CAD s/p 2 stents in 2016, found to have obstructive CAD in Nov 2018 that is being medically managed, HTN, T2DM, CKD IV, HLD, and mild intellectual impairment who presents with chest pain.  He was recently admitted to our service from 11/8-11/10 with chest pain concerning for unstable angina. Underwent dye-sparing LHC which showed two-vessel CAD with 75% first diagonal stenosis and 90% first OM stenosis with in-stent restenosis. Cardiology did not feel he would benefit from repeat intervention of OM since it is a very small branch and was previously stented, so he was discharged with medical management, including aspirin, imdur, lipitor, and amlodipine 2.5mg . Metoprolol was discontinued until outpatient follow up with cardiology due to heart rate in the low 60s.  Since discharge, he states that he has had a few episodes of transient chest pain that resolve after resting. He has not tried NTG for relief. Today, he states that at around 2 or 3pm, he came home after eating a meal and developed left-sided "heavy" chest pain that radiated to his neck and arm. Associated with numbness in his neck and arm, as well as SOB and nausea. Denies emesis or diaphoresis. He states the chest pain persisted for 3-4 hours, at which point he called EMS. Did not try anything for relief of the pain today. He reports that this pain feels similar to the chest  pain he had from his recent admission. Denies current chest pain, fevers, chills, abdominal pain, nausea/vomiting (aside from this acute episode), or leg swelling.  He states that he has been taking 3 medications since discharge. He cannot remember which ones. He states he has tried to call different pharmacies to pick up his new medications but cannot seem to figure out which pharmacy he is supposed to go to, thus he has not been taking his new medications.  Denies smoking, alcohol use, or other illicit drugs. Lives at home alone. His family is in [place]. Has not seen his PCP or cardiologist since discharge. Last saw his PCP a year ago.  ED Course: - BP 169/94, HR 72, temp 98.7, O2 100% on RA - Troponin 1.84, Cr 4.33 (baseline ~3.2-3.7) - EKG with sinus rhythm, probable LA enlargement, LVH, anterior Q waves. T wave inversions in leads V4-6, similar to prior. CXR with no active cardiopulmonary disease. - Started on heparin infusion. - Cardiology was consulted by EDP and will evaluate him tomorrow.  Meds:  Current Meds  Medication Sig  . amLODipine (NORVASC) 10 MG tablet Take 10 mg by mouth daily.  Marland Kitchen atorvastatin (LIPITOR) 40 MG tablet TAKE ONE TABLET BY MOUTH EVERY EVENING AT SIX P.M. (Patient taking differently: Take 40 mg by mouth daily at 6 PM. )  . Brinzolamide-Brimonidine (SIMBRINZA) 1-0.2 % SUSP  Place 1 drop into both eyes 2 (two) times daily.  . cloNIDine (CATAPRES) 0.2 MG tablet Take 1 tablet (0.2 mg total) 2 (two) times daily by mouth.  . isosorbide mononitrate (IMDUR) 30 MG 24 hr tablet Take 1 tablet (30 mg total) daily by mouth.  . latanoprost (XALATAN) 0.005 % ophthalmic solution Place 1 drop into both eyes at bedtime.  . metoprolol tartrate (LOPRESSOR) 50 MG tablet Take 50 mg by mouth 2 (two) times daily with a meal.  . nitroGLYCERIN (NITROSTAT) 0.4 MG SL tablet DISSOLVE 1 TABLET UNDER THE TONGUE AS NEEDED FOR CHEST PAIN. REPEAT AS NEEDED EVERY 5 MINUTES UP TO A TOTAL OF 3 DOSES  (Patient taking differently: DISSOLVE 1 TABLET (0.4 MG) UNDER THE TONGUE AS NEEDED FOR CHEST PAIN. REPEAT AS NEEDED EVERY 5 MINUTES UP TO A TOTAL OF 3 DOSES)   Allergies: Allergies as of 01/27/2017 - Review Complete 01/27/2017  Allergen Reaction Noted  . Penicillins Swelling and Rash 05/21/2014   Past Medical History:  Diagnosis Date  . Arthritis    "right leg" (08/22/2014)  . CAD (coronary artery disease)    a. CAD s/p 2 stent placement in [place] 2016. b. Neg nuc 05/2014 & 01/2016.  Marland Kitchen CKD (chronic kidney disease), stage IV (HCC)    [name] 08/22/2014  . Daily headache   . Depression    "I always get that" (08/22/2014)  . Diabetes mellitus (HCC)   . DVT (deep venous thrombosis) (HCC) ?   RLE  . GERD (gastroesophageal reflux disease)   . Heart murmur   . Hypercholesterolemia   . Hypertension   . Myocardial infarction (HCC)   . Wears glasses     Family History:  - He thinks his father may have had an MI in his 4s. - He thinks his grandmother and grandfather may have had heart attacks but is not certain - HTN in multiple family members - Mother with kidney disease  Social History:  - Denies smoking, alcohol use, or other illicit drugs. - Lives at home alone. The rest of his family is in [place].  Review of Systems: - Change in taste and right hand itching for the last few weeks - DOE, orthopnea, and PND for the last few months - Intermittent headaches for the last few weeks A complete ROS was negative except as per HPI.  Physical Exam: Blood pressure (!) 164/99, pulse 77, temperature 98.7 F (37.1 C), temperature source Oral, resp. rate (!) 21, height 5\' 7"  (1.702 m), weight 230 lb (104.3 kg), SpO2 100 %. GEN: Very pleasant [place] lying in bed in NAD. Alert and oriented x1 (likely baseline 2/2 mild intellectual impairment). HENT: [place]/AT. Moist mucous membranes. No visible lesions. EYES: PERRL. Sclera non-icteric. Conjunctiva clear. RESP: Clear to auscultation bilaterally.  No wheezes, rales, or rhonchi. No increased work of breathing. CV: Normal rate and regular rhythm. No murmurs, gallops, or rubs. 1+ pitting BLE edema. ABD: Soft. Non-tender. Non-distended. Normoactive bowel sounds. EXT: 1+ BLE edema. Warm. 2+ DP and radial pulses. Fistula in left antecubital fossa with palpable thrill. NEURO: Cranial nerves II-XII grossly intact. 5/5 strength and normal sensation in BUE. Able to lift all four extremities against gravity. No apparent audiovisual hallucinations. Speech fluent and appropriate. PSYCH: Patient is calm and pleasant. Appropriate affect. Well-groomed; speech is appropriate and on-subject.  Labs CBC Latest Ref Rng & Units 01/27/2017 01/14/2017 01/14/2017  WBC 4.0 - 10.5 K/uL 5.7 6.2 6.0  Hemoglobin 13.0 - 17.0 g/dL 12.3(L) 11.1(L) 10.5(L)  Hematocrit 39.0 - 52.0 % 37.7(L) 33.4(L) 32.8(L)  Platelets 150 - 400 K/uL 273 222 243   CMP Latest Ref Rng & Units 01/27/2017 01/15/2017 01/14/2017  Glucose 65 - 99 mg/dL 97 161(W158(H) -  BUN 6 - 20 mg/dL 96(E35(H) 45(W38(H) -  Creatinine 0.61 - 1.24 mg/dL 0.98(J4.33(H) 1.91(Y3.63(H) 7.82(N3.68(H)  Sodium 135 - 145 mmol/L 138 137 -  Potassium 3.5 - 5.1 mmol/L 3.7 5.0 -  Chloride 101 - 111 mmol/L 105 109 -  CO2 22 - 32 mmol/L 24 20(L) -  Calcium 8.9 - 10.3 mg/dL 5.6(O8.4(L) 1.3(Y8.4(L) -  Total Protein 6.5 - 8.1 g/dL - - -  Total Bilirubin 0.3 - 1.2 mg/dL - - -  Alkaline Phos 38 - 126 U/L - - -  AST 15 - 41 U/L - - -  ALT 17 - 63 U/L - - -   Troponin 1.84  EKG: Sinus rhythm, probable LA enlargement, LVH, anterior Q waves. T wave inversions in leads V4-6, similar to prior. No ST elevation.  CXR: Stable cardiomegaly. No active cardiopulmonary disease  Assessment & Plan by Problem: Active Problems:   Chest pain  Chest pain, concerning for NSTEMI In the setting of known obstructive [facility]. Discharged with medical management at last admission 2 weeks ago, including aspirin, imdur, lipitor, and amlodipine 2.5mg .  Metoprolol held due to low HR at discharge. Had recurrent chest pain today. Troponin 1.84. Cardiology consulted by EDP and will see him tomorrow. Medication compliance may be an issue. - aspirin 324mg  now, then aspirin 81mg  daily - heparin infusion - SL NTG PRN - Trend troponins - Repeat EKG in AM - Social work consult - Cardiology to evaluate in AM  AKI on CKD Cr 4.33 today (baseline ~3.2-3.7). No uremic symptoms. Patient has a mature fistula in left antecubital fossa placed in July 2018, but not currently on dialysis. Uncertain of the etiology of his acute kidney injury. He is not on any nephrotoxic medications and is still urinating without problems. He does not seem volume overloaded on exam, but does have Grade 2 DD on previous echo with LVEF 55-60%, so this could be cardiorenal in etiology. He also does not appear dry on exam and seems to have been tolerating PO intake okay prior to admission, which makes dehydration less likely as the etiology as well. - Urine sodium and creatinine - Daily weights - Strict I/O - UA - BMET in AM - Nephrology in AM  HTN HLD Grade 2 DD on echo 11/8 BP elevated in the ED. 160s/90s. It is unclear which medications patient was taking at home, but he likely was not taking all of his medications due to issues obtaining the new medications from the pharmacy. Will restart his medications from prior discharge. - isosorbide mononitrate 30mg  daily, metoprolol 50mg  BID, amlodipine 2.5mg  daily, clonidine 0.2mg  BID - continue atorvastatin 40mg  QHS - Daily weights - Strict I/Os  T2DM A1c in Nov 2017 was 6.4. Not on any home medications. - CBG - Hemoglobin A1c - SSI-S  Diet: HH. NPO at midnight VTE PPx: Heparin infusion Code: Full Dispo: Admit patient to Observation with expected length of stay less than 2 midnights.  Signed: Scherrie GerlachHuang, Whitley Patchen, MD 01/27/2017, 8:27 PM

## 2017-01-27 NOTE — ED Notes (Signed)
Care handoff to Scott, RN 

## 2017-01-27 NOTE — ED Notes (Signed)
Repeat EKG given to Dr. Madilyn Hook

## 2017-01-27 NOTE — ED Triage Notes (Signed)
PT reports central chest pain with radiation to left arm. This started one hour ago after eating lunch. PT endorses SOB and nausea with pain. PT has extensive cardiac history. PT had dialysis fistula placed 1 month ago, but has not started dialysis yet. PT had 324 ASA in route.

## 2017-01-27 NOTE — ED Notes (Signed)
Got patient undress on the monitor did ekg shown to Dr Darrin Luis

## 2017-01-28 ENCOUNTER — Encounter (HOSPITAL_COMMUNITY): Payer: Self-pay | Admitting: General Practice

## 2017-01-28 ENCOUNTER — Other Ambulatory Visit: Payer: Self-pay

## 2017-01-28 DIAGNOSIS — E785 Hyperlipidemia, unspecified: Secondary | ICD-10-CM | POA: Diagnosis present

## 2017-01-28 DIAGNOSIS — Z8249 Family history of ischemic heart disease and other diseases of the circulatory system: Secondary | ICD-10-CM | POA: Diagnosis not present

## 2017-01-28 DIAGNOSIS — N179 Acute kidney failure, unspecified: Secondary | ICD-10-CM | POA: Diagnosis present

## 2017-01-28 DIAGNOSIS — I214 Non-ST elevation (NSTEMI) myocardial infarction: Principal | ICD-10-CM

## 2017-01-28 DIAGNOSIS — Z955 Presence of coronary angioplasty implant and graft: Secondary | ICD-10-CM | POA: Diagnosis not present

## 2017-01-28 DIAGNOSIS — R7989 Other specified abnormal findings of blood chemistry: Secondary | ICD-10-CM | POA: Diagnosis not present

## 2017-01-28 DIAGNOSIS — T82858D Stenosis of vascular prosthetic devices, implants and grafts, subsequent encounter: Secondary | ICD-10-CM | POA: Diagnosis not present

## 2017-01-28 DIAGNOSIS — Z841 Family history of disorders of kidney and ureter: Secondary | ICD-10-CM | POA: Diagnosis not present

## 2017-01-28 DIAGNOSIS — G4733 Obstructive sleep apnea (adult) (pediatric): Secondary | ICD-10-CM | POA: Diagnosis present

## 2017-01-28 DIAGNOSIS — I129 Hypertensive chronic kidney disease with stage 1 through stage 4 chronic kidney disease, or unspecified chronic kidney disease: Secondary | ICD-10-CM | POA: Diagnosis not present

## 2017-01-28 DIAGNOSIS — Y838 Other surgical procedures as the cause of abnormal reaction of the patient, or of later complication, without mention of misadventure at the time of the procedure: Secondary | ICD-10-CM | POA: Diagnosis present

## 2017-01-28 DIAGNOSIS — I1311 Hypertensive heart and chronic kidney disease without heart failure, with stage 5 chronic kidney disease, or end stage renal disease: Secondary | ICD-10-CM | POA: Diagnosis present

## 2017-01-28 DIAGNOSIS — I222 Subsequent non-ST elevation (NSTEMI) myocardial infarction: Secondary | ICD-10-CM | POA: Diagnosis not present

## 2017-01-28 DIAGNOSIS — Z79899 Other long term (current) drug therapy: Secondary | ICD-10-CM | POA: Diagnosis not present

## 2017-01-28 DIAGNOSIS — I2510 Atherosclerotic heart disease of native coronary artery without angina pectoris: Secondary | ICD-10-CM | POA: Diagnosis present

## 2017-01-28 DIAGNOSIS — Z7982 Long term (current) use of aspirin: Secondary | ICD-10-CM

## 2017-01-28 DIAGNOSIS — E1122 Type 2 diabetes mellitus with diabetic chronic kidney disease: Secondary | ICD-10-CM | POA: Diagnosis present

## 2017-01-28 DIAGNOSIS — N184 Chronic kidney disease, stage 4 (severe): Secondary | ICD-10-CM | POA: Diagnosis not present

## 2017-01-28 DIAGNOSIS — Z880 Allergy status to penicillin: Secondary | ICD-10-CM | POA: Diagnosis not present

## 2017-01-28 DIAGNOSIS — I252 Old myocardial infarction: Secondary | ICD-10-CM | POA: Diagnosis not present

## 2017-01-28 DIAGNOSIS — R51 Headache: Secondary | ICD-10-CM | POA: Diagnosis present

## 2017-01-28 DIAGNOSIS — N185 Chronic kidney disease, stage 5: Secondary | ICD-10-CM | POA: Diagnosis present

## 2017-01-28 LAB — CBC
HCT: 34.9 % — ABNORMAL LOW (ref 39.0–52.0)
Hemoglobin: 11.3 g/dL — ABNORMAL LOW (ref 13.0–17.0)
MCH: 28.4 pg (ref 26.0–34.0)
MCHC: 32.4 g/dL (ref 30.0–36.0)
MCV: 87.7 fL (ref 78.0–100.0)
PLATELETS: 238 10*3/uL (ref 150–400)
RBC: 3.98 MIL/uL — ABNORMAL LOW (ref 4.22–5.81)
RDW: 13.8 % (ref 11.5–15.5)
WBC: 5.5 10*3/uL (ref 4.0–10.5)

## 2017-01-28 LAB — BASIC METABOLIC PANEL
ANION GAP: 8 (ref 5–15)
BUN: 35 mg/dL — AB (ref 6–20)
CHLORIDE: 108 mmol/L (ref 101–111)
CO2: 21 mmol/L — AB (ref 22–32)
Calcium: 7.9 mg/dL — ABNORMAL LOW (ref 8.9–10.3)
Creatinine, Ser: 3.87 mg/dL — ABNORMAL HIGH (ref 0.61–1.24)
GFR calc Af Amer: 18 mL/min — ABNORMAL LOW (ref >60)
GFR, EST NON AFRICAN AMERICAN: 15 mL/min — AB (ref >60)
GLUCOSE: 114 mg/dL — AB (ref 65–99)
POTASSIUM: 3.9 mmol/L (ref 3.5–5.1)
Sodium: 137 mmol/L (ref 135–145)

## 2017-01-28 LAB — URINALYSIS, ROUTINE W REFLEX MICROSCOPIC
BILIRUBIN URINE: NEGATIVE
Glucose, UA: 50 mg/dL — AB
Ketones, ur: NEGATIVE mg/dL
Leukocytes, UA: NEGATIVE
Nitrite: NEGATIVE
PROTEIN: 100 mg/dL — AB
Specific Gravity, Urine: 1.01 (ref 1.005–1.030)
Squamous Epithelial / LPF: NONE SEEN
pH: 6 (ref 5.0–8.0)

## 2017-01-28 LAB — TROPONIN I
TROPONIN I: 2.31 ng/mL — AB (ref <0.03)
TROPONIN I: 3.04 ng/mL — AB (ref <0.03)
TROPONIN I: 3.11 ng/mL — AB (ref <0.03)

## 2017-01-28 LAB — POCT I-STAT TROPONIN I: Troponin i, poc: 0.04 ng/mL (ref 0.00–0.08)

## 2017-01-28 LAB — SODIUM, URINE, RANDOM: Sodium, Ur: 88 mmol/L

## 2017-01-28 LAB — GLUCOSE, CAPILLARY
Glucose-Capillary: 123 mg/dL — ABNORMAL HIGH (ref 65–99)
Glucose-Capillary: 105 mg/dL — ABNORMAL HIGH (ref 65–99)
Glucose-Capillary: 139 mg/dL — ABNORMAL HIGH (ref 65–99)
Glucose-Capillary: 139 mg/dL — ABNORMAL HIGH (ref 65–99)

## 2017-01-28 LAB — HEMOGLOBIN A1C
Hgb A1c MFr Bld: 6.6 % — ABNORMAL HIGH (ref 4.8–5.6)
MEAN PLASMA GLUCOSE: 142.72 mg/dL

## 2017-01-28 LAB — HEPARIN LEVEL (UNFRACTIONATED)
HEPARIN UNFRACTIONATED: 1.7 [IU]/mL — AB (ref 0.30–0.70)
Heparin Unfractionated: 0.51 IU/mL (ref 0.30–0.70)

## 2017-01-28 LAB — CREATININE, URINE, RANDOM: Creatinine, Urine: 55.37 mg/dL

## 2017-01-28 MED ORDER — ISOSORBIDE MONONITRATE ER 60 MG PO TB24
60.0000 mg | ORAL_TABLET | Freq: Every day | ORAL | Status: DC
  Administered 2017-01-29: 60 mg via ORAL
  Filled 2017-01-28: qty 1

## 2017-01-28 MED ORDER — ACETAMINOPHEN 325 MG PO TABS
650.0000 mg | ORAL_TABLET | Freq: Four times a day (QID) | ORAL | Status: DC | PRN
  Administered 2017-01-28 – 2017-01-29 (×2): 650 mg via ORAL
  Filled 2017-01-28 (×2): qty 2

## 2017-01-28 MED ORDER — CLOPIDOGREL BISULFATE 75 MG PO TABS
600.0000 mg | ORAL_TABLET | Freq: Once | ORAL | Status: AC
  Administered 2017-01-28: 600 mg via ORAL
  Filled 2017-01-28: qty 8

## 2017-01-28 MED ORDER — AMLODIPINE BESYLATE 5 MG PO TABS
2.5000 mg | ORAL_TABLET | Freq: Once | ORAL | Status: AC
  Administered 2017-01-28: 2.5 mg via ORAL
  Filled 2017-01-28: qty 1

## 2017-01-28 MED ORDER — AMLODIPINE BESYLATE 5 MG PO TABS
5.0000 mg | ORAL_TABLET | Freq: Every day | ORAL | Status: DC
  Administered 2017-01-29: 5 mg via ORAL
  Filled 2017-01-28: qty 1

## 2017-01-28 MED ORDER — ATORVASTATIN CALCIUM 80 MG PO TABS
80.0000 mg | ORAL_TABLET | Freq: Every day | ORAL | Status: DC
  Administered 2017-01-28 – 2017-01-29 (×2): 80 mg via ORAL
  Filled 2017-01-28 (×2): qty 1

## 2017-01-28 MED ORDER — ISOSORBIDE MONONITRATE ER 30 MG PO TB24
30.0000 mg | ORAL_TABLET | Freq: Once | ORAL | Status: AC
  Administered 2017-01-28: 30 mg via ORAL
  Filled 2017-01-28: qty 1

## 2017-01-28 MED ORDER — CLOPIDOGREL BISULFATE 75 MG PO TABS
75.0000 mg | ORAL_TABLET | Freq: Every day | ORAL | Status: DC
  Administered 2017-01-29: 75 mg via ORAL
  Filled 2017-01-28: qty 1

## 2017-01-28 NOTE — Care Management Note (Addendum)
Case Management Note Donn Pierini RN, BSN Unit 4E-Case Manager [phone]  Patient Details  Name: Richard Barnett MRN: [id number] Date of Birth: 1955/12/15  Subjective/Objective:    Pt presented with chest pain               Action/Plan: PTA pt lived at home- active with [facility] for [facility]- and also has [facility] community CM- Danise Edge that follows pt. Referral received regarding pt's medications- spoke with pt at bedside who states that he is unsure about his meds and where is get them- does not think that [facility] can deliver anymore- Call made to [facility]- spoke with Darl Pikes- confirmed that they can still provide pt with delivery of medications and pill boxes if needed. They have been doing this in past but last time was in July as pt has not been going to his PCP for follow up and his PCP will not refill his medications until seen in office. PCP- is Osei Bonsu at the [facility].. On last admission- there [facility]- which [facility] per [facility] (call was made to transfer scripts so that they could deliver and [facility] told them pt had picked up scripts) Vincente Poli is open today 11/23 however can not do delivery until Mon. 11/26- if pt is discharged prior to that then would need to [facility] the [facility] to [facility]-   Expected Discharge Date:                  Expected Discharge Plan:  Home w Home Health Services  In-House Referral:  Clinical Social Work  Discharge planning Services  Medication Assistance, CM Consult  Post Acute Care Choice:    Choice offered to:     DME Arranged:    DME Agency:     HH Arranged:    HH Agency:     Status of Service:  In process, will continue to follow  If discussed at Long Length of Stay Meetings, dates discussed:    Additional Comments:  Darrold Span, RN 01/28/2017, 11:09 AM

## 2017-01-28 NOTE — H&P (Signed)
Patient refusing to complete medical history at this time

## 2017-01-28 NOTE — Progress Notes (Signed)
CSW acknowledge consult for patients prescription needs. CSW has relayed consult to [facility] as she will be the one that is able to assist patient with consult. CSW signing off as patient does no have social work needs.  Marrianne Mood, MSW,  [employer] [phone]

## 2017-01-28 NOTE — Progress Notes (Signed)
ANTICOAGULATION CONSULT NOTE  Pharmacy Consult for Heparin Indication: chest pain/ACS  Allergies  Allergen Reactions  . Penicillins Swelling and Rash    ANGIOEDEMA "SWELLING OF ENTIRE BODY"  Has patient had a PCN reaction causing immediate rash, facial/tongue/throat swelling, SOB or lightheadedness with hypotension: No Has patient had a PCN reaction causing severe rash involving mucus membranes or skin necrosis: No Has patient had a PCN reaction that required hospitalization No Has patient had a PCN reaction occurring within the last 10 years: No If all of the above answers are "NO", then may proceed with Cephalosporin use.     Patient Measurements: Height: 5\' 7"  (170.2 cm) Weight: 227 lb 8 oz (103.2 kg) IBW/kg (Calculated) : 66.1 Heparin Dosing Weight: 89.1 kg  Vital Signs: Temp: 98.4 F (36.9 C) (11/22 2133) Temp Source: Oral (11/22 2133) BP: 148/97 (11/23 0004) Pulse Rate: 82 (11/23 0004)  Labs: Recent Labs    01/27/17 1450 01/28/17 0049 01/28/17 0232  HGB 12.3*  --  11.3*  HCT 37.7*  --  34.9*  PLT 273  --  238  HEPARINUNFRC  --   --  1.70*  CREATININE 4.33*  --  3.87*  TROPONINI  --  3.11* 3.04*    Estimated Creatinine Clearance: 22.6 mL/min (A) (by C-G formula based on SCr of 3.87 mg/dL (H)).   Assessment: 80 yoM with known coronary artery disease presents with central chest pain with radiation to left arm. Patient had ASA 324 in route. Pharmacy consulted to dose heparin drip.   Heparin level is supratherapeutic at 1.7 however heparin drip was started 4.5 hr late therefore the level is inaccurate.  Goal of Therapy:  Heparin level 0.3-0.7 units/ml Monitor platelets by anticoagulation protocol: Yes   Plan:  Continue heparin drip at 1100 units/hr Heparin level at 09:00   Loura Back, PharmD, [facility] Clinical Pharmacist Main pharmacy - [phone] 01/28/2017 3:26 AM

## 2017-01-28 NOTE — Plan of Care (Signed)
  Cardiac: Ability to achieve and maintain adequate cardiovascular perfusion will improve 01/28/2017 0401 - Progressing by Ruel Favors, RN

## 2017-01-28 NOTE — Consult Note (Signed)
Cardiology Consultation:   Patient ID: Richard Barnett; [id number]; 1955-06-08   Admit date: 01/27/2017 Date of Consult: 01/28/2017  Primary Care Provider: Onsu Ponsu Primary Cardiologist: Dr. Ian Malkin cardiology [facility] Primary Electrophysiologist:  N/A   Patient Profile:   Richard Barnett is a 61 y.o. Levii with a hx of CAD, HTN, HLD, DM II, and CKD stage IV/V who is being seen today for the evaluation of NSTEMI at the request of Dr. Lorenso Courier.  History of Present Illness:   Mr. [name] with 61 year old African-American [name] with past medical history of HTN, HLD, DM II, CKD stage IV/V, and CAD. Despite the significant renal insufficiency, patient has not started on dialysis yet. He is being followed as outpatient by [facility] cardiology. He reportedly had a abnormal Myoview on 05/10/2016, however no intervention was done due to concern of contrast risk with renal insufficiency. Most recently patient presented on 01/13/2017 with chest pain. He did have elevated blood pressure and mild troponin elevation. Echocardiogram obtained on 01/13/2017 showed EF 55-60%, mild LVH, grade 2 DD, mild MR, mild-to-moderate TR, peak PA pressure 40 mmHg. He eventually underwent diagnostic cardiac catheterization by Dr. Swaziland on 01/14/2017 this showed nonobstructive 30% ostial to mid LAD disease, 90% OM1 lesion, 75% ostial D1 lesion, 10% proximal to mid RCA lesion. He was placed on low-dose amlodipine and Imdur and subsequently discharged.  Unfortunately, according to the patient, he was unable to pick up the medication after discharge. However per case manager, he did pick up the 3 medications since last discharge. Since discharge, he continued to have intermittent chest discomfort. Eventually, it culminated in the left-sided chest pain radiating down the left arm that lasted 4-5 hours yesterday. This prompted the patient to seek medical attention at [facility] again. Significant laboratory finding  include creatinine of 4.33. Troponin of 3.1. Hemoglobin A1c 6.6. EKG showed inferolateral T-wave inversion. He has not had any further chest pain since. Cardiology has been consulted for elevated troponin.    Past Medical History:  Diagnosis Date  . Arthritis    "right leg" (08/22/2014)  . CAD (coronary artery disease)    a. CAD s/p 2 stent placement in [place] 2016. b. Neg nuc 05/2014 & 01/2016.  Marland Kitchen Chest pain 01/2017  . CKD (chronic kidney disease), stage IV (HCC)    [name] 08/22/2014  . Daily headache   . Depression    "I always get that" (08/22/2014)  . Diabetes mellitus (HCC)   . DVT (deep venous thrombosis) (HCC) ?   RLE  . GERD (gastroesophageal reflux disease)   . Heart murmur   . Hypercholesterolemia   . Hypertension   . Myocardial infarction (HCC)   . Wears glasses     Past Surgical History:  Procedure Laterality Date  . AV FISTULA PLACEMENT Left 10/01/2016   Procedure: INSERTION OF 4-7MM X 45CM ARTERIOVENOUS (AV) GORE-TEX GRAFT ARM;  Surgeon: Chuck Hint, MD;  Location: [facility] OR;  Service: Vascular;  Laterality: Left;  . CORONARY ANGIOPLASTY WITH STENT PLACEMENT     "1 + 1"  . DRAINAGE AND CLOSURE OF LYMPHOCELE Left 10/29/2016   Procedure: EVACUATION OF LYMPHOCELE LEFT ARM ARTERIOVENOUS GRAFT;  Surgeon: Chuck Hint, MD;  Location: [facility] OR;  Service: Vascular;  Laterality: Left;  . LEFT HEART CATH AND CORONARY ANGIOGRAPHY N/A 01/14/2017   Procedure: LEFT HEART CATH AND CORONARY ANGIOGRAPHY;  Surgeon: Swaziland, Peter M, MD;  Location: [facility] INVASIVE CV LAB;  Service: Cardiovascular;  Laterality: N/A;  Home Medications:  Prior to Admission medications   Medication Sig Start Date End Date Taking? Authorizing Provider  amLODipine (NORVASC) 10 MG tablet Take 10 mg by mouth daily. 11/17/15  Yes [provider]  atorvastatin (LIPITOR) 40 MG tablet TAKE ONE TABLET BY MOUTH EVERY EVENING AT SIX P.M. Patient taking differently: Take 40 mg by mouth daily at 6  PM.  11/09/16  Yes Liberty Handy, PA-C  Brinzolamide-Brimonidine [facility]) 1-0.2 % SUSP Place 1 drop into both eyes 2 (two) times daily.   Yes [provider]  cloNIDine (CATAPRES) 0.2 MG tablet Take 1 tablet (0.2 mg total) 2 (two) times daily by mouth. 01/15/17  Yes Deneise Lever, MD  isosorbide mononitrate (IMDUR) 30 MG 24 hr tablet Take 1 tablet (30 mg total) daily by mouth. 01/15/17  Yes Deneise Lever, MD  latanoprost (XALATAN) 0.005 % ophthalmic solution Place 1 drop into both eyes at bedtime.   Yes [provider]  metoprolol tartrate (LOPRESSOR) 50 MG tablet Take 50 mg by mouth 2 (two) times daily with a meal. 01/09/17  Yes [provider]  nitroGLYCERIN (NITROSTAT) 0.4 MG SL tablet DISSOLVE 1 TABLET UNDER THE TONGUE AS NEEDED FOR CHEST PAIN. REPEAT AS NEEDED EVERY 5 MINUTES UP TO A TOTAL OF 3 DOSES Patient taking differently: DISSOLVE 1 TABLET (0.4 MG) UNDER THE TONGUE AS NEEDED FOR CHEST PAIN. REPEAT AS NEEDED EVERY 5 MINUTES UP TO A TOTAL OF 3 DOSES 12/15/15  Yes Henrietta Hoover, NP  amLODipine (NORVASC) 2.5 MG tablet Take 1 tablet (2.5 mg total) daily by mouth. Patient not taking: Reported on 01/27/2017 01/16/17   Deneise Lever, MD  aspirin 81 MG EC tablet Take 1 tablet (81 mg total) by mouth daily. Swallow whole. Patient not taking: Reported on 01/27/2017 02/10/16   Massie Maroon, FNP  omeprazole (PRILOSEC OTC) 20 MG tablet Take 1 tablet (20 mg total) by mouth daily. Patient not taking: Reported on 01/27/2017 08/11/16 08/11/17  Thomasene Lot, MD  oxyCODONE-acetaminophen (PERCOCET/ROXICET) 5-325 MG tablet Take 1 tablet by mouth every 6 (six) hours as needed. Patient not taking: Reported on 01/27/2017 10/29/16   Lars Mage, PA-C    Inpatient Medications: Scheduled Meds: . amLODipine  2.5 mg Oral Daily  . aspirin EC  81 mg Oral Daily  . atorvastatin  40 mg Oral q1800  . brimonidine  1 drop Both Eyes BID  . brinzolamide  1 drop Both Eyes BID  .  cloNIDine  0.2 mg Oral BID  . insulin aspart  0-9 Units Subcutaneous TID WC  . isosorbide mononitrate  30 mg Oral Daily  . latanoprost  1 drop Both Eyes QHS  . metoprolol tartrate  50 mg Oral BID WC  . pantoprazole  40 mg Oral Daily   Continuous Infusions: . heparin 1,100 Units/hr (01/28/17 0400)   PRN Meds: acetaminophen, nitroGLYCERIN  Allergies:    Allergies  Allergen Reactions  . Penicillins Swelling and Rash    ANGIOEDEMA "SWELLING OF ENTIRE BODY"  Has patient had a PCN reaction causing immediate rash, facial/tongue/throat swelling, SOB or lightheadedness with hypotension: No Has patient had a PCN reaction causing severe rash involving mucus membranes or skin necrosis: No Has patient had a PCN reaction that required hospitalization No Has patient had a PCN reaction occurring within the last 10 years: No If all of the above answers are "NO", then may proceed with Cephalosporin use.     Social History:   Social History   Socioeconomic History  .  Marital status: Divorced    Spouse name: Not on file  . Number of children: Not on file  . Years of education: Not on file  . Highest education level: Not on file  Social Needs  . Financial resource strain: Not on file  . Food insecurity - worry: Not on file  . Food insecurity - inability: Not on file  . Transportation needs - medical: Not on file  . Transportation needs - non-medical: Not on file  Occupational History  . Not on file  Tobacco Use  . Smoking status: Former Smoker    Packs/day: 0.00    Years: 40.00    Pack years: 0.00    Types: Cigarettes    Last attempt to quit: 08/29/2010    Years since quitting: 6.4  . Smokeless tobacco: Never Used  . Tobacco comment: "quit smoking cigarettes in  2011" 10/27/16  Substance and Sexual Activity  . Alcohol use: No    Comment: 10/27/16 "stopped in ~ 2011"  . Drug use: No    Comment: 10/27/16 "stopped in ~ 2011; all types of drugs""  . Sexual activity: No  Other Topics  Concern  . Not on file  Social History Narrative  . Not on file    Family History:    Family History  Problem Relation Age of Onset  . Hypertension Mother   . Kidney disease Mother   . Heart disease Mother   . Heart disease Father   . Hypertension Father      ROS:  Please see the history of present illness.  ROS  All other ROS reviewed and negative.     Physical Exam/Data:   Vitals:   01/28/17 0630 01/28/17 0758 01/28/17 0759 01/28/17 0916  BP: (!) 156/97  (!) 166/90 (!) 167/92  Pulse: 64  64   Resp: 15  13   Temp: 97.6 F (36.4 C) 99 F (37.2 C)    TempSrc: Oral Oral    SpO2: 98%  99%   Weight:      Height:        Intake/Output Summary (Last 24 hours) at 01/28/2017 1057 Last data filed at 01/28/2017 0900 Gross per 24 hour  Intake 49.5 ml  Output 1325 ml  Net -1275.5 ml   Filed Weights   01/27/17 1453 01/27/17 2133  Weight: 230 lb (104.3 kg) 227 lb 8 oz (103.2 kg)   Body mass index is 35.63 kg/m.  General:  Well nourished, well developed, in no acute distress HEENT: normal Lymph: no adenopathy Neck: no JVD Endocrine:  No thryomegaly Vascular: No carotid bruits; FA pulses 2+ bilaterally without bruits  Cardiac:  normal S1, S2; RRR; no murmur  Lungs:  clear to auscultation bilaterally, no wheezing, rhonchi or rales  Abd: soft, nontender, no hepatomegaly  Ext: no edema Musculoskeletal:  No deformities, BUE and BLE strength normal and equal Skin: warm and dry  Neuro:  CNs 2-12 intact, no focal abnormalities noted Psych:  Normal affect   EKG:  The EKG was personally reviewed and demonstrates:  Normal sinus rhythm with T wave inversion in inferolateral leads  Telemetry:  Telemetry was personally reviewed and demonstrates:  NSR without significant ventricular ectopy  Relevant CV Studies:  Echo 01/13/2017 LV EF: 55% -   60%  Study Conclusions  - Left ventricle: The cavity size was normal. Wall thickness was   increased in a pattern of mild LVH.  There was mild concentric   hypertrophy. Systolic function was normal. The estimated  ejection   fraction was in the range of 55% to 60%. Wall motion was normal;   there were no regional wall motion abnormalities. Features are   consistent with a pseudonormal left ventricular filling pattern,   with concomitant abnormal relaxation and increased filling   pressure (grade 2 diastolic dysfunction). - Mitral valve: There was mild regurgitation. - Left atrium: The atrium was mildly dilated. - Tricuspid valve: There was mild-moderate regurgitation. - Pulmonary arteries: Systolic pressure was mildly increased. PA   peak pressure: 40 mm Hg (S).   Cath 01/14/2017 Conclusion     Ost LAD lesion is 30% stenosed.  Prox LAD lesion is 30% stenosed.  Mid LAD lesion is 30% stenosed.  1st Mrg lesion is 90% stenosed.  Ost 1st Diag lesion is 75% stenosed.  Prox RCA to Mid RCA lesion is 10% stenosed.  LV end diastolic pressure is moderately elevated.   1. Left dominant circulation 2. 2 vessel obstructive CAD    - 75% small diagonal branch    - 90% first OM - this is a small branch that bifurcates in the mid vessel. There is in stent restenosis. The vessel is small < 2.25 mm and disease extends past a bifurcation 3. Moderately elevated LVEDP  Plan: I only visualize one stent in the OM.  I would recommend continued medical therapy. In my opinion he would not benefit from repeat intervention of OM since this is a very small branch and previously stented.  Other major vessels are without significant disease.     Laboratory Data:  Chemistry Recent Labs  Lab 01/27/17 1450 01/28/17 0232  NA 138 137  K 3.7 3.9  CL 105 108  CO2 24 21*  GLUCOSE 97 114*  BUN 35* 35*  CREATININE 4.33* 3.87*  CALCIUM 8.4* 7.9*  GFRNONAA 13* 15*  GFRAA 16* 18*  ANIONGAP 9 8    No results for input(s): PROT, ALBUMIN, AST, ALT, ALKPHOS, BILITOT in the last 168 hours. Hematology Recent Labs  Lab  01/27/17 1450 01/28/17 0232  WBC 5.7 5.5  RBC 4.27 3.98*  HGB 12.3* 11.3*  HCT 37.7* 34.9*  MCV 88.3 87.7  MCH 28.8 28.4  MCHC 32.6 32.4  RDW 14.1 13.8  PLT 273 238   Cardiac Enzymes Recent Labs  Lab 01/28/17 0049 01/28/17 0232 01/28/17 0945  TROPONINI 3.11* 3.04* 2.31*    Recent Labs  Lab 01/27/17 1501 01/27/17 1828  TROPIPOC 0.04 1.84*    BNPNo results for input(s): BNP, PROBNP in the last 168 hours.  DDimer No results for input(s): DDIMER in the last 168 hours.  Radiology/Studies:  Dg Chest 2 View  Result Date: 01/27/2017 CLINICAL DATA:  Chest pain. EXAM: CHEST  2 VIEW COMPARISON:  Chest x-ray dated January 13, 2017. FINDINGS: Stable cardiomegaly. Normal pulmonary vascularity. No focal consolidation, pleural effusion, or pneumothorax. Minimal bibasilar atelectasis. No acute osseous abnormality. IMPRESSION: Stable cardiomegaly.  No active cardiopulmonary disease. Electronically Signed   By: Obie Dredge M.D.   On: 01/27/2017 15:42    Assessment and Plan:   1. NSTEMI: reviewed his recent cath report, small OM1 bifurcation lesion not amenable for PCI, new TWI in lateral leads with persistent TWI in inferior leads. Currently chest pain free. Plan to increase imdur to 60mg  daily. Consider stopping clonidine, and maximize amlodipine for antianginal purpose.   2. CAD: mild disease in major coronary vessels, 90% OM1 and 75% ost diag  3. HTN: BP elevated, consider stopping clonidine and maximize on Imdur, BB and  amlodipine.   4. HLD: continue lipitor 40mg  daily. Last LDL in June 2018 was 77, goal < 70, increase lipitor to 80mg  daily.   5. DM II: on insulin  6. CKD stage IV/V: I have discussed with the patient with regard to potential contrast at risk with PCI   For questions or updates, please contact [facility] Please consult [email or web address] for contact info under Cardiology/STEMI.   Ramond Dial, [place]  01/28/2017 10:57 AM   I have personally seen and  examined this patient with Azalee Course, PA. I agree with the assessment and plan as outlined above. He is known to have CAD with severe disease in the small caliber OM branch. Cath was two weeks ago. Major epicardial vessels are patent. No options for PCI of OM branch. Plan was for medical therapy but he did not fill his medications. Now admitted with chest pain and elevated troponin. He is blaming this on the pharmacy. He is now chest pain free and troponin is trending down. My exam shows:   EKG with sinus, TWI in the inferolateral leads-reviewed by me Tele: sinus-reviewed by me  General: Well developed, well nourished, NAD  HEENT: OP clear, mucus membranes moist  SKIN: warm, dry. No rashes. Neuro: No focal deficits  Musculoskeletal: Muscle strength 5/5 all ext  Psychiatric: Mood and affect normal  Neck: No JVD, no carotid bruits, no thyromegaly, no lymphadenopathy.  Lungs:Clear bilaterally, no wheezes, rhonci, crackles Cardiovascular: Regular rate and rhythm. No murmurs, gallops or rubs. Abdomen:Soft. Bowel sounds present. Non-tender.  Extremities: No lower extremity edema. Pulses are 2 + in the bilateral DP/PT.  Will plan DAPT with ASA and Plavix given acute presentation with ACS. No need for repeat cath today. Will resume beta blocker, Imdur, Norvasc. Continue statin. I would continue heparin drip until tomorrow.   Verne Carrow 01/28/2017 12:16 PM

## 2017-01-28 NOTE — Progress Notes (Signed)
Bladder scan performed per order. Volume less than 61ml pot void.   Versie Starks, RN

## 2017-01-28 NOTE — Progress Notes (Signed)
CSW spoke with pt at bedside to provide ALF resources as requested. CSW left number and name so that pt's daughter could reach back out to CSW as needed. At this time there are no further CSW intervention as CSW is unable to assist with ALF placement. CSW sigining off.      Claude Manges Danilo Cappiello, MSW, LCSW-A Emergency Department Clinical Social Worker [phone]

## 2017-01-28 NOTE — Progress Notes (Signed)
CRITICAL VALUE ALERT  Critical Value:  Troponin 3.11  Date & Time Notied:  01/28/17; 0049   Provider Notified: Scherrie Gerlach  Orders Received/Actions taken: New orders received

## 2017-01-28 NOTE — Progress Notes (Signed)
   Subjective: Richard Barnett was seen laying in his bed this morning and states he was doing well. He states that he continues to have some mild amount of chest pain in his anterior chest, but no shortness of breath.   Objective:  Vital signs in last 24 hours: Vitals:   01/28/17 0004 01/28/17 0630 01/28/17 0758 01/28/17 0759  BP: (!) 148/97 (!) 156/97  (!) 166/90  Pulse: 82 64  64  Resp: 14 15  13   Temp:  97.6 F (36.4 C) 99 F (37.2 C)   TempSrc:  Oral Oral   SpO2: 100% 98%  99%  Weight:      Height:       Physical Exam  Constitutional: He appears well-developed and well-nourished.  Non-toxic appearance. He does not appear ill.  HENT:  Head: Normocephalic and atraumatic.  Cardiovascular: Normal rate, regular rhythm and normal pulses.  No chest wall tenderness  Pulmonary/Chest: Effort normal and breath sounds normal. No accessory muscle usage. No respiratory distress.  Musculoskeletal:       Right lower leg: He exhibits edema (1+ pitting).  Neurological: He is alert.  Psychiatric: He has a normal mood and affect. His behavior is normal.    Assessment/Plan:  Chest pain possibly NSTEMI The patient presented with chest pain described as heavy that radiated to his neck and arm and not relieved by nitroglycerin. The patient has a heart score of 7 (history,ekg with non-specific repolarization disturbance, age 62-64, >3 risk factors, initial troponin>3x normal limit) places the patient at a 50-65% risk of major adverse cardiac event.   -Appreciate cardiology recommendations: Stop clonidine, maximize on Imdur, beta-blocker, and amlodipine.  Increase Imdur to 60 mg daily.  Opted out of doing PCI due to potential contrast risk from CKD 4/5.  Increase Lipitor to 80 mg daily from 40 mg daily. -Troponins 3.11, 3.04 -Repeat EKG in morning showed left ventricular hypertrophy, peaked T waves in V5 and V6 -social work consult to look for other assisted living facilities per daughter's request  for better medication compliance -aspirin 81 mg -Pantoprazole 40 mg daily -Continuous cardiac monitoring -Increased Lipitor to 80 mg daily -Plavix 75mg  added  -Continue heparin drip till tomorrow 01/29/17  Acute on chronic kidney injury Patient's creatinine today 01/28/2017 was 3.87 which is decreased from prior 4.33 yesterday.  -Urine creatinine = 55, urine sodium = 88.  The patient's FeNa= 4.5% which places him the the post obstructive category. He has put out -900.73ml since admission. Will do a bladder scan and continue to monitor I/O  HTN The patient's blood pressure during the past 24 hrs has ranged 148-199/97-104.  -Increased Imdur to 60 mg daily starting 01/29/2017 -Continue metoprolol 50 mg twice daily -Stopped clonidine -Continue amlodipine 2.5 mg today 01/28/2017 and start amlodipine 5 mg tomorrow 01/29/17.  Diabetes Mellitus type 2 -SSI-sensitive   Dispo: Anticipated discharge in approximately 1-2 day.   Richard Courier, MD Internal Medicine PGY1 Pager:[phone] 01/28/2017, 8:39 AM

## 2017-01-28 NOTE — Progress Notes (Signed)
ANTICOAGULATION CONSULT NOTE - Follow Up Consult  Pharmacy Consult for Heparin Indication: chest pain/ACS  Allergies  Allergen Reactions  . Penicillins Swelling and Rash    ANGIOEDEMA "SWELLING OF ENTIRE BODY"  Has patient had a PCN reaction causing immediate rash, facial/tongue/throat swelling, SOB or lightheadedness with hypotension: No Has patient had a PCN reaction causing severe rash involving mucus membranes or skin necrosis: No Has patient had a PCN reaction that required hospitalization No Has patient had a PCN reaction occurring within the last 10 years: No If all of the above answers are "NO", then may proceed with Cephalosporin use.     Patient Measurements: Height: 5\' 7"  (170.2 cm) Weight: 227 lb 8 oz (103.2 kg) IBW/kg (Calculated) : 66.1 Heparin Dosing Weight:    Vital Signs: Temp: 99 F (37.2 C) (11/23 0758) Temp Source: Oral (11/23 0758) BP: 123/82 (11/23 1152) Pulse Rate: 64 (11/23 0759)  Labs: Recent Labs    01/27/17 1450 01/28/17 0049 01/28/17 0232 01/28/17 0945 01/28/17 1213  HGB 12.3*  --  11.3*  --   --   HCT 37.7*  --  34.9*  --   --   PLT 273  --  238  --   --   HEPARINUNFRC  --   --  1.70* >2.20* 0.51  CREATININE 4.33*  --  3.87*  --   --   TROPONINI  --  3.11* 3.04* 2.31*  --     Estimated Creatinine Clearance: 22.6 mL/min (A) (by C-G formula based on SCr of 3.87 mg/dL (H)).   Assessment:  Anticoag: chest pain/ACS - HL 1.7 drawn 2.5 hrs after bolus. Repeat HL >2.2. Stat repeat 0.51 in goal. CBc stable  Goal of Therapy:  Heparin level 0.3-0.7 units/ml Monitor platelets by anticoagulation protocol: Yes   Plan:  Continue IV heparin 1100 units/hr Daily HL and CBC  Nikoleta Dady S. Merilynn Finland, PharmD, BCPS Clinical Staff Pharmacist Pager [phone]  Misty Stanley Stillinger 01/28/2017,1:21 PM

## 2017-01-29 DIAGNOSIS — G4733 Obstructive sleep apnea (adult) (pediatric): Secondary | ICD-10-CM

## 2017-01-29 DIAGNOSIS — R7989 Other specified abnormal findings of blood chemistry: Secondary | ICD-10-CM

## 2017-01-29 LAB — BASIC METABOLIC PANEL
ANION GAP: 8 (ref 5–15)
BUN: 37 mg/dL — ABNORMAL HIGH (ref 6–20)
CALCIUM: 8.4 mg/dL — AB (ref 8.9–10.3)
CO2: 20 mmol/L — AB (ref 22–32)
Chloride: 107 mmol/L (ref 101–111)
Creatinine, Ser: 4.2 mg/dL — ABNORMAL HIGH (ref 0.61–1.24)
GFR, EST AFRICAN AMERICAN: 16 mL/min — AB (ref >60)
GFR, EST NON AFRICAN AMERICAN: 14 mL/min — AB (ref >60)
GLUCOSE: 119 mg/dL — AB (ref 65–99)
POTASSIUM: 4.5 mmol/L (ref 3.5–5.1)
Sodium: 135 mmol/L (ref 135–145)

## 2017-01-29 LAB — CBC
HCT: 36 % — ABNORMAL LOW (ref 39.0–52.0)
Hemoglobin: 11.7 g/dL — ABNORMAL LOW (ref 13.0–17.0)
MCH: 28.5 pg (ref 26.0–34.0)
MCHC: 32.5 g/dL (ref 30.0–36.0)
MCV: 87.8 fL (ref 78.0–100.0)
PLATELETS: 254 10*3/uL (ref 150–400)
RBC: 4.1 MIL/uL — ABNORMAL LOW (ref 4.22–5.81)
RDW: 14.1 % (ref 11.5–15.5)
WBC: 5.8 10*3/uL (ref 4.0–10.5)

## 2017-01-29 LAB — GLUCOSE, CAPILLARY
GLUCOSE-CAPILLARY: 103 mg/dL — AB (ref 65–99)
Glucose-Capillary: 137 mg/dL — ABNORMAL HIGH (ref 65–99)
Glucose-Capillary: 105 mg/dL — ABNORMAL HIGH (ref 65–99)

## 2017-01-29 LAB — HEPARIN LEVEL (UNFRACTIONATED): Heparin Unfractionated: 0.42 IU/mL (ref 0.30–0.70)

## 2017-01-29 MED ORDER — ISOSORBIDE MONONITRATE ER 30 MG PO TB24: 30.0000 mg | ORAL_TABLET | Freq: Every day | ORAL | 0 refills | Status: DC

## 2017-01-29 MED ORDER — CLOPIDOGREL BISULFATE 75 MG PO TABS: 75.0000 mg | ORAL_TABLET | Freq: Every day | ORAL | 0 refills | Status: AC

## 2017-01-29 MED ORDER — CLOPIDOGREL BISULFATE 75 MG PO TABS: 75.0000 mg | ORAL_TABLET | Freq: Every day | ORAL | 0 refills | Status: DC

## 2017-01-29 MED ORDER — AMLODIPINE BESYLATE 10 MG PO TABS: 10.0000 mg | ORAL_TABLET | Freq: Every day | ORAL | 0 refills | Status: DC

## 2017-01-29 MED ORDER — HEPARIN (PORCINE) IN NACL 100-0.45 UNIT/ML-% IJ SOLN: 1100.0000 [IU]/h | INTRAMUSCULAR | Status: DC

## 2017-01-29 MED ORDER — ISOSORBIDE MONONITRATE ER 30 MG PO TB24: 30.0000 mg | ORAL_TABLET | Freq: Every day | ORAL | Status: DC

## 2017-01-29 MED ORDER — ATORVASTATIN CALCIUM 80 MG PO TABS
80.0000 mg | ORAL_TABLET | Freq: Every day | ORAL | 0 refills | Status: DC
Start: 2017-01-29 — End: 2017-01-29

## 2017-01-29 MED ORDER — METOPROLOL TARTRATE 50 MG PO TABS: 50.0000 mg | ORAL_TABLET | Freq: Two times a day (BID) | ORAL | 0 refills | Status: DC

## 2017-01-29 MED ORDER — ATORVASTATIN CALCIUM 80 MG PO TABS: 80.0000 mg | ORAL_TABLET | Freq: Every day | ORAL | 0 refills | Status: AC

## 2017-01-29 MED ORDER — AMLODIPINE BESYLATE 10 MG PO TABS: 10.0000 mg | ORAL_TABLET | Freq: Every day | ORAL | Status: DC

## 2017-01-29 NOTE — Discharge Instructions (Signed)
It was a pleasure to take care of you Richard Barnett.  Please make the following changes to your medication:  -Start taking amlodipine 10 mg once daily -Start taking Plavix 75 mg daily  -Start taking atorvastatin 80 mg daily -Continue metoprolol 50 mg twice daily -Continue Imdur 30 mg -Stopped taking clonidine  Please call 911 immediately if you have any chest pain.    Acute Coronary Syndrome Acute coronary syndrome (ACS) is a serious problem in which there is suddenly not enough blood and oxygen supplied to the heart. ACS may mean that one or more of the blood vessels in your heart (coronary arteries) may be blocked. ACS can result in chest pain or a heart attack (myocardial infarction or MI). What are the causes? This condition is caused by atherosclerosis, which is the buildup of fat and cholesterol (plaque) on the inside of the arteries. Over time, the plaque may narrow or block the artery, and this will lessen blood flow to the heart. Plaque can also become weak and break off within a coronary artery to form a clot and cause a sudden blockage. What increases the risk? The risk factors of this condition include:  High cholesterol levels.  High blood pressure (hypertension).  Smoking.  Diabetes.  Age.  Family history of chest pain, heart disease, or stroke.  Lack of exercise.  What are the signs or symptoms? The most common signs of this condition include:  Chest pain, which can be: ? A crushing or squeezing in the chest. ? A tightness, pressure, fullness, or heaviness in the chest. ? Present for more than a few minutes, or it can stop and recur.  Pain in the arms, neck, jaw, or back.  Unexplained heartburn or indigestion.  Shortness of breath.  Nausea.  Sudden cold sweats.  Feeling light-headed or dizzy.  Sometimes, this condition has no symptoms. How is this diagnosed? ACS may be diagnosed through the following tests:  Electrocardiogram (ECG).  Blood  tests.  Coronary angiogram. This is a procedure to look at the coronary arteries to see if there is any blockage.  How is this treated? Treatment for ACS may include:  Healthy behavioral changes to reduce or control risk factors.  Medicine.  Coronary stenting.A stent helps to keep an artery open.  Coronary angioplasty. This procedure widens a narrowed or blocked artery.  Coronary artery bypass surgery. This will allow your blood to pass the blockage (bypass) to reach your heart.  Follow these instructions at home: Eating and drinking  Follow a heart-healthy diet. A dietitian can you help to educate you about healthy food options and changes.  Use healthy cooking methods such as roasting, grilling, broiling, baking, poaching, steaming, or stir-frying. Talk to a dietitian to learn more about healthy cooking methods. Medicines  Take medicines only as directed by your health care provider.  Do not take the following medicines unless your health care provider approves: ? Nonsteroidal anti-inflammatory drugs (NSAIDs), such as ibuprofen, naproxen, or celecoxib. ? Vitamin supplements that contain vitamin A, vitamin E, or both. ? Hormone replacement therapy that contains estrogen with or without progestin.  Stop illegal drug use. Activity  Follow an exercise program that is approved by your health care provider.  Plan rest periods when you are fatigued. Lifestyle  Do not use any tobacco products, including cigarettes, chewing tobacco, or electronic cigarettes. If you need help quitting, ask your health care provider.  If you drink alcohol, and your health care provider approves, limit your alcohol intake  to no more than 1 drink per day. One drink equals 12 ounces of beer, 5 ounces of wine, or 1 ounces of hard liquor.  Learn to manage stress.  Maintain a healthy weight. Lose weight as approved by your health care provider. General instructions  Manage other health conditions,  such as hypertension and diabetes, as directed by your health care provider.  Keep all follow-up visits as directed by your health care provider. This is important.  Your health care provider may ask you to monitor your blood pressure. A blood pressure reading consists of a higher number over a lower number, such as 110 over 72, written as 110/72. Ideally, your blood pressure should be: ? Below 140/90 if you have no other medical conditions. ? Below 130/80 if you have diabetes or kidney disease. Get help right away if:  You have pain in your chest, neck, arm, jaw, stomach, or back that lasts more than a few minutes, is recurring, or is not relieved by taking medicine under your tongue (sublingual nitroglycerin).  You have profuse sweating without cause.  You have unexplained: ? Heartburn or indigestion. ? Shortness of breath or difficulty breathing. ? Nausea or vomiting. ? Fatigue. ? Feelings of nervousness or anxiety. ? Weakness. ? Diarrhea.  You have sudden light-headedness or dizziness.  You faint. These symptoms may represent a serious problem that is an emergency. Do not wait to see if the symptoms will go away. Get medical help right away. Call your local emergency services (911 in the [place] Do not drive yourself to the clinic or hospital. This information is not intended to replace advice given to you by your health care provider. Make sure you discuss any questions you have with your health care provider. Document Released: 02/22/2005 Document Revised: 08/06/2015 Document Reviewed: 06/26/2013 Elsevier Interactive Patient Education  2017 [employer]

## 2017-01-29 NOTE — Progress Notes (Signed)
ANTICOAGULATION CONSULT NOTE - Follow Up Consult  Pharmacy Consult for Heparin Indication: chest pain/ACS  Allergies  Allergen Reactions  . Penicillins Swelling and Rash    ANGIOEDEMA "SWELLING OF ENTIRE BODY"  Has patient had a PCN reaction causing immediate rash, facial/tongue/throat swelling, SOB or lightheadedness with hypotension: No Has patient had a PCN reaction causing severe rash involving mucus membranes or skin necrosis: No Has patient had a PCN reaction that required hospitalization No Has patient had a PCN reaction occurring within the last 10 years: No If all of the above answers are "NO", then may proceed with Cephalosporin use.     Patient Measurements: Height: 5\' 7"  (170.2 cm) Weight: 229 lb 8 oz (104.1 kg) IBW/kg (Calculated) : 66.1 Heparin Dosing Weight: 89kg  Vital Signs: Temp: 99 F (37.2 C) (11/24 0828) Temp Source: Oral (11/24 0828) BP: 155/91 (11/24 0829) Pulse Rate: 80 (11/24 0829)  Labs: Recent Labs    01/27/17 1450 01/28/17 0049  01/28/17 0232 01/28/17 0945 01/28/17 1213 01/29/17 0322 01/29/17 0921  HGB 12.3*  --   --  11.3*  --   --  11.7*  --   HCT 37.7*  --   --  34.9*  --   --  36.0*  --   PLT 273  --   --  238  --   --  254  --   HEPARINUNFRC  --   --    < > 1.70* >2.20* 0.51 0.42  --   CREATININE 4.33*  --   --  3.87*  --   --   --  4.20*  TROPONINI  --  3.11*  --  3.04* 2.31*  --   --   --    < > = values in this interval not displayed.    Estimated Creatinine Clearance: 21 mL/min (A) (by C-G formula based on SCr of 4.2 mg/dL (H)).   Medications:  Heparin @ 1100 units/hr  Assessment: 61yom continues on heparin for NSTEMI with no plan for cath. Heparin level is therapeutic at 0.42. CBC stable. No bleeding reported.  Goal of Therapy:  Heparin level 0.3-0.7 units/ml Monitor platelets by anticoagulation protocol: Yes   Plan:  1) Continue heparin at 1100 units/hr 2) Daily heparin level and CBC  Fredrik Rigger 01/29/2017,11:16 AM

## 2017-01-29 NOTE — Progress Notes (Signed)
Progress Note  Patient Name: Richard Barnett Date of Encounter: 01/29/2017  Primary Cardiologist: Dr. Ian MalkinJunagadhwalla [facility]  Subjective   Denies any CP. Some headache.   Inpatient Medications    Scheduled Meds: . amLODipine  5 mg Oral Daily  . aspirin EC  81 mg Oral Daily  . atorvastatin  80 mg Oral q1800  . brimonidine  1 drop Both Eyes BID  . brinzolamide  1 drop Both Eyes BID  . clopidogrel  75 mg Oral Daily  . insulin aspart  0-9 Units Subcutaneous TID WC  . isosorbide mononitrate  60 mg Oral Daily  . latanoprost  1 drop Both Eyes QHS  . metoprolol tartrate  50 mg Oral BID WC  . pantoprazole  40 mg Oral Daily   Continuous Infusions: . heparin 1,100 Units/hr (01/29/17 1127)   PRN Meds: acetaminophen, nitroGLYCERIN   Vital Signs    Vitals:   01/29/17 0415 01/29/17 0828 01/29/17 0829 01/29/17 1210  BP:   (!) 155/91 132/90  Pulse:   80 65  Resp:   (!) 24 14  Temp:  99 F (37.2 C)  98.7 F (37.1 C)  TempSrc:  Oral  Oral  SpO2:   100% 100%  Weight: 229 lb 8 oz (104.1 kg)     Height:        Intake/Output Summary (Last 24 hours) at 01/29/2017 1538 Last data filed at 01/29/2017 1500 Gross per 24 hour  Intake 902.05 ml  Output 750 ml  Net 152.05 ml   Filed Weights   01/27/17 1453 01/27/17 2133 01/29/17 0415  Weight: 230 lb (104.3 kg) 227 lb 8 oz (103.2 kg) 229 lb 8 oz (104.1 kg)    Telemetry    NSR without significant ventricular ectopy - Personally Reviewed  ECG    NSR with TWI in inferolateral leads - Personally Reviewed  Physical Exam   GEN: No acute distress.   Neck: No JVD Cardiac: RRR, no murmurs, rubs, or gallops.  Respiratory: Clear to auscultation bilaterally. GI: Soft, nontender, non-distended  MS: No edema; No deformity. Neuro:  Nonfocal  Psych: Normal affect   Labs    Chemistry Recent Labs  Lab 01/27/17 1450 01/28/17 0232 01/29/17 0921  NA 138 137 135  K 3.7 3.9 4.5  CL 105 108 107  CO2 24 21* 20*    GLUCOSE 97 114* 119*  BUN 35* 35* 37*  CREATININE 4.33* 3.87* 4.20*  CALCIUM 8.4* 7.9* 8.4*  GFRNONAA 13* 15* 14*  GFRAA 16* 18* 16*  ANIONGAP 9 8 8      Hematology Recent Labs  Lab 01/27/17 1450 01/28/17 0232 01/29/17 0322  WBC 5.7 5.5 5.8  RBC 4.27 3.98* 4.10*  HGB 12.3* 11.3* 11.7*  HCT 37.7* 34.9* 36.0*  MCV 88.3 87.7 87.8  MCH 28.8 28.4 28.5  MCHC 32.6 32.4 32.5  RDW 14.1 13.8 14.1  PLT 273 238 254    Cardiac Enzymes Recent Labs  Lab 01/28/17 0049 01/28/17 0232 01/28/17 0945  TROPONINI 3.11* 3.04* 2.31*    Recent Labs  Lab 01/27/17 1501 01/27/17 1828  TROPIPOC 0.04 1.84*     BNPNo results for input(s): BNP, PROBNP in the last 168 hours.   DDimer No results for input(s): DDIMER in the last 168 hours.   Radiology    Dg Chest 2 View  Result Date: 01/27/2017 CLINICAL DATA:  Chest pain. EXAM: CHEST  2 VIEW COMPARISON:  Chest x-ray dated January 13, 2017. FINDINGS: Stable cardiomegaly. Normal  pulmonary vascularity. No focal consolidation, pleural effusion, or pneumothorax. Minimal bibasilar atelectasis. No acute osseous abnormality. IMPRESSION: Stable cardiomegaly.  No active cardiopulmonary disease. Electronically Signed   By: Obie Dredge M.D.   On: 01/27/2017 15:42    Cardiac Studies   Echo 01/13/2017 LV EF: 55% -   60%  Study Conclusions  - Left ventricle: The cavity size was normal. Wall thickness was   increased in a pattern of mild LVH. There was mild concentric   hypertrophy. Systolic function was normal. The estimated ejection   fraction was in the range of 55% to 60%. Wall motion was normal;   there were no regional wall motion abnormalities. Features are   consistent with a pseudonormal left ventricular filling pattern,   with concomitant abnormal relaxation and increased filling   pressure (grade 2 diastolic dysfunction). - Mitral valve: There was mild regurgitation. - Left atrium: The atrium was mildly dilated. - Tricuspid valve:  There was mild-moderate regurgitation. - Pulmonary arteries: Systolic pressure was mildly increased. PA   peak pressure: 40 mm Hg (S).   Cath 01/14/2017 Conclusion     Ost LAD lesion is 30% stenosed.  Prox LAD lesion is 30% stenosed.  Mid LAD lesion is 30% stenosed.  1st Mrg lesion is 90% stenosed.  Ost 1st Diag lesion is 75% stenosed.  Prox RCA to Mid RCA lesion is 10% stenosed.  LV end diastolic pressure is moderately elevated.   1. Left dominant circulation 2. 2 vessel obstructive CAD    - 75% small diagonal branch    - 90% first OM - this is a small branch that bifurcates in the mid vessel. There is in stent restenosis. The vessel is small < 2.25 mm and disease extends past a bifurcation 3. Moderately elevated LVEDP  Plan: I only visualize one stent in the OM.  I would recommend continued medical therapy. In my opinion he would not benefit from repeat intervention of OM since this is a very small branch and previously stented.  Other major vessels are without significant disease.     Patient Profile     61 y.o. Richard Barnett  with a hx of CAD, HTN, HLD, DM II, and CKD stage IV/V who is being seen today for the evaluation of NSTEMI at the request of Dr. Lorenso Courier.  Assessment & Plan    1. NSTEMI: reviewed his recent cath report, small OM1 bifurcation lesion not amenable for PCI, new TWI in lateral leads with persistent TWI in inferior leads. Currently chest pain free.   - started on plavix during this admission given elevated trop. Continue ASA and plavix.       - followup with his cardiologist in 1-2 weeks for medication titrate. BMET and CBC in 1 week to check for anemia and worsening renal function    - he has completed 36 hours of IV heparin, may discontinue heparin and discharge today once seen by cardiology attending  2. CAD: mild disease in major coronary vessels, 90% OM1 and 75% ost diag  3. HTN: BP initially elevated, however improved after Imdur increased to  60 mg daily and amlodipine increased to 5mg  daily.   - BP improved, but is having headache on higher dose of Imdur, will decrease Imdur to 30mg  daily, while increase amlodipine to 10mg  daily.   4. HLD: Last LDL in June 2018 was 77, goal < 70, lipitor increased to 80mg  daily yesterday. FLP and LFT in 6-8 weeks  5. DM II: on insulin  6. CKD stage IV/V:  Need close outpatient monitoring with 1 week BMET   For questions or updates, please contact [facility] Please consult [email or web address] for contact info under Cardiology/STEMI.      Signed, Azalee Course, PA  01/29/2017, 3:38 PM     He can be discharged  Issues are complicated-- he has some limited understanding of his illness, but even less it seems of his health care team.  He has missed appts, He calls his doctors offices, though he is not sure who they are, not recognizing the name of his PCP.  He mentions that he is handicapped and not able to remember but his daughter helps him  Agree with high intensity statins plavix and ASA BP control    His graft was placed here,  I dont know if he has local nephrology care or whether it is in [place]

## 2017-01-29 NOTE — Progress Notes (Signed)
   Subjective: [name] was sitting on the side of his bed eating breakfast.  He states that he denies any chest pain since admission.  However, he states that he has shortness of breath that is present at nighttime.  When asked whether the shortness of breath also occurs during the day he denied.  Objective:  Vital signs in last 24 hours: Vitals:   01/28/17 1937 01/28/17 2342 01/29/17 0410 01/29/17 0415  BP: 135/83 (!) 139/92 (!) 145/108   Pulse:   76   Resp:   (!) 21   Temp: 99.2 F (37.3 C) 99 F (37.2 C) 98.1 F (36.7 C)   TempSrc: Oral Oral Oral   SpO2: 98%     Weight:    229 lb 8 oz (104.1 kg)  Height:       Physical Exam  Constitutional: He appears well-developed and well-nourished.  Non-toxic appearance. He does not appear ill.  HENT:  Head: Normocephalic and atraumatic.  Cardiovascular: Normal rate, regular rhythm and normal pulses.  Pulmonary/Chest: Effort normal and breath sounds normal. No accessory muscle usage. No respiratory distress.  Abdominal: Soft. Bowel sounds are normal. He exhibits no distension. There is no tenderness.  Neurological: He is alert.  Psychiatric: He has a normal mood and affect. His behavior is normal. His mood appears not anxious. He is not agitated.   Assessment/Plan:  Chest pain possibly NSTEMI The patient states that he is comfortable and does note any chest pain since admission.   -Clonidine was stopped yesterday 01/28/17 and he did well with his bp ranging 123-167/82-92 -Increased Imdur to 60 mg daily.   -Increased Lipitor to 80 mg daily  -continue aspirin 81 mg -Plavix 75mg  added  -Pantoprazole 40 mg daily -Continuous cardiac monitoring- no abnormal rhythms seen when telemetry reviewed -Continue heparin drip  Acute on chronic kidney injury Patient's creatinine today 01/28/2017 was 3.87 which is decreased from prior 4.33 yesterday.  -Urine creatinine = 55, urine sodium = 88.  The patient's FeNa= 4.5% which places him the the  post obstructive category.  -Bladder scan showed 65mL post void residual volume -He is up +272mL yesterday -pending bmp  HTN The patient's blood pressure during the past 24 hrs has ranged 123-167/82-92  -Increased Imdur to 60 mg daily -Continue metoprolol 50 mg twice daily -Stopped clonidine -Continue amlodipine 2.5 mg today 01/28/2017 and start amlodipine 5 mg tomorrow 01/29/17  Diabetes Mellitus type 2 The patient has a hba1c=6.6 with cbg's 97-137. Repeat HbA1c needed outpatient. -SSI-sensitive  OSA The patient has been short of breath at night. May consider sleep study outpatient.   Dispo: Anticipated discharge today. Social work gave the patient and his daughter a list of assisted living facility resources to seek out on their own.   Lorenso Courier, MD Internal Medicine PGY1 Pager:[phone] 01/29/2017, 8:03 AM

## 2017-01-29 NOTE — Progress Notes (Signed)
I spoke with the patient's daughter and told her the changes that have been made to the patient's medication. I told the daughter that the patient will be given paper scripts so that the medications can be picked up anywhere. She stated that she will help her dad pick up his medication. She also expressed interested that the [facility] so that his pharmacy and pcp will be in the same location.   -Spoke to case management regarding setting [facility] at [facility]  Lorenso Courier, MD Internal Medicine PGY1 [phone] 01/29/2017, 5:35 PM

## 2017-01-29 NOTE — Progress Notes (Signed)
Discharge instructions given to patient, as well as when to call PCP and cardiologist for follow up. Paper prescriptions given. Discussed patient medications at length and the importance of taking every day as prescribed. Also discussed the importance of making follow up appointments. VSS. IV and telemetry removed. Pt given bus passes to get home.  Versie Starks, RN

## 2017-02-04 NOTE — Discharge Summary (Signed)
Name: Richard Barnett MRN: [id number] DOB: 22-Feb-1956 61 y.o. PCP: Patient, No Pcp Per  Date of Admission: 01/27/2017  2:34 PM Date of Discharge: 02/04/2017 Attending Physician: Dr. Doneen PoissonLawrence Klima   Discharge Diagnosis:    NSTEMI (non-ST elevated myocardial infarction) [name](HCC)  Discharge Medications: Allergies as of 01/29/2017      Reactions   Penicillins Swelling, Rash   ANGIOEDEMA "SWELLING OF ENTIRE BODY" Has patient had a PCN reaction causing immediate rash, facial/tongue/throat swelling, SOB or lightheadedness with hypotension: No Has patient had a PCN reaction causing severe rash involving mucus membranes or skin necrosis: No Has patient had a PCN reaction that required hospitalization No Has patient had a PCN reaction occurring within the last 10 years: No If all of the above answers are "NO", then may proceed with Cephalosporin use.      Medication List    STOP taking these medications   cloNIDine 0.2 MG tablet Commonly known as:  CATAPRES     TAKE these medications   amLODipine 10 MG tablet Commonly known as:  NORVASC Take 1 tablet (10 mg total) by mouth daily. What changed:  Another medication with the same name was removed. Continue taking this medication, and follow the directions you see here.   aspirin 81 MG EC tablet Take 1 tablet (81 mg total) by mouth daily. Swallow whole.   atorvastatin 80 MG tablet Commonly known as:  LIPITOR Take 1 tablet (80 mg total) by mouth daily at 6 PM. What changed:    medication strength  how much to take  how to take this  when to take this  additional instructions   clopidogrel 75 MG tablet Commonly known as:  PLAVIX Take 1 tablet (75 mg total) by mouth daily.   isosorbide mononitrate 30 MG 24 hr tablet Commonly known as:  IMDUR Take 1 tablet (30 mg total) by mouth daily.   latanoprost 0.005 % ophthalmic solution Commonly known as:  XALATAN Place 1 drop into both eyes at bedtime.   metoprolol tartrate  50 MG tablet Commonly known as:  LOPRESSOR Take 1 tablet (50 mg total) by mouth 2 (two) times daily with a meal.   nitroGLYCERIN 0.4 MG SL tablet Commonly known as:  NITROSTAT DISSOLVE 1 TABLET UNDER THE TONGUE AS NEEDED FOR CHEST PAIN. REPEAT AS NEEDED EVERY 5 MINUTES UP TO A TOTAL OF 3 DOSES What changed:  See the new instructions.   omeprazole 20 MG tablet Commonly known as:  PRILOSEC OTC Take 1 tablet (20 mg total) by mouth daily.   oxyCODONE-acetaminophen 5-325 MG tablet Commonly known as:  PERCOCET/ROXICET Take 1 tablet by mouth every 6 (six) hours as needed.   SIMBRINZA 1-0.2 % Susp Generic drug:  Brinzolamide-Brimonidine Place 1 drop into both eyes 2 (two) times daily.       Disposition and follow-up:   Mr.Richard Barnett was discharged from Richard Barnett in stable condition.  At the Barnett follow up visit please address:  1. NSTEMI: Please ensure that the patient is taking amlodipine 10 mg daily, atorvastatin 80 mg daily, metoprolol 50 mg daily, Imdur 30 mg daily, Plavix 75 mg daily.  Please also recheck his blood pressure.  Acute on chronic kidney injury: The patient's creatinine increased to 4.33 FeNa=4.5%, bladder scan showed 45ml post void  residual volume.  Repeat BMP should be done at follow-up visit.  2.  Labs / imaging needed at time of follow-up: None  3.  Pending labs/ test needing follow-up: None  Follow-up Appointments: Follow-up Information    [name], MD. Schedule an appointment as soon as possible for a visit.   Specialty:  Cardiology Why:  please followup with your cardiologist as soon as possible.  Contact information: [address] [place] [address] [phone]        [facility] Follow up.   Contact information: [address] [place] [address] [phone]       Jackie Plum, MD Follow up in 1 week(s).   Specialty:   Internal Medicine Contact information: [address] [place] [address] [phone]           Barnett Course by problem list:  NSTEMI (non-ST elevated myocardial infarction) [facility]) The patient presented with left sided chest pain with radiation down the left arm. The patient had recently been admitted from 01/13/17-01/15/17 during which time he had a LHC done which showed 2 vessel CAD with 75% first diagonal stenosis and 90% first OM stenosis. It was deemed that the patient should be medically managed with aspirin, imdur, lipitor, and amlodipine without metoprolol due to the patient's heartrate being low on discharge. The patient was readmitted on 01/28/17 with chest pain post not taking his cardiac medication. The patients troponin was trended: 3.11, 3.04, 2.31. EKG on admission showed inferolateral T wave inversions but no st segment changes. The patient's clonidine was stopped, imdur was increased to 60mg  qd, lipitor was increased to 80mg  qd, and plavix 75mg  was added. The patient did not experience any rebound hypertension and he did not experience any new chest pain. The changes in the patient's medications were explained to both the patient and the daughter and he was also given written directions on how to take the medication. Case management was to setup the patient with [facility] for close follow up.   Discharge Vitals:   BP 135/80   Pulse 72   Temp 98.7 F (37.1 C) (Oral)   Resp 13   Ht 5\' 7"  (1.702 m)   Wt 229 lb 8 oz (104.1 kg)   SpO2 100%   BMI 35.94 kg/m   Pertinent Labs, Studies, and Procedures:   Troponin:3.11, 3.04, 2.31  BMP Latest Ref Rng & Units 01/29/2017 01/28/2017 01/27/2017  Glucose 65 - 99 mg/dL 387(F) 643(P) 97  BUN 6 - 20 mg/dL 29(J) 18(A) 41(Y)  Creatinine 0.61 - 1.24 mg/dL 6.06(T) 0.16(W) 1.09(N)  Sodium 135 - 145 mmol/L 135 137 138  Potassium 3.5 - 5.1 mmol/L 4.5 3.9 3.7  Chloride 101 - 111 mmol/L 107 108 105    CO2 22 - 32 mmol/L 20(L) 21(L) 24  Calcium 8.9 - 10.3 mg/dL 2.3(F) 7.9(L) 8.4(L)     Chest x-ray (01/27/2017): No active cardiopulmonary disease.  EKG (01/27/17): Left ventricular hypertrophy, T wave inversions lateral leads   Left heart cath (01/14/2017):   Ost LAD lesion is 30% stenosed.  Prox LAD lesion is 30% stenosed.  Mid LAD lesion is 30% stenosed.  1st Mrg lesion is 90% stenosed.  Ost 1st Diag lesion is 75% stenosed.  Prox RCA to Mid RCA lesion is 10% stenosed.  LV end diastolic pressure is moderately elevated.   1. Left dominant circulation 2. 2 vessel obstructive CAD    - 75% small diagonal branch    - 90% first OM - this is a small branch that bifurcates in the mid vessel. There is in stent restenosis. The vessel is small <  2.25 mm and disease extends past a bifurcation 3. Moderately elevated LVEDP  Discharge Instructions: Discharge Instructions    Call MD for:  extreme fatigue   Complete by:  As directed    Call MD for:  persistant dizziness or light-headedness   Complete by:  As directed    Call MD for:  persistant nausea and vomiting   Complete by:  As directed    Diet - low sodium heart healthy   Complete by:  As directed    Increase activity slowly   Complete by:  As directed       Signed: Lorenso Courier, MD Internal Medicine PGY1 [phone]

## 2017-04-07 ENCOUNTER — Encounter (HOSPITAL_COMMUNITY): Payer: Self-pay

## 2017-04-07 ENCOUNTER — Emergency Department (HOSPITAL_COMMUNITY)
Admission: EM | Admit: 2017-04-07 | Discharge: 2017-04-07 | Disposition: A | Discharge status: Home / Self Care | Payer: Medicaid Other | Attending: Emergency Medicine | Admitting: Emergency Medicine

## 2017-04-07 ENCOUNTER — Other Ambulatory Visit: Payer: Self-pay

## 2017-04-07 ENCOUNTER — Emergency Department (HOSPITAL_COMMUNITY): Payer: Medicaid Other

## 2017-04-07 DIAGNOSIS — E213 Hyperparathyroidism, unspecified: Secondary | ICD-10-CM | POA: Insufficient documentation

## 2017-04-07 DIAGNOSIS — R1031 Right lower quadrant pain: Secondary | ICD-10-CM | POA: Diagnosis not present

## 2017-04-07 DIAGNOSIS — Z79899 Other long term (current) drug therapy: Secondary | ICD-10-CM | POA: Insufficient documentation

## 2017-04-07 DIAGNOSIS — E119 Type 2 diabetes mellitus without complications: Secondary | ICD-10-CM | POA: Insufficient documentation

## 2017-04-07 DIAGNOSIS — N184 Chronic kidney disease, stage 4 (severe): Secondary | ICD-10-CM

## 2017-04-07 DIAGNOSIS — Z7982 Long term (current) use of aspirin: Secondary | ICD-10-CM | POA: Insufficient documentation

## 2017-04-07 DIAGNOSIS — I1310 Hypertensive heart and chronic kidney disease without heart failure, with stage 1 through stage 4 chronic kidney disease, or unspecified chronic kidney disease: Secondary | ICD-10-CM | POA: Insufficient documentation

## 2017-04-07 DIAGNOSIS — Z7902 Long term (current) use of antithrombotics/antiplatelets: Secondary | ICD-10-CM | POA: Insufficient documentation

## 2017-04-07 DIAGNOSIS — I2510 Atherosclerotic heart disease of native coronary artery without angina pectoris: Secondary | ICD-10-CM | POA: Insufficient documentation

## 2017-04-07 LAB — CBC
HCT: 37 % — ABNORMAL LOW (ref 39.0–52.0)
Hemoglobin: 12.2 g/dL — ABNORMAL LOW (ref 13.0–17.0)
MCH: 28.7 pg (ref 26.0–34.0)
MCHC: 33 g/dL (ref 30.0–36.0)
MCV: 87.1 fL (ref 78.0–100.0)
Platelets: 324 10*3/uL (ref 150–400)
RBC: 4.25 MIL/uL (ref 4.22–5.81)
RDW: 14.2 % (ref 11.5–15.5)
WBC: 7.1 10*3/uL (ref 4.0–10.5)

## 2017-04-07 LAB — COMPREHENSIVE METABOLIC PANEL
ALK PHOS: 65 U/L (ref 38–126)
ALT: 18 U/L (ref 17–63)
AST: 20 U/L (ref 15–41)
Albumin: 3.1 g/dL — ABNORMAL LOW (ref 3.5–5.0)
Anion gap: 11 (ref 5–15)
BILIRUBIN TOTAL: 0.7 mg/dL (ref 0.3–1.2)
BUN: 35 mg/dL — ABNORMAL HIGH (ref 6–20)
CALCIUM: 8.5 mg/dL — AB (ref 8.9–10.3)
CO2: 22 mmol/L (ref 22–32)
CREATININE: 4.67 mg/dL — AB (ref 0.61–1.24)
Chloride: 105 mmol/L (ref 101–111)
GFR calc non Af Amer: 12 mL/min — ABNORMAL LOW (ref >60)
GFR, EST AFRICAN AMERICAN: 14 mL/min — AB (ref >60)
Glucose, Bld: 108 mg/dL — ABNORMAL HIGH (ref 65–99)
Potassium: 3.8 mmol/L (ref 3.5–5.1)
Sodium: 138 mmol/L (ref 135–145)
TOTAL PROTEIN: 6.7 g/dL (ref 6.5–8.1)

## 2017-04-07 LAB — URINALYSIS, ROUTINE W REFLEX MICROSCOPIC
Bacteria, UA: NONE SEEN
Bilirubin Urine: NEGATIVE
GLUCOSE, UA: 150 mg/dL — AB
KETONES UR: NEGATIVE mg/dL
Leukocytes, UA: NEGATIVE
NITRITE: NEGATIVE
PH: 6 (ref 5.0–8.0)
Protein, ur: >=300 mg/dL — AB
Specific Gravity, Urine: 1.013 (ref 1.005–1.030)

## 2017-04-07 LAB — LIPASE, BLOOD: Lipase: 26 U/L (ref 11–51)

## 2017-04-07 MED ORDER — FENTANYL CITRATE (PF) 100 MCG/2ML IJ SOLN
50.0000 ug | Freq: Once | INTRAMUSCULAR | Status: AC
  Administered 2017-04-07: 50 ug via INTRAVENOUS
  Filled 2017-04-07: qty 2

## 2017-04-07 NOTE — ED Notes (Signed)
Pt. Able to keep fluids down without n/v

## 2017-04-07 NOTE — ED Notes (Signed)
Pt. Return from XRAY via stretcher. 

## 2017-04-07 NOTE — ED Provider Notes (Signed)
[facility] EMERGENCY DEPARTMENT Provider Note   CSN: [id number] Arrival date & time: 04/07/17  0056     History   Chief Complaint No chief complaint on file.   HPI Richard Barnett is a 62 y.o. Christo.  The history is provided by the patient.  Abdominal Pain   This is a new problem. The current episode started 3 to 5 hours ago. The problem occurs constantly. The problem has not changed since onset.The pain is located in the RLQ. The pain is moderate. Pertinent negatives include fever and vomiting. The symptoms are aggravated by palpation. Nothing relieves the symptoms.   Pt reports onset of right flank and RLQ pain several hrs ago No fever/vomiting He reports constipation He reports feeling short of breath He reports he is scheduled to start dialysis soon  Past Medical History:  Diagnosis Date  . Arthritis    "right leg" (08/22/2014)  . CAD (coronary artery disease)    a. CAD s/p 2 stent placement in [place] 2016. b. Neg nuc 05/2014 & 01/2016.  Marland Kitchen Chest pain 01/2017  . CKD (chronic kidney disease), stage IV (HCC)    [name] 08/22/2014  . Daily headache   . Depression    "I always get that" (08/22/2014)  . Diabetes mellitus (HCC)   . DVT (deep venous thrombosis) (HCC) ?   RLE  . GERD (gastroesophageal reflux disease)   . Heart murmur   . Hypercholesterolemia   . Hypertension   . Myocardial infarction (HCC)   . Wears glasses     Patient Active Problem List   Diagnosis Date Noted  . NSTEMI (non-ST elevated myocardial infarction) (HCC) 01/27/2017  . Diabetes mellitus (HCC)   . Anemia of chronic renal failure   . Secondary hyperparathyroidism of renal origin (HCC)   . Proteinuria   . Arthritis   . Depression   . Deep vein thrombosis (HCC)   . Myocardial infarction (HCC)   . Heart failure, unspecified (HCC)   . Heart murmur   . Prediabetes 02/22/2015  . Chronic kidney disease, stage IV (severe) (HCC)   . Hyperlipidemia 05/22/2014  . Chest pain  05/21/2014  . Hypokalemia 05/21/2014  . CAD (coronary artery disease) 05/21/2014  . Hypertension 05/21/2014    Past Surgical History:  Procedure Laterality Date  . AV FISTULA PLACEMENT Left 10/01/2016   Procedure: INSERTION OF 4-7MM X 45CM ARTERIOVENOUS (AV) GORE-TEX GRAFT ARM;  Surgeon: Chuck Hint, MD;  Location: [facility] OR;  Service: Vascular;  Laterality: Left;  . CORONARY ANGIOPLASTY WITH STENT PLACEMENT     "1 + 1"  . DRAINAGE AND CLOSURE OF LYMPHOCELE Left 10/29/2016   Procedure: EVACUATION OF LYMPHOCELE LEFT ARM ARTERIOVENOUS GRAFT;  Surgeon: Chuck Hint, MD;  Location: [facility] OR;  Service: Vascular;  Laterality: Left;  . LEFT HEART CATH AND CORONARY ANGIOGRAPHY N/A 01/14/2017   Procedure: LEFT HEART CATH AND CORONARY ANGIOGRAPHY;  Surgeon: [place], Peter M, MD;  Location: [facility] INVASIVE CV LAB;  Service: Cardiovascular;  Laterality: N/A;       Home Medications    Prior to Admission medications   Medication Sig Start Date End Date Taking? Authorizing Provider  amLODipine (NORVASC) 10 MG tablet Take 1 tablet (10 mg total) by mouth daily. 01/30/17 03/01/17  Lorenso Courier, MD  aspirin 81 MG EC tablet Take 1 tablet (81 mg total) by mouth daily. Swallow whole. Patient not taking: Reported on 01/27/2017 02/10/16   Massie Maroon, FNP  atorvastatin (LIPITOR) 80 MG tablet  Take 1 tablet (80 mg total) by mouth daily at 6 PM. 01/29/17   Chundi, Vahini, MD  Brinzolamide-Brimonidine [facility]) 1-0.2 % SUSP Place 1 drop into both eyes 2 (two) times daily.    [provider]  clopidogrel (PLAVIX) 75 MG tablet Take 1 tablet (75 mg total) by mouth daily. 01/30/17   Lorenso Courier, MD  isosorbide mononitrate (IMDUR) 30 MG 24 hr tablet Take 1 tablet (30 mg total) by mouth daily. 01/29/17 02/28/17  Chundi, Sherlyn Lees, MD  latanoprost (XALATAN) 0.005 % ophthalmic solution Place 1 drop into both eyes at bedtime.    [provider]  metoprolol tartrate (LOPRESSOR) 50 MG  tablet Take 1 tablet (50 mg total) by mouth 2 (two) times daily with a meal. 01/29/17 02/28/17  Chundi, Sherlyn Lees, MD  nitroGLYCERIN (NITROSTAT) 0.4 MG SL tablet DISSOLVE 1 TABLET UNDER THE TONGUE AS NEEDED FOR CHEST PAIN. REPEAT AS NEEDED EVERY 5 MINUTES UP TO A TOTAL OF 3 DOSES Patient taking differently: DISSOLVE 1 TABLET (0.4 MG) UNDER THE TONGUE AS NEEDED FOR CHEST PAIN. REPEAT AS NEEDED EVERY 5 MINUTES UP TO A TOTAL OF 3 DOSES 12/15/15   Henrietta Hoover, NP  omeprazole (PRILOSEC OTC) 20 MG tablet Take 1 tablet (20 mg total) by mouth daily. Patient not taking: Reported on 01/27/2017 08/11/16 08/11/17  Thomasene Lot, MD  oxyCODONE-acetaminophen (PERCOCET/ROXICET) 5-325 MG tablet Take 1 tablet by mouth every 6 (six) hours as needed. Patient not taking: Reported on 01/27/2017 10/29/16   Lars Mage, PA-C    Family History Family History  Problem Relation Age of Onset  . Hypertension Mother   . Kidney disease Mother   . Heart disease Mother   . Heart disease Father   . Hypertension Father     Social History Social History   Tobacco Use  . Smoking status: Former Smoker    Packs/day: 0.00    Years: 40.00    Pack years: 0.00    Types: Cigarettes    Last attempt to quit: 08/29/2010    Years since quitting: 6.6  . Smokeless tobacco: Never Used  . Tobacco comment: "quit smoking cigarettes in  2011" 10/27/16  Substance Use Topics  . Alcohol use: No    Comment: 10/27/16 "stopped in ~ 2011"  . Drug use: No    Comment: 10/27/16 "stopped in ~ 2011; all types of drugs""     Allergies   Penicillins   Review of Systems Review of Systems  Constitutional: Negative for fever.  Respiratory: Positive for shortness of breath.   Gastrointestinal: Positive for abdominal pain. Negative for vomiting.  All other systems reviewed and are negative.    Physical Exam Updated Vital Signs BP (!) 172/105 (BP Location: Right Arm) Comment: Simultaneous filing. User may not have seen previous data.   Pulse 70 Comment: Simultaneous filing. User may not have seen previous data.  Temp 97.9 F (36.6 C) (Oral)   Resp 20 Comment: Simultaneous filing. User may not have seen previous data.  Ht 1.702 m (5\' 7" )   Wt 81.6 kg (180 lb)   SpO2 97% Comment: Simultaneous filing. User may not have seen previous data.  BMI 28.19 kg/m   Physical Exam CONSTITUTIONAL: chronically ill appearing HEAD: Normocephalic/atraumatic EYES: EOMI ENMT: Mucous membranes moist NECK: supple no meningeal signs SPINE/BACK:entire spine nontender CV: S1/S2 noted, no murmurs/rubs/gallops noted LUNGS: Lungs are clear to auscultation bilaterally, no apparent distress ABDOMEN: soft, protuberant, but no focal quadrant tenderness, no rebound or guarding, bowel sounds noted throughout  abdomen.  No hernia noted.   GU:no cva tenderness NEURO: Pt is awake/alert/appropriate, moves all extremitiesx4.  No facial droop.   EXTREMITIES:   full ROM SKIN: warm, color normal PSYCH: no abnormalities of mood noted, alert and oriented to situation   ED Treatments / Results  Labs (all labs ordered are listed, but only abnormal results are displayed) Labs Reviewed  COMPREHENSIVE METABOLIC PANEL - Abnormal; Notable for the following components:      Result Value   Glucose, Bld 108 (*)    BUN 35 (*)    Creatinine, Ser 4.67 (*)    Calcium 8.5 (*)    Albumin 3.1 (*)    GFR calc non Af Amer 12 (*)    GFR calc Af Amer 14 (*)    All other components within normal limits  CBC - Abnormal; Notable for the following components:   Hemoglobin 12.2 (*)    HCT 37.0 (*)    All other components within normal limits  URINALYSIS, ROUTINE W REFLEX MICROSCOPIC - Abnormal; Notable for the following components:   Glucose, UA 150 (*)    Hgb urine dipstick MODERATE (*)    Protein, ur >=300 (*)    Squamous Epithelial / LPF 0-5 (*)    All other components within normal limits  LIPASE, BLOOD    EKG  EKG Interpretation  Date/Time:  Thursday  April 07 2017 01:11:02 EST Ventricular Rate:  70 PR Interval:    QRS Duration: 79 QT Interval:  425 QTC Calculation: 459 R Axis:   5 Text Interpretation:  Sinus rhythm Probable left atrial enlargement Left ventricular hypertrophy Nonspecific T abnormalities, lateral leads No significant change since last tracing Confirmed by Zadie Rhine ([id number]) on 04/07/2017 1:40:46 AM       Radiology Dg Abd Acute W/chest  Result Date: 04/07/2017 CLINICAL DATA:  Abdominal pain for 5 days. Patient reports that he is in kidney failure. Fistula in the left upper arm but has not started dialysis yet. Tightness in the abdomen and short of breath. EXAM: DG ABDOMEN ACUTE W/ 1V CHEST COMPARISON:  Chest 01/27/2017 FINDINGS: Normal heart size and pulmonary vascularity. No focal airspace disease or consolidation in the lungs. No blunting of costophrenic angles. No pneumothorax. Mediastinal contours appear intact. Degenerative changes in the spine and shoulders. Scattered gas and stool throughout the colon. Scattered gas-filled small bowel. No small or large bowel distention. No free intra-abdominal air. No abnormal air-fluid levels. Surgical clips in the right upper quadrant. No radiopaque stones. Calcified phleboliths in the pelvis. Visualized bones appear intact. IMPRESSION: No evidence of active pulmonary disease. Nonobstructive bowel gas pattern. Electronically Signed   By: Burman Nieves M.D.   On: 04/07/2017 03:49    Procedures Procedures (including critical care time)  Medications Ordered in ED Medications  fentaNYL (SUBLIMAZE) injection 50 mcg (50 mcg Intravenous Given 04/07/17 0346)     Initial Impression / Assessment and Plan / ED Course  I have reviewed the triage vital signs and the nursing notes.  Pertinent labs & imaging results that were available during my care of the patient were reviewed by me and considered in my medical decision making (see chart for details).     5:35 AM In the  emergency department for right-sided abdominal pain.  On reassessment he was in no distress, resting comfortably he reports feeling improved.  No vomiting.  Labs at baseline, x-ray is negative.  He has no focal tenderness on repeat exam He briefly mentioned that has been having  chest pain for a long time.  This is not new. EKG is unremarkable  At this point, patient is very well-appearing and in no distress.  I feel he is appropriate for discharge home  Final Clinical Impressions(s) / ED Diagnoses   Final diagnoses:  Right lower quadrant abdominal pain  Chronic renal failure, stage 4 (severe) [facility])    ED Discharge Orders    None       Zadie Rhine, MD 04/07/17 [phone]

## 2017-04-07 NOTE — ED Triage Notes (Signed)
Pt. From home via EMS after abdominal pain X5 days. Pt. Reports seeing doctor and is in kidney failure. Pt. Has a fistula in left upper arm. Pt. Has not started dialysis yet. Pt. Reports feeling tightness in abdomen and feels SOB. SPO2 98% on ra. NAD at this time.

## 2017-04-07 NOTE — ED Notes (Signed)
Pt. To XRAY via stretcher. 

## 2017-04-07 NOTE — ED Notes (Signed)
Attempted IV unsuccessful 

## 2017-04-07 NOTE — Discharge Instructions (Signed)
°  SEEK IMMEDIATE MEDICAL ATTENTION IF: °The pain does not go away or becomes severe, particularly over the next 8-12 hours.  °A temperature above 100.4F develops.  °Repeated vomiting occurs (multiple episodes).  ° °Blood is being passed in stools or vomit (bright red or black tarry stools).  °Return also if you develop chest pain, difficulty breathing, dizziness or fainting, or become confused, poorly responsive, or inconsolable. ° °

## 2017-04-07 NOTE — ED Notes (Signed)
Unable to waste fentanyl in pixis. Wasted 50 mcg of fentanyl in sharps. Witnessed by Geroge Baseman, RN.

## 2017-05-13 ENCOUNTER — Encounter (HOSPITAL_COMMUNITY): Payer: Self-pay | Admitting: Emergency Medicine

## 2017-05-13 ENCOUNTER — Emergency Department (HOSPITAL_COMMUNITY): Payer: Medicaid Other

## 2017-05-13 ENCOUNTER — Inpatient Hospital Stay (HOSPITAL_COMMUNITY)
Admission: EM | Admit: 2017-05-13 | Discharge: 2017-05-21 | DRG: 252 | Disposition: A | Discharge status: Home / Self Care | Payer: Medicaid Other | Attending: Family Medicine | Admitting: Family Medicine

## 2017-05-13 DIAGNOSIS — F4325 Adjustment disorder with mixed disturbance of emotions and conduct: Secondary | ICD-10-CM

## 2017-05-13 DIAGNOSIS — E876 Hypokalemia: Secondary | ICD-10-CM | POA: Diagnosis present

## 2017-05-13 DIAGNOSIS — E785 Hyperlipidemia, unspecified: Secondary | ICD-10-CM | POA: Diagnosis present

## 2017-05-13 DIAGNOSIS — Z9119 Patient's noncompliance with other medical treatment and regimen: Secondary | ICD-10-CM

## 2017-05-13 DIAGNOSIS — E7800 Pure hypercholesterolemia, unspecified: Secondary | ICD-10-CM | POA: Diagnosis present

## 2017-05-13 DIAGNOSIS — F332 Major depressive disorder, recurrent severe without psychotic features: Secondary | ICD-10-CM

## 2017-05-13 DIAGNOSIS — E1122 Type 2 diabetes mellitus with diabetic chronic kidney disease: Secondary | ICD-10-CM | POA: Diagnosis present

## 2017-05-13 DIAGNOSIS — D631 Anemia in chronic kidney disease: Secondary | ICD-10-CM | POA: Diagnosis present

## 2017-05-13 DIAGNOSIS — Z955 Presence of coronary angioplasty implant and graft: Secondary | ICD-10-CM

## 2017-05-13 DIAGNOSIS — Z7902 Long term (current) use of antithrombotics/antiplatelets: Secondary | ICD-10-CM

## 2017-05-13 DIAGNOSIS — N2581 Secondary hyperparathyroidism of renal origin: Secondary | ICD-10-CM | POA: Diagnosis present

## 2017-05-13 DIAGNOSIS — Z7982 Long term (current) use of aspirin: Secondary | ICD-10-CM

## 2017-05-13 DIAGNOSIS — R45851 Suicidal ideations: Secondary | ICD-10-CM

## 2017-05-13 DIAGNOSIS — I2510 Atherosclerotic heart disease of native coronary artery without angina pectoris: Secondary | ICD-10-CM | POA: Diagnosis present

## 2017-05-13 DIAGNOSIS — I10 Essential (primary) hypertension: Secondary | ICD-10-CM

## 2017-05-13 DIAGNOSIS — N186 End stage renal disease: Secondary | ICD-10-CM

## 2017-05-13 DIAGNOSIS — Z992 Dependence on renal dialysis: Secondary | ICD-10-CM | POA: Diagnosis not present

## 2017-05-13 DIAGNOSIS — I252 Old myocardial infarction: Secondary | ICD-10-CM

## 2017-05-13 DIAGNOSIS — Z841 Family history of disorders of kidney and ureter: Secondary | ICD-10-CM

## 2017-05-13 DIAGNOSIS — I120 Hypertensive chronic kidney disease with stage 5 chronic kidney disease or end stage renal disease: Secondary | ICD-10-CM | POA: Diagnosis present

## 2017-05-13 DIAGNOSIS — Z87891 Personal history of nicotine dependence: Secondary | ICD-10-CM

## 2017-05-13 DIAGNOSIS — Z8249 Family history of ischemic heart disease and other diseases of the circulatory system: Secondary | ICD-10-CM

## 2017-05-13 DIAGNOSIS — K219 Gastro-esophageal reflux disease without esophagitis: Secondary | ICD-10-CM | POA: Diagnosis present

## 2017-05-13 DIAGNOSIS — I953 Hypotension of hemodialysis: Secondary | ICD-10-CM | POA: Diagnosis not present

## 2017-05-13 DIAGNOSIS — R079 Chest pain, unspecified: Secondary | ICD-10-CM | POA: Diagnosis present

## 2017-05-13 DIAGNOSIS — R011 Cardiac murmur, unspecified: Secondary | ICD-10-CM | POA: Diagnosis present

## 2017-05-13 DIAGNOSIS — M1990 Unspecified osteoarthritis, unspecified site: Secondary | ICD-10-CM | POA: Diagnosis present

## 2017-05-13 DIAGNOSIS — T82858A Stenosis of vascular prosthetic devices, implants and grafts, initial encounter: Secondary | ICD-10-CM | POA: Diagnosis not present

## 2017-05-13 DIAGNOSIS — R0789 Other chest pain: Principal | ICD-10-CM | POA: Diagnosis present

## 2017-05-13 DIAGNOSIS — Z86718 Personal history of other venous thrombosis and embolism: Secondary | ICD-10-CM

## 2017-05-13 DIAGNOSIS — Z880 Allergy status to penicillin: Secondary | ICD-10-CM

## 2017-05-13 DIAGNOSIS — I081 Rheumatic disorders of both mitral and tricuspid valves: Secondary | ICD-10-CM | POA: Diagnosis present

## 2017-05-13 DIAGNOSIS — T82898A Other specified complication of vascular prosthetic devices, implants and grafts, initial encounter: Secondary | ICD-10-CM

## 2017-05-13 LAB — PROTIME-INR
INR: 0.98
PROTHROMBIN TIME: 12.9 s (ref 11.4–15.2)

## 2017-05-13 LAB — I-STAT CHEM 8, ED
BUN: 16 mg/dL (ref 6–20)
Calcium, Ion: 0.9 mmol/L — ABNORMAL LOW (ref 1.15–1.40)
Chloride: 94 mmol/L — ABNORMAL LOW (ref 101–111)
Creatinine, Ser: 4.1 mg/dL — ABNORMAL HIGH (ref 0.61–1.24)
Glucose, Bld: 95 mg/dL (ref 65–99)
HEMATOCRIT: 34 % — AB (ref 39.0–52.0)
Hemoglobin: 11.6 g/dL — ABNORMAL LOW (ref 13.0–17.0)
POTASSIUM: 2.8 mmol/L — AB (ref 3.5–5.1)
SODIUM: 138 mmol/L (ref 135–145)
TCO2: 29 mmol/L (ref 22–32)

## 2017-05-13 LAB — BRAIN NATRIURETIC PEPTIDE: B Natriuretic Peptide: 28.1 pg/mL (ref 0.0–100.0)

## 2017-05-13 LAB — CBC
HEMATOCRIT: 31.8 % — AB (ref 39.0–52.0)
HEMOGLOBIN: 10.3 g/dL — AB (ref 13.0–17.0)
MCH: 28.6 pg (ref 26.0–34.0)
MCHC: 32.4 g/dL (ref 30.0–36.0)
MCV: 88.3 fL (ref 78.0–100.0)
Platelets: 303 10*3/uL (ref 150–400)
RBC: 3.6 MIL/uL — AB (ref 4.22–5.81)
RDW: 13.5 % (ref 11.5–15.5)
WBC: 6.6 10*3/uL (ref 4.0–10.5)

## 2017-05-13 LAB — CBG MONITORING, ED: Glucose-Capillary: 96 mg/dL (ref 65–99)

## 2017-05-13 LAB — TROPONIN I: Troponin I: <0.03 ng/mL (ref <0.03)

## 2017-05-13 LAB — D-DIMER, QUANTITATIVE (NOT AT ARMC): D DIMER QUANT: 4.23 ug{FEU}/mL — AB (ref 0.00–0.50)

## 2017-05-13 LAB — I-STAT TROPONIN, ED: Troponin i, poc: 0.01 ng/mL (ref 0.00–0.08)

## 2017-05-13 LAB — MAGNESIUM: Magnesium: 1.8 mg/dL (ref 1.7–2.4)

## 2017-05-13 MED ORDER — OMEPRAZOLE MAGNESIUM 20 MG PO TBEC: 20.0000 mg | DELAYED_RELEASE_TABLET | Freq: Every day | ORAL | Status: DC

## 2017-05-13 MED ORDER — HEPARIN SODIUM (PORCINE) 5000 UNIT/ML IJ SOLN: 5000.0000 [IU] | Freq: Three times a day (TID) | INTRAMUSCULAR | Status: DC

## 2017-05-13 MED ORDER — HYDRALAZINE HCL 20 MG/ML IJ SOLN: 5.0000 mg | INTRAMUSCULAR | Status: DC | PRN

## 2017-05-13 MED ORDER — NITROGLYCERIN 0.4 MG SL SUBL
0.4000 mg | SUBLINGUAL_TABLET | SUBLINGUAL | Status: DC | PRN
  Administered 2017-05-14: 0.4 mg via SUBLINGUAL
  Filled 2017-05-13: qty 1

## 2017-05-13 MED ORDER — OXYCODONE-ACETAMINOPHEN 5-325 MG PO TABS
1.0000 | ORAL_TABLET | Freq: Four times a day (QID) | ORAL | Status: DC | PRN
  Administered 2017-05-14: 1 via ORAL
  Filled 2017-05-13: qty 1

## 2017-05-13 MED ORDER — ONDANSETRON HCL 4 MG/2ML IJ SOLN
4.0000 mg | Freq: Four times a day (QID) | INTRAMUSCULAR | Status: DC | PRN
  Filled 2017-05-13: qty 2

## 2017-05-13 MED ORDER — PANTOPRAZOLE SODIUM 40 MG PO TBEC
40.0000 mg | DELAYED_RELEASE_TABLET | Freq: Every day | ORAL | Status: DC
  Administered 2017-05-13 – 2017-05-21 (×9): 40 mg via ORAL
  Filled 2017-05-13 (×9): qty 1

## 2017-05-13 MED ORDER — METOPROLOL TARTRATE 50 MG PO TABS
50.0000 mg | ORAL_TABLET | Freq: Two times a day (BID) | ORAL | Status: DC
  Administered 2017-05-13 – 2017-05-21 (×13): 50 mg via ORAL
  Filled 2017-05-13: qty 2
  Filled 2017-05-13: qty 1
  Filled 2017-05-13: qty 2
  Filled 2017-05-13 (×2): qty 1
  Filled 2017-05-13 (×2): qty 2
  Filled 2017-05-13 (×2): qty 1
  Filled 2017-05-13: qty 2
  Filled 2017-05-13 (×3): qty 1

## 2017-05-13 MED ORDER — METOPROLOL TARTRATE 25 MG PO TABS: 50.0000 mg | ORAL_TABLET | Freq: Two times a day (BID) | ORAL | Status: DC

## 2017-05-13 MED ORDER — ISOSORBIDE MONONITRATE ER 30 MG PO TB24
30.0000 mg | ORAL_TABLET | Freq: Every day | ORAL | Status: DC
  Administered 2017-05-14 – 2017-05-21 (×8): 30 mg via ORAL
  Filled 2017-05-13 (×8): qty 1

## 2017-05-13 MED ORDER — MORPHINE SULFATE (PF) 4 MG/ML IV SOLN
2.0000 mg | INTRAVENOUS | Status: DC | PRN
  Administered 2017-05-14: 2 mg via INTRAVENOUS
  Filled 2017-05-13: qty 1

## 2017-05-13 MED ORDER — ZOLPIDEM TARTRATE 5 MG PO TABS
5.0000 mg | ORAL_TABLET | Freq: Every evening | ORAL | Status: DC | PRN
  Administered 2017-05-20: 5 mg via ORAL
  Filled 2017-05-13: qty 1

## 2017-05-13 MED ORDER — HEPARIN (PORCINE) IN NACL 100-0.45 UNIT/ML-% IJ SOLN
1200.0000 [IU]/h | INTRAMUSCULAR | Status: DC
  Administered 2017-05-13: 1150 [IU]/h via INTRAVENOUS
  Filled 2017-05-13: qty 250

## 2017-05-13 MED ORDER — HEPARIN BOLUS VIA INFUSION
6000.0000 [IU] | Freq: Once | INTRAVENOUS | Status: AC
  Administered 2017-05-13: 6000 [IU] via INTRAVENOUS
  Filled 2017-05-13: qty 6000

## 2017-05-13 MED ORDER — ATORVASTATIN CALCIUM 80 MG PO TABS
80.0000 mg | ORAL_TABLET | Freq: Every day | ORAL | Status: DC
  Administered 2017-05-13 – 2017-05-20 (×8): 80 mg via ORAL
  Filled 2017-05-13 (×8): qty 1

## 2017-05-13 MED ORDER — POTASSIUM CHLORIDE 20 MEQ/15ML (10%) PO SOLN
40.0000 meq | Freq: Once | ORAL | Status: AC
  Administered 2017-05-13: 40 meq via ORAL
  Filled 2017-05-13: qty 30

## 2017-05-13 MED ORDER — ASPIRIN 81 MG PO CHEW
324.0000 mg | CHEWABLE_TABLET | Freq: Once | ORAL | Status: AC
  Administered 2017-05-13: 324 mg via ORAL
  Filled 2017-05-13: qty 4

## 2017-05-13 MED ORDER — ACETAMINOPHEN 325 MG PO TABS: 650.0000 mg | ORAL_TABLET | ORAL | Status: DC | PRN

## 2017-05-13 MED ORDER — ASPIRIN EC 81 MG PO TBEC
81.0000 mg | DELAYED_RELEASE_TABLET | Freq: Every day | ORAL | Status: DC
  Administered 2017-05-14 – 2017-05-21 (×8): 81 mg via ORAL
  Filled 2017-05-13 (×8): qty 1

## 2017-05-13 MED ORDER — LATANOPROST 0.005 % OP SOLN
1.0000 [drp] | Freq: Every day | OPHTHALMIC | Status: DC
Start: 2017-05-13 — End: 2017-05-21
  Administered 2017-05-13 – 2017-05-20 (×8): 1 [drp] via OPHTHALMIC
  Filled 2017-05-13: qty 2.5

## 2017-05-13 MED ORDER — ALPRAZOLAM 0.25 MG PO TABS: 0.2500 mg | ORAL_TABLET | Freq: Two times a day (BID) | ORAL | Status: DC | PRN

## 2017-05-13 MED ORDER — AMLODIPINE BESYLATE 10 MG PO TABS
10.0000 mg | ORAL_TABLET | Freq: Every day | ORAL | Status: DC
  Administered 2017-05-14 – 2017-05-18 (×5): 10 mg via ORAL
  Filled 2017-05-13: qty 1
  Filled 2017-05-13 (×2): qty 2
  Filled 2017-05-13 (×2): qty 1

## 2017-05-13 MED ORDER — CLOPIDOGREL BISULFATE 75 MG PO TABS
75.0000 mg | ORAL_TABLET | Freq: Every day | ORAL | Status: DC
  Administered 2017-05-14 – 2017-05-21 (×8): 75 mg via ORAL
  Filled 2017-05-13 (×8): qty 1

## 2017-05-13 NOTE — ED Notes (Signed)
Attempted to call report at this time 

## 2017-05-13 NOTE — ED Notes (Signed)
Pt given a urinal to obtain urine sample. Asked to push call light when he has gone.

## 2017-05-13 NOTE — ED Provider Notes (Signed)
[facility] EMERGENCY DEPARTMENT Provider Note   CSN: [id number] Arrival date & time: 05/13/17  1749     History   Chief Complaint Chief Complaint  Patient presents with  . Chest Pain    HPI Richard Barnett is a 62 y.o. Richard Barnett.  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  Patient denies any shortness of breath or syncope.  This is new for the patient.  Patient denies any recent illnesses/fever/cough, abdominal pain, nausea vomiting or diarrhea.  Past Medical History:  Diagnosis Date  . Arthritis    "right leg" (08/22/2014)  . CAD (coronary artery disease)    a. CAD s/p 2 stent placement in [place] 2016. b. Neg nuc 05/2014 & 01/2016.  Marland Kitchen Chest pain 01/2017  . CKD (chronic kidney disease), stage IV (HCC)    Richard Barnett 08/22/2014  . Daily headache   . Depression    "I always get that" (08/22/2014)  . Diabetes mellitus (HCC)   . DVT (deep venous thrombosis) (HCC) ?   RLE  . GERD (gastroesophageal reflux disease)   . Heart murmur   . Hypercholesterolemia   . Hypertension   . Myocardial infarction (HCC)   . Wears glasses     Patient Active Problem List   Diagnosis Date Noted  . NSTEMI (non-ST elevated myocardial infarction) (HCC) 01/27/2017  . Diabetes mellitus (HCC)   . Anemia of chronic renal failure   . Secondary hyperparathyroidism of renal origin (HCC)   . Proteinuria   . Arthritis   . Depression   . Deep vein thrombosis (HCC)   . Myocardial infarction (HCC)   . Heart failure, unspecified (HCC)   . Heart murmur   . Prediabetes 02/22/2015  . Chronic kidney disease, stage IV (severe)  (HCC)   . Hyperlipidemia 05/22/2014  . Chest pain 05/21/2014  . Hypokalemia 05/21/2014  . CAD (coronary artery disease) 05/21/2014  . Hypertension 05/21/2014    Past Surgical History:  Procedure Laterality Date  . AV FISTULA PLACEMENT Left 10/01/2016   Procedure: INSERTION OF 4-7MM X 45CM ARTERIOVENOUS (AV) GORE-TEX GRAFT ARM;  Surgeon: Chuck Hint, MD;  Location: [facility] OR;  Service: Vascular;  Laterality: Left;  . CORONARY ANGIOPLASTY WITH STENT PLACEMENT     "1 + 1"  . DRAINAGE AND CLOSURE OF LYMPHOCELE Left 10/29/2016   Procedure: EVACUATION OF LYMPHOCELE LEFT ARM ARTERIOVENOUS GRAFT;  Surgeon: Chuck Hint, MD;  Location: [facility] OR;  Service: Vascular;  Laterality: Left;  . LEFT HEART CATH AND CORONARY ANGIOGRAPHY N/A 01/14/2017   Procedure: LEFT HEART CATH AND CORONARY ANGIOGRAPHY;  Surgeon: Swazil[place], Peter M, MD;  Location: [facility] INVASIVE CV LAB;  Service: Cardiovascular;  Laterality: N/A;       Home Medications    Prior to Admission medications   Medication Sig Start Date End Date Taking? Authorizing Provider  amLODipine (NORVASC) 10 MG tablet Take 1 tablet (10 mg total) by mouth daily. 01/30/17 03/01/17  Lorenso Courier, MD  aspirin 81 MG EC tablet Take 1 tablet (81 mg total) by mouth daily. Swallow whole. Patient not taking: Reported on 01/27/2017 02/10/16   Massie Maroon, FNP  atorvastatin (LIPITOR) 80 MG tablet Take 1 tablet (80 mg  total) by mouth daily at 6 PM. 01/29/17   Chundi, Vahini, MD  Brinzolamide-Brimonidine [facility]) 1-0.2 % SUSP Place 1 drop into both eyes 2 (two) times daily.    [provider]  clopidogrel (PLAVIX) 75 MG tablet Take 1 tablet (75 mg total) by mouth daily. 01/30/17   Lorenso Courier, MD  isosorbide mononitrate (IMDUR) 30 MG 24 hr tablet Take 1 tablet (30 mg total) by mouth daily. 01/29/17 02/28/17  Chundi, Sherlyn Lees, MD  latanoprost (XALATAN) 0.005 % ophthalmic solution Place 1 drop into both eyes at bedtime.    [provider]  metoprolol tartrate (LOPRESSOR) 50 MG tablet Take 1 tablet (50 mg total) by mouth 2 (two) times daily with a meal. 01/29/17 02/28/17  Chundi, Sherlyn Lees, MD  nitroGLYCERIN (NITROSTAT) 0.4 MG SL tablet DISSOLVE 1 TABLET UNDER THE TONGUE AS NEEDED FOR CHEST PAIN. REPEAT AS NEEDED EVERY 5 MINUTES UP TO A TOTAL OF 3 DOSES Patient taking differently: DISSOLVE 1 TABLET (0.4 MG) UNDER THE TONGUE AS NEEDED FOR CHEST PAIN. REPEAT AS NEEDED EVERY 5 MINUTES UP TO A TOTAL OF 3 DOSES 12/15/15   Henrietta Hoover, NP  omeprazole (PRILOSEC OTC) 20 MG tablet Take 1 tablet (20 mg total) by mouth daily. Patient not taking: Reported on 01/27/2017 08/11/16 08/11/17  Thomasene Lot, MD  oxyCODONE-acetaminophen (PERCOCET/ROXICET) 5-325 MG tablet Take 1 tablet by mouth every 6 (six) hours as needed. Patient not taking: Reported on 01/27/2017 10/29/16   Lars Mage, PA-C    Family History Family History  Problem Relation Age of Onset  . Hypertension Mother   . Kidney disease Mother   . Heart disease Mother   . Heart disease Father   . Hypertension Father     Social History Social History   Tobacco Use  . Smoking status: Former Smoker    Packs/day: 0.00    Years: 40.00    Pack years: 0.00    Types: Cigarettes    Last attempt to quit: 08/29/2010    Years since quitting: 6.7  . Smokeless tobacco: Never Used  . Tobacco comment: "quit smoking cigarettes in  2011" 10/27/16  Substance Use Topics  . Alcohol use: No    Comment: 10/27/16 "stopped in ~ 2011"  . Drug use: No    Comment: 10/27/16 "stopped in ~ 2011; all types of drugs""     Allergies   Penicillins   Review of Systems Review of Systems  Review of Systems  Constitutional: Negative for fever and chills.  HENT: Negative for ear pain, sore throat and trouble swallowing.   Eyes: Negative for pain and visual disturbance.  Respiratory: Negative for cough and shortness of breath.   Cardiovascular: see HPI Gastrointestinal: Negative for  nausea, vomiting, abdominal pain and diarrhea.  Genitourinary: Negative for dysuria, urgency and frequency.  Musculoskeletal: Negative for back pain and joint swelling.  Skin: Negative for rash and wound.  Neurological: Negative for dizziness, syncope, speech difficulty, weakness and numbness.   Physical Exam Updated Vital Signs BP (!) 140/112   Pulse 73   Temp 98.6 F (37 C) (Oral)   Resp 19   SpO2 100%   Physical Exam  Physical Exam Vitals:   05/13/17 1815 05/13/17 1915  BP: (!) 140/112   Pulse: 73   Resp: 19   Temp:  98.6 F (37 C)  SpO2: 100%    Constitutional: Patient is in no acute distress Head: Normocephalic and atraumatic.  Eyes: Extraocular motion intact, no scleral icterus Neck: Supple without  meningismus, mass, or overt JVD Respiratory: Effort normal and breath sounds normal. No respiratory distress. CV: Heart regular rate and rhythm, no obvious murmurs.  Pulses +2 and symmetric Abdomen: Soft, non-tender, non-distended MSK: Extremities are atraumatic without deformity, ROM intact Skin: Warm, dry, intact Neuro: Alert and oriented, no motor deficit noted Psychiatric: Mood and affect are normal.  ED Treatments / Results  Labs (all labs ordered are listed, but only abnormal results are displayed) Labs Reviewed  CBC - Abnormal; Notable for the following components:      Result Value   RBC 3.60 (*)    Hemoglobin 10.3 (*)    HCT 31.8 (*)    All other components within normal limits  I-STAT CHEM 8, ED - Abnormal; Notable for the following components:   Potassium 2.8 (*)    Chloride 94 (*)    Creatinine, Ser 4.10 (*)    Calcium, Ion 0.90 (*)    Hemoglobin 11.6 (*)    HCT 34.0 (*)    All other components within normal limits  PROTIME-INR  BRAIN NATRIURETIC PEPTIDE  MAGNESIUM  URINALYSIS, ROUTINE W REFLEX MICROSCOPIC  I-STAT TROPONIN, ED  CBG MONITORING, ED    EKG  EKG Interpretation None     EKG without ACS findings, neg interval change from  prior.   Radiology Dg Chest Portable 1 View  Result Date: 05/13/2017 CLINICAL DATA:  Pt here from dialysis with c/o central chest pain and dizziness. Pt did all but 30 mins of his dialysis. Denies SOB. Hx of HTN, diabetes, and CAD. Former smoker- quit 2012. EXAM: PORTABLE CHEST 1 VIEW COMPARISON:  04/07/2017 FINDINGS: Cardiac silhouette is normal in size. No mediastinal or hilar masses. No convincing adenopathy. Clear lungs.  No pleural effusion or pneumothorax. Skeletal structures are grossly intact. IMPRESSION: No active disease. Electronically Signed   By: Amie Portland M.D.   On: 05/13/2017 18:54    Procedures Procedures (including critical care time)  Medications Ordered in ED Medications  aspirin chewable tablet 324 mg (324 mg Oral Given 05/13/17 1854)     Initial Impression / Assessment and Plan / ED Course  I have reviewed the triage vital signs and the nursing notes.  Pertinent labs & imaging results that were available during my care of the patient were reviewed by me and considered in my medical decision making (see chart for details).     62 year old Damonie history of PCI x2 2016/coronary artery disease compliant with his Plavix, chronic kidney disease stage IV does dialysis on Monday Wednesday Fridays, hypertension, hyperlipidemia, diabetes presents the emergency department from his dialysis facility after having 3.5 hours of his session complete when he had some lightheadedness along with substernal chest discomfort described as pressure nonradiating.  Patient denies any shortness of breath or syncope.  This is new for the patient.  Patient denies any recent illnesses/fever/cough, abdominal pain, nausea vomiting or diarrhea.  Patient arrives hemodynamically stable well-appearing.  Physical exam as annotated above.  Patient is currently chest pain-free.  Review of labs shows no evidence of leukocytosis, stable H&H, troponin x1 at 0.01 and hypokalemia 2.8.  This patient had a prior  admission back in November 2018 with a left heart cath done with known coronary artery disease to 2 vessels that was deemed to be medically managed.  EKG reviewed with no interval change from prior EKG.  Admit hospitalist for eval/obs.   Final Clinical Impressions(s) / ED Diagnoses   Final diagnoses:  Chest pain, unspecified type  Hypokalemia  ED Discharge Orders    None       Jaynie Collins, DO 05/13/17 1940    Alvira Monday, MD 05/14/17 1252

## 2017-05-13 NOTE — H&P (Signed)
History and Physical    [name]:[id number] DOB: Jun 17, 1955 DOA: 05/13/2017  Referring MD/NP/PA:   PCP: Patient, No Pcp Per   Patient coming from:  The patient is coming from home.  At baseline, pt is independent for most of ADL.   Chief Complaint: Chest pain  HPI: Richard Barnett is a 62 y.o. [name] with medical history significant of ESRD-HD (MWF, justed started in 04/2017), hypertension, hyperlipidemia, diet-controlled diabetes, CAD, stent placement 01/2017, GERD, depression, DVT, who presents with chest pain.   Pt is from dialysis center. He states that he started having chest pain during dialysis (finished 3.5 hour course of HD). IT is located in the substernal area, initially 10 out of 10 in severity, now is chest pain free, pressure-like, nonradiating. It is associated with dizziness and SOB. He does not have cough, fever or chills. Patient denies nausea, vomiting, diarrhea, abdominal pain, symptoms of UTI or unilateral weakness.  ED Course: pt was found to have negative troponin, INR 0.98, lipase is 6.6, potassium 2.8, bicarbonate 29, creatinine 4.10, BUN 16, temperature normal, no tachycardia, no tachypnea, oxygen saturation suspension 100% on room air. Negative chest x-ray. Patient is placed on telemetry bed for observation.  Review of Systems:   General: no fevers, chills, no body weight gain, has fatigue HEENT: no blurry vision, hearing changes or sore throat Respiratory: has dyspnea, coughing, no wheezing CV: has chest pain, no palpitations GI: no nausea, vomiting, abdominal pain, diarrhea, constipation GU: no dysuria, burning on urination, increased urinary frequency, hematuria  Ext: no leg edema Neuro: no unilateral weakness, numbness, or tingling, no vision change or hearing loss Skin: no rash, no skin tear. MSK: No muscle spasm, no deformity, no limitation of range of movement in spin Heme: No easy bruising.  Travel history: No recent long distant  travel. Psychiatry: Patient states that he has suicidal ideation, but no plan  Allergy:  Allergies  Allergen Reactions  . Penicillins Swelling and Rash    ANGIOEDEMA "SWELLING OF ENTIRE BODY"  Has patient had a PCN reaction causing immediate rash, facial/tongue/throat swelling, SOB or lightheadedness with hypotension: No Has patient had a PCN reaction causing severe rash involving mucus membranes or skin necrosis: No Has patient had a PCN reaction that required hospitalization No Has patient had a PCN reaction occurring within the last 10 years: No If all of the above answers are "NO", then may proceed with Cephalosporin use.     Past Medical History:  Diagnosis Date  . Arthritis    "right leg" (08/22/2014)  . CAD (coronary artery disease)    a. CAD s/p 2 stent placement in [place] 2016. b. Neg nuc 05/2014 & 01/2016.  Marland Kitchen Chest pain 01/2017  . CKD (chronic kidney disease), stage IV (HCC)    [name] 08/22/2014  . Daily headache   . Depression    "I always get that" (08/22/2014)  . Diabetes mellitus (HCC)   . DVT (deep venous thrombosis) (HCC) ?   RLE  . GERD (gastroesophageal reflux disease)   . Heart murmur   . Hypercholesterolemia   . Hypertension   . Myocardial infarction (HCC)   . Wears glasses     Past Surgical History:  Procedure Laterality Date  . AV FISTULA PLACEMENT Left 10/01/2016   Procedure: INSERTION OF 4-7MM X 45CM ARTERIOVENOUS (AV) GORE-TEX GRAFT ARM;  Surgeon: Chuck Hint, MD;  Location: [facility] OR;  Service: Vascular;  Laterality: Left;  . CORONARY ANGIOPLASTY WITH STENT PLACEMENT     "  1 + 1"  . DRAINAGE AND CLOSURE OF LYMPHOCELE Left 10/29/2016   Procedure: EVACUATION OF LYMPHOCELE LEFT ARM ARTERIOVENOUS GRAFT;  Surgeon: Chuck Hint, MD;  Location: [facility] OR;  Service: Vascular;  Laterality: Left;  . LEFT HEART CATH AND CORONARY ANGIOGRAPHY N/A 01/14/2017   Procedure: LEFT HEART CATH AND CORONARY ANGIOGRAPHY;  Surgeon: Swaziland, Peter M, MD;   Location: [facility] INVASIVE CV LAB;  Service: Cardiovascular;  Laterality: N/A;    Social History:  reports that he quit smoking about 6 years ago. His smoking use included cigarettes. He smoked 0.00 packs per day for 40.00 years. he has never used smokeless tobacco. He reports that he does not drink alcohol or use drugs.  Family History:  Family History  Problem Relation Age of Onset  . Hypertension Mother   . Kidney disease Mother   . Heart disease Mother   . Heart disease Father   . Hypertension Father      Prior to Admission medications   Medication Sig Start Date End Date Taking? Authorizing Provider  amLODipine (NORVASC) 10 MG tablet Take 1 tablet (10 mg total) by mouth daily. 01/30/17 03/01/17  Lorenso Courier, MD  aspirin 81 MG EC tablet Take 1 tablet (81 mg total) by mouth daily. Swallow whole. Patient not taking: Reported on 01/27/2017 02/10/16   Massie Maroon, FNP  atorvastatin (LIPITOR) 80 MG tablet Take 1 tablet (80 mg total) by mouth daily at 6 PM. 01/29/17   Chundi, Vahini, MD  Brinzolamide-Brimonidine [facility]) 1-0.2 % SUSP Place 1 drop into both eyes 2 (two) times daily.    [provider]  clopidogrel (PLAVIX) 75 MG tablet Take 1 tablet (75 mg total) by mouth daily. 01/30/17   Lorenso Courier, MD  isosorbide mononitrate (IMDUR) 30 MG 24 hr tablet Take 1 tablet (30 mg total) by mouth daily. 01/29/17 02/28/17  Chundi, Sherlyn Lees, MD  latanoprost (XALATAN) 0.005 % ophthalmic solution Place 1 drop into both eyes at bedtime.    [provider]  metoprolol tartrate (LOPRESSOR) 50 MG tablet Take 1 tablet (50 mg total) by mouth 2 (two) times daily with a meal. 01/29/17 02/28/17  Chundi, Sherlyn Lees, MD  nitroGLYCERIN (NITROSTAT) 0.4 MG SL tablet DISSOLVE 1 TABLET UNDER THE TONGUE AS NEEDED FOR CHEST PAIN. REPEAT AS NEEDED EVERY 5 MINUTES UP TO A TOTAL OF 3 DOSES Patient taking differently: DISSOLVE 1 TABLET (0.4 MG) UNDER THE TONGUE AS NEEDED FOR CHEST PAIN. REPEAT AS  NEEDED EVERY 5 MINUTES UP TO A TOTAL OF 3 DOSES 12/15/15   Henrietta Hoover, NP  omeprazole (PRILOSEC OTC) 20 MG tablet Take 1 tablet (20 mg total) by mouth daily. Patient not taking: Reported on 01/27/2017 08/11/16 08/11/17  Thomasene Lot, MD  oxyCODONE-acetaminophen (PERCOCET/ROXICET) 5-325 MG tablet Take 1 tablet by mouth every 6 (six) hours as needed. Patient not taking: Reported on 01/27/2017 10/29/16   Lars Mage, PA-C    Physical Exam: Vitals:   05/13/17 1915 05/13/17 1945 05/13/17 2030 05/13/17 2054  BP:  (!) 137/124 133/89 (!) 153/80  Pulse:  79 86 92  Resp:   15 16  Temp: 98.6 F (37 C)   (!) 97.5 F (36.4 C)  TempSrc: Oral   Oral  SpO2:  100% 100% 100%  Weight:    100.6 kg (221 lb 11.2 oz)   General: Not in acute distress HEENT:       Eyes: PERRL, EOMI, no scleral icterus.       ENT: No discharge from  the ears and nose, no pharynx injection, no tonsillar enlargement.        Neck: No JVD, no bruit, no mass felt. Heme: No neck lymph node enlargement. Cardiac: S1/S2, 1/6 systolic murmur, No murmurs, No gallops or rubs. Respiratory: No rales, wheezing, rhonchi or rubs. GI: Soft, nondistended, nontender, no rebound pain, no organomegaly, BS present. GU: No hematuria Ext: No pitting leg edema bilaterally. 2+DP/PT pulse bilaterally. Musculoskeletal: No joint deformities, No joint redness or warmth, no limitation of ROM in spin. Skin: No rashes.  Neuro: Alert, oriented X3, cranial nerves II-XII grossly intact, moves all extremities normally.  Psych: Psychiatry: Patient states that he has suicidal ideation, but no plan   Labs on Admission: I have personally reviewed following labs and imaging studies  CBC: Recent Labs  Lab 05/13/17 1845 05/13/17 1850  WBC 6.6  --   HGB 10.3* 11.6*  HCT 31.8* 34.0*  MCV 88.3  --   PLT 303  --    Basic Metabolic Panel: Recent Labs  Lab 05/13/17 1845 05/13/17 1850  NA  --  138  K  --  2.8*  CL  --  94*  GLUCOSE  --  95  BUN   --  16  CREATININE  --  4.10*  MG 1.8  --    GFR: Estimated Creatinine Clearance: 21.1 mL/min (A) (by C-G formula based on SCr of 4.1 mg/dL (H)). Liver Function Tests: No results for input(s): AST, ALT, ALKPHOS, BILITOT, PROT, ALBUMIN in the last 168 hours. No results for input(s): LIPASE, AMYLASE in the last 168 hours. No results for input(s): AMMONIA in the last 168 hours. Coagulation Profile: Recent Labs  Lab 05/13/17 1845  INR 0.98   Cardiac Enzymes: Recent Labs  Lab 05/13/17 2000  TROPONINI <0.03   BNP (last 3 results) No results for input(s): PROBNP in the last 8760 hours. HbA1C: No results for input(s): HGBA1C in the last 72 hours. CBG: Recent Labs  Lab 05/13/17 1930  GLUCAP 96   Lipid Profile: No results for input(s): CHOL, HDL, LDLCALC, TRIG, CHOLHDL, LDLDIRECT in the last 72 hours. Thyroid Function Tests: No results for input(s): TSH, T4TOTAL, FREET4, T3FREE, THYROIDAB in the last 72 hours. Anemia Panel: No results for input(s): VITAMINB12, FOLATE, FERRITIN, TIBC, IRON, RETICCTPCT in the last 72 hours. Urine analysis:    Component Value Date/Time   COLORURINE YELLOW 04/07/2017 0301   APPEARANCEUR CLEAR 04/07/2017 0301   LABSPEC 1.013 04/07/2017 0301   PHURINE 6.0 04/07/2017 0301   GLUCOSEU 150 (A) 04/07/2017 0301   HGBUR MODERATE (A) 04/07/2017 0301   BILIRUBINUR NEGATIVE 04/07/2017 0301   KETONESUR NEGATIVE 04/07/2017 0301   PROTEINUR >=300 (A) 04/07/2017 0301   UROBILINOGEN 0.2 02/21/2015 0955   NITRITE NEGATIVE 04/07/2017 0301   LEUKOCYTESUR NEGATIVE 04/07/2017 0301   Sepsis Labs: @LABRCNTIP (procalcitonin:4,lacticidven:4) )No results found for this or any previous visit (from the past 240 hour(s)).   Radiological Exams on Admission: Dg Chest Portable 1 View  Result Date: 05/13/2017 CLINICAL DATA:  Pt here from dialysis with c/o central chest pain and dizziness. Pt did all but 30 mins of his dialysis. Denies SOB. Hx of HTN, diabetes, and CAD.  Former smoker- quit 2012. EXAM: PORTABLE CHEST 1 VIEW COMPARISON:  04/07/2017 FINDINGS: Cardiac silhouette is normal in size. No mediastinal or hilar masses. No convincing adenopathy. Clear lungs.  No pleural effusion or pneumothorax. Skeletal structures are grossly intact. IMPRESSION: No active disease. Electronically Signed   By: Amie Portland M.D.   On:  05/13/2017 18:54     EKG: Independently reviewed. Sinus rhythm, QTC 481, T-wave inversion in inferior leads and V5 to V6, which is similar in pattern, but worsened than previous EKG.   Assessment/Plan Principal Problem:   Chest pain Active Problems:   Hypokalemia   CAD (coronary artery disease)   Hypertension   Hyperlipidemia   GERD (gastroesophageal reflux disease)   ESRD on dialysis (HCC)   Suicidal ideation   Chest pain and hx of CAD: pt has significant cardiac history, including CAD, myocardial infarction, stent placement, hypertension, hyperlipidemia, diet-controlled diabetes, will need to rule out ACS. Initial trop negative. He has hx of R DVT. He is at risk of getting PE. His D-dimer is positive 4.23. Cannot do CAT since he just started HD last month.  - will place on Tele bed for obs - start IV heparin for possible PE - f/u V/Q in aM - cycle CE q6 x3 and repeat EKG in the am  - prn Nitroglycerin, Morphine, and aspirin, lipitor, metoprolol, imdur - Risk factor stratification: will check FLP, UDS and A1C  - 2d echo - LE doppler to r/o DVT - Inpatient non-urgent card consult order was put in Epic and message to Daun Peacock was sent out.  Hypokalemia: K=2.8  on admission. - Repleted - Check Mg level  HTN:  -Continue home medications: Amlodipine, metoprolol, -IV hydralazine prn  HLD: -lipitor  GERD: -Protonix  ESRD (end stage renal disease) on dialysis (MWF): potassium 2.8, bicarbonate 29, creatinine 4.10, BUN 16 -Left message to renal box for HD  Suicidal ideation: no plan -Suicidal precaution -Seizure and  bedside -Psychiatry consult request was sent out via Epic (I am not sure if this will work)    DVT ppx: on IV Heparin    Code Status: Full code Family Communication: None at bed side.     Disposition Plan:  Anticipate discharge back to previous home environment Consults called:  none Admission status: Obs / tele    Date of Service 05/13/2017    Lorretta Harp [facility] Pager [phone]  If 7PM-7AM, please contact night-coverage [email or web address] Password [facility] 05/13/2017, 9:28 PM

## 2017-05-13 NOTE — ED Triage Notes (Signed)
Pt here from dialysis with c/o chest pain  And dizziness pt did all but 30 mins of his dialysis , no sob

## 2017-05-13 NOTE — ED Notes (Signed)
Patient is stable and ready to be transport to the floor at this time.  Report was called to 6E RN.  Belongings taken with the patient to the floor.   

## 2017-05-13 NOTE — ED Notes (Signed)
Pt given happy meal and some water.

## 2017-05-13 NOTE — Progress Notes (Signed)
ANTICOAGULATION CONSULT NOTE - Initial Consult  Pharmacy Consult for Heparin Indication: Rule out Pulmonary embolism with history of DVT  Allergies  Allergen Reactions  . Penicillins Swelling and Rash    ANGIOEDEMA "SWELLING OF ENTIRE BODY"  Has patient had a PCN reaction causing immediate rash, facial/tongue/throat swelling, SOB or lightheadedness with hypotension: No Has patient had a PCN reaction causing severe rash involving mucus membranes or skin necrosis: No Has patient had a PCN reaction that required hospitalization No Has patient had a PCN reaction occurring within the last 10 years: No If all of the above answers are "NO", then may proceed with Cephalosporin use.     Patient Measurements: Weight: 221 lb 11.2 oz (100.6 kg)  Ideal body weight: 66 kg Heparin Dosing Weight: 88 kg  Vital Signs: Temp: 97.5 F (36.4 C) (03/08 2054) Temp Source: Oral (03/08 2054) BP: 153/80 (03/08 2054) Pulse Rate: 92 (03/08 2054)  Labs: Recent Labs    05/13/17 1845 05/13/17 1850 05/13/17 2000  HGB 10.3* 11.6*  --   HCT 31.8* 34.0*  --   PLT 303  --   --   LABPROT 12.9  --   --   INR 0.98  --   --   CREATININE  --  4.10*  --   TROPONINI  --   --  <0.03    Estimated Creatinine Clearance: 21.1 mL/min (A) (by C-G formula based on SCr of 4.1 mg/dL (H)).   Medical History: Past Medical History:  Diagnosis Date  . Arthritis    "right leg" (08/22/2014)  . CAD (coronary artery disease)    a. CAD s/p 2 stent placement in [place] 2016. b. Neg nuc 05/2014 & 01/2016.  Marland Kitchen Chest pain 01/2017  . CKD (chronic kidney disease), stage IV (HCC)    Richard Barnett 08/22/2014  . Daily headache   . Depression    "I always get that" (08/22/2014)  . Diabetes mellitus (HCC)   . DVT (deep venous thrombosis) (HCC) ?   RLE  . GERD (gastroesophageal reflux disease)   . Heart murmur   . Hypercholesterolemia   . Hypertension   . Myocardial infarction (HCC)   . Wears glasses     Assessment: 62 year old  Richard Barnett with ESRD-HD MWF and history of DVT admitted from dialysis center having just completed dialysis with shortness of breath and chest pain to start IV heparin for rule out pulmonary embolism. CTA planned for AM. Patient was not on anticoagulation prior to admission. Troponin negative. INR 0.98. CXR negative. Hgb is 11.6 and platelets are within normal limits.   Goal of Therapy:  Heparin level 0.3-0.7 units/ml Monitor platelets by anticoagulation protocol: Yes   Plan:  Heparin bolus 6000 units x1 Heparin drip at 1150 units/hr.  Heparin level in 8 hours.  Daily heparin level and CBC.  Follow-up CTA.   Link Snuffer, PharmD, BCPS, BCCCP Clinical Pharmacist Clinical phone 05/13/2017 until 11PM [phone] After hours, please call [phone] 05/13/2017,9:14 PM

## 2017-05-13 NOTE — ED Notes (Signed)
Admitting Provider at bedside. 

## 2017-05-14 ENCOUNTER — Observation Stay (HOSPITAL_BASED_OUTPATIENT_CLINIC_OR_DEPARTMENT_OTHER): Payer: Medicaid Other

## 2017-05-14 ENCOUNTER — Observation Stay (HOSPITAL_COMMUNITY): Payer: Medicaid Other

## 2017-05-14 ENCOUNTER — Other Ambulatory Visit (HOSPITAL_COMMUNITY): Payer: Self-pay

## 2017-05-14 ENCOUNTER — Encounter (HOSPITAL_COMMUNITY): Payer: Self-pay | Admitting: Radiology

## 2017-05-14 ENCOUNTER — Other Ambulatory Visit: Payer: Self-pay

## 2017-05-14 DIAGNOSIS — I2510 Atherosclerotic heart disease of native coronary artery without angina pectoris: Secondary | ICD-10-CM | POA: Diagnosis not present

## 2017-05-14 DIAGNOSIS — E785 Hyperlipidemia, unspecified: Secondary | ICD-10-CM | POA: Diagnosis not present

## 2017-05-14 DIAGNOSIS — N186 End stage renal disease: Secondary | ICD-10-CM | POA: Diagnosis not present

## 2017-05-14 DIAGNOSIS — I120 Hypertensive chronic kidney disease with stage 5 chronic kidney disease or end stage renal disease: Secondary | ICD-10-CM | POA: Diagnosis not present

## 2017-05-14 DIAGNOSIS — Z87891 Personal history of nicotine dependence: Secondary | ICD-10-CM | POA: Diagnosis not present

## 2017-05-14 DIAGNOSIS — F332 Major depressive disorder, recurrent severe without psychotic features: Secondary | ICD-10-CM | POA: Diagnosis not present

## 2017-05-14 DIAGNOSIS — R7989 Other specified abnormal findings of blood chemistry: Secondary | ICD-10-CM | POA: Diagnosis not present

## 2017-05-14 DIAGNOSIS — E1122 Type 2 diabetes mellitus with diabetic chronic kidney disease: Secondary | ICD-10-CM

## 2017-05-14 DIAGNOSIS — K219 Gastro-esophageal reflux disease without esophagitis: Secondary | ICD-10-CM | POA: Diagnosis not present

## 2017-05-14 DIAGNOSIS — R45851 Suicidal ideations: Secondary | ICD-10-CM | POA: Diagnosis not present

## 2017-05-14 DIAGNOSIS — I10 Essential (primary) hypertension: Secondary | ICD-10-CM | POA: Diagnosis not present

## 2017-05-14 DIAGNOSIS — T82898D Other specified complication of vascular prosthetic devices, implants and grafts, subsequent encounter: Secondary | ICD-10-CM | POA: Diagnosis not present

## 2017-05-14 DIAGNOSIS — Z992 Dependence on renal dialysis: Secondary | ICD-10-CM | POA: Diagnosis not present

## 2017-05-14 DIAGNOSIS — E876 Hypokalemia: Secondary | ICD-10-CM | POA: Diagnosis not present

## 2017-05-14 DIAGNOSIS — R079 Chest pain, unspecified: Secondary | ICD-10-CM | POA: Diagnosis not present

## 2017-05-14 DIAGNOSIS — T82898A Other specified complication of vascular prosthetic devices, implants and grafts, initial encounter: Secondary | ICD-10-CM | POA: Diagnosis not present

## 2017-05-14 DIAGNOSIS — R0789 Other chest pain: Secondary | ICD-10-CM | POA: Diagnosis not present

## 2017-05-14 LAB — BASIC METABOLIC PANEL
ANION GAP: 15 (ref 5–15)
BUN: 25 mg/dL — ABNORMAL HIGH (ref 6–20)
CO2: 25 mmol/L (ref 22–32)
Calcium: 7.9 mg/dL — ABNORMAL LOW (ref 8.9–10.3)
Chloride: 97 mmol/L — ABNORMAL LOW (ref 101–111)
Creatinine, Ser: 5.36 mg/dL — ABNORMAL HIGH (ref 0.61–1.24)
GFR, EST AFRICAN AMERICAN: 12 mL/min — AB (ref >60)
GFR, EST NON AFRICAN AMERICAN: 10 mL/min — AB (ref >60)
GLUCOSE: 120 mg/dL — AB (ref 65–99)
POTASSIUM: 3.5 mmol/L (ref 3.5–5.1)
Sodium: 137 mmol/L (ref 135–145)

## 2017-05-14 LAB — LIPID PANEL
Cholesterol: 186 mg/dL (ref 0–200)
HDL: 33 mg/dL — ABNORMAL LOW (ref >40)
LDL Cholesterol: 107 mg/dL — ABNORMAL HIGH (ref 0–99)
Total CHOL/HDL Ratio: 5.6 RATIO
Triglycerides: 230 mg/dL — ABNORMAL HIGH (ref <150)
VLDL: 46 mg/dL — ABNORMAL HIGH (ref 0–40)

## 2017-05-14 LAB — CBC
HEMATOCRIT: 30.4 % — AB (ref 39.0–52.0)
Hemoglobin: 9.8 g/dL — ABNORMAL LOW (ref 13.0–17.0)
MCH: 28.9 pg (ref 26.0–34.0)
MCHC: 32.2 g/dL (ref 30.0–36.0)
MCV: 89.7 fL (ref 78.0–100.0)
Platelets: 283 10*3/uL (ref 150–400)
RBC: 3.39 MIL/uL — AB (ref 4.22–5.81)
RDW: 14.1 % (ref 11.5–15.5)
WBC: 7.3 10*3/uL (ref 4.0–10.5)

## 2017-05-14 LAB — HEPARIN LEVEL (UNFRACTIONATED): Heparin Unfractionated: 0.3 IU/mL (ref 0.30–0.70)

## 2017-05-14 LAB — TROPONIN I
Troponin I: <0.03 ng/mL (ref <0.03)
Troponin I: <0.03 ng/mL (ref <0.03)

## 2017-05-14 MED ORDER — IOPAMIDOL (ISOVUE-370) INJECTION 76%
INTRAVENOUS | Status: AC
  Administered 2017-05-14: 100 mL
  Filled 2017-05-14: qty 100

## 2017-05-14 MED ORDER — SERTRALINE HCL 50 MG PO TABS: 50.0000 mg | ORAL_TABLET | Freq: Every day | ORAL | Status: DC

## 2017-05-14 MED ORDER — HEPARIN SODIUM (PORCINE) 5000 UNIT/ML IJ SOLN
5000.0000 [IU] | Freq: Three times a day (TID) | INTRAMUSCULAR | Status: DC
  Administered 2017-05-14 – 2017-05-20 (×16): 5000 [IU] via SUBCUTANEOUS
  Filled 2017-05-14 (×16): qty 1

## 2017-05-14 MED ORDER — SERTRALINE HCL 50 MG PO TABS
50.0000 mg | ORAL_TABLET | Freq: Every day | ORAL | Status: DC
  Administered 2017-05-14 – 2017-05-20 (×7): 50 mg via ORAL
  Filled 2017-05-14 (×7): qty 1

## 2017-05-14 NOTE — Consult Note (Addendum)
[facility] Face-to-Face Psychiatry Consult   Reason for Consult:  Suicidal thoughts Referring Physician:  Dr. Wynelle Cleveland Patient Identification: Richard Barnett MRN:  [id number] Principal Diagnosis: Major depressive disorder, recurrent episode, severe ([place]) Diagnosis:   Patient Active Problem List   Diagnosis Date Noted  . Major depressive disorder, recurrent episode, severe ([place]) [F33.2] 05/14/2017  . GERD (gastroesophageal reflux disease) [K21.9] 05/13/2017  . ESRD on dialysis ([place]) [N18.6, Z99.2] 05/13/2017  . Suicidal ideation [R45.851] 05/13/2017  . NSTEMI (non-ST elevated myocardial infarction) ([place]) [I21.4] 01/27/2017  . Diabetes mellitus ([place]) [E11.9]   . Anemia of chronic renal failure [N18.9, D63.1]   . Secondary hyperparathyroidism of renal origin ([place]) [N25.81]   . Proteinuria [R80.9]   . Arthritis [M19.90]   . Depression [F32.9]   . Deep vein thrombosis (HCC) [I82.409]   . Myocardial infarction ([place]) [I21.9]   . Heart failure, unspecified ([place]) [I50.9]   . Heart murmur [R01.1]   . Prediabetes [R73.03] 02/22/2015  . Chronic kidney disease, stage IV (severe) ([place]) [N18.4]   . Hyperlipidemia [E78.5] 05/22/2014  . Chest pain [R07.9] 05/21/2014  . Hypokalemia [E87.6] 05/21/2014  . CAD (coronary artery disease) [I25.10] 05/21/2014  . Hypertension [I10] 05/21/2014    Total Time spent with patient: 1 hour  Subjective:   [name] is a 62 y.o. [place] patient admitted with chest pain.  HPI: Patient is a 62 y.o.malewith medical history significant ofESRD-HD (MWF, justed started in 04/2017),hypertension, hyperlipidemia, diet-controlled diabetes, CAD, stent placement 01/2017, GERD, DVT, who presents with chest pain. Patient reports long history of Major depression dating back to when he was living in [place] many years ago. He states that he was prescribed anti-depressant but has not been compliant with it. He is endorsing worsening depression, feeling hopeless, worthless and states  that he hates himself and if he has opportunity he  wants to kill himself. However, he got angry when I asked him if he has a plan to terminate his life. ''I have no plan, I just feel suicidal from time to time because of my multiple medical problem.''. Patient denies delusions, psychosis or alcohol/drug abuse. However, he is unable to contract for safety.  Past Psychiatric History: as above  Risk to Self: Is patient at risk for suicide?: Yes Risk to Others:   Prior Inpatient Therapy:   Prior Outpatient Therapy:    Past Medical History:  Past Medical History:  Diagnosis Date  . Arthritis    "right leg" (08/22/2014)  . CAD (coronary artery disease)    a. CAD s/p 2 stent placement in [place] 2016. b. Neg nuc 05/2014 & 01/2016.  Marland Kitchen Chest pain 01/2017  . CKD (chronic kidney disease), stage IV ([place])    Archie Endo 08/22/2014  . Daily headache   . Depression    "I always get that" (08/22/2014)  . Diabetes mellitus ([place])   . DVT (deep venous thrombosis) ([place]) ?   RLE  . GERD (gastroesophageal reflux disease)   . Heart murmur   . Hypercholesterolemia   . Hypertension   . Myocardial infarction ([place])   . Wears glasses     Past Surgical History:  Procedure Laterality Date  . AV FISTULA PLACEMENT Left 10/01/2016   Procedure: INSERTION OF 4-7MM X 45CM ARTERIOVENOUS (AV) GORE-TEX GRAFT ARM;  Surgeon: Angelia Mould, MD;  Location: [facility];  Service: Vascular;  Laterality: Left;  . CORONARY ANGIOPLASTY WITH STENT PLACEMENT     "1 + 1"  . DRAINAGE AND CLOSURE OF LYMPHOCELE Left  10/29/2016   Procedure: EVACUATION OF LYMPHOCELE LEFT ARM ARTERIOVENOUS GRAFT;  Surgeon: Angelia Mould, MD;  Location: [facility];  Service: Vascular;  Laterality: Left;  . LEFT HEART CATH AND CORONARY ANGIOGRAPHY N/A 01/14/2017   Procedure: LEFT HEART CATH AND CORONARY ANGIOGRAPHY;  Surgeon: Martinique, Peter M, MD;  Location: [facility];  Service: Cardiovascular;  Laterality: N/A;   Family History:  Family  History  Problem Relation Age of Onset  . Hypertension Mother   . Kidney disease Mother   . Heart disease Mother   . Heart disease Father   . Hypertension Father    Family Psychiatric  History:  Social History:  Social History   Substance and Sexual Activity  Alcohol Use No   Comment: 10/27/16 "stopped in ~ 2011"     Social History   Substance and Sexual Activity  Drug Use No   Comment: 10/27/16 "stopped in ~ 2011; all types of drugs""    Social History   Socioeconomic History  . Marital status: Divorced    Spouse name: None  . Number of children: None  . Years of education: None  . Highest education level: None  Social Needs  . Financial resource strain: None  . Food insecurity - worry: None  . Food insecurity - inability: None  . Transportation needs - medical: None  . Transportation needs - non-medical: None  Occupational History  . None  Tobacco Use  . Smoking status: Former Smoker    Packs/day: 0.00    Years: 40.00    Pack years: 0.00    Types: Cigarettes    Last attempt to quit: 08/29/2010    Years since quitting: 6.7  . Smokeless tobacco: Never Used  . Tobacco comment: "quit smoking cigarettes in  2011" 10/27/16  Substance and Sexual Activity  . Alcohol use: No    Comment: 10/27/16 "stopped in ~ 2011"  . Drug use: No    Comment: 10/27/16 "stopped in ~ 2011; all types of drugs""  . Sexual activity: No  Other Topics Concern  . None  Social History Narrative  . None   Additional Social History:    Allergies:   Allergies  Allergen Reactions  . Penicillins Anaphylaxis, Swelling and Rash    ANGIOEDEMA "SWELLING OF ENTIRE BODY"  Has patient had a PCN reaction causing immediate rash, facial/tongue/throat swelling, SOB or lightheadedness with hypotension: No Has patient had a PCN reaction causing severe rash involving mucus membranes or skin necrosis: No Has patient had a PCN reaction that required hospitalization No Has patient had a PCN reaction  occurring within the last 10 years: No If all of the above answers are "NO", then may proceed with Cephalosporin use.     Labs:  Results for orders placed or performed during the hospital encounter of 05/13/17 (from the past 48 hour(s))  CBC     Status: Abnormal   Collection Time: 05/13/17  6:45 PM  Result Value Ref Range   WBC 6.6 4.0 - 10.5 K/uL   RBC 3.60 (L) 4.22 - 5.81 MIL/uL   Hemoglobin 10.3 (L) 13.0 - 17.0 g/dL   HCT 31.8 (L) 39.0 - 52.0 %   MCV 88.3 78.0 - 100.0 fL   MCH 28.6 26.0 - 34.0 pg   MCHC 32.4 30.0 - 36.0 g/dL   RDW 13.5 11.5 - 15.5 %   Platelets 303 150 - 400 K/uL    Comment: Performed at [facility], [place] [address] [place], [place]  [address]  Brain natriuretic peptide (order if patient c/o SOB ONLY)     Status: None   Collection Time: 05/13/17  6:45 PM  Result Value Ref Range   B Natriuretic Peptide 28.1 0.0 - 100.0 pg/mL    Comment: Performed at [facility] [address], [place], [place] [address]  Magnesium     Status: None   Collection Time: 05/13/17  6:45 PM  Result Value Ref Range   Magnesium 1.8 1.7 - 2.4 mg/dL    Comment: Performed at [facility] [address], [place], [place] [address]  Protime-INR     Status: None   Collection Time: 05/13/17  6:45 PM  Result Value Ref Range   Prothrombin Time 12.9 11.4 - 15.2 seconds   INR 0.98     Comment: Performed at [facility] [address], [place], [place] [address]  I-stat troponin, ED (0, 3)     Status: None   Collection Time: 05/13/17  6:49 PM  Result Value Ref Range   Troponin i, poc 0.01 0.00 - 0.08 ng/mL   Comment 3            Comment: Due to the release kinetics of cTnI, a negative result within the first hours of the onset of symptoms does not rule out myocardial infarction with certainty. If myocardial infarction is still suspected, repeat the test at appropriate intervals.   I-Stat Chem 8, ED     Status: Abnormal   Collection Time: 05/13/17  6:50 PM   Result Value Ref Range   Sodium 138 135 - 145 mmol/L   Potassium 2.8 (L) 3.5 - 5.1 mmol/L   Chloride 94 (L) 101 - 111 mmol/L   BUN 16 6 - 20 mg/dL   Creatinine, Ser 4.10 (H) 0.61 - 1.24 mg/dL   Glucose, Bld 95 65 - 99 mg/dL   Calcium, Ion 0.90 (L) 1.15 - 1.40 mmol/L   TCO2 29 22 - 32 mmol/L   Hemoglobin 11.6 (L) 13.0 - 17.0 g/dL   HCT 34.0 (L) 39.0 - 52.0 %  CBG monitoring, ED     Status: None   Collection Time: 05/13/17  7:30 PM  Result Value Ref Range   Glucose-Capillary 96 65 - 99 mg/dL   Comment 1 Notify RN    Comment 2 Document in Chart   D-dimer, quantitative (not at [facility])     Status: Abnormal   Collection Time: 05/13/17  8:00 PM  Result Value Ref Range   D-Dimer, Quant 4.23 (H) 0.00 - 0.50 ug/mL-FEU    Comment: (NOTE) At the manufacturer cut-off of 0.50 ug/mL FEU, this assay has been documented to exclude PE with a sensitivity and negative predictive value of 97 to 99%.  At this time, this assay has not been approved by the FDA to exclude DVT/VTE. Results should be correlated with clinical presentation. Performed at [facility], [place] [address], [place], [place] [address]   Troponin I (q 6hr x 3)     Status: None   Collection Time: 05/13/17  8:00 PM  Result Value Ref Range   Troponin I <0.03 <0.03 ng/mL    Comment: Performed at [facility] [address], [place], [place] [address]  Troponin I (q 6hr x 3)     Status: None   Collection Time: 05/14/17  2:16 AM  Result Value Ref Range   Troponin I <0.03 <0.03 ng/mL    Comment: Performed at [facility]  [facility], [place] [address], [place], [place] [address]  Lipid panel     Status: Abnormal   Collection Time: 05/14/17  2:16 AM  Result Value Ref Range   Cholesterol 186 0 - 200 mg/dL   Triglycerides 230 (H) <150 mg/dL   HDL 33 (L) >40 mg/dL   Total CHOL/HDL Ratio 5.6 RATIO   VLDL 46 (H) 0 - 40 mg/dL   LDL Cholesterol 107 (H) 0 - 99 mg/dL    Comment:        Total Cholesterol/HDL:CHD Risk Coronary Heart  Disease Risk Table                     Men   Women  1/2 Average Risk   3.4   3.3  Average Risk       5.0   4.4  2 X Average Risk   9.6   7.1  3 X Average Risk  23.4   11.0        Use the calculated Patient Ratio above and the CHD Risk Table to determine the patient's CHD Risk.        ATP III CLASSIFICATION (LDL):  <100     mg/dL   Optimal  100-129  mg/dL   Near or Above                    Optimal  130-159  mg/dL   Borderline  160-189  mg/dL   High  >190     mg/dL   Very High Performed at [facility] [address], [place], [place] [address]   Troponin I (q 6hr x 3)     Status: None   Collection Time: 05/14/17  7:08 AM  Result Value Ref Range   Troponin I <0.03 <0.03 ng/mL    Comment: Performed at [facility] [address], [place], [place] [address]  Heparin level (unfractionated)     Status: None   Collection Time: 05/14/17  7:08 AM  Result Value Ref Range   Heparin Unfractionated 0.30 0.30 - 0.70 IU/mL    Comment:        IF HEPARIN RESULTS ARE BELOW EXPECTED VALUES, AND PATIENT DOSAGE HAS BEEN CONFIRMED, SUGGEST FOLLOW UP TESTING OF ANTITHROMBIN III LEVELS. Performed at [facility], [place] [address] [place], [place] [address]   CBC     Status: Abnormal   Collection Time: 05/14/17  7:08 AM  Result Value Ref Range   WBC 7.3 4.0 - 10.5 K/uL   RBC 3.39 (L) 4.22 - 5.81 MIL/uL   Hemoglobin 9.8 (L) 13.0 - 17.0 g/dL   HCT 30.4 (L) 39.0 - 52.0 %   MCV 89.7 78.0 - 100.0 fL   MCH 28.9 26.0 - 34.0 pg   MCHC 32.2 30.0 - 36.0 g/dL   RDW 14.1 11.5 - 15.5 %   Platelets 283 150 - 400 K/uL    Comment: Performed at [facility], [place] [address] [place], [place] [address]  Basic metabolic panel     Status: Abnormal   Collection Time: 05/14/17  8:09 AM  Result Value Ref Range   Sodium 137 135 - 145 mmol/L   Potassium 3.5 3.5 - 5.1 mmol/L   Chloride 97 (L) 101 - 111 mmol/L   CO2 25 22 - 32 mmol/L   Glucose, Bld 120 (H) 65 - 99 mg/dL   BUN 25 (H) 6 -  20 mg/dL  Creatinine, Ser 5.36 (H) 0.61 - 1.24 mg/dL   Calcium 7.9 (L) 8.9 - 10.3 mg/dL   GFR calc non Af Amer 10 (L) >60 mL/min   GFR calc Af Amer 12 (L) >60 mL/min    Comment: (NOTE) The eGFR has been calculated using the CKD EPI equation. This calculation has not been validated in all clinical situations. eGFR's persistently <60 mL/min signify possible Chronic Kidney Disease.    Anion gap 15 5 - 15    Comment: Performed at [facility] [address] [place], [place] [address]    Current Facility-Administered Medications  Medication Dose Route Frequency Provider Last Rate Last Dose  . acetaminophen (TYLENOL) tablet 650 mg  650 mg Oral Q4H PRN Ivor Costa, MD      . ALPRAZolam Duanne Moron) tablet 0.25 mg  0.25 mg Oral BID PRN Ivor Costa, MD      . amLODipine (NORVASC) tablet 10 mg  10 mg Oral Daily Ivor Costa, MD   10 mg at 05/14/17 6803  . aspirin EC tablet 81 mg  81 mg Oral Daily Ivor Costa, MD   81 mg at 05/14/17 0813  . atorvastatin (LIPITOR) tablet 80 mg  80 mg Oral q1800 Ivor Costa, MD   80 mg at 05/13/17 2148  . clopidogrel (PLAVIX) tablet 75 mg  75 mg Oral Daily Ivor Costa, MD   75 mg at 05/14/17 2122  . heparin injection 5,000 Units  5,000 Units Subcutaneous Q8H Rizwan, Saima, MD      . hydrALAZINE (APRESOLINE) injection 5 mg  5 mg Intravenous Q2H PRN Ivor Costa, MD      . isosorbide mononitrate (IMDUR) 24 hr tablet 30 mg  30 mg Oral Daily Ivor Costa, MD   30 mg at 05/14/17 4825  . latanoprost (XALATAN) 0.005 % ophthalmic solution 1 drop  1 drop Both Eyes QHS Ivor Costa, MD   1 drop at 05/13/17 2238  . metoprolol tartrate (LOPRESSOR) tablet 50 mg  50 mg Oral BID WC Ivor Costa, MD   50 mg at 05/14/17 0813  . nitroGLYCERIN (NITROSTAT) SL tablet 0.4 mg  0.4 mg Sublingual Q5 min PRN Ivor Costa, MD   0.4 mg at 05/14/17 0335  . ondansetron (ZOFRAN) injection 4 mg  4 mg Intravenous Q6H PRN Ivor Costa, MD      . oxyCODONE-acetaminophen (PERCOCET/ROXICET) 5-325 MG per tablet 1  tablet  1 tablet Oral Q6H PRN Ivor Costa, MD   1 tablet at 05/14/17 0006  . pantoprazole (PROTONIX) EC tablet 40 mg  40 mg Oral Daily Ivor Costa, MD   40 mg at 05/14/17 0813  . sertraline (ZOLOFT) tablet 50 mg  50 mg Oral Daily Staysha Truby, MD      . zolpidem (AMBIEN) tablet 5 mg  5 mg Oral QHS PRN,MR X 1 Ivor Costa, MD        Musculoskeletal: Strength & Muscle Tone: within normal limits Gait & Station: normal Patient leans: N/A  Psychiatric Specialty Exam: Physical Exam  Psychiatric: His speech is normal. Judgment normal. His affect is angry. He is withdrawn. Cognition and memory are normal. He exhibits a depressed mood.    Review of Systems  Constitutional: Negative.   HENT: Negative.   Eyes: Negative.   Respiratory: Negative.   Cardiovascular: Negative.   Gastrointestinal: Negative.   Musculoskeletal: Negative.   Skin: Negative.   Neurological: Negative.   Endo/Heme/Allergies: Negative.   Psychiatric/Behavioral: Positive for depression and suicidal ideas.    Blood pressure 124/73,  pulse 76, temperature 97.6 F (36.4 C), temperature source Oral, resp. rate 16, height '5\' 7"'  (1.702 m), weight 100.6 kg (221 lb 11.2 oz), SpO2 98 %.Body mass index is 34.72 kg/m.  General Appearance: Casual  Eye Contact:  Good  Speech:  Clear and Coherent  Volume:  Decreased  Mood:  Angry and Depressed  Affect:  Constricted  Thought Process:  Coherent and Descriptions of Associations: Intact  Orientation:  Full (Time, Place, and Person)  Thought Content:  Logical  Suicidal Thoughts:  Yes.  without intent/plan  Homicidal Thoughts:  No  Memory:  Immediate;   Good Recent;   Good Remote;   Good  Judgement:  Impaired  Insight:  Shallow  Psychomotor Activity:  Increased  Concentration:  Concentration: Fair and Attention Span: Fair  Recall:  [employer] of Knowledge:  Good  Language:  Good  Akathisia:  No  Handed:  Right  AIMS (if indicated):     Assets:  Communication Skills  ADL's:   Intact  Cognition:  WNL  Sleep:   fair     Treatment Plan Summary: Daily contact with patient to assess and evaluate symptoms and progress in treatment and Medication management  Plan/Recommendations: -Continue 1:1 sitter for safety -Start Zoloft 50 mg daily for depression  Disposition: Recommend psychiatric Inpatient admission when medically cleared. Unit social worker to assist with placing patient in a psychiatric hospital when she is medically cleared.  Corena Pilgrim, MD 05/14/2017 4:20 PM

## 2017-05-14 NOTE — Progress Notes (Signed)
ANTICOAGULATION CONSULT NOTE - Initial Consult  Pharmacy Consult for Heparin Indication: Rule out Pulmonary embolism with history of DVT  Allergies  Allergen Reactions  . Penicillins Swelling and Rash    ANGIOEDEMA "SWELLING OF ENTIRE BODY"  Has patient had a PCN reaction causing immediate rash, facial/tongue/throat swelling, SOB or lightheadedness with hypotension: No Has patient had a PCN reaction causing severe rash involving mucus membranes or skin necrosis: No Has patient had a PCN reaction that required hospitalization No Has patient had a PCN reaction occurring within the last 10 years: No If all of the above answers are "NO", then may proceed with Cephalosporin use.     Patient Measurements: Height: 5\' 7"  (170.2 cm) Weight: 221 lb 11.2 oz (100.6 kg) IBW/kg (Calculated) : 66.1  Ideal body weight: 66 kg Heparin Dosing Weight: 88 kg  Vital Signs: Temp: 98.6 F (37 C) (03/09 0545) Temp Source: Oral (03/09 0545) BP: 133/78 (03/09 0812) Pulse Rate: 77 (03/09 0813)  Labs: Recent Labs    05/13/17 1845 05/13/17 1850 05/13/17 2000 05/14/17 0216 05/14/17 0708  HGB 10.3* 11.6*  --   --  9.8*  HCT 31.8* 34.0*  --   --  30.4*  PLT 303  --   --   --  283  LABPROT 12.9  --   --   --   --   INR 0.98  --   --   --   --   HEPARINUNFRC  --   --   --   --  0.30  CREATININE  --  4.10*  --   --   --   TROPONINI  --   --  <0.03 <0.03 <0.03    Estimated Creatinine Clearance: 21.1 mL/min (A) (by C-G formula based on SCr of 4.1 mg/dL (H)).   Medical History: Past Medical History:  Diagnosis Date  . Arthritis    "right leg" (08/22/2014)  . CAD (coronary artery disease)    a. CAD s/p 2 stent placement in [place] 2016. b. Neg nuc 05/2014 & 01/2016.  Marland Kitchen Chest pain 01/2017  . CKD (chronic kidney disease), stage IV (HCC)    [name] 08/22/2014  . Daily headache   . Depression    "I always get that" (08/22/2014)  . Diabetes mellitus (HCC)   . DVT (deep venous thrombosis) (HCC) ?   RLE  . GERD (gastroesophageal reflux disease)   . Heart murmur   . Hypercholesterolemia   . Hypertension   . Myocardial infarction (HCC)   . Wears glasses     Assessment: 62 year old Richard Barnett with ESRD-HD MWF and history of DVT admitted from dialysis center having just completed dialysis with shortness of breath and chest pain to start IV heparin for rule out pulmonary embolism. CTA planned for AM. Patient was not on anticoagulation prior to admission. Troponin negative. INR 0.98. CXR negative. Hgb is 9.8 and platelets are within normal limits.    Heparin level this AM is 0.3 which is at the lowest end of the goal range. Will increase heparin slightly to keep HL >0.3.   Goal of Therapy:  Heparin level 0.3-0.7 units/ml Monitor platelets by anticoagulation protocol: Yes   Plan:  Increased Heparin drip to 1200 units/hr to keep HL >0.3 Obtain 8-hour heparin level Daily heparin level and CBC Follow-up CTA  Adline Potter, PharmD Pharmacy Resident Pager: [phone]

## 2017-05-14 NOTE — Progress Notes (Signed)
Bilateral lower extremity venous duplex has been completed. Negative for DVT.  05/14/17 3:47 PM Olen Cordial RVT

## 2017-05-14 NOTE — Progress Notes (Signed)
Pt's pain at 03:35 was 7/10, now 5/10 but he is refusing any more NTG D/T headache, will give 2 MG IV Morphine and continue to monitor, VSS but B/P starting to come down, see VS sheet.

## 2017-05-14 NOTE — Consult Note (Addendum)
Cardiology Consult    Patient ID: Richard Barnett MRN: [id number], DOB/AGE: [birth year]   Admit date: 05/13/2017 Date of Consult: 05/14/2017  Primary Physician: Patient, No Pcp Per Primary Cardiologist: Dr. Ian Malkin cardiology [facility] Requesting Provider: Dr. Butler Denmark Reason for Consultation: Chest pain  Richard Barnett is a 62 y.o. [place] who is being seen today for the evaluation of chest pain at the request of Dr. Butler Denmark.   Patient Profile    62 yo Richard Barnett with PMH of CAD, HTN, DMII, HL, ESRD on HD and noncompliance who presented with chest pain.   Past Medical History   Past Medical History:  Diagnosis Date  . Arthritis    "right leg" (08/22/2014)  . CAD (coronary artery disease)    a. CAD s/p 2 stent placement in [place] 2016. b. Neg nuc 05/2014 & 01/2016.  Marland Kitchen Chest pain 01/2017  . CKD (chronic kidney disease), stage IV (HCC)    [name] 08/22/2014  . Daily headache   . Depression    "I always get that" (08/22/2014)  . Diabetes mellitus (HCC)   . DVT (deep venous thrombosis) (HCC) ?   RLE  . GERD (gastroesophageal reflux disease)   . Heart murmur   . Hypercholesterolemia   . Hypertension   . Myocardial infarction (HCC)   . Wears glasses     Past Surgical History:  Procedure Laterality Date  . AV FISTULA PLACEMENT Left 10/01/2016   Procedure: INSERTION OF 4-7MM X 45CM ARTERIOVENOUS (AV) GORE-TEX GRAFT ARM;  Surgeon: Chuck Hint, MD;  Location: [facility] OR;  Service: Vascular;  Laterality: Left;  . CORONARY ANGIOPLASTY WITH STENT PLACEMENT     "1 + 1"  . DRAINAGE AND CLOSURE OF LYMPHOCELE Left 10/29/2016   Procedure: EVACUATION OF LYMPHOCELE LEFT ARM ARTERIOVENOUS GRAFT;  Surgeon: Chuck Hint, MD;  Location: [facility] OR;  Service: Vascular;  Laterality: Left;  . LEFT HEART CATH AND CORONARY ANGIOGRAPHY N/A 01/14/2017   Procedure: LEFT HEART CATH AND CORONARY ANGIOGRAPHY;  Surgeon: Swaziland, Peter M, MD;  Location: [facility] INVASIVE CV LAB;  Service: Cardiovascular;   Laterality: N/A;     Allergies  Allergies  Allergen Reactions  . Penicillins Swelling and Rash    ANGIOEDEMA "SWELLING OF ENTIRE BODY"  Has patient had a PCN reaction causing immediate rash, facial/tongue/throat swelling, SOB or lightheadedness with hypotension: No Has patient had a PCN reaction causing severe rash involving mucus membranes or skin necrosis: No Has patient had a PCN reaction that required hospitalization No Has patient had a PCN reaction occurring within the last 10 years: No If all of the above answers are "NO", then may proceed with Cephalosporin use.     History of Present Illness    Richard Barnett is a 62 yo Nellie Nellie with PMH of CAD, HTN, DMII, HL, ESRD on HD and noncompliance. He was followed by [facility] cardiology as an outpatient. Most recently was admitted back in 11/18 with positive troponins and underwent cardiac cath with Dr. Swaziland noting 2 vessel CAD with 75% small diag lesion and 90% first OM with ISR. It was felt that intervention to this area would not be beneficial as it was a small vessel and previously stented. He was placed on plavix during that admission and instructed to continue on DAPT with close follow up with his cardiologist within 1-2 weeks. Of note he was unable to tolerate higher doses of Imdur 2/2 to headache, but amlodipine was increased to 10mg  daily. He was continued on high  dose statin.   Since his last admission he had progressed to ESRD on HD now MWF. Reports over the past couple of weeks he has intermittent episodes of chest pain, worse with inspiration. Was at dialysis yesterday, and had been on for about 3 hours when he developed recurrent chest pain. Presented to the ED from HD with symptoms.   In the ED his labs showed K+ 2.8, Cr 4.10, Hgb 11.6, Trop negx3, Ddimer 4.23, LDL 107. EKG showed SR with TWI in inferolateral leads noted on previous tracings. He was placed on IV heparin. CTA was not done, and planned for VQ scan day to rule out PE.    Inpatient Medications    . iopamidol      . amLODipine  10 mg Oral Daily  . aspirin EC  81 mg Oral Daily  . atorvastatin  80 mg Oral q1800  . clopidogrel  75 mg Oral Daily  . isosorbide mononitrate  30 mg Oral Daily  . latanoprost  1 drop Both Eyes QHS  . metoprolol tartrate  50 mg Oral BID WC  . pantoprazole  40 mg Oral Daily    Family History    Family History  Problem Relation Age of Onset  . Hypertension Mother   . Kidney disease Mother   . Heart disease Mother   . Heart disease Father   . Hypertension Father     Social History    Social History   Socioeconomic History  . Marital status: Divorced    Spouse name: Not on file  . Number of children: Not on file  . Years of education: Not on file  . Highest education level: Not on file  Social Needs  . Financial resource strain: Not on file  . Food insecurity - worry: Not on file  . Food insecurity - inability: Not on file  . Transportation needs - medical: Not on file  . Transportation needs - non-medical: Not on file  Occupational History  . Not on file  Tobacco Use  . Smoking status: Former Smoker    Packs/day: 0.00    Years: 40.00    Pack years: 0.00    Types: Cigarettes    Last attempt to quit: 08/29/2010    Years since quitting: 6.7  . Smokeless tobacco: Never Used  . Tobacco comment: "quit smoking cigarettes in  2011" 10/27/16  Substance and Sexual Activity  . Alcohol use: No    Comment: 10/27/16 "stopped in ~ 2011"  . Drug use: No    Comment: 10/27/16 "stopped in ~ 2011; all types of drugs""  . Sexual activity: No  Other Topics Concern  . Not on file  Social History Narrative  . Not on file     Review of Systems    See HPI  All other systems reviewed and are otherwise negative except as noted above.  Physical Exam    Blood pressure 133/78, pulse 77, temperature 98.6 F (37 C), temperature source Oral, resp. rate 16, height 5\' 7"  (1.702 m), weight 221 lb 11.2 oz (100.6 kg), SpO2 98 %.   General: Pleasant, older WM, NAD Psych: Normal affect. Neuro: Alert and oriented X 3. Moves all extremities spontaneously. HEENT: Normal  Neck: Supple, no JVD. Lungs:  Resp regular and unlabored, CTA. Heart: RRR + s3, no murmurs. Abdomen: Soft, non-tender, non-distended, BS + x 4.  Extremities: No clubbing, cyanosis or edema. DP/PT/Radials 2+ and equal bilaterally. AVF to LUA.   Labs    Troponin [facility]  of Care Test) Recent Labs    05/13/17 1849  TROPIPOC 0.01   Recent Labs    05/13/17 2000 05/14/17 0216 05/14/17 0708  TROPONINI <0.03 <0.03 <0.03   Lab Results  Component Value Date   WBC 7.3 05/14/2017   HGB 9.8 (L) 05/14/2017   HCT 30.4 (L) 05/14/2017   MCV 89.7 05/14/2017   PLT 283 05/14/2017    Recent Labs  Lab 05/14/17 0809  NA 137  K 3.5  CL 97*  CO2 25  BUN 25*  CREATININE 5.36*  CALCIUM 7.9*  GLUCOSE 120*   Lab Results  Component Value Date   CHOL 186 05/14/2017   HDL 33 (L) 05/14/2017   LDLCALC 107 (H) 05/14/2017   TRIG 230 (H) 05/14/2017   Lab Results  Component Value Date   DDIMER 4.23 (H) 05/13/2017     Radiology Studies    Dg Chest Portable 1 View  Result Date: 05/13/2017 CLINICAL DATA:  Pt here from dialysis with c/o central chest pain and dizziness. Pt did all but 30 mins of his dialysis. Denies SOB. Hx of HTN, diabetes, and CAD. Former smoker- quit 2012. EXAM: PORTABLE CHEST 1 VIEW COMPARISON:  04/07/2017 FINDINGS: Cardiac silhouette is normal in size. No mediastinal or hilar masses. No convincing adenopathy. Clear lungs.  No pleural effusion or pneumothorax. Skeletal structures are grossly intact. IMPRESSION: No active disease. Electronically Signed   By: Amie Portland M.D.   On: 05/13/2017 18:54    ECG & Cardiac Imaging    EKG:  The EKG was personally reviewed and demonstrates SR with TWI in inferolateral leads, noted on previous tracings.   Echo: 11/18  Study Conclusions  - Left ventricle: The cavity size was normal. Wall  thickness was   increased in a pattern of mild LVH. There was mild concentric   hypertrophy. Systolic function was normal. The estimated ejection   fraction was in the range of 55% to 60%. Wall motion was normal;   there were no regional wall motion abnormalities. Features are   consistent with a pseudonormal left ventricular filling pattern,   with concomitant abnormal relaxation and increased filling   pressure (grade 2 diastolic dysfunction). - Mitral valve: There was mild regurgitation. - Left atrium: The atrium was mildly dilated. - Tricuspid valve: There was mild-moderate regurgitation. - Pulmonary arteries: Systolic pressure was mildly increased. PA   peak pressure: 40 mm Hg (S).  Cath: 01/14/17  Conclusion     Ost LAD lesion is 30% stenosed.  Prox LAD lesion is 30% stenosed.  Mid LAD lesion is 30% stenosed.  1st Mrg lesion is 90% stenosed.  Ost 1st Diag lesion is 75% stenosed.  Prox RCA to Mid RCA lesion is 10% stenosed.  LV end diastolic pressure is moderately elevated.   1. Left dominant circulation 2. 2 vessel obstructive CAD    - 75% small diagonal branch    - 90% first OM - this is a small branch that bifurcates in the mid vessel. There is in stent restenosis. The vessel is small < 2.25 mm and disease extends past a bifurcation 3. Moderately elevated LVEDP  Plan: I only visualize one stent in the OM.  I would recommend continued medical therapy. In my opinion he would not benefit from repeat intervention of OM since this is a very small branch and previously stented.  Other major vessels are without significant disease.      Assessment & Plan    62 yo [place] with PMH of  CAD, HTN, DMII, HL, ESRD on HD and noncompliance who presented with chest pain.  1. Atypical chest pain: Chest pain is sharp in nature, worse with inspiration and laying down. Trop negx3. EKG nonischemic. He has ruled out. Does have known CAD with ISR in the 1st OM of 90% with no plans  for intervention given this is a small caliber vessel. Ddimer 4 with plans for VQ scan. Agree with plans to rule out acute PE as he has a hx of DVT.  - remains on IV heparin - continue with home medical therapy for CAD  2. HTN: controlled   3. HL: LDL still above goal, but reports compliance with statin  4. ESRD on HD: per admitting  5. Hypokalemia: resolved    Janice Coffin, NP-C Pager [phone] 05/14/2017, 8:43 AM   Attending Note:   The patient was seen and examined.  Agree with assessment and plan as noted above.  Changes made to the above note as needed.  Patient seen and independently examined with Laverda Page, NP .   We discussed all aspects of the encounter. I agree with the assessment and plan as stated above.  1.  Chest pain: Patient has pleuritic chest pain.  He also has a elevated d-dimer.  Initially there were plans for a VQ scan but I think a CT angiogram would be better.  He now has end-stage renal disease so his elevated creatinine is not an issue.  Troponins are negative.  2.  Coronary artery disease: The patient has known coronary artery disease.  His last heart catheterization in November, 2018 revealed in-stent restenosis of circumflex marginal stent.  Dr. Elvis Coil did not recommend any further interventions on the diffuse marginal artery stent.    I have spent a total of 40 minutes with patient reviewing hospital  notes , telemetry, EKGs, labs and examining patient as well as establishing an assessment and plan that was discussed with the patient. > 50% of time was spent in direct patient care.    Vesta Mixer, Montez Hageman., MD, [facility] 05/14/2017, 9:20 AM [address],  [address] Office [phone] Pager [phone]

## 2017-05-14 NOTE — Progress Notes (Signed)
VSS and pain now rated 3/10 but still refusing any more NTG, will continue to monitor. Pt now C/O feeling very warm, will continue to monitor.

## 2017-05-14 NOTE — Progress Notes (Signed)
Pt C/O right sided chest pain sharp and throbbing, got an EKG and gave 1 NTG S/L, VSS, will continue to monitor effects.

## 2017-05-14 NOTE — Progress Notes (Addendum)
PROGRESS NOTE    Audwin JAYCEN VERCHER   ZOX:[id number]  DOB: [birth year]  DOA: 05/13/2017 PCP: Patient, No Pcp Per   Brief Narrative:  Javontay SWANSON FARNELL  a 62 y.o. [place] with medical history significant of ESRD-HD (MWF, justed started in 04/2017), hypertension, hyperlipidemia, diet-controlled diabetes, CAD, stent placement 01/2017, GERD, depression, DVT, who presents with chest pain. This was left sided chest pain which occurred while on dialysis, felt like a cramp ,  was severe (10/10) and non radiating. He tells me that he has been getting a similar pain when he takes deep breaths. No respiratory symptoms or fevers. He states that he hates himself and wants to kill himself. He has no thought out plan. He tells me he is not sure if he is depressed and gives me no other reason for his suicidal intentions.      Subjective: See above ROS: no complaints of nausea, vomiting, constipation diarrhea, cough, dyspnea or dysuria. No other complaints.   Assessment & Plan:   Principal Problem:   Chest pain - worse when taking a deep breath- persistent yesterday while on dialysis but now intermittent again - d dimer was elevated: CTA chest negative, LE venous duplex negative - negative troponin - cardiology has evaluated him and feels that he does not need any further work up as he just had a Cardiac cath in 11/18 which did not reveal and concerning major stenosis. -cont ASA, Plavix and Statin   Active Problems:   Suicidal ideation - psych recommends inpatient psych admission- has sitter at beside - he is medically stable for transfer to a psych facility    Hypokalemia - replaced and normal today    Hypertension - Norvasc, Imdur, Metoprolol, PRN Hydralazine    Hyperlipidemia - LDL 107- cont Lipitor    GERD (gastroesophageal reflux disease) - PPI    ESRD on dialysis (HCC) - he missed 30 min of dialysis yesterday- does not appear fluid overloaded- labs stable - have notified Nephrology- no need  dialysis needs yet  AOCD - follow - Hb 9.8 today   DVT prophylaxis: Heparin Code Status: Full code Family Communication:  Disposition Plan: home when stable Consultants:   cardiology Procedures:    Antimicrobials:  Anti-infectives (From admission, onward)   None     Objective: Vitals:   05/14/17 0545 05/14/17 0812 05/14/17 0813 05/14/17 1431  BP: 140/85 133/78  124/73  Pulse: 78  77 76  Resp:      Temp: 98.6 F (37 C)   97.6 F (36.4 C)  TempSrc: Oral   Oral  SpO2: 98%     Weight:      Height:        Intake/Output Summary (Last 24 hours) at 05/14/2017 1517 Last data filed at 05/14/2017 1427 Gross per 24 hour  Intake 1189.61 ml  Output -  Net 1189.61 ml   Filed Weights   05/13/17 2054  Weight: 100.6 kg (221 lb 11.2 oz)    Examination: General exam: Appears comfortable  HEENT: PERRLA, oral mucosa moist, no sclera icterus or thrush Respiratory system: Clear to auscultation. Respiratory effort normal. Cardiovascular system: S1 & S2 heard, RRR.  No murmurs  Gastrointestinal system: Abdomen soft, non-tender, nondistended. Normal bowel sound. No organomegaly Central nervous system: Alert and oriented. No focal neurological deficits. Extremities: No cyanosis, clubbing or edema Skin: No rashes or ulcers Psychiatry:  Mood & affect appropriate.     Data Reviewed: I have personally reviewed following labs and imaging studies  CBC:  Recent Labs  Lab 05/13/17 1845 05/13/17 1850 05/14/17 0708  WBC 6.6  --  7.3  HGB 10.3* 11.6* 9.8*  HCT 31.8* 34.0* 30.4*  MCV 88.3  --  89.7  PLT 303  --  283   Basic Metabolic Panel: Recent Labs  Lab 05/13/17 1845 05/13/17 1850 05/14/17 0809  NA  --  138 137  K  --  2.8* 3.5  CL  --  94* 97*  CO2  --   --  25  GLUCOSE  --  95 120*  BUN  --  16 25*  CREATININE  --  4.10* 5.36*  CALCIUM  --   --  7.9*  MG 1.8  --   --    GFR: Estimated Creatinine Clearance: 16.1 mL/min (A) (by C-G formula based on SCr of 5.36 mg/dL  (H)). Liver Function Tests: No results for input(s): AST, ALT, ALKPHOS, BILITOT, PROT, ALBUMIN in the last 168 hours. No results for input(s): LIPASE, AMYLASE in the last 168 hours. No results for input(s): AMMONIA in the last 168 hours. Coagulation Profile: Recent Labs  Lab 05/13/17 1845  INR 0.98   Cardiac Enzymes: Recent Labs  Lab 05/13/17 2000 05/14/17 0216 05/14/17 0708  TROPONINI <0.03 <0.03 <0.03   BNP (last 3 results) No results for input(s): PROBNP in the last 8760 hours. HbA1C: No results for input(s): HGBA1C in the last 72 hours. CBG: Recent Labs  Lab 05/13/17 1930  GLUCAP 96   Lipid Profile: Recent Labs    05/14/17 0216  CHOL 186  HDL 33*  LDLCALC 107*  TRIG 230*  CHOLHDL 5.6   Thyroid Function Tests: No results for input(s): TSH, T4TOTAL, FREET4, T3FREE, THYROIDAB in the last 72 hours. Anemia Panel: No results for input(s): VITAMINB12, FOLATE, FERRITIN, TIBC, IRON, RETICCTPCT in the last 72 hours. Urine analysis:    Component Value Date/Time   COLORURINE YELLOW 04/07/2017 0301   APPEARANCEUR CLEAR 04/07/2017 0301   LABSPEC 1.013 04/07/2017 0301   PHURINE 6.0 04/07/2017 0301   GLUCOSEU 150 (A) 04/07/2017 0301   HGBUR MODERATE (A) 04/07/2017 0301   BILIRUBINUR NEGATIVE 04/07/2017 0301   KETONESUR NEGATIVE 04/07/2017 0301   PROTEINUR >=300 (A) 04/07/2017 0301   UROBILINOGEN 0.2 02/21/2015 0955   NITRITE NEGATIVE 04/07/2017 0301   LEUKOCYTESUR NEGATIVE 04/07/2017 0301   Sepsis Labs: @LABRCNTIP (procalcitonin:4,lacticidven:4) )No results found for this or any previous visit (from the past 240 hour(s)).       Radiology Studies: Ct Angio Chest Pe W Or Wo Contrast  Result Date: 05/14/2017 CLINICAL DATA:  Chest pain and shortness of breath for the past month. Evaluate for pulmonary embolism. EXAM: CT ANGIOGRAPHY CHEST WITH CONTRAST TECHNIQUE: Multidetector CT imaging of the chest was performed using the standard protocol during bolus  administration of intravenous contrast. Multiplanar CT image reconstructions and MIPs were obtained to evaluate the vascular anatomy. CONTRAST:  ISOVUE-370 IOPAMIDOL (ISOVUE-370) INJECTION 76% COMPARISON:  05/13/2017; 04/07/2017; 01/27/2017; 01/10/2016 FINDINGS: Vascular Findings: There is adequate opacification of the pulmonary arterial system with the main pulmonary artery measuring 352 Hounsfield units. There are no discrete filling defects within the pulmonary arterial tree to suggest pulmonary embolism. Normal caliber the main pulmonary artery. Borderline cardiomegaly. No pericardial effusion. Coronary artery calcifications. Atherosclerotic plaque within a normal caliber thoracic aorta. No definite thoracic aortic annular, dissection or periaortic stranding on this nongated examination. Conventional configuration of the aortic arch. The branch vessels of the aortic arch appear patent throughout their imaged course. Review of the MIP images  confirms the above findings. ---------------------------------------------------------------------------------- Nonvascular Findings: Mediastinum/Lymph Nodes: No bulky mediastinal, hilar or axillary lymphadenopathy. Lungs/Pleura: Minimal dependent subpleural ground-glass atelectasis. No discrete focal airspace opacities. No pleural effusion or pneumothorax. Mild to moderate apical predominant mixed centrilobular and paraseptal emphysematous change. No discrete pulmonary nodules. Upper abdomen: Limited early arterial phase evaluation of the upper abdomen demonstrates a small hiatal hernia. Musculoskeletal: No acute or aggressive osseous abnormalities. 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 Stigmata of mild DISH throughout the thoracic spine. Mild diffuse body wall anasarca. Imaged portions of the thyroid gland are normal. IMPRESSION: 1. Mild-to-moderate apical predominant  mixed centrilobular and paraseptal emphysematous change. Otherwise, no explanation for patient's shortness of breath. Specifically, no evidence of pulmonary embolism. 2. Borderline cardiomegaly. 3. Coronary artery calcifications. 4. Aortic Atherosclerosis (ICD10-I70.0) and Emphysema (ICD10-J43.9). Electronically Signed   By: Simonne Come M.D.   On: 05/14/2017 09:39   Dg Chest Portable 1 View  Result Date: 05/13/2017 CLINICAL DATA:  Pt here from dialysis with c/o central chest pain and dizziness. Pt did all but 30 mins of his dialysis. Denies SOB. Hx of HTN, diabetes, and CAD. Former smoker- quit 2012. EXAM: PORTABLE CHEST 1 VIEW COMPARISON:  04/07/2017 FINDINGS: Cardiac silhouette is normal in size. No mediastinal or hilar masses. No convincing adenopathy. Clear lungs.  No pleural effusion or pneumothorax. Skeletal structures are grossly intact. IMPRESSION: No active disease. Electronically Signed   By: Amie Portland M.D.   On: 05/13/2017 18:54      Scheduled Meds: . amLODipine  10 mg Oral Daily  . aspirin EC  81 mg Oral Daily  . atorvastatin  80 mg Oral q1800  . clopidogrel  75 mg Oral Daily  . isosorbide mononitrate  30 mg Oral Daily  . latanoprost  1 drop Both Eyes QHS  . metoprolol tartrate  50 mg Oral BID WC  . pantoprazole  40 mg Oral Daily   Continuous Infusions:   LOS: 0 days    Time spent in minutes: 35    Calvert Cantor, MD [facility] Pager: [email or web address] Password [email or web address] 05/14/2017, 3:17 PM

## 2017-05-15 DIAGNOSIS — R0789 Other chest pain: Principal | ICD-10-CM

## 2017-05-15 DIAGNOSIS — Z7982 Long term (current) use of aspirin: Secondary | ICD-10-CM | POA: Diagnosis not present

## 2017-05-15 DIAGNOSIS — Z955 Presence of coronary angioplasty implant and graft: Secondary | ICD-10-CM | POA: Diagnosis not present

## 2017-05-15 DIAGNOSIS — Z87891 Personal history of nicotine dependence: Secondary | ICD-10-CM | POA: Diagnosis not present

## 2017-05-15 DIAGNOSIS — I2510 Atherosclerotic heart disease of native coronary artery without angina pectoris: Secondary | ICD-10-CM

## 2017-05-15 DIAGNOSIS — E7800 Pure hypercholesterolemia, unspecified: Secondary | ICD-10-CM | POA: Diagnosis present

## 2017-05-15 DIAGNOSIS — R011 Cardiac murmur, unspecified: Secondary | ICD-10-CM | POA: Diagnosis present

## 2017-05-15 DIAGNOSIS — Z992 Dependence on renal dialysis: Secondary | ICD-10-CM | POA: Diagnosis not present

## 2017-05-15 DIAGNOSIS — R45851 Suicidal ideations: Secondary | ICD-10-CM | POA: Diagnosis present

## 2017-05-15 DIAGNOSIS — N186 End stage renal disease: Secondary | ICD-10-CM | POA: Diagnosis present

## 2017-05-15 DIAGNOSIS — E785 Hyperlipidemia, unspecified: Secondary | ICD-10-CM | POA: Diagnosis present

## 2017-05-15 DIAGNOSIS — M1990 Unspecified osteoarthritis, unspecified site: Secondary | ICD-10-CM | POA: Diagnosis present

## 2017-05-15 DIAGNOSIS — R079 Chest pain, unspecified: Secondary | ICD-10-CM | POA: Diagnosis not present

## 2017-05-15 DIAGNOSIS — F4325 Adjustment disorder with mixed disturbance of emotions and conduct: Secondary | ICD-10-CM | POA: Diagnosis not present

## 2017-05-15 DIAGNOSIS — Z86718 Personal history of other venous thrombosis and embolism: Secondary | ICD-10-CM | POA: Diagnosis not present

## 2017-05-15 DIAGNOSIS — Z7902 Long term (current) use of antithrombotics/antiplatelets: Secondary | ICD-10-CM | POA: Diagnosis not present

## 2017-05-15 DIAGNOSIS — Z8249 Family history of ischemic heart disease and other diseases of the circulatory system: Secondary | ICD-10-CM | POA: Diagnosis not present

## 2017-05-15 DIAGNOSIS — E1122 Type 2 diabetes mellitus with diabetic chronic kidney disease: Secondary | ICD-10-CM | POA: Diagnosis present

## 2017-05-15 DIAGNOSIS — T82858A Stenosis of vascular prosthetic devices, implants and grafts, initial encounter: Secondary | ICD-10-CM | POA: Diagnosis not present

## 2017-05-15 DIAGNOSIS — Z841 Family history of disorders of kidney and ureter: Secondary | ICD-10-CM | POA: Diagnosis not present

## 2017-05-15 DIAGNOSIS — K219 Gastro-esophageal reflux disease without esophagitis: Secondary | ICD-10-CM | POA: Diagnosis present

## 2017-05-15 DIAGNOSIS — I252 Old myocardial infarction: Secondary | ICD-10-CM | POA: Diagnosis not present

## 2017-05-15 DIAGNOSIS — N2581 Secondary hyperparathyroidism of renal origin: Secondary | ICD-10-CM | POA: Diagnosis present

## 2017-05-15 DIAGNOSIS — I120 Hypertensive chronic kidney disease with stage 5 chronic kidney disease or end stage renal disease: Secondary | ICD-10-CM | POA: Diagnosis present

## 2017-05-15 DIAGNOSIS — E876 Hypokalemia: Secondary | ICD-10-CM | POA: Diagnosis present

## 2017-05-15 DIAGNOSIS — F332 Major depressive disorder, recurrent severe without psychotic features: Secondary | ICD-10-CM | POA: Diagnosis present

## 2017-05-15 LAB — CBC
HCT: 29 % — ABNORMAL LOW (ref 39.0–52.0)
Hemoglobin: 9.3 g/dL — ABNORMAL LOW (ref 13.0–17.0)
MCH: 28.8 pg (ref 26.0–34.0)
MCHC: 32.1 g/dL (ref 30.0–36.0)
MCV: 89.8 fL (ref 78.0–100.0)
Platelets: 296 10*3/uL (ref 150–400)
RBC: 3.23 MIL/uL — ABNORMAL LOW (ref 4.22–5.81)
RDW: 14.3 % (ref 11.5–15.5)
WBC: 5.4 10*3/uL (ref 4.0–10.5)

## 2017-05-15 LAB — HEMOGLOBIN A1C
Hgb A1c MFr Bld: 5.7 % — ABNORMAL HIGH (ref 4.8–5.6)
Mean Plasma Glucose: 117 mg/dL

## 2017-05-15 LAB — BASIC METABOLIC PANEL
ANION GAP: 12 (ref 5–15)
BUN: 31 mg/dL — ABNORMAL HIGH (ref 6–20)
CALCIUM: 8 mg/dL — AB (ref 8.9–10.3)
CO2: 26 mmol/L (ref 22–32)
Chloride: 99 mmol/L — ABNORMAL LOW (ref 101–111)
Creatinine, Ser: 6.32 mg/dL — ABNORMAL HIGH (ref 0.61–1.24)
GFR, EST AFRICAN AMERICAN: 10 mL/min — AB (ref >60)
GFR, EST NON AFRICAN AMERICAN: 8 mL/min — AB (ref >60)
GLUCOSE: 112 mg/dL — AB (ref 65–99)
POTASSIUM: 3.4 mmol/L — AB (ref 3.5–5.1)
Sodium: 137 mmol/L (ref 135–145)

## 2017-05-15 NOTE — Progress Notes (Addendum)
Brief Consult Note:  Richard Barnett is a 62yo Richard Barnett who recently started HD on 04/20/17 at [facility] from the office.  PMH significant for HTN, DM II, h/o DVT, GERD, grade 2 diastolic dysfunction, mild-mod TR, mild MR, CAD s/p stent placement and medical non compliance.  Has been compliant with HD treatments and overall has been tolerating it well.  He has very little insight into his health.   He originally presented to the ED with CP and was admitted for observation due to elevated D dimer and concern for PE.  He was evaluated by behavioral health due to sucidal ideations and is being admitted to inpatient behavioral health unit.  We have been consulted for management of dialysis until he can be transferred to behavioral health.    Seen and examined at bedside. Admits to orthopnea and decreased.  Denies fever, chills, SOB, CP, n/v/d, dizziness, and metallic taste.  Physical exam reveals decreased breath sounds bilaterally, scleral icterus and trace pedal edema.    OP HD orders:  [facility]  MWF 4hrs, 400/800, 2K/2Ca, EDW 101.5kg, LU AVG Calcitriol 0.40mcg qHD Venofer 100mg  IV qHD - 8/10 completed No Heparin  Plan:  -Likely to be transferred to [facility] tomorrow, will plan for 1st shift HD if possible, if no other urgent/emergent need takes precident.   Virgina Norfolk, PA-C [facility] Pager [phone]  Pt seen, examined and agree w A/P as above.  Vinson Moselle MD [employer] pager [phone]   05/15/2017, 4:43 PM

## 2017-05-15 NOTE — Progress Notes (Signed)
PROGRESS NOTE    Richard Barnett   ZBM:[id number]  DOB: 1956-03-02  DOA: 05/13/2017 PCP: Patient, No Pcp Per   Brief Narrative:  Richard Barnett  a 62 y.o. Richard Barnett with medical history significant of ESRD-HD (MWF, justed started in 04/2017), hypertension, hyperlipidemia, diet-controlled diabetes, CAD, stent placement 01/2017, GERD, depression, DVT, who presents with chest pain. This was left sided chest pain which occurred while on dialysis, felt like a cramp ,  was severe (10/10) and non radiating. He tells me that he has been getting a similar pain when he takes deep breaths. No respiratory symptoms or fevers. He states that he hates himself and wants to kill himself. He has no thought out plan.    Subjective: No complaints of nausea, vomiting, constipation diarrhea, cough, dyspnea or dysuria. No other complaints.   Assessment & Plan:   Principal Problem:   Chest pain - worse when taking a deep breath- persistent yesterday while on dialysis but now intermittent again - d dimer was elevated: CTA chest negative, LE venous duplex negative - negative troponin - cardiology has evaluated him and feels that he does not need any further work up as he just had a Cardiac cath in 11/18 which did not reveal and concerning major stenosis. -cont ASA, Plavix and Statin - 3/10- chest pain has resolved    Active Problems:   Suicidal ideation - psych recommends inpatient psych admission- he has sitter at [facility] - he is medically stable for transfer to a psych facility    Hypokalemia - replaced      Hypertension - Norvasc, Imdur, Metoprolol, PRN Hydralazine    Hyperlipidemia - LDL 107- cont Lipitor    GERD (gastroesophageal reflux disease) - PPI    ESRD on dialysis (HCC) - he missed 30 min of dialysis yesterday- does not appear fluid overloaded- labs stable - have notified Nephrology- no need dialysis needs yet  AOCD - follow - Hb 9.8 today   DVT prophylaxis: Heparin Code Status: Full  code Family Communication:  Disposition Plan: home when stable Consultants:   cardiology Procedures:    Antimicrobials:  Anti-infectives (From admission, onward)   None     Objective: Vitals:   05/14/17 2005 05/15/17 0450 05/15/17 0854 05/15/17 1451  BP: 131/75 136/81 136/73 116/67  Pulse: 80 83 83 75  Resp: 16 16  18   Temp: 98.5 F (36.9 C) 98.6 F (37 C)  98.3 F (36.8 C)  TempSrc: Oral Oral  Oral  SpO2: 100% 100%  99%  Weight:  100.5 kg (221 lb 9 oz)    Height:        Intake/Output Summary (Last 24 hours) at 05/15/2017 1642 Last data filed at 05/15/2017 1500 Gross per 24 hour  Intake 882 ml  Output -  Net 882 ml   Filed Weights   05/13/17 2054 05/15/17 0450  Weight: 100.6 kg (221 lb 11.2 oz) 100.5 kg (221 lb 9 oz)    Examination: General exam: Appears comfortable  HEENT: PERRLA, oral mucosa moist, no sclera icterus or thrush Respiratory system: Clear to auscultation. Respiratory effort normal. Cardiovascular system: S1 & S2 heard, RRR.  No murmurs  Gastrointestinal system: Abdomen soft, non-tender, nondistended. Normal bowel sound. No organomegaly Central nervous system: Alert and oriented. No focal neurological deficits. Extremities: No cyanosis, clubbing or edema Skin: No rashes or ulcers Psychiatry:  Mood & affect appropriate.     Data Reviewed: I have personally reviewed following labs and imaging studies  CBC: Recent Labs  Lab 05/13/17 1845 05/13/17 1850 05/14/17 0708 05/15/17 0529  WBC 6.6  --  7.3 5.4  HGB 10.3* 11.6* 9.8* 9.3*  HCT 31.8* 34.0* 30.4* 29.0*  MCV 88.3  --  89.7 89.8  PLT 303  --  283 296   Basic Metabolic Panel: Recent Labs  Lab 05/13/17 1845 05/13/17 1850 05/14/17 0809 05/15/17 0529  NA  --  138 137 137  K  --  2.8* 3.5 3.4*  CL  --  94* 97* 99*  CO2  --   --  25 26  GLUCOSE  --  95 120* 112*  BUN  --  16 25* 31*  CREATININE  --  4.10* 5.36* 6.32*  CALCIUM  --   --  7.9* 8.0*  MG 1.8  --   --   --     GFR: Estimated Creatinine Clearance: 13.7 mL/min (A) (by C-G formula based on SCr of 6.32 mg/dL (H)). Liver Function Tests: No results for input(s): AST, ALT, ALKPHOS, BILITOT, PROT, ALBUMIN in the last 168 hours. No results for input(s): LIPASE, AMYLASE in the last 168 hours. No results for input(s): AMMONIA in the last 168 hours. Coagulation Profile: Recent Labs  Lab 05/13/17 1845  INR 0.98   Cardiac Enzymes: Recent Labs  Lab 05/13/17 2000 05/14/17 0216 05/14/17 0708  TROPONINI <0.03 <0.03 <0.03   BNP (last 3 results) No results for input(s): PROBNP in the last 8760 hours. HbA1C: Recent Labs    05/14/17 0216  HGBA1C 5.7*   CBG: Recent Labs  Lab 05/13/17 1930  GLUCAP 96   Lipid Profile: Recent Labs    05/14/17 0216  CHOL 186  HDL 33*  LDLCALC 107*  TRIG 230*  CHOLHDL 5.6   Thyroid Function Tests: No results for input(s): TSH, T4TOTAL, FREET4, T3FREE, THYROIDAB in the last 72 hours. Anemia Panel: No results for input(s): VITAMINB12, FOLATE, FERRITIN, TIBC, IRON, RETICCTPCT in the last 72 hours. Urine analysis:    Component Value Date/Time   COLORURINE YELLOW 04/07/2017 0301   APPEARANCEUR CLEAR 04/07/2017 0301   LABSPEC 1.013 04/07/2017 0301   PHURINE 6.0 04/07/2017 0301   GLUCOSEU 150 (A) 04/07/2017 0301   HGBUR MODERATE (A) 04/07/2017 0301   BILIRUBINUR NEGATIVE 04/07/2017 0301   KETONESUR NEGATIVE 04/07/2017 0301   PROTEINUR >=300 (A) 04/07/2017 0301   UROBILINOGEN 0.2 02/21/2015 0955   NITRITE NEGATIVE 04/07/2017 0301   LEUKOCYTESUR NEGATIVE 04/07/2017 0301   Sepsis Labs: @LABRCNTIP (procalcitonin:4,lacticidven:4) )No results found for this or any previous visit (from the past 240 hour(s)).       Radiology Studies: Ct Angio Chest Pe W Or Wo Contrast  Result Date: 05/14/2017 CLINICAL DATA:  Chest pain and shortness of breath for the past month. Evaluate for pulmonary embolism. EXAM: CT ANGIOGRAPHY CHEST WITH CONTRAST TECHNIQUE:  Multidetector CT imaging of the chest was performed using the standard protocol during bolus administration of intravenous contrast. Multiplanar CT image reconstructions and MIPs were obtained to evaluate the vascular anatomy. CONTRAST:  ISOVUE-370 IOPAMIDOL (ISOVUE-370) INJECTION 76% COMPARISON:  05/13/2017; 04/07/2017; 01/27/2017; 01/10/2016 FINDINGS: Vascular Findings: There is adequate opacification of the pulmonary arterial system with the main pulmonary artery measuring 352 Hounsfield units. There are no discrete filling defects within the pulmonary arterial tree to suggest pulmonary embolism. Normal caliber the main pulmonary artery. Borderline cardiomegaly. No pericardial effusion. Coronary artery calcifications. Atherosclerotic plaque within a normal caliber thoracic aorta. No definite thoracic aortic annular, dissection or periaortic stranding on this nongated examination. Conventional configuration of the aortic  arch. The branch vessels of the aortic arch appear patent throughout their imaged course. Review of the MIP images confirms the above findings. ---------------------------------------------------------------------------------- Nonvascular Findings: Mediastinum/Lymph Nodes: No bulky mediastinal, hilar or axillary lymphadenopathy. Lungs/Pleura: Minimal dependent subpleural ground-glass atelectasis. No discrete focal airspace opacities. No pleural effusion or pneumothorax. Mild to moderate apical predominant mixed centrilobular and paraseptal emphysematous change. No discrete pulmonary nodules. Upper abdomen: Limited early arterial phase evaluation of the upper abdomen demonstrates a small hiatal hernia. Musculoskeletal: No acute or aggressive osseous abnormalities. 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 Stigmata of mild DISH throughout the thoracic spine. Mild diffuse body wall anasarca. Imaged  portions of the thyroid gland are normal. IMPRESSION: 1. Mild-to-moderate apical predominant mixed centrilobular and paraseptal emphysematous change. Otherwise, no explanation for patient's shortness of breath. Specifically, no evidence of pulmonary embolism. 2. Borderline cardiomegaly. 3. Coronary artery calcifications. 4. Aortic Atherosclerosis (ICD10-I70.0) and Emphysema (ICD10-J43.9). Electronically Signed   By: Simonne Come M.D.   On: 05/14/2017 09:39   Dg Chest Portable 1 View  Result Date: 05/13/2017 CLINICAL DATA:  Pt here from dialysis with c/o central chest pain and dizziness. Pt did all but 30 mins of his dialysis. Denies SOB. Hx of HTN, diabetes, and CAD. Former smoker- quit 2012. EXAM: PORTABLE CHEST 1 VIEW COMPARISON:  04/07/2017 FINDINGS: Cardiac silhouette is normal in size. No mediastinal or hilar masses. No convincing adenopathy. Clear lungs.  No pleural effusion or pneumothorax. Skeletal structures are grossly intact. IMPRESSION: No active disease. Electronically Signed   By: Amie Portland M.D.   On: 05/13/2017 18:54      Scheduled Meds: . amLODipine  10 mg Oral Daily  . aspirin EC  81 mg Oral Daily  . atorvastatin  80 mg Oral q1800  . clopidogrel  75 mg Oral Daily  . heparin injection (subcutaneous)  5,000 Units Subcutaneous Q8H  . isosorbide mononitrate  30 mg Oral Daily  . latanoprost  1 drop Both Eyes QHS  . metoprolol tartrate  50 mg Oral BID WC  . pantoprazole  40 mg Oral Daily  . sertraline  50 mg Oral QHS   Continuous Infusions:   LOS: 0 days    Time spent in minutes: 35    Calvert Cantor, MD [facility] Pager: [email or web address] Password [facility] 05/15/2017, 4:42 PM

## 2017-05-15 NOTE — Progress Notes (Signed)
Progress Note  Patient Name: Richard Barnett Date of Encounter: 05/15/2017  Primary Cardiologist: No primary care provider on file.   Subjective     62 yo Richard Barnett with PMH of CAD, HTN, DMII, HL, ESRD on HD and noncompliance who presented with chest pain  D-dimer was elevated on admission.  CT angiogram of the chest revealed no evidence of pulmonary embolus.  He is not having any further episodes of chest pain.  Per the internal medicine team from yesterday, the patient has some suicidal ideation.  There is a plan to transfer him to behavioral health.  Inpatient Medications    Scheduled Meds: . amLODipine  10 mg Oral Daily  . aspirin EC  81 mg Oral Daily  . atorvastatin  80 mg Oral q1800  . clopidogrel  75 mg Oral Daily  . heparin injection (subcutaneous)  5,000 Units Subcutaneous Q8H  . isosorbide mononitrate  30 mg Oral Daily  . latanoprost  1 drop Both Eyes QHS  . metoprolol tartrate  50 mg Oral BID WC  . pantoprazole  40 mg Oral Daily  . sertraline  50 mg Oral QHS   Continuous Infusions:  PRN Meds: acetaminophen, ALPRAZolam, hydrALAZINE, nitroGLYCERIN, ondansetron (ZOFRAN) IV, oxyCODONE-acetaminophen, zolpidem   Vital Signs    Vitals:   05/14/17 1431 05/14/17 1714 05/14/17 2005 05/15/17 0450  BP: 124/73 113/69 131/75 136/81  Pulse: 76 79 80 83  Resp:   16 16  Temp: 97.6 F (36.4 C)  98.5 F (36.9 C) 98.6 F (37 C)  TempSrc: Oral  Oral Oral  SpO2:   100% 100%  Weight:    221 lb 9 oz (100.5 kg)  Height:        Intake/Output Summary (Last 24 hours) at 05/15/2017 0829 Last data filed at 05/14/2017 2200 Gross per 24 hour  Intake 1244 ml  Output -  Net 1244 ml   Filed Weights   05/13/17 2054 05/15/17 0450  Weight: 221 lb 11.2 oz (100.6 kg) 221 lb 9 oz (100.5 kg)    Telemetry    NSR 82  - Personally Reviewed  ECG     NSR - Personally Reviewed  Physical Exam   GEN:  Middle-aged gentleman.  In no acute distress. Neck: No JVD Cardiac: RRR, no murmurs,  rubs, or gallops.  Respiratory: Clear to auscultation bilaterally. GI: Soft, nontender, non-distended  MS: No edema; No deformity. Neuro:  Nonfocal  Psych: Normal affect   Labs    Chemistry Recent Labs  Lab 05/13/17 1850 05/14/17 0809 05/15/17 0529  NA 138 137 137  K 2.8* 3.5 3.4*  CL 94* 97* 99*  CO2  --  25 26  GLUCOSE 95 120* 112*  BUN 16 25* 31*  CREATININE 4.10* 5.36* 6.32*  CALCIUM  --  7.9* 8.0*  GFRNONAA  --  10* 8*  GFRAA  --  12* 10*  ANIONGAP  --  15 12     Hematology Recent Labs  Lab 05/13/17 1845 05/13/17 1850 05/14/17 0708 05/15/17 0529  WBC 6.6  --  7.3 5.4  RBC 3.60*  --  3.39* 3.23*  HGB 10.3* 11.6* 9.8* 9.3*  HCT 31.8* 34.0* 30.4* 29.0*  MCV 88.3  --  89.7 89.8  MCH 28.6  --  28.9 28.8  MCHC 32.4  --  32.2 32.1  RDW 13.5  --  14.1 14.3  PLT 303  --  283 296    Cardiac Enzymes Recent Labs  Lab 05/13/17 2000 05/14/17 0216  05/14/17 0708  TROPONINI <0.03 <0.03 <0.03    Recent Labs  Lab 05/13/17 1849  TROPIPOC 0.01     BNP Recent Labs  Lab 05/13/17 1845  BNP 28.1     DDimer  Recent Labs  Lab 05/13/17 2000  DDIMER 4.23*     Radiology    Ct Angio Chest Pe W Or Wo Contrast  Result Date: 05/14/2017 CLINICAL DATA:  Chest pain and shortness of breath for the past month. Evaluate for pulmonary embolism. EXAM: CT ANGIOGRAPHY CHEST WITH CONTRAST TECHNIQUE: Multidetector CT imaging of the chest was performed using the standard protocol during bolus administration of intravenous contrast. Multiplanar CT image reconstructions and MIPs were obtained to evaluate the vascular anatomy. CONTRAST:  ISOVUE-370 IOPAMIDOL (ISOVUE-370) INJECTION 76% COMPARISON:  05/13/2017; 04/07/2017; 01/27/2017; 01/10/2016 FINDINGS: Vascular Findings: There is adequate opacification of the pulmonary arterial system with the main pulmonary artery measuring 352 Hounsfield units. There are no discrete filling defects within the pulmonary arterial tree to  suggest pulmonary embolism. Normal caliber the main pulmonary artery. Borderline cardiomegaly. No pericardial effusion. Coronary artery calcifications. Atherosclerotic plaque within a normal caliber thoracic aorta. No definite thoracic aortic annular, dissection or periaortic stranding on this nongated examination. Conventional configuration of the aortic arch. The branch vessels of the aortic arch appear patent throughout their imaged course. Review of the MIP images confirms the above findings. ---------------------------------------------------------------------------------- Nonvascular Findings: Mediastinum/Lymph Nodes: No bulky mediastinal, hilar or axillary lymphadenopathy. Lungs/Pleura: Minimal dependent subpleural ground-glass atelectasis. No discrete focal airspace opacities. No pleural effusion or pneumothorax. Mild to moderate apical predominant mixed centrilobular and paraseptal emphysematous change. No discrete pulmonary nodules. Upper abdomen: Limited early arterial phase evaluation of the upper abdomen demonstrates a small hiatal hernia. Musculoskeletal: No acute or aggressive osseous abnormalities. 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 Stigmata of mild DISH throughout the thoracic spine. Mild diffuse body wall anasarca. Imaged portions of the thyroid gland are normal. IMPRESSION: 1. Mild-to-moderate apical predominant mixed centrilobular and paraseptal emphysematous change. Otherwise, no explanation for patient's shortness of breath. Specifically, no evidence of pulmonary embolism. 2. Borderline cardiomegaly. 3. Coronary artery calcifications. 4. Aortic Atherosclerosis (ICD10-I70.0) and Emphysema (ICD10-J43.9). Electronically Signed   By: Simonne Come M.D.   On: 05/14/2017 09:39   Dg Chest Portable 1 View  Result Date: 05/13/2017 CLINICAL DATA:  Pt here from dialysis with c/o central chest pain and  dizziness. Pt did all but 30 mins of his dialysis. Denies SOB. Hx of HTN, diabetes, and CAD. Former smoker- quit 2012. EXAM: PORTABLE CHEST 1 VIEW COMPARISON:  04/07/2017 FINDINGS: Cardiac silhouette is normal in size. No mediastinal or hilar masses. No convincing adenopathy. Clear lungs.  No pleural effusion or pneumothorax. Skeletal structures are grossly intact. IMPRESSION: No active disease. Electronically Signed   By: Amie Portland M.D.   On: 05/13/2017 18:54    Cardiac Studies     Patient Profile     62 y.o. Rich with a known history of coronary artery disease.  He presented with atypical chest pain.  Assessment & Plan    1.  Coronary artery disease: The patient has known coronary artery disease primarily involving a first marginal branch.  The lesion is not amenable to angioplasty.  Dr. [place] has recommended continued medical therapy.  He presented with atypical chest pain and has ruled out for myocardial infarction.  D-dimer was elevated in the emergency room.  CT angiogram of the chest was  negative for pulmonary embolus.  No further workup needed at this time.  We will sign off.  Please call for questions  For questions or updates, please contact [facility] Please consult [email or web address] for contact info under Cardiology/STEMI.      Signed, Kristeen Miss, MD  05/15/2017, 8:29 AM

## 2017-05-16 ENCOUNTER — Inpatient Hospital Stay (HOSPITAL_COMMUNITY): Payer: Medicaid Other

## 2017-05-16 ENCOUNTER — Inpatient Hospital Stay: Admission: AD | Admit: 2017-05-16 | Payer: Medicaid Other | Source: Intra-hospital | Admitting: Psychiatry

## 2017-05-16 ENCOUNTER — Encounter (HOSPITAL_COMMUNITY): Payer: Self-pay | Admitting: Interventional Radiology

## 2017-05-16 DIAGNOSIS — F332 Major depressive disorder, recurrent severe without psychotic features: Secondary | ICD-10-CM

## 2017-05-16 DIAGNOSIS — T82898A Other specified complication of vascular prosthetic devices, implants and grafts, initial encounter: Secondary | ICD-10-CM

## 2017-05-16 HISTORY — PX: IR DIALY SHUNT INTRO NEEDLE/INTRACATH INITIAL W/IMG LEFT: IMG6102

## 2017-05-16 LAB — RENAL FUNCTION PANEL
ALBUMIN: 3.6 g/dL (ref 3.5–5.0)
Anion gap: 12 (ref 5–15)
BUN: 36 mg/dL — ABNORMAL HIGH (ref 6–20)
CALCIUM: 8.7 mg/dL — AB (ref 8.9–10.3)
CO2: 26 mmol/L (ref 22–32)
CREATININE: 6.9 mg/dL — AB (ref 0.61–1.24)
Chloride: 100 mmol/L — ABNORMAL LOW (ref 101–111)
GFR calc non Af Amer: 8 mL/min — ABNORMAL LOW (ref >60)
GFR, EST AFRICAN AMERICAN: 9 mL/min — AB (ref >60)
Glucose, Bld: 117 mg/dL — ABNORMAL HIGH (ref 65–99)
Phosphorus: 4.4 mg/dL (ref 2.5–4.6)
Potassium: 3.6 mmol/L (ref 3.5–5.1)
SODIUM: 138 mmol/L (ref 135–145)

## 2017-05-16 LAB — CBC
HCT: 28.2 % — ABNORMAL LOW (ref 39.0–52.0)
Hemoglobin: 9.1 g/dL — ABNORMAL LOW (ref 13.0–17.0)
MCH: 29 pg (ref 26.0–34.0)
MCHC: 32.3 g/dL (ref 30.0–36.0)
MCV: 89.8 fL (ref 78.0–100.0)
PLATELETS: 301 10*3/uL (ref 150–400)
RBC: 3.14 MIL/uL — AB (ref 4.22–5.81)
RDW: 14.3 % (ref 11.5–15.5)
WBC: 5.7 10*3/uL (ref 4.0–10.5)

## 2017-05-16 LAB — HEPATITIS B SURFACE ANTIGEN: HEP B S AG: NEGATIVE

## 2017-05-16 MED ORDER — HEPARIN SODIUM (PORCINE) 1000 UNIT/ML DIALYSIS
1000.0000 [IU] | INTRAMUSCULAR | Status: DC | PRN
  Filled 2017-05-16: qty 1

## 2017-05-16 MED ORDER — DARBEPOETIN ALFA 60 MCG/0.3ML IJ SOSY
PREFILLED_SYRINGE | INTRAMUSCULAR | Status: AC
  Filled 2017-05-16: qty 0.3

## 2017-05-16 MED ORDER — SERTRALINE HCL 50 MG PO TABS: 50.0000 mg | ORAL_TABLET | Freq: Every day | ORAL | Status: DC

## 2017-05-16 MED ORDER — DARBEPOETIN ALFA 60 MCG/0.3ML IJ SOSY
60.0000 ug | PREFILLED_SYRINGE | INTRAMUSCULAR | Status: DC
  Administered 2017-05-16: 60 ug via INTRAVENOUS
  Filled 2017-05-16: qty 0.3

## 2017-05-16 MED ORDER — LIDOCAINE HCL (PF) 1 % IJ SOLN
5.0000 mL | INTRAMUSCULAR | Status: DC | PRN
Start: 2017-05-16 — End: 2017-05-16

## 2017-05-16 MED ORDER — PENTAFLUOROPROP-TETRAFLUOROETH EX AERO: 1.0000 "application " | INHALATION_SPRAY | CUTANEOUS | Status: DC | PRN

## 2017-05-16 MED ORDER — SODIUM CHLORIDE 0.9 % IV SOLN: 100.0000 mL | INTRAVENOUS | Status: DC | PRN

## 2017-05-16 MED ORDER — IOPAMIDOL (ISOVUE-300) INJECTION 61%
INTRAVENOUS | Status: AC
  Administered 2017-05-16: 60 mL
  Filled 2017-05-16: qty 100

## 2017-05-16 MED ORDER — ALTEPLASE 2 MG IJ SOLR: 2.0000 mg | Freq: Once | INTRAMUSCULAR | Status: DC | PRN

## 2017-05-16 MED ORDER — LIDOCAINE-PRILOCAINE 2.5-2.5 % EX CREA: 1.0000 "application " | TOPICAL_CREAM | CUTANEOUS | Status: DC | PRN

## 2017-05-16 NOTE — Progress Notes (Signed)
PROGRESS NOTE    Richard Barnett   AYT:[id number]  DOB: June 07, 1955  DOA: 05/13/2017 PCP: Patient, No Pcp Per   Brief Narrative:  Richard Barnett  a 62 y.o. Richard Barnett with medical history significant of ESRD-HD (MWF, justed started in 04/2017), hypertension, hyperlipidemia, diet-controlled diabetes, CAD, stent placement 01/2017, GERD, depression, DVT, who presents with chest pain. This was left sided chest pain which occurred while on dialysis, felt like a cramp ,  was severe (10/10) and non radiating. He tells me that he has been getting a similar pain when he takes deep breaths. No respiratory symptoms or fevers. He states that he hates himself and wants to kill himself. He has no thought out plan.    Subjective: He has no physical complaints- upset about having to take dialysis.   Assessment & Plan:   Principal Problem:   Chest pain - worse when taking a deep breath- persistent yesterday while on dialysis but now intermittent again - d dimer was elevated: CTA chest negative, LE venous duplex negative - negative troponin - cardiology has evaluated him and feels that he does not need any further work up as he just had a Cardiac cath in 11/18 which did not reveal and concerning major stenosis. -cont ASA, Plavix and Statin - 3/10- chest pain has resolved    Active Problems:   Suicidal ideation - psych recommends inpatient psych admission- he has sitter at [facility] - Zoloft started by psych     ESRD on dialysis   - he was being dialyzed today when it was noted he may have a stenosis of his fistula- dialysis was not completed -  Nephrology has consulted IR for a shuntogram    Hypokalemia - replaced      Hypertension - Norvasc, Imdur, Metoprolol, PRN Hydralazine    Hyperlipidemia - LDL 107- cont Lipitor    GERD (gastroesophageal reflux disease) - PPI  AOCD - follow - Hb 9.8 today   DVT prophylaxis: Heparin Code Status: Full code Family Communication:  Disposition Plan: home when  stable Consultants:   cardiology Procedures:    Antimicrobials:  Anti-infectives (From admission, onward)   None     Objective: Vitals:   05/16/17 0900 05/16/17 0930 05/16/17 1000 05/16/17 1030  BP: (!) 158/93 (!) 169/95 (!) 160/80 (!) 146/90  Pulse: 71 72 74 75  Resp: 19 18 18 18   Temp:      TempSrc:      SpO2:      Weight:      Height:        Intake/Output Summary (Last 24 hours) at 05/16/2017 1207 Last data filed at 05/15/2017 2316 Gross per 24 hour  Intake 780 ml  Output -  Net 780 ml   Filed Weights   05/15/17 0450 05/16/17 0520 05/16/17 0810  Weight: 100.5 kg (221 lb 9 oz) 101.3 kg (223 lb 6.4 oz) 101.4 kg (223 lb 8.7 oz)    Examination: General exam: Appears comfortable  HEENT: PERRLA, oral mucosa moist, no sclera icterus or thrush Respiratory system: Clear to auscultation. Respiratory effort normal. Cardiovascular system: S1 & S2 heard,  No murmurs  Gastrointestinal system: Abdomen soft, non-tender, nondistended. Normal bowel sound. No organomegaly Central nervous system: Alert and oriented. No focal neurological deficits. Extremities: No cyanosis, clubbing or edema Skin: No rashes or ulcers Psychiatry:  Mood & affect appropriate.     Data Reviewed: I have personally reviewed following labs and imaging studies  CBC: Recent Labs  Lab 05/13/17 1845  05/13/17 1850 05/14/17 0708 05/15/17 0529 05/16/17 0359  WBC 6.6  --  7.3 5.4 5.7  HGB 10.3* 11.6* 9.8* 9.3* 9.1*  HCT 31.8* 34.0* 30.4* 29.0* 28.2*  MCV 88.3  --  89.7 89.8 89.8  PLT 303  --  283 296 301   Basic Metabolic Panel: Recent Labs  Lab 05/13/17 1845 05/13/17 1850 05/14/17 0809 05/15/17 0529 05/16/17 0738  NA  --  138 137 137 138  K  --  2.8* 3.5 3.4* 3.6  CL  --  94* 97* 99* 100*  CO2  --   --  25 26 26   GLUCOSE  --  95 120* 112* 117*  BUN  --  16 25* 31* 36*  CREATININE  --  4.10* 5.36* 6.32* 6.90*  CALCIUM  --   --  7.9* 8.0* 8.7*  MG 1.8  --   --   --   --   PHOS  --   --    --   --  4.4   GFR: Estimated Creatinine Clearance: 12.6 mL/min (A) (by C-G formula based on SCr of 6.9 mg/dL (H)). Liver Function Tests: Recent Labs  Lab 05/16/17 0738  ALBUMIN 3.6   No results for input(s): LIPASE, AMYLASE in the last 168 hours. No results for input(s): AMMONIA in the last 168 hours. Coagulation Profile: Recent Labs  Lab 05/13/17 1845  INR 0.98   Cardiac Enzymes: Recent Labs  Lab 05/13/17 2000 05/14/17 0216 05/14/17 0708  TROPONINI <0.03 <0.03 <0.03   BNP (last 3 results) No results for input(s): PROBNP in the last 8760 hours. HbA1C: Recent Labs    05/14/17 0216  HGBA1C 5.7*   CBG: Recent Labs  Lab 05/13/17 1930  GLUCAP 96   Lipid Profile: Recent Labs    05/14/17 0216  CHOL 186  HDL 33*  LDLCALC 107*  TRIG 230*  CHOLHDL 5.6   Thyroid Function Tests: No results for input(s): TSH, T4TOTAL, FREET4, T3FREE, THYROIDAB in the last 72 hours. Anemia Panel: No results for input(s): VITAMINB12, FOLATE, FERRITIN, TIBC, IRON, RETICCTPCT in the last 72 hours. Urine analysis:    Component Value Date/Time   COLORURINE YELLOW 04/07/2017 0301   APPEARANCEUR CLEAR 04/07/2017 0301   LABSPEC 1.013 04/07/2017 0301   PHURINE 6.0 04/07/2017 0301   GLUCOSEU 150 (A) 04/07/2017 0301   HGBUR MODERATE (A) 04/07/2017 0301   BILIRUBINUR NEGATIVE 04/07/2017 0301   KETONESUR NEGATIVE 04/07/2017 0301   PROTEINUR >=300 (A) 04/07/2017 0301   UROBILINOGEN 0.2 02/21/2015 0955   NITRITE NEGATIVE 04/07/2017 0301   LEUKOCYTESUR NEGATIVE 04/07/2017 0301   Sepsis Labs: @LABRCNTIP (procalcitonin:4,lacticidven:4) )No results found for this or any previous visit (from the past 240 hour(s)).       Radiology Studies: No results found.    Scheduled Meds: . amLODipine  10 mg Oral Daily  . aspirin EC  81 mg Oral Daily  . atorvastatin  80 mg Oral q1800  . clopidogrel  75 mg Oral Daily  . darbepoetin (ARANESP) injection - DIALYSIS  60 mcg Intravenous Q Mon-HD    . heparin injection (subcutaneous)  5,000 Units Subcutaneous Q8H  . isosorbide mononitrate  30 mg Oral Daily  . latanoprost  1 drop Both Eyes QHS  . metoprolol tartrate  50 mg Oral BID WC  . pantoprazole  40 mg Oral Daily  . sertraline  50 mg Oral QHS   Continuous Infusions: . sodium chloride    . sodium chloride       LOS: 1  day    Time spent in minutes: 35    Calvert Cantor, MD [facility] Pager: [email or web address] Password [facility] 05/16/2017, 12:07 PM

## 2017-05-16 NOTE — Progress Notes (Signed)
Visited with patient.  He expressed frustrations- life. [place] and things are very hard for him.  He tries to make it one day at a time.  The hospital has been good to him. I offered encouragement for him during this time.  York Spaniel he has no family.  He did not mention wanting life to end but very frustrated trying to figure out what next steps might be.  Had prayer with him. Phebe Colla, Chaplain   05/16/17 1600  Clinical Encounter Type  Visited With Patient  Visit Type Initial  Referral From Nurse  Consult/Referral To Chaplain  Spiritual Encounters  Spiritual Needs Prayer;Emotional  Stress Factors  Patient Stress Factors Other (Comment) (Lots of things-didnt want to talk about it)  Family Stress Factors (doesnt have anybody)

## 2017-05-16 NOTE — Progress Notes (Addendum)
4:52 pm CSW met with patient at bedside and explained recommendation for inpatient psychiatric placement. Patient indicated he will voluntarily admit to treatment and agreed to sign voluntary consent form. Patient signed form and CSW witnessed form. CSW faxed form to [facility] at [facility].  4:11 pm CSW received call from [facility]; they will be able to accept patient when medically ready. CSW to confirm patient's voluntary admission status and have patient sign voluntary admission form prior to discharge. CSW to follow and support with discharge.  Estanislado Emms, [place]

## 2017-05-16 NOTE — Procedures (Signed)
Interventional Radiology Procedure Note  Procedure: Left arm AV shuntogram  Complications: None  Estimated Blood Loss: None  Findings: Left upper arm graft widely patent.  No significant anastamotic stenoses. 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 Indication for central venous angioplasty.  Patient requests conscious sedation and has not been NPO. Will make NPO after MN and proceed with central venous angioplasty tomorrow.  Jodi Marble. Fredia Sorrow, M.D Pager:  [phone]

## 2017-05-16 NOTE — Progress Notes (Signed)
   Patient ID: Varun OTHOR VINES, Domonic   DOB: 1955-08-13, 62 y.o.   MRN: [id number]   Hx HTN; CAD; DM; depression (suicidal-- on watch now) Left arm dialysis graft with slow flow ESRD M/W/F dialysis New to dialysis Request made for shuntogram per Dr Marisue Humble  I have seen pt He has consented for shuntogram with possible angioplasty/stnet placement He is not npo

## 2017-05-16 NOTE — BH Assessment (Signed)
Patient has been accepted to [facility].  Accepting physician is Dr. Jennet Maduro.  Attending Physician will be Dr. Johnella Moloney.  Patient has been assigned to room 319, by [facility] Charge Nurse Gigi.  Call report to [phone].  Representative/Transfer Coordinator is Warden/ranger Patient pre-admitted by [facility] Patient Access Mertie Clause)  [place] Staff Darl Pikes, Child psychotherapist) made aware of acceptance.  Per Child psychotherapist, patient will not transport today to [facility] due to concerns with his fistula. It is the plan for him to come tomorrow (05/17/2017).

## 2017-05-16 NOTE — Procedures (Addendum)
I was present at this dialysis session. I have reviewed the session itself and made appropriate changes.   3K bath, 2.5L UF goal 101.4kg standing weight this AM likely to go below EDW.  BP up to start HD.   Hb 9.1.  Not yet on ESA as outpt, start Aranesp today.   UPDATE: had some cannulation difficulty today, now with high AP, esp venous.  Suspect stenosis. Will req IR to do shuntogram.   Filed Weights   05/13/17 2054 05/15/17 0450 05/16/17 0520  Weight: 100.6 kg (221 lb 11.2 oz) 100.5 kg (221 lb 9 oz) 101.3 kg (223 lb 6.4 oz)    Recent Labs  Lab 05/15/17 0529  NA 137  K 3.4*  CL 99*  CO2 26  GLUCOSE 112*  BUN 31*  CREATININE 6.32*  CALCIUM 8.0*    Recent Labs  Lab 05/14/17 0708 05/15/17 0529 05/16/17 0359  WBC 7.3 5.4 5.7  HGB 9.8* 9.3* 9.1*  HCT 30.4* 29.0* 28.2*  MCV 89.7 89.8 89.8  PLT 283 296 301    Scheduled Meds: . amLODipine  10 mg Oral Daily  . aspirin EC  81 mg Oral Daily  . atorvastatin  80 mg Oral q1800  . clopidogrel  75 mg Oral Daily  . heparin injection (subcutaneous)  5,000 Units Subcutaneous Q8H  . isosorbide mononitrate  30 mg Oral Daily  . latanoprost  1 drop Both Eyes QHS  . metoprolol tartrate  50 mg Oral BID WC  . pantoprazole  40 mg Oral Daily  . sertraline  50 mg Oral QHS   Continuous Infusions: PRN Meds:.acetaminophen, ALPRAZolam, hydrALAZINE, nitroGLYCERIN, ondansetron (ZOFRAN) IV, oxyCODONE-acetaminophen, zolpidem   Sabra Heck  MD 05/16/2017, 8:34 AM

## 2017-05-16 NOTE — Progress Notes (Signed)
CSW made referral to [facility]; [facility] is unable to take patient as patient requires dialysis. Per weekend CSW, patient was agreeable to admit to [facility] voluntarily; CSW to confirm with patient and obtain voluntary consent form. CSW awaiting call back from [place] after they review referral. CSW to support with psychiatric inpatient placement when bed identified.   Abigail Butts, LCSWA [phone]

## 2017-05-17 ENCOUNTER — Inpatient Hospital Stay (HOSPITAL_COMMUNITY): Payer: Medicaid Other

## 2017-05-17 ENCOUNTER — Encounter (HOSPITAL_COMMUNITY): Payer: Self-pay | Admitting: Interventional Radiology

## 2017-05-17 DIAGNOSIS — T82898D Other specified complication of vascular prosthetic devices, implants and grafts, subsequent encounter: Secondary | ICD-10-CM

## 2017-05-17 HISTORY — PX: IR AV DIALY SHUNT INTRO NEEDLE/INTRACATH INITIAL W/PTA/IMG LEFT: IMG6103

## 2017-05-17 HISTORY — PX: IR US GUIDE VASC ACCESS LEFT: IMG2389

## 2017-05-17 HISTORY — PX: IR PTA ADDL CENTRAL DIALYSIS SEG THRU DIALY CIRCUIT LEFT: IMG6108

## 2017-05-17 LAB — CBC
HCT: 28.1 % — ABNORMAL LOW (ref 39.0–52.0)
HEMOGLOBIN: 8.9 g/dL — AB (ref 13.0–17.0)
MCH: 28.3 pg (ref 26.0–34.0)
MCHC: 31.7 g/dL (ref 30.0–36.0)
MCV: 89.5 fL (ref 78.0–100.0)
Platelets: 310 10*3/uL (ref 150–400)
RBC: 3.14 MIL/uL — AB (ref 4.22–5.81)
RDW: 14.4 % (ref 11.5–15.5)
WBC: 8.6 10*3/uL (ref 4.0–10.5)

## 2017-05-17 MED ORDER — MIDAZOLAM HCL 2 MG/2ML IJ SOLN
INTRAMUSCULAR | Status: AC | PRN
  Administered 2017-05-17: 1 mg via INTRAVENOUS

## 2017-05-17 MED ORDER — LIDOCAINE HCL 1 % IJ SOLN
INTRAMUSCULAR | Status: AC | PRN
  Administered 2017-05-17: 5 mL

## 2017-05-17 MED ORDER — FENTANYL CITRATE (PF) 100 MCG/2ML IJ SOLN
INTRAMUSCULAR | Status: AC
  Filled 2017-05-17: qty 4

## 2017-05-17 MED ORDER — MIDAZOLAM HCL 2 MG/2ML IJ SOLN
INTRAMUSCULAR | Status: AC
  Filled 2017-05-17: qty 4

## 2017-05-17 MED ORDER — RENA-VITE PO TABS
1.0000 | ORAL_TABLET | Freq: Every day | ORAL | Status: DC
  Administered 2017-05-17 – 2017-05-20 (×4): 1 via ORAL
  Filled 2017-05-17 (×4): qty 1

## 2017-05-17 MED ORDER — SODIUM CHLORIDE 0.9 % IV SOLN
250.0000 mg | INTRAVENOUS | Status: AC
  Administered 2017-05-18: 250 mg via INTRAVENOUS
  Filled 2017-05-17 (×2): qty 20

## 2017-05-17 MED ORDER — LIDOCAINE HCL 1 % IJ SOLN
INTRAMUSCULAR | Status: AC
  Filled 2017-05-17: qty 20

## 2017-05-17 MED ORDER — FENTANYL CITRATE (PF) 100 MCG/2ML IJ SOLN
INTRAMUSCULAR | Status: AC | PRN
  Administered 2017-05-17 (×2): 50 ug via INTRAVENOUS

## 2017-05-17 MED ORDER — IOPAMIDOL (ISOVUE-300) INJECTION 61%
INTRAVENOUS | Status: AC
  Administered 2017-05-17: 50 mL
  Filled 2017-05-17: qty 100

## 2017-05-17 MED ORDER — MIDAZOLAM HCL 5 MG/5ML IJ SOLN
INTRAMUSCULAR | Status: AC | PRN
  Administered 2017-05-17: 1 mg via INTRAVENOUS

## 2017-05-17 NOTE — Procedures (Signed)
Interventional Radiology Procedure Note  Procedure: US guided circuit access.  Shunt-ogram.  Balloon angioplasty of stenosis at the outflow, 85mm & 74mm, with 2mm Bard Lutonix treatment.  .  Complications: None  Recommendations:  - Ok to use - Do not submerge for 7 days - Routine care  - may remove the z-stitch at next dialysis  Signed,  Yvone Neu. Loreta Ave, DO

## 2017-05-17 NOTE — Sedation Documentation (Signed)
Patient is resting comfortably. 

## 2017-05-17 NOTE — BH Assessment (Signed)
This Clinical research associate informed [facility] SW Darl Pikes, per Charge Nurse Dedra Skeens, we cannot hold patient bed until tomorrow. Darl Pikes stated she would follow up with Dr. at [facility] about procedure and make new referral for placement tomorrow.

## 2017-05-17 NOTE — Consult Note (Signed)
Chief Complaint: Patient was seen in consultation today for malfunction of left upper arm dialysis graft   Referring Physician(s): Dr. Marisue Humble  Supervising Physician: Gilmer Mor  Patient Status: [facility] - In-pt  History of Present Illness: Richard Barnett is a 62 y.o. Geo with past medical history of CAD, HTN DVT, MI, and CKD on dialysis via left arm graft who presents with poor cannulation and slow flow during dialysis.   Shuntogram performed yesterday by Dr. Fredia Sorrow and showed: The shunt study demonstrates central venous stenoses at the juncture of the left subclavian and brachiocephalic veins and at the level of the central left brachiocephalic vein. Abundant collateral veins are identified in the neck and chest.   Patient now NPO for possible intervention today.   Past Medical History:  Diagnosis Date  . Arthritis    "right leg" (08/22/2014)  . CAD (coronary artery disease)    a. CAD s/p 2 stent placement in [place] 2016. b. Neg nuc 05/2014 & 01/2016.  Marland Kitchen Chest pain 01/2017  . CKD (chronic kidney disease), stage IV (HCC)    [name] 08/22/2014  . Daily headache   . Depression    "I always get that" (08/22/2014)  . Diabetes mellitus (HCC)   . DVT (deep venous thrombosis) (HCC) ?   RLE  . GERD (gastroesophageal reflux disease)   . Heart murmur   . Hypercholesterolemia   . Hypertension   . Myocardial infarction (HCC)   . Wears glasses     Past Surgical History:  Procedure Laterality Date  . AV FISTULA PLACEMENT Left 10/01/2016   Procedure: INSERTION OF 4-7MM X 45CM ARTERIOVENOUS (AV) GORE-TEX GRAFT ARM;  Surgeon: Chuck Hint, MD;  Location: [facility] OR;  Service: Vascular;  Laterality: Left;  . CORONARY ANGIOPLASTY WITH STENT PLACEMENT     "1 + 1"  . DRAINAGE AND CLOSURE OF LYMPHOCELE Left 10/29/2016   Procedure: EVACUATION OF LYMPHOCELE LEFT ARM ARTERIOVENOUS GRAFT;  Surgeon: Chuck Hint, MD;  Location: [facility] OR;  Service: Vascular;  Laterality: Left;  .  IR DIALY SHUNT INTRO NEEDLE/INTRACATH INITIAL W/IMG LEFT Left 05/16/2017  . LEFT HEART CATH AND CORONARY ANGIOGRAPHY N/A 01/14/2017   Procedure: LEFT HEART CATH AND CORONARY ANGIOGRAPHY;  Surgeon: Swaz[place], Peter M, MD;  Location: [facility] INVASIVE CV LAB;  Service: Cardiovascular;  Laterality: N/A;    Allergies: Penicillins  Medications: Prior to Admission medications   Medication Sig Start Date End Date Taking? Authorizing Provider  amLODipine (NORVASC) 10 MG tablet Take 1 tablet (10 mg total) by mouth daily. 01/30/17 05/14/17 Yes Chundi, Sherlyn Lees, MD  aspirin 81 MG EC tablet Take 1 tablet (81 mg total) by mouth daily. Swallow whole. 02/10/16  Yes Massie Maroon, FNP  atorvastatin (LIPITOR) 80 MG tablet Take 1 tablet (80 mg total) by mouth daily at 6 PM. 01/29/17  Yes Chundi, Vahini, MD  Brinzolamide-Brimonidine [facility]) 1-0.2 % SUSP Place 1 drop into both eyes 2 (two) times daily.   Yes [provider]  clopidogrel (PLAVIX) 75 MG tablet Take 1 tablet (75 mg total) by mouth daily. 01/30/17  Yes Chundi, Sherlyn Lees, MD  isosorbide mononitrate (IMDUR) 30 MG 24 hr tablet Take 1 tablet (30 mg total) by mouth daily. 01/29/17 05/14/17 Yes Chundi, Vahini, MD  latanoprost (XALATAN) 0.005 % ophthalmic solution Place 1 drop into both eyes at bedtime.   Yes [provider]  metoprolol tartrate (LOPRESSOR) 50 MG tablet Take 1 tablet (50 mg total) by mouth 2 (two) times daily with a meal.  01/29/17 05/14/17 Yes Chundi, Vahini, MD  nitroGLYCERIN (NITROSTAT) 0.4 MG SL tablet DISSOLVE 1 TABLET UNDER THE TONGUE AS NEEDED FOR CHEST PAIN. REPEAT AS NEEDED EVERY 5 MINUTES UP TO A TOTAL OF 3 DOSES Patient taking differently: DISSOLVE 1 TABLET (0.4 MG) UNDER THE TONGUE AS NEEDED FOR CHEST PAIN. REPEAT AS NEEDED EVERY 5 MINUTES UP TO A TOTAL OF 3 DOSES 12/15/15  Yes Henrietta Hoover, NP  oxyCODONE-acetaminophen (PERCOCET/ROXICET) 5-325 MG tablet Take 1 tablet by mouth every 6 (six) hours as needed. Patient taking  differently: Take 1 tablet by mouth every 6 (six) hours as needed. Severe pain 10/29/16  Yes Clinton Gallant M, PA-C  sertraline (ZOLOFT) 50 MG tablet Take 1 tablet (50 mg total) by mouth at bedtime. 05/16/17   Calvert Cantor, MD     Family History  Problem Relation Age of Onset  . Hypertension Mother   . Kidney disease Mother   . Heart disease Mother   . Heart disease Father   . Hypertension Father     Social History   Socioeconomic History  . Marital status: Divorced    Spouse name: None  . Number of children: None  . Years of education: None  . Highest education level: None  Social Needs  . Financial resource strain: None  . Food insecurity - worry: None  . Food insecurity - inability: None  . Transportation needs - medical: None  . Transportation needs - non-medical: None  Occupational History  . None  Tobacco Use  . Smoking status: Former Smoker    Packs/day: 0.00    Years: 40.00    Pack years: 0.00    Types: Cigarettes    Last attempt to quit: 08/29/2010    Years since quitting: 6.7  . Smokeless tobacco: Never Used  . Tobacco comment: "quit smoking cigarettes in  2011" 10/27/16  Substance and Sexual Activity  . Alcohol use: No    Comment: 10/27/16 "stopped in ~ 2011"  . Drug use: No    Comment: 10/27/16 "stopped in ~ 2011; all types of drugs""  . Sexual activity: No  Other Topics Concern  . None  Social History Narrative  . None     Review of Systems: A 12 point ROS discussed and pertinent positives are indicated in the HPI above.  All other systems are negative.  Review of Systems  Constitutional: Negative for fatigue and fever.  Respiratory: Negative for cough and shortness of breath.   Cardiovascular: Negative for chest pain.  Psychiatric/Behavioral: Negative for behavioral problems and confusion.    Vital Signs: BP (!) 155/88   Pulse 82   Temp 98.2 F (36.8 C) (Oral)   Resp 18   Ht 5\' 7"  (1.702 m)   Wt 220 lb (99.8 kg)   SpO2 99%   BMI 34.46  kg/m   Physical Exam  Constitutional: He is oriented to person, place, and time. He appears well-developed.  Cardiovascular: Normal rate and regular rhythm.  Pulmonary/Chest: Effort normal and breath sounds normal. No respiratory distress.  Musculoskeletal:  Left upper arm graft.  Palpable thrill.   Neurological: He is alert and oriented to person, place, and time.  Skin: Skin is warm and dry.  Psychiatric: He has a normal mood and affect. His behavior is normal.  Nursing note and vitals reviewed.    MD Evaluation Airway: WNL Heart: WNL Abdomen: WNL Chest/ Lungs: WNL ASA  Classification: 3 Mallampati/Airway Score: Three   Imaging: Ct Angio Chest Pe W Or Wo  Contrast  Result Date: 05/14/2017 CLINICAL DATA:  Chest pain and shortness of breath for the past month. Evaluate for pulmonary embolism. EXAM: CT ANGIOGRAPHY CHEST WITH CONTRAST TECHNIQUE: Multidetector CT imaging of the chest was performed using the standard protocol during bolus administration of intravenous contrast. Multiplanar CT image reconstructions and MIPs were obtained to evaluate the vascular anatomy. CONTRAST:  ISOVUE-370 IOPAMIDOL (ISOVUE-370) INJECTION 76% COMPARISON:  05/13/2017; 04/07/2017; 01/27/2017; 01/10/2016 FINDINGS: Vascular Findings: There is adequate opacification of the pulmonary arterial system with the main pulmonary artery measuring 352 Hounsfield units. There are no discrete filling defects within the pulmonary arterial tree to suggest pulmonary embolism. Normal caliber the main pulmonary artery. Borderline cardiomegaly. No pericardial effusion. Coronary artery calcifications. Atherosclerotic plaque within a normal caliber thoracic aorta. No definite thoracic aortic annular, dissection or periaortic stranding on this nongated examination. Conventional configuration of the aortic arch. The branch vessels of the aortic arch appear patent throughout their imaged course. Review of the MIP images confirms  the above findings. ---------------------------------------------------------------------------------- Nonvascular Findings: Mediastinum/Lymph Nodes: No bulky mediastinal, hilar or axillary lymphadenopathy. Lungs/Pleura: Minimal dependent subpleural ground-glass atelectasis. No discrete focal airspace opacities. No pleural effusion or pneumothorax. Mild to moderate apical predominant mixed centrilobular and paraseptal emphysematous change. No discrete pulmonary nodules. Upper abdomen: Limited early arterial phase evaluation of the upper abdomen demonstrates a small hiatal hernia. Musculoskeletal: No acute or aggressive osseous abnormalities. 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 Stigmata of mild DISH throughout the thoracic spine. Mild diffuse body wall anasarca. Imaged portions of the thyroid gland are normal. IMPRESSION: 1. Mild-to-moderate apical predominant mixed centrilobular and paraseptal emphysematous change. Otherwise, no explanation for patient's shortness of breath. Specifically, no evidence of pulmonary embolism. 2. Borderline cardiomegaly. 3. Coronary artery calcifications. 4. Aortic Atherosclerosis (ICD10-I70.0) and Emphysema (ICD10-J43.9). Electronically Signed   By: Simonne Come M.D.   On: 05/14/2017 09:39   Dg Chest Portable 1 View  Result Date: 05/13/2017 CLINICAL DATA:  Pt here from dialysis with c/o central chest pain and dizziness. Pt did all but 30 mins of his dialysis. Denies SOB. Hx of HTN, diabetes, and CAD. Former smoker- quit 2012. EXAM: PORTABLE CHEST 1 VIEW COMPARISON:  04/07/2017 FINDINGS: Cardiac silhouette is normal in size. No mediastinal or hilar masses. No convincing adenopathy. Clear lungs.  No pleural effusion or pneumothorax. Skeletal structures are grossly intact. IMPRESSION: No active disease. Electronically Signed   By: Amie Portland M.D.   On: 05/13/2017 18:54   Ir Dialy  Shunt Intro Needle/intracath Initial W/img Left  Result Date: 05/16/2017 CLINICAL DATA:  Difficult cannulation of left upper arm dialysis graft with increased venous pressures during dialysis. EXAM: DIALYSIS AV SHUNTLOGRAM COMPARISON:  None CONTRAST:  60 mL Isovue-300 FLUOROSCOPY TIME:  36 seconds.  71.4 mGy. PROCEDURE: The procedure, risks, benefits, and alternatives were explained to the patient. Questions regarding the procedure were encouraged and answered. The patient understands and consents to the procedure. A time-out was performed prior to initiating the procedure. The left upper arm AV graft was prepped with chlorhexidine in a sterile fashion, and a sterile drape was applied covering the operative field. A diagnostic shuntogram was performed via an 18 gauge angiocatheter introduced into the graft. Venous drainage was assessed to the level of the central veins. Proximal graft was studied by reflux maneuver with temporary compression of venous outflow. After the procedure, the angiocatheter was removed and hemostasis obtained with manual compression. COMPLICATIONS: None FINDINGS:  The left upper arm straight graft is normally patent including the arterial anastomosis with the brachial artery. At the venous anastomosis there is minimal stenosis of roughly 20-25%. Venous outflow demonstrates significant collateral veins in the left neck with flow identified in the left internal jugular vein and additional collaterals crossing the neck to the right side. There is a focal likely high-grade stenosis at the juncture of the subclavian vein and brachiocephalic vein. In addition, a significant central left brachiocephalic venous stenosis is suspected at the brachiocephalic venous confluence. There is an indication to perform central venous angioplasty. The patient was not NPO today and would like to receive conscious sedation for intervention. He will be made NPO after midnight and venous angioplasty scheduled for  tomorrow. 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 Abundant collateral veins are identified in the neck and chest. Central venous angioplasty is indicated but will be deferred until tomorrow as the patient has requested IV conscious sedation be administered for the intervention. Electronically Signed   By: Irish Lack M.D.   On: 05/16/2017 16:23    Labs:  CBC: Recent Labs    05/14/17 0708 05/15/17 0529 05/16/17 0359 05/17/17 0540  WBC 7.3 5.4 5.7 8.6  HGB 9.8* 9.3* 9.1* 8.9*  HCT 30.4* 29.0* 28.2* 28.1*  PLT 283 296 301 310    COAGS: Recent Labs    01/14/17 1126 05/13/17 1845  INR 0.99 0.98    BMP: Recent Labs    04/07/17 0139 05/13/17 1850 05/14/17 0809 05/15/17 0529 05/16/17 0738  NA 138 138 137 137 138  K 3.8 2.8* 3.5 3.4* 3.6  CL 105 94* 97* 99* 100*  CO2 22  --  25 26 26   GLUCOSE 108* 95 120* 112* 117*  BUN 35* 16 25* 31* 36*  CALCIUM 8.5*  --  7.9* 8.0* 8.7*  CREATININE 4.67* 4.10* 5.36* 6.32* 6.90*  GFRNONAA 12*  --  10* 8* 8*  GFRAA 14*  --  12* 10* 9*    LIVER FUNCTION TESTS: Recent Labs    07/17/16 1153 12/23/16 0636 01/14/17 0343 04/07/17 0139 05/16/17 0738  BILITOT 0.3 0.5 0.4 0.7  --   AST 24 18 17 20   --   ALT 29 14* 14* 18  --   ALKPHOS 60 65 54 65  --   PROT 7.1 6.5 5.7* 6.7  --   ALBUMIN 3.5 3.4* 2.9* 3.1* 3.6    TUMOR MARKERS: No results for input(s): AFPTM, CEA, CA199, CHROMGRNA in the last 8760 hours.  Assessment and Plan: Left upper arm graft with slow flow Shuntogram performed yesterday and shows central venous stenoses at the juncture of the left subclavian and brachiocephalic veins.  Patient has been NPO today.  SQ heparin 0600 this AM.  Will plan to proceed with intervention.  Risks and benefits discussed with the patient including, but not limited to bleeding, infection,  vascular injury, pulmonary embolism, need for tunneled HD catheter placement or even death.  All of the patient's questions were answered, patient is agreeable to proceed. Consent signed and in chart.  Thank you for this interesting consult.  I greatly enjoyed meeting Richard Barnett and look forward to participating in their care.  A copy of this report was sent to the requesting provider on this date.  Electronically Signed: Hoyt Koch, PA 05/17/2017, 10:30 AM   I spent a  total of 40 Minutes    in face to face in clinical consultation, greater than 50% of which was counseling/coordinating care for malfunction of left arm graft.

## 2017-05-17 NOTE — Progress Notes (Signed)
PROGRESS NOTE    Richard Barnett   WUJ:[id number]  DOB: 1956/01/16  DOA: 05/13/2017 PCP: Patient, No Pcp Per   Brief Narrative:  Richard Barnett  a 62 y.o. [place] with medical history significant of ESRD-HD (MWF, justed started in 04/2017), hypertension, hyperlipidemia, diet-controlled diabetes, CAD, stent placement 01/2017, GERD, depression, DVT, who presents with chest pain. This was left sided chest pain which occurred while on dialysis, felt like a cramp ,  was severe (10/10) and non radiating. He tells me that he has been getting a similar pain when he takes deep breaths. No respiratory symptoms or fevers. He states that he hates himself and wants to kill himself. He has not thought out a plan.    Subjective: He has no complaints today.  Assessment & Plan:   Principal Problem:   Chest pain - worse when taking a deep breath- persistent while on dialysis but now intermittent again - d dimer was elevated: CTA chest negative, LE venous duplex negative - negative troponin - cardiology has evaluated him and feels that he does not need any further work up as he just had a Cardiac cath in 11/18 which did not reveal and concerning major stenosis. -cont ASA, Plavix and Statin - 3/10- chest pain has resolved    Active Problems:   Suicidal ideation - he states he wants to kill himself but has no plan - psych recommends inpatient psych admission- he has sitter at beside - Zoloft started by psych - social worker has found a bed for him today, however, due to problems with dialysis access, he is unable to be discharged today- see below     ESRD on dialysis   - he was being dialyzed today when it was noted he may have a stenosis of his fistula- dialysis was not completed -  Nephrology has consulted IR for a shuntogram- he was noted to have a stenosis which IR will correct today - nephrology would like him to have dialysis in hospital tomorrow to make sure the access is working properly   Hypokalemia - replaced      Hypertension - Norvasc, Imdur, Metoprolol, PRN Hydralazine    Hyperlipidemia - LDL 107- cont Lipitor    GERD (gastroesophageal reflux disease) - PPI  AOCD - follow - Hb 9.8 today   DVT prophylaxis: Heparin Code Status: Full code Family Communication:  Disposition Plan: home when stable Consultants:   cardiology Procedures:    Antimicrobials:  Anti-infectives (From admission, onward)   None     Objective: Vitals:   05/17/17 1440 05/17/17 1445 05/17/17 1450 05/17/17 1455  BP: 125/78 124/76 124/81 (!) 176/94  Pulse: 73 67 68 68  Resp: 11 12 20 14   Temp:      TempSrc:      SpO2: 99% 100% 100% 100%  Weight:      Height:        Intake/Output Summary (Last 24 hours) at 05/17/2017 1500 Last data filed at 05/17/2017 0950 Gross per 24 hour  Intake 420 ml  Output -  Net 420 ml   Filed Weights   05/16/17 0520 05/16/17 0810 05/17/17 0433  Weight: 101.3 kg (223 lb 6.4 oz) 101.4 kg (223 lb 8.7 oz) 99.8 kg (220 lb)    Examination: General exam: Appears comfortable  HEENT: PERRLA, oral mucosa moist, no sclera icterus or thrush Respiratory system: Clear to auscultation. Respiratory effort normal. Cardiovascular system: S1 & S2 heard,  No murmurs  Gastrointestinal system: Abdomen soft, non-tender, nondistended.  Normal bowel sound. No organomegaly Central nervous system: Alert and oriented. No focal neurological deficits. Extremities: No cyanosis, clubbing or edema Skin: No rashes or ulcers Psychiatry:  Mood & affect appropriate.     Data Reviewed: I have personally reviewed following labs and imaging studies  CBC: Recent Labs  Lab 05/13/17 1845 05/13/17 1850 05/14/17 0708 05/15/17 0529 05/16/17 0359 05/17/17 0540  WBC 6.6  --  7.3 5.4 5.7 8.6  HGB 10.3* 11.6* 9.8* 9.3* 9.1* 8.9*  HCT 31.8* 34.0* 30.4* 29.0* 28.2* 28.1*  MCV 88.3  --  89.7 89.8 89.8 89.5  PLT 303  --  283 296 301 310   Basic Metabolic Panel: Recent Labs    Lab 05/13/17 1845 05/13/17 1850 05/14/17 0809 05/15/17 0529 05/16/17 0738  NA  --  138 137 137 138  K  --  2.8* 3.5 3.4* 3.6  CL  --  94* 97* 99* 100*  CO2  --   --  25 26 26   GLUCOSE  --  95 120* 112* 117*  BUN  --  16 25* 31* 36*  CREATININE  --  4.10* 5.36* 6.32* 6.90*  CALCIUM  --   --  7.9* 8.0* 8.7*  MG 1.8  --   --   --   --   PHOS  --   --   --   --  4.4   GFR: Estimated Creatinine Clearance: 12.5 mL/min (A) (by C-G formula based on SCr of 6.9 mg/dL (H)). Liver Function Tests: Recent Labs  Lab 05/16/17 0738  ALBUMIN 3.6   No results for input(s): LIPASE, AMYLASE in the last 168 hours. No results for input(s): AMMONIA in the last 168 hours. Coagulation Profile: Recent Labs  Lab 05/13/17 1845  INR 0.98   Cardiac Enzymes: Recent Labs  Lab 05/13/17 2000 05/14/17 0216 05/14/17 0708  TROPONINI <0.03 <0.03 <0.03   BNP (last 3 results) No results for input(s): PROBNP in the last 8760 hours. HbA1C: No results for input(s): HGBA1C in the last 72 hours. CBG: Recent Labs  Lab 05/13/17 1930  GLUCAP 96   Lipid Profile: No results for input(s): CHOL, HDL, LDLCALC, TRIG, CHOLHDL, LDLDIRECT in the last 72 hours. Thyroid Function Tests: No results for input(s): TSH, T4TOTAL, FREET4, T3FREE, THYROIDAB in the last 72 hours. Anemia Panel: No results for input(s): VITAMINB12, FOLATE, FERRITIN, TIBC, IRON, RETICCTPCT in the last 72 hours. Urine analysis:    Component Value Date/Time   COLORURINE YELLOW 04/07/2017 0301   APPEARANCEUR CLEAR 04/07/2017 0301   LABSPEC 1.013 04/07/2017 0301   PHURINE 6.0 04/07/2017 0301   GLUCOSEU 150 (A) 04/07/2017 0301   HGBUR MODERATE (A) 04/07/2017 0301   BILIRUBINUR NEGATIVE 04/07/2017 0301   KETONESUR NEGATIVE 04/07/2017 0301   PROTEINUR >=300 (A) 04/07/2017 0301   UROBILINOGEN 0.2 02/21/2015 0955   NITRITE NEGATIVE 04/07/2017 0301   LEUKOCYTESUR NEGATIVE 04/07/2017 0301   Sepsis  Labs: @LABRCNTIP (procalcitonin:4,lacticidven:4) )No results found for this or any previous visit (from the past 240 hour(s)).       Radiology Studies: Ir Dialy Shunt Intro Needle/intracath Initial W/img Left  Result Date: 05/16/2017 CLINICAL DATA:  Difficult cannulation of left upper arm dialysis graft with increased venous pressures during dialysis. EXAM: DIALYSIS AV SHUNTLOGRAM COMPARISON:  None CONTRAST:  60 mL Isovue-300 FLUOROSCOPY TIME:  36 seconds.  71.4 mGy. PROCEDURE: The procedure, risks, benefits, and alternatives were explained to the patient. Questions regarding the procedure were encouraged and answered. The patient understands and consents to the procedure.  A time-out was performed prior to initiating the procedure. The left upper arm AV graft was prepped with chlorhexidine in a sterile fashion, and a sterile drape was applied covering the operative field. A diagnostic shuntogram was performed via an 18 gauge angiocatheter introduced into the graft. Venous drainage was assessed to the level of the central veins. Proximal graft was studied by reflux maneuver with temporary compression of venous outflow. After the procedure, the angiocatheter was removed and hemostasis obtained with manual compression. COMPLICATIONS: None FINDINGS: The left upper arm straight graft is normally patent including the arterial anastomosis with the brachial artery. At the venous anastomosis there is minimal stenosis of roughly 20-25%. Venous outflow demonstrates significant collateral veins in the left neck with flow identified in the left internal jugular vein and additional collaterals crossing the neck to the right side. There is a focal likely high-grade stenosis at the juncture of the subclavian vein and brachiocephalic vein. In addition, a significant central left brachiocephalic venous stenosis is suspected at the brachiocephalic venous confluence. There is an indication to perform central venous  angioplasty. The patient was not NPO today and would like to receive conscious sedation for intervention. He will be made NPO after midnight and venous angioplasty scheduled for tomorrow. 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 Abundant collateral veins are identified in the neck and chest. Central venous angioplasty is indicated but will be deferred until tomorrow as the patient has requested IV conscious sedation be administered for the intervention. Electronically Signed   By: Irish Lack M.D.   On: 05/16/2017 16:23      Scheduled Meds: . amLODipine  10 mg Oral Daily  . aspirin EC  81 mg Oral Daily  . atorvastatin  80 mg Oral q1800  . clopidogrel  75 mg Oral Daily  . darbepoetin (ARANESP) injection - DIALYSIS  60 mcg Intravenous Q Mon-HD  . fentaNYL      . heparin injection (subcutaneous)  5,000 Units Subcutaneous Q8H  . isosorbide mononitrate  30 mg Oral Daily  . latanoprost  1 drop Both Eyes QHS  . lidocaine      . metoprolol tartrate  50 mg Oral BID WC  . midazolam      . multivitamin  1 tablet Oral QHS  . pantoprazole  40 mg Oral Daily  . sertraline  50 mg Oral QHS   Continuous Infusions: . [START ON 05/18/2017] ferric gluconate (FERRLECIT/NULECIT) IV       LOS: 2 days    Time spent in minutes: 35    Calvert Cantor, MD [facility] Pager: [email or web address] Password [email or web address] 05/17/2017, 3:00 PM

## 2017-05-17 NOTE — BH Assessment (Signed)
This Clinical research associate received return call from [place], [place] SW [phone]), she stated doctor want to do another procedure on patient tomorrow. Writer informed Darl Pikes she would talk to charge nurse to see if bed can be held for tomorrow. Darl Pikes stated she would re-fax signed voluntary treatment form

## 2017-05-17 NOTE — BH Assessment (Signed)
This Clinical research associate spoke with Carney Bern, [facility] Disposition SW, in regards to patient transfer to [facility]. Writer was informed per RN, Inetta Fermo [phone]), patient is having a medical procedure this AM and should be transported some time this afternoon to [facility].   This Clinical research associate received call from [place], [place] SW [phone]), she confirmed patient will be transported this afternoon after his medical procedure this morning.

## 2017-05-17 NOTE — Clinical Social Work Note (Signed)
Clinical Social Work Assessment  Patient Details  Name: Richard Barnett MRN: [id number] Date of Birth: 1955-10-16  Date of referral:  05/17/17               Reason for consult:  Mental Health Concerns, Suicide Risk/Attempt, Facility Placement(psychiatric inpatient placement)                Permission sought to share information with:  Facility Sport and exercise psychologist Permission granted to share information::     Name::     Darden Palmer  Agency::  [facility]  Relationship::  friend/housekeeper  Contact Information:  [phone]  Housing/Transportation Living arrangements for the past 2 months:  Apartment Source of Information:  Patient Patient Interpreter Needed:  None Criminal Activity/Legal Involvement Pertinent to Current Situation/Hospitalization:  No - Comment as needed Significant Relationships:  Adult Children, Other Family Members Lives with:  Self Do you feel safe going back to the place where you live?  Yes Need for family participation in patient care:  No (Coment)  Care giving concerns: Patient is from home alone. Psychiatry recommending inpatient psych placement due to suicidal thoughts and inability to contract for safety.   Social Worker assessment / plan: CSW met with patient at bedside yesterday and discussed recommendation for psych placement. Patient was agreeable to voluntarily admit to [facility] and signed voluntary consent form.   CSW met with patient again today to complete full assessment. Patient alert and oriented, though with flat affect. Patient indicated he is still willing to voluntarily admit to [facility]. Patient reports he lives independently in an apartment. He stated he and his daughter have not spoken in a long time, "she doesn't answer her phone." Patient gave permission to speak to his friend/housekeeper, Darden Palmer and let her know when he is transferred to [facility].   CSW received call from [facility] disposition this morning to follow up on patient's  expected discharge. Patient was initially stable for discharge yesterday, but now issues with fistula will be addressed prior to discharge. CSW updated [facility] intake. CSW to support with discharge when medically ready.  Employment status:  Retired Forensic scientist:  Medicaid [place] PT Recommendations:  Not assessed at this time [place] / Referral to community resources:  [place] (Comment Required)([facility])  Patient/Family's Response to care: Not discussed.  Patient/Family's Understanding of and Emotional Response to Diagnosis, Current Treatment, and Prognosis: Did not discuss response to care; patient agreeable to admit to [facility].  Emotional Assessment Appearance:  Appears stated age Attitude/Demeanor/Rapport:  Engaged Affect (typically observed):  Calm, Flat Orientation:  Oriented to Self, Oriented to Place, Oriented to Situation Alcohol / Substance use:  Not Applicable Psych involvement (Current and /or in the community):  Yes (Comment)  Discharge Needs  Concerns to be addressed:  Discharge Planning Concerns, Mental Health Concerns, Other (Comment Required(inpatient psych placement) Readmission within the last 30 days:  No Current discharge risk:  Psychiatric Illness Barriers to Discharge:  Continued Medical Work up   Estanislado Emms, LCSW 05/17/2017, 10:30 AM

## 2017-05-17 NOTE — Progress Notes (Signed)
CSW spoke with Inetta Fermo, RN [phone]) regarding psych transfer to [facility]. Pt has a medical procedure scheduled for today and [place], disposition staff [phone]) at [facility] will contact RN for more information.  CSW spoke with Abigail Butts, LCSWA, medical floor CSW [phone]) to inform her that [facility] is aware of Pts medical procedure and will communicate directly with his floor nurse regarding transfer.   Wells Guiles, LCSW, LCAS Disposition CSW [phone]

## 2017-05-17 NOTE — Progress Notes (Signed)
[facility] Progress Note   Dialysis Orders:  [facility]  MWF 4hrs, 400/800, 2K/2Ca, EDW 101.5kg, LU AVG Calcitriol 0.48mcg qHD Venofer 100mg  IV qHD - 8/10 completed No Heparin  Assessment/Plan: 1. Chest pain -resolved - neg work up 2. ESRD - MWF - HD tomorrow - partial treatment yesterday;  problems with access- yesterday - for shuntagram today 3. Anemia - hgb 8.9 Hgb trending down from 11 started Aranesp 60 3/11- finish course- of Fe; on plavix - check FOBT 4. Secondary hyperparathyroidism - calcitriol/no binders yet 5. HTN/volume - EDW +/- 101 - BP ok 6. Nutrition - add vitamins 7. Suicidal ideation - for transfer to [facility] at [facility] when issues resolved here  Sheffield Slider, PA-C [facility] Beeper [phone] 05/17/2017,8:34 AM  LOS: 2 days   Subjective:   No c/o  Objective Vitals:   05/16/17 1410 05/16/17 1807 05/16/17 2010 05/17/17 0433  BP: (!) 141/69 138/77 116/71 (!) 145/85  Pulse: 83 87 77 77  Resp: 18  18   Temp: 98.4 F (36.9 C)  98.7 F (37.1 C) 97.7 F (36.5 C)  TempSrc: Oral  Oral Oral  SpO2: 100%  99% 100%  Weight:    99.8 kg (220 lb)  Height:       Physical Exam General: NAD reserved affect Heart: RRR Lungs: no rales Abdomen: soft NT Extremities: no LE edema Dialysis Access:  Left AVGG + bruit   Additional Objective Labs: Basic Metabolic Panel: Recent Labs  Lab 05/14/17 0809 05/15/17 0529 05/16/17 0738  NA 137 137 138  K 3.5 3.4* 3.6  CL 97* 99* 100*  CO2 25 26 26   GLUCOSE 120* 112* 117*  BUN 25* 31* 36*  CREATININE 5.36* 6.32* 6.90*  CALCIUM 7.9* 8.0* 8.7*  PHOS  --   --  4.4   Liver Function Tests: Recent Labs  Lab 05/16/17 0738  ALBUMIN 3.6   No results for input(s): LIPASE, AMYLASE in the last 168 hours. CBC: Recent Labs  Lab 05/13/17 1845  05/14/17 0708 05/15/17 0529 05/16/17 0359 05/17/17 0540  WBC 6.6  --  7.3 5.4 5.7 8.6  HGB 10.3*   < > 9.8* 9.3* 9.1* 8.9*  HCT 31.8*   < > 30.4*  29.0* 28.2* 28.1*  MCV 88.3  --  89.7 89.8 89.8 89.5  PLT 303  --  283 296 301 310   < > = values in this interval not displayed.   Blood Culture No results found for: SDES, SPECREQUEST, CULT, REPTSTATUS  Cardiac Enzymes: Recent Labs  Lab 05/13/17 2000 05/14/17 0216 05/14/17 0708  TROPONINI <0.03 <0.03 <0.03   CBG: Recent Labs  Lab 05/13/17 1930  GLUCAP 96   Iron Studies: No results for input(s): IRON, TIBC, TRANSFERRIN, FERRITIN in the last 72 hours. Lab Results  Component Value Date   INR 0.98 05/13/2017   INR 0.99 01/14/2017   INR 1.01 08/22/2014   Studies/Results: Ir Dialy Shunt Intro Needle/intracath Initial W/img Left  Result Date: 05/16/2017 CLINICAL DATA:  Difficult cannulation of left upper arm dialysis graft with increased venous pressures during dialysis. EXAM: DIALYSIS AV SHUNTLOGRAM COMPARISON:  None CONTRAST:  60 mL Isovue-300 FLUOROSCOPY TIME:  36 seconds.  71.4 mGy. PROCEDURE: The procedure, risks, benefits, and alternatives were explained to the patient. Questions regarding the procedure were encouraged and answered. The patient understands and consents to the procedure. A time-out was performed prior to initiating the procedure. The left upper arm AV graft was prepped with chlorhexidine in  a sterile fashion, and a sterile drape was applied covering the operative field. A diagnostic shuntogram was performed via an 18 gauge angiocatheter introduced into the graft. Venous drainage was assessed to the level of the central veins. Proximal graft was studied by reflux maneuver with temporary compression of venous outflow. After the procedure, the angiocatheter was removed and hemostasis obtained with manual compression. COMPLICATIONS: None FINDINGS: The left upper arm straight graft is normally patent including the arterial anastomosis with the brachial artery. At the venous anastomosis there is minimal stenosis of roughly 20-25%. Venous outflow demonstrates significant  collateral veins in the left neck with flow identified in the left internal jugular vein and additional collaterals crossing the neck to the right side. There is a focal likely high-grade stenosis at the juncture of the subclavian vein and brachiocephalic vein. In addition, a significant central left brachiocephalic venous stenosis is suspected at the brachiocephalic venous confluence. There is an indication to perform central venous angioplasty. The patient was not NPO today and would like to receive conscious sedation for intervention. He will be made NPO after midnight and venous angioplasty scheduled for tomorrow. 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 Abundant collateral veins are identified in the neck and chest. Central venous angioplasty is indicated but will be deferred until tomorrow as the patient has requested IV conscious sedation be administered for the intervention. Electronically Signed   By: Irish Lack M.D.   On: 05/16/2017 16:23   Medications:  . amLODipine  10 mg Oral Daily  . aspirin EC  81 mg Oral Daily  . atorvastatin  80 mg Oral q1800  . clopidogrel  75 mg Oral Daily  . darbepoetin (ARANESP) injection - DIALYSIS  60 mcg Intravenous Q Mon-HD  . heparin injection (subcutaneous)  5,000 Units Subcutaneous Q8H  . isosorbide mononitrate  30 mg Oral Daily  . latanoprost  1 drop Both Eyes QHS  . metoprolol tartrate  50 mg Oral BID WC  . pantoprazole  40 mg Oral Daily  . sertraline  50 mg Oral QHS

## 2017-05-17 NOTE — Progress Notes (Signed)
Patient is calm and followed commands.  He took all medications w/o incident.  I will keep monitoring patient.

## 2017-05-17 NOTE — Progress Notes (Signed)
CSW confirmed patient's bed at [facility] this morning. However, due to complications with HD fistula, patient unable to discharge today. [facility] unable to hold patient's bed today, but will accept new referral tomorrow. CSW to follow up with [facility] in the morning 05/18/17 and will support with discharge.  Abigail Butts, LCSWA [phone]

## 2017-05-18 LAB — RENAL FUNCTION PANEL
Albumin: 3.2 g/dL — ABNORMAL LOW (ref 3.5–5.0)
Anion gap: 10 (ref 5–15)
BUN: 35 mg/dL — ABNORMAL HIGH (ref 6–20)
CO2: 24 mmol/L (ref 22–32)
Calcium: 8.3 mg/dL — ABNORMAL LOW (ref 8.9–10.3)
Chloride: 103 mmol/L (ref 101–111)
Creatinine, Ser: 6.6 mg/dL — ABNORMAL HIGH (ref 0.61–1.24)
GFR calc Af Amer: 9 mL/min — ABNORMAL LOW (ref >60)
GFR calc non Af Amer: 8 mL/min — ABNORMAL LOW (ref >60)
Glucose, Bld: 129 mg/dL — ABNORMAL HIGH (ref 65–99)
Phosphorus: 3.5 mg/dL (ref 2.5–4.6)
Potassium: 4 mmol/L (ref 3.5–5.1)
Sodium: 137 mmol/L (ref 135–145)

## 2017-05-18 LAB — CBC
HCT: 29.5 % — ABNORMAL LOW (ref 39.0–52.0)
Hemoglobin: 9.5 g/dL — ABNORMAL LOW (ref 13.0–17.0)
MCH: 29 pg (ref 26.0–34.0)
MCHC: 32.2 g/dL (ref 30.0–36.0)
MCV: 89.9 fL (ref 78.0–100.0)
PLATELETS: 330 10*3/uL (ref 150–400)
RBC: 3.28 MIL/uL — AB (ref 4.22–5.81)
RDW: 14.7 % (ref 11.5–15.5)
WBC: 9.6 10*3/uL (ref 4.0–10.5)

## 2017-05-18 MED ORDER — LOPERAMIDE HCL 1 MG/5ML PO LIQD
1.0000 mg | ORAL | Status: DC | PRN
  Administered 2017-05-18: 1 mg via ORAL
  Filled 2017-05-18 (×2): qty 5

## 2017-05-18 MED ORDER — DARBEPOETIN ALFA 60 MCG/0.3ML IJ SOSY
PREFILLED_SYRINGE | INTRAMUSCULAR | Status: AC
  Filled 2017-05-18: qty 0.3

## 2017-05-18 MED ORDER — HEPARIN SODIUM (PORCINE) 1000 UNIT/ML DIALYSIS: 1000.0000 [IU] | INTRAMUSCULAR | Status: DC | PRN

## 2017-05-18 MED ORDER — PENTAFLUOROPROP-TETRAFLUOROETH EX AERO: 1.0000 "application " | INHALATION_SPRAY | CUTANEOUS | Status: DC | PRN

## 2017-05-18 MED ORDER — AMLODIPINE BESYLATE 10 MG PO TABS
10.0000 mg | ORAL_TABLET | Freq: Every day | ORAL | Status: DC
  Administered 2017-05-19 – 2017-05-20 (×2): 10 mg via ORAL
  Filled 2017-05-18 (×2): qty 1

## 2017-05-18 MED ORDER — ONDANSETRON 4 MG PO TBDP
4.0000 mg | ORAL_TABLET | Freq: Three times a day (TID) | ORAL | Status: DC | PRN
Start: 2017-05-18 — End: 2017-05-21
  Filled 2017-05-18 (×2): qty 1

## 2017-05-18 MED ORDER — SODIUM CHLORIDE 0.9 % IV SOLN: 100.0000 mL | INTRAVENOUS | Status: DC | PRN

## 2017-05-18 MED ORDER — LIDOCAINE-PRILOCAINE 2.5-2.5 % EX CREA: 1.0000 "application " | TOPICAL_CREAM | CUTANEOUS | Status: DC | PRN

## 2017-05-18 MED ORDER — ALTEPLASE 2 MG IJ SOLR: 2.0000 mg | Freq: Once | INTRAMUSCULAR | Status: DC | PRN

## 2017-05-18 MED ORDER — LIDOCAINE HCL (PF) 1 % IJ SOLN: 5.0000 mL | INTRAMUSCULAR | Status: DC | PRN

## 2017-05-18 NOTE — Progress Notes (Signed)
CSW called in new referral for patient to [facility]. They no longer have a bed available for patient. At this time, [facility] is the only inpatient psychiatric facility that can take a patient on dialysis. CSW to continue to follow up with [facility] for bed availability.  Abigail Butts, LCSWA [phone]

## 2017-05-18 NOTE — Care Management Note (Signed)
Case Management Note  Patient Details  Name: Richard Barnett MRN: [id number] Date of Birth: 1955-11-29  Subjective/Objective:  Pt presented for chest pain and suicidal ideations. Hx of ESRD- MWF Schedule. Psych consulted and recommended inpatient psych. Awaiting bed at [facility].                   Action/Plan: CSW following along with CM for additional needs.   Expected Discharge Date:                  Expected Discharge Plan:  Psychiatric Hospital  In-House Referral:  Clinical Social Work  Discharge planning Services  CM Consult  Post Acute Care Choice:  NA Choice offered to:  NA  DME Arranged:  N/A DME Agency:  NA  HH Arranged:  NA HH Agency:  NA  Status of Service:  Completed, signed off  If discussed at Long Length of Stay Meetings, dates discussed:    Additional Comments:  Gala Lewandowsky, RN 05/18/2017, 3:39 PM

## 2017-05-18 NOTE — Progress Notes (Signed)
Asked to see patient post HD today due to "left arm swelling". Access arm is unchanged. No sig edema + bruit.  However, patient feels weak, sweaty after going to the bathroom.  "Thought I was going to pass out". Anxious  Tele 77 reg.  Supine BP 132/75  Standing BP 82/62 with HR in the 90s.  He actually was kept even on HD today with lowest BP in the 130s at the end of HD. Post HD wt 99.3 below outpt EDW.  Discussed with RN - given juice and saltines - repeat orthstatics in an hour - if still low standing BP will give bolus of saline.  Just received amlodipine at 1251.  Plan change in the future for HS dosing. AM dose MTP held.    Audree Bane, PA-C [facility]

## 2017-05-18 NOTE — Progress Notes (Signed)
PROGRESS NOTE    Richard Barnett   XBL:[id number]  DOB: 17-Jan-1956  DOA: 05/13/2017 PCP: Patient, No Pcp Per   Brief Narrative:  Richard Barnett  a 62 y.o. Richard Barnett with medical history significant of ESRD-HD (MWF, justed started in 04/2017), hypertension, hyperlipidemia, diet-controlled diabetes, CAD, stent placement 01/2017, GERD, depression, DVT, who presents with chest pain. This was left sided chest pain which occurred while on dialysis, felt like a cramp ,  was severe (10/10) and non radiating. He tells me that he has been getting a similar pain when he takes deep breaths. No respiratory symptoms or fevers.   Reportedly he stated that he hates himself and wants to kill himself. He has not thought out a plan per prior note.     Subjective: Pt reports no new complaints. Does not report things mentioned above to me today.  Assessment & Plan:   Principal Problem:   Chest pain - worse when taking a deep breath- persistent while on dialysis but now intermittent again - d dimer was elevated: CTA chest negative, LE venous duplex negative - negative troponin - cardiology has evaluated him and feels that he does not need any further work up as he just had a Cardiac cath in 11/18 which did not reveal and concerning major stenosis. -cont ASA, Plavix and Statin - 3/10- chest pain has resolved    Active Problems:   Suicidal ideation - he states he wants to kill himself but has no plan - psych recommends inpatient psych admission- he has sitter at beside - Zoloft started by psych - social worker has found a bed for him today, however, due to problems with dialysis access, he is unable to be discharged today- see below     ESRD on dialysis   - he was being dialyzed today when it was noted he may have a stenosis of his fistula- dialysis was not completed -  Nephrology has consulted IR for a shuntogram- he was noted to have a stenosis which IR will correct today - nephrology dialyzed patient today  to test access.     Hypokalemia - replaced      Hypertension - Norvasc, Imdur, Metoprolol, PRN Hydralazine    Hyperlipidemia - LDL 107- cont Lipitor    GERD (gastroesophageal reflux disease) - stable PPI  AOCD - follow - Hb 9.8 today   DVT prophylaxis: Heparin Code Status: Full code Family Communication:  Disposition Plan: home when stable Consultants:   cardiology Procedures:    Antimicrobials:  Anti-infectives (From admission, onward)   None     Objective: Vitals:   05/18/17 1236 05/18/17 1344 05/18/17 1345 05/18/17 1548  BP: (!) 146/74 132/75 (!) 82/62 131/82  Pulse: 82     Resp: 18     Temp: (!) 97.4 F (36.3 C)     TempSrc: Oral     SpO2: 100%     Weight:      Height:        Intake/Output Summary (Last 24 hours) at 05/18/2017 1658 Last data filed at 05/18/2017 1300 Gross per 24 hour  Intake 1200 ml  Output 0 ml  Net 1200 ml   Filed Weights   05/18/17 0530 05/18/17 0735 05/18/17 1153  Weight: 99.5 kg (219 lb 5.7 oz) 99.3 kg (218 lb 14.7 oz) 99.3 kg (218 lb 14.7 oz)    Examination: General exam: Appears comfortable, in nad. HEENT: PERRLA, oral mucosa moist, no sclera icterus or thrush Respiratory system: Clear to auscultation. Respiratory  effort normal. Cardiovascular system: S1 & S2 heard,  No murmurs  Gastrointestinal system: Abdomen soft, non-tender, nondistended. Normal bowel sound. No organomegaly Central nervous system: Alert and oriented. No focal neurological deficits. Extremities: No cyanosis, clubbing or edema Skin: No rashes or ulcers Psychiatry:  Mood & affect appropriate.   Data Reviewed: I have personally reviewed following labs and imaging studies  CBC: Recent Labs  Lab 05/14/17 0708 05/15/17 0529 05/16/17 0359 05/17/17 0540 05/18/17 0416  WBC 7.3 5.4 5.7 8.6 9.6  HGB 9.8* 9.3* 9.1* 8.9* 9.5*  HCT 30.4* 29.0* 28.2* 28.1* 29.5*  MCV 89.7 89.8 89.8 89.5 89.9  PLT 283 296 301 310 330   Basic Metabolic Panel: Recent  Labs  Lab 05/13/17 1845 05/13/17 1850 05/14/17 0809 05/15/17 0529 05/16/17 0738 05/18/17 0703  NA  --  138 137 137 138 137  K  --  2.8* 3.5 3.4* 3.6 4.0  CL  --  94* 97* 99* 100* 103  CO2  --   --  25 26 26 24   GLUCOSE  --  95 120* 112* 117* 129*  BUN  --  16 25* 31* 36* 35*  CREATININE  --  4.10* 5.36* 6.32* 6.90* 6.60*  CALCIUM  --   --  7.9* 8.0* 8.7* 8.3*  MG 1.8  --   --   --   --   --   PHOS  --   --   --   --  4.4 3.5   GFR: Estimated Creatinine Clearance: 13 mL/min (A) (by C-G formula based on SCr of 6.6 mg/dL (H)). Liver Function Tests: Recent Labs  Lab 05/16/17 0738 05/18/17 0703  ALBUMIN 3.6 3.2*   No results for input(s): LIPASE, AMYLASE in the last 168 hours. No results for input(s): AMMONIA in the last 168 hours. Coagulation Profile: Recent Labs  Lab 05/13/17 1845  INR 0.98   Cardiac Enzymes: Recent Labs  Lab 05/13/17 2000 05/14/17 0216 05/14/17 0708  TROPONINI <0.03 <0.03 <0.03   BNP (last 3 results) No results for input(s): PROBNP in the last 8760 hours. HbA1C: No results for input(s): HGBA1C in the last 72 hours. CBG: Recent Labs  Lab 05/13/17 1930  GLUCAP 96   Lipid Profile: No results for input(s): CHOL, HDL, LDLCALC, TRIG, CHOLHDL, LDLDIRECT in the last 72 hours. Thyroid Function Tests: No results for input(s): TSH, T4TOTAL, FREET4, T3FREE, THYROIDAB in the last 72 hours. Anemia Panel: No results for input(s): VITAMINB12, FOLATE, FERRITIN, TIBC, IRON, RETICCTPCT in the last 72 hours. Urine analysis:    Component Value Date/Time   COLORURINE YELLOW 04/07/2017 0301   APPEARANCEUR CLEAR 04/07/2017 0301   LABSPEC 1.013 04/07/2017 0301   PHURINE 6.0 04/07/2017 0301   GLUCOSEU 150 (A) 04/07/2017 0301   HGBUR MODERATE (A) 04/07/2017 0301   BILIRUBINUR NEGATIVE 04/07/2017 0301   KETONESUR NEGATIVE 04/07/2017 0301   PROTEINUR >=300 (A) 04/07/2017 0301   UROBILINOGEN 0.2 02/21/2015 0955   NITRITE NEGATIVE 04/07/2017 0301    LEUKOCYTESUR NEGATIVE 04/07/2017 0301   Sepsis Labs: @LABRCNTIP (procalcitonin:4,lacticidven:4) )No results found for this or any previous visit (from the past 240 hour(s)).       Radiology Studies: Ir US Guide Vasc Access Left  Result Date: 05/17/2017 INDICATION: 62 year old Richard Barnett with a history of high venous pressures of left upper extremity dialysis circuit EXAM: IR ULTRASOUND GUIDANCE VASC ACCESS LEFT; IR AV DIALY SHUNT INTRO NEEDLE/INTRACATH INITIAL W/PTA/IMG*L*; IR PTA AND STENT ADDL CENTRAL DIALY SEG THRU DIALY CIRCUIT LEFT MEDICATIONS: None. ANESTHESIA/SEDATION: Moderate Sedation  Time:  35 minutes The patient was continuously monitored during the procedure by the interventional radiology nurse under my direct supervision. FLUOROSCOPY TIME:  Fluoroscopy Time: 6 minutes 30 seconds (61 mGy). COMPLICATIONS: None PROCEDURE: Informed written consent was obtained from the patient after a thorough discussion of the procedural risks, benefits and alternatives. All questions were addressed. Maximal Sterile Barrier Technique was utilized including caps, mask, sterile gowns, sterile gloves, sterile drape, hand hygiene and skin antiseptic. A timeout was performed prior to the initiation of the procedure. Patient is positioned supine position with the left arm abducted. Left arm was prepped and draped in the usual sterile fashion. 1% lidocaine was used for local anesthesia. Ultrasound guidance was used for micropuncture access of the left upper extremity. Images were acquired. My Seldinger technique was used to place a 7 [place] sheath. Treatment of venous outflow stenosis was performed of the narrowing just beyond the graft anastomosis. 10 mm and 12 mm balloon angioplasty was performed. The 12 mm balloon was then used in an attempt to determine whether there was stenosis at the subclavian vein and at the innominate vein inflow into the superior vena cava. No narrowing was identified, with free movement of the  12 mm balloon 2 infero within the vein at each of these locations. Drug-eluting balloon was then used to treat the outflow stenosis beyond the graft material. Final venogram was performed. Z stitch was placed upon removal of the sheath. Sterile bandage was placed. Patient tolerated the procedure well and remained hemodynamically stable throughout. No complications were encountered and no significant blood loss. 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 The study demonstrates no significant stenosis at the subclavian vein or the innominate vein smaller than a 12 mm diameter. Signed, Yvone Neu. Loreta Ave, DO [facility] Radiology ACCESS: This access remains amenable to future percutaneous interventions as clinically indicated. Electronically Signed   By: Gilmer Mor D.O.   On: 05/17/2017 15:27   Ir Av Dialy Shunt Intro Needle/intracath Initial W/pta/img Left  Result Date: 05/17/2017 INDICATION: 61 year old Jaedon with a history of high venous pressures of left upper extremity dialysis circuit EXAM: IR ULTRASOUND GUIDANCE VASC ACCESS LEFT; IR AV DIALY SHUNT INTRO NEEDLE/INTRACATH INITIAL W/PTA/IMG*L*; IR PTA AND STENT ADDL CENTRAL DIALY SEG THRU DIALY CIRCUIT LEFT MEDICATIONS: None. ANESTHESIA/SEDATION: Moderate Sedation Time:  35 minutes The patient was continuously monitored during the procedure by the interventional radiology nurse under my direct supervision. FLUOROSCOPY TIME:  Fluoroscopy Time: 6 minutes 30 seconds (61 mGy). COMPLICATIONS: None PROCEDURE: Informed written consent was obtained from the patient after a thorough discussion of the procedural risks, benefits and alternatives. All questions were addressed. Maximal Sterile Barrier Technique was utilized including caps, mask, sterile gowns, sterile gloves, sterile drape, hand hygiene and skin  antiseptic. A timeout was performed prior to the initiation of the procedure. Patient is positioned supine position with the left arm abducted. Left arm was prepped and draped in the usual sterile fashion. 1% lidocaine was used for local anesthesia. Ultrasound guidance was used for micropuncture access of the left upper extremity. Images were acquired. My Seldinger technique was used to place a 7 [place] sheath. Treatment of venous outflow stenosis was performed of the narrowing just beyond the graft anastomosis. 10 mm and 12 mm balloon angioplasty was performed. The 12 mm balloon was then used in an attempt to determine whether there was stenosis  at the subclavian vein and at the innominate vein inflow into the superior vena cava. No narrowing was identified, with free movement of the 12 mm balloon 2 infero within the vein at each of these locations. Drug-eluting balloon was then used to treat the outflow stenosis beyond the graft material. Final venogram was performed. Z stitch was placed upon removal of the sheath. Sterile bandage was placed. Patient tolerated the procedure well and remained hemodynamically stable throughout. No complications were encountered and no significant blood loss. 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 The study demonstrates no significant stenosis at the subclavian vein or the innominate vein smaller than a 12 mm diameter. Signed, Yvone Neu. Loreta Ave, DO [facility] Radiology ACCESS: This access remains amenable to future percutaneous interventions as clinically indicated. Electronically Signed   By: Gilmer Mor D.O.   On: 05/17/2017 15:27   Ir Pts And Stent Addl Central Dialy Seg Thru Dialy Circuit Left  Result Date: 05/17/2017 INDICATION: 62 year old Alegandro with a history of high venous pressures of left upper  extremity dialysis circuit EXAM: IR ULTRASOUND GUIDANCE VASC ACCESS LEFT; IR AV DIALY SHUNT INTRO NEEDLE/INTRACATH INITIAL W/PTA/IMG*L*; IR PTA AND STENT ADDL CENTRAL DIALY SEG THRU DIALY CIRCUIT LEFT MEDICATIONS: None. ANESTHESIA/SEDATION: Moderate Sedation Time:  35 minutes The patient was continuously monitored during the procedure by the interventional radiology nurse under my direct supervision. FLUOROSCOPY TIME:  Fluoroscopy Time: 6 minutes 30 seconds (61 mGy). COMPLICATIONS: None PROCEDURE: Informed written consent was obtained from the patient after a thorough discussion of the procedural risks, benefits and alternatives. All questions were addressed. Maximal Sterile Barrier Technique was utilized including caps, mask, sterile gowns, sterile gloves, sterile drape, hand hygiene and skin antiseptic. A timeout was performed prior to the initiation of the procedure. Patient is positioned supine position with the left arm abducted. Left arm was prepped and draped in the usual sterile fashion. 1% lidocaine was used for local anesthesia. Ultrasound guidance was used for micropuncture access of the left upper extremity. Images were acquired. My Seldinger technique was used to place a 7 [place] sheath. Treatment of venous outflow stenosis was performed of the narrowing just beyond the graft anastomosis. 10 mm and 12 mm balloon angioplasty was performed. The 12 mm balloon was then used in an attempt to determine whether there was stenosis at the subclavian vein and at the innominate vein inflow into the superior vena cava. No narrowing was identified, with free movement of the 12 mm balloon 2 infero within the vein at each of these locations. Drug-eluting balloon was then used to treat the outflow stenosis beyond the graft material. Final venogram was performed. Z stitch was placed upon removal of the sheath. Sterile bandage was placed. Patient tolerated the procedure well and remained hemodynamically stable  throughout. No complications were encountered and no significant blood loss. 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 The study demonstrates no significant stenosis at the subclavian vein or the innominate vein smaller than a 12 mm diameter. Signed, Yvone Neu. Loreta Ave, DO [facility] Radiology ACCESS: This access remains amenable to future percutaneous interventions as clinically indicated. Electronically Signed   By: Gilmer Mor D.O.   On: 05/17/2017 15:27      Scheduled Meds: . [START ON 05/19/2017]  amLODipine  10 mg Oral QHS  . aspirin EC  81 mg Oral Daily  . atorvastatin  80 mg Oral q1800  . clopidogrel  75 mg Oral Daily  . darbepoetin (ARANESP) injection - DIALYSIS  60 mcg Intravenous Q Mon-HD  . heparin injection (subcutaneous)  5,000 Units Subcutaneous Q8H  . isosorbide mononitrate  30 mg Oral Daily  . latanoprost  1 drop Both Eyes QHS  . metoprolol tartrate  50 mg Oral BID WC  . multivitamin  1 tablet Oral QHS  . pantoprazole  40 mg Oral Daily  . sertraline  50 mg Oral QHS   Continuous Infusions: . sodium chloride    . sodium chloride       LOS: 3 days    Time spent in minutes: 35  Penny Pia, MD [facility] Pager: [email or web address] Password [facility] 05/18/2017, 4:58 PM

## 2017-05-18 NOTE — Procedures (Signed)
I was present at this dialysis session. I have reviewed the session itself and made appropriate changes.   S/p angioplasty of outflow stenosis yesterday. No problems with cannulation today, AP stable.    BP stable, minimal UF goal.  Hb stable.    No further inpatient renal needs.    Filed Weights   05/16/17 0810 05/17/17 0433 05/18/17 0530  Weight: 101.4 kg (223 lb 8.7 oz) 99.8 kg (220 lb) 99.5 kg (219 lb 5.7 oz)    Recent Labs  Lab 05/16/17 0738  NA 138  K 3.6  CL 100*  CO2 26  GLUCOSE 117*  BUN 36*  CREATININE 6.90*  CALCIUM 8.7*  PHOS 4.4    Recent Labs  Lab 05/16/17 0359 05/17/17 0540 05/18/17 0416  WBC 5.7 8.6 9.6  HGB 9.1* 8.9* 9.5*  HCT 28.2* 28.1* 29.5*  MCV 89.8 89.5 89.9  PLT 301 310 330    Scheduled Meds: . amLODipine  10 mg Oral Daily  . aspirin EC  81 mg Oral Daily  . atorvastatin  80 mg Oral q1800  . clopidogrel  75 mg Oral Daily  . darbepoetin (ARANESP) injection - DIALYSIS  60 mcg Intravenous Q Mon-HD  . heparin injection (subcutaneous)  5,000 Units Subcutaneous Q8H  . isosorbide mononitrate  30 mg Oral Daily  . latanoprost  1 drop Both Eyes QHS  . metoprolol tartrate  50 mg Oral BID WC  . multivitamin  1 tablet Oral QHS  . pantoprazole  40 mg Oral Daily  . sertraline  50 mg Oral QHS   Continuous Infusions: . ferric gluconate (FERRLECIT/NULECIT) IV     PRN Meds:.acetaminophen, hydrALAZINE, nitroGLYCERIN, ondansetron (ZOFRAN) IV, oxyCODONE-acetaminophen, zolpidem   Sabra Heck  MD 05/18/2017, 8:34 AM

## 2017-05-18 NOTE — BH Assessment (Signed)
Late Note-----Writer received phone call from [facility] Social Worker [place]) stating patient is medicall cleared and in need of inpatient treatment. Writer informed her, [facility] currently do not have any available beds.   Writer updated Attending Physician (Dr. Jennet Maduro). and [facility] Jamesetta So) about the patient's status.

## 2017-05-19 ENCOUNTER — Encounter (HOSPITAL_COMMUNITY): Payer: Self-pay | Admitting: Radiology

## 2017-05-19 LAB — CBC
HCT: 29.1 % — ABNORMAL LOW (ref 39.0–52.0)
Hemoglobin: 9.4 g/dL — ABNORMAL LOW (ref 13.0–17.0)
MCH: 29.5 pg (ref 26.0–34.0)
MCHC: 32.3 g/dL (ref 30.0–36.0)
MCV: 91.2 fL (ref 78.0–100.0)
PLATELETS: 304 10*3/uL (ref 150–400)
RBC: 3.19 MIL/uL — ABNORMAL LOW (ref 4.22–5.81)
RDW: 14.9 % (ref 11.5–15.5)
WBC: 10.8 10*3/uL — AB (ref 4.0–10.5)

## 2017-05-19 NOTE — Progress Notes (Signed)
PROGRESS NOTE    Richard Barnett   GYF:[id number]  DOB: September 12, 1955  DOA: 05/13/2017 PCP: Patient, No Pcp Per   Brief Narrative:  Richard Barnett  a 62 y.o. Richard Barnett with medical history significant of ESRD-HD (MWF, justed started in 04/2017), hypertension, hyperlipidemia, diet-controlled diabetes, CAD, stent placement 01/2017, GERD, depression, DVT, who presents with chest pain. This was left sided chest pain which occurred while on dialysis, felt like a cramp ,  was severe (10/10) and non radiating. He tells me that he has been getting a similar pain when he takes deep breaths. No respiratory symptoms or fevers.   Reportedly he stated that he hates himself and wants to kill himself. He has not thought out a plan per prior note.     Subjective: Pt reports feeling better. He was feeling fatigued after dialysis yesterday. Has resolved.   Assessment & Plan:   Principal Problem:   Chest pain - worse when taking a deep breath- persistent while on dialysis but now intermittent again - d dimer was elevated: CTA chest negative, LE venous duplex negative - negative troponin - cardiology has evaluated him and feels that he does not need any further work up as he just had a Cardiac cath in 11/18 which did not reveal and concerning major stenosis. -cont ASA, Plavix and Statin - 3/10- chest pain has resolved    Active Problems:   Suicidal ideation - he states he wants to kill himself but has no plan - psych recommends inpatient psych admission- he has sitter at beside - Zoloft started by psych - social worker aware. Currently awaiting bed.     ESRD on dialysis   - he was being dialyzed today when it was noted he may have a stenosis of his fistula- dialysis was not completed -  Nephrology has consulted IR for a shuntogram- he was noted to have a stenosis which IR will correct today - nephrology dialyzed patient today to test access.  Patient had hypotension and some adjustments were made. I will keep  patient until he is dialyzed again with no symptoms after dialysis session.    Hypokalemia - replaced      Hypertension - Norvasc, Imdur, Metoprolol, PRN Hydralazine    Hyperlipidemia - LDL 107- cont Lipitor    GERD (gastroesophageal reflux disease) - stable PPI  AOCD - follow - Hb 9.8 today   DVT prophylaxis: Heparin Code Status: Full code Family Communication:  Disposition Plan: To inpatient psychiatric facility once bed available Consultants:   cardiology Procedures:    Antimicrobials:  Anti-infectives (From admission, onward)   None     Objective: Vitals:   05/18/17 2221 05/18/17 2230 05/19/17 0442 05/19/17 0830  BP:   140/81 (!) 152/72  Pulse: 99  79 84  Resp:      Temp:   98.3 F (36.8 C)   TempSrc:   Oral   SpO2:  100% 100%   Weight:   98.7 kg (217 lb 11.2 oz)   Height:        Intake/Output Summary (Last 24 hours) at 05/19/2017 1434 Last data filed at 05/19/2017 0815 Gross per 24 hour  Intake 780 ml  Output -  Net 780 ml   Filed Weights   05/18/17 0735 05/18/17 1153 05/19/17 0442  Weight: 99.3 kg (218 lb 14.7 oz) 99.3 kg (218 lb 14.7 oz) 98.7 kg (217 lb 11.2 oz)    Examination: Exam unchanged when compared to prior on 05/18/2017 General exam: Appears comfortable,  in nad. HEENT: PERRLA, oral mucosa moist, no sclera icterus or thrush Respiratory system: Clear to auscultation. Respiratory effort normal. Cardiovascular system: S1 & S2 heard,  No murmurs  Gastrointestinal system: Abdomen soft, non-tender, nondistended. Normal bowel sound. No organomegaly Central nervous system: Alert and oriented. No focal neurological deficits. Extremities: No cyanosis, clubbing or edema Skin: No rashes or ulcers Psychiatry:  Mood & affect appropriate.   Data Reviewed: I have personally reviewed following labs and imaging studies  CBC: Recent Labs  Lab 05/15/17 0529 05/16/17 0359 05/17/17 0540 05/18/17 0416 05/19/17 0618  WBC 5.4 5.7 8.6 9.6 10.8*  HGB  9.3* 9.1* 8.9* 9.5* 9.4*  HCT 29.0* 28.2* 28.1* 29.5* 29.1*  MCV 89.8 89.8 89.5 89.9 91.2  PLT 296 301 310 330 304   Basic Metabolic Panel: Recent Labs  Lab 05/13/17 1845 05/13/17 1850 05/14/17 0809 05/15/17 0529 05/16/17 0738 05/18/17 0703  NA  --  138 137 137 138 137  K  --  2.8* 3.5 3.4* 3.6 4.0  CL  --  94* 97* 99* 100* 103  CO2  --   --  25 26 26 24   GLUCOSE  --  95 120* 112* 117* 129*  BUN  --  16 25* 31* 36* 35*  CREATININE  --  4.10* 5.36* 6.32* 6.90* 6.60*  CALCIUM  --   --  7.9* 8.0* 8.7* 8.3*  MG 1.8  --   --   --   --   --   PHOS  --   --   --   --  4.4 3.5   GFR: Estimated Creatinine Clearance: 13 mL/min (A) (by C-G formula based on SCr of 6.6 mg/dL (H)). Liver Function Tests: Recent Labs  Lab 05/16/17 0738 05/18/17 0703  ALBUMIN 3.6 3.2*   No results for input(s): LIPASE, AMYLASE in the last 168 hours. No results for input(s): AMMONIA in the last 168 hours. Coagulation Profile: Recent Labs  Lab 05/13/17 1845  INR 0.98   Cardiac Enzymes: Recent Labs  Lab 05/13/17 2000 05/14/17 0216 05/14/17 0708  TROPONINI <0.03 <0.03 <0.03   BNP (last 3 results) No results for input(s): PROBNP in the last 8760 hours. HbA1C: No results for input(s): HGBA1C in the last 72 hours. CBG: Recent Labs  Lab 05/13/17 1930  GLUCAP 96   Lipid Profile: No results for input(s): CHOL, HDL, LDLCALC, TRIG, CHOLHDL, LDLDIRECT in the last 72 hours. Thyroid Function Tests: No results for input(s): TSH, T4TOTAL, FREET4, T3FREE, THYROIDAB in the last 72 hours. Anemia Panel: No results for input(s): VITAMINB12, FOLATE, FERRITIN, TIBC, IRON, RETICCTPCT in the last 72 hours. Urine analysis:    Component Value Date/Time   COLORURINE YELLOW 04/07/2017 0301   APPEARANCEUR CLEAR 04/07/2017 0301   LABSPEC 1.013 04/07/2017 0301   PHURINE 6.0 04/07/2017 0301   GLUCOSEU 150 (A) 04/07/2017 0301   HGBUR MODERATE (A) 04/07/2017 0301   BILIRUBINUR NEGATIVE 04/07/2017 0301    KETONESUR NEGATIVE 04/07/2017 0301   PROTEINUR >=300 (A) 04/07/2017 0301   UROBILINOGEN 0.2 02/21/2015 0955   NITRITE NEGATIVE 04/07/2017 0301   LEUKOCYTESUR NEGATIVE 04/07/2017 0301   Sepsis Labs: @LABRCNTIP (procalcitonin:4,lacticidven:4) )No results found for this or any previous visit (from the past 240 hour(s)).       Radiology Studies: Ir US Guide Vasc Access Left  Result Date: 05/17/2017 INDICATION: 62 year old Kia with a history of high venous pressures of left upper extremity dialysis circuit EXAM: IR ULTRASOUND GUIDANCE VASC ACCESS LEFT; IR AV DIALY SHUNT INTRO NEEDLE/INTRACATH INITIAL  W/PTA/IMG*L*; IR PTA AND STENT ADDL CENTRAL DIALY SEG THRU DIALY CIRCUIT LEFT MEDICATIONS: None. ANESTHESIA/SEDATION: Moderate Sedation Time:  35 minutes The patient was continuously monitored during the procedure by the interventional radiology nurse under my direct supervision. FLUOROSCOPY TIME:  Fluoroscopy Time: 6 minutes 30 seconds (61 mGy). COMPLICATIONS: None PROCEDURE: Informed written consent was obtained from the patient after a thorough discussion of the procedural risks, benefits and alternatives. All questions were addressed. Maximal Sterile Barrier Technique was utilized including caps, mask, sterile gowns, sterile gloves, sterile drape, hand hygiene and skin antiseptic. A timeout was performed prior to the initiation of the procedure. Patient is positioned supine position with the left arm abducted. Left arm was prepped and draped in the usual sterile fashion. 1% lidocaine was used for local anesthesia. Ultrasound guidance was used for micropuncture access of the left upper extremity. Images were acquired. My Seldinger technique was used to place a 7 [place] sheath. Treatment of venous outflow stenosis was performed of the narrowing just beyond the graft anastomosis. 10 mm and 12 mm balloon angioplasty was performed. The 12 mm balloon was then used in an attempt to determine whether there was  stenosis at the subclavian vein and at the innominate vein inflow into the superior vena cava. No narrowing was identified, with free movement of the 12 mm balloon 2 infero within the vein at each of these locations. Drug-eluting balloon was then used to treat the outflow stenosis beyond the graft material. Final venogram was performed. Z stitch was placed upon removal of the sheath. Sterile bandage was placed. Patient tolerated the procedure well and remained hemodynamically stable throughout. No complications were encountered and no significant blood loss. 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 The study demonstrates no significant stenosis at the subclavian vein or the innominate vein smaller than a 12 mm diameter. Signed, Yvone Neu. Loreta Ave, DO [facility] Radiology ACCESS: This access remains amenable to future percutaneous interventions as clinically indicated. Electronically Signed   By: Richard Barnett D.O.   On: 05/17/2017 15:27   Ir Av Dialy Shunt Intro Needle/intracath Initial W/pta/img Left  Result Date: 05/17/2017 INDICATION: 62 year old Parks with a history of high venous pressures of left upper extremity dialysis circuit EXAM: IR ULTRASOUND GUIDANCE VASC ACCESS LEFT; IR AV DIALY SHUNT INTRO NEEDLE/INTRACATH INITIAL W/PTA/IMG*L*; IR PTA AND STENT ADDL CENTRAL DIALY SEG THRU DIALY CIRCUIT LEFT MEDICATIONS: None. ANESTHESIA/SEDATION: Moderate Sedation Time:  35 minutes The patient was continuously monitored during the procedure by the interventional radiology nurse under my direct supervision. FLUOROSCOPY TIME:  Fluoroscopy Time: 6 minutes 30 seconds (61 mGy). COMPLICATIONS: None PROCEDURE: Informed written consent was obtained from the patient after a thorough discussion of the procedural risks, benefits and alternatives. All questions  were addressed. Maximal Sterile Barrier Technique was utilized including caps, mask, sterile gowns, sterile gloves, sterile drape, hand hygiene and skin antiseptic. A timeout was performed prior to the initiation of the procedure. Patient is positioned supine position with the left arm abducted. Left arm was prepped and draped in the usual sterile fashion. 1% lidocaine was used for local anesthesia. Ultrasound guidance was used for micropuncture access of the left upper extremity. Images were acquired. My Seldinger technique was used to place a 7 [place] sheath. Treatment of venous outflow stenosis was performed of the narrowing just beyond the graft anastomosis. 10 mm and 12 mm balloon angioplasty  was performed. The 12 mm balloon was then used in an attempt to determine whether there was stenosis at the subclavian vein and at the innominate vein inflow into the superior vena cava. No narrowing was identified, with free movement of the 12 mm balloon 2 infero within the vein at each of these locations. Drug-eluting balloon was then used to treat the outflow stenosis beyond the graft material. Final venogram was performed. Z stitch was placed upon removal of the sheath. Sterile bandage was placed. Patient tolerated the procedure well and remained hemodynamically stable throughout. No complications were encountered and no significant blood loss. 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 The study demonstrates no significant stenosis at the subclavian vein or the innominate vein smaller than a 12 mm diameter. Signed, Yvone Neu. Loreta Ave, DO [facility] Radiology ACCESS: This access remains amenable to future percutaneous interventions as clinically indicated. Electronically Signed   By: Richard Barnett D.O.   On: 05/17/2017 15:27   Ir Pts And Stent Addl Central  Dialy Seg Thru Dialy Circuit Left  Result Date: 05/17/2017 INDICATION: 62 year old Demarious with a history of high venous pressures of left upper extremity dialysis circuit EXAM: IR ULTRASOUND GUIDANCE VASC ACCESS LEFT; IR AV DIALY SHUNT INTRO NEEDLE/INTRACATH INITIAL W/PTA/IMG*L*; IR PTA AND STENT ADDL CENTRAL DIALY SEG THRU DIALY CIRCUIT LEFT MEDICATIONS: None. ANESTHESIA/SEDATION: Moderate Sedation Time:  35 minutes The patient was continuously monitored during the procedure by the interventional radiology nurse under my direct supervision. FLUOROSCOPY TIME:  Fluoroscopy Time: 6 minutes 30 seconds (61 mGy). COMPLICATIONS: None PROCEDURE: Informed written consent was obtained from the patient after a thorough discussion of the procedural risks, benefits and alternatives. All questions were addressed. Maximal Sterile Barrier Technique was utilized including caps, mask, sterile gowns, sterile gloves, sterile drape, hand hygiene and skin antiseptic. A timeout was performed prior to the initiation of the procedure. Patient is positioned supine position with the left arm abducted. Left arm was prepped and draped in the usual sterile fashion. 1% lidocaine was used for local anesthesia. Ultrasound guidance was used for micropuncture access of the left upper extremity. Images were acquired. My Seldinger technique was used to place a 7 [place] sheath. Treatment of venous outflow stenosis was performed of the narrowing just beyond the graft anastomosis. 10 mm and 12 mm balloon angioplasty was performed. The 12 mm balloon was then used in an attempt to determine whether there was stenosis at the subclavian vein and at the innominate vein inflow into the superior vena cava. No narrowing was identified, with free movement of the 12 mm balloon 2 infero within the vein at each of these locations. Drug-eluting balloon was then used to treat the outflow stenosis beyond the graft material. Final venogram was performed. Z stitch was  placed upon removal of the sheath. Sterile bandage was placed. Patient tolerated the procedure well and remained hemodynamically stable throughout. No complications were encountered and no significant blood loss. 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 The study demonstrates no significant stenosis at the subclavian vein or the innominate vein smaller than a 12 mm diameter. Signed, Yvone Neu. Loreta Ave, DO [facility] Radiology ACCESS: This access remains amenable to future percutaneous interventions as clinically indicated. Electronically Signed   By: Marijean Niemann  Loreta Ave D.O.   On: 05/17/2017 15:27      Scheduled Meds: . amLODipine  10 mg Oral QHS  . aspirin EC  81 mg Oral Daily  . atorvastatin  80 mg Oral q1800  . clopidogrel  75 mg Oral Daily  . darbepoetin (ARANESP) injection - DIALYSIS  60 mcg Intravenous Q Mon-HD  . heparin injection (subcutaneous)  5,000 Units Subcutaneous Q8H  . isosorbide mononitrate  30 mg Oral Daily  . latanoprost  1 drop Both Eyes QHS  . metoprolol tartrate  50 mg Oral BID WC  . multivitamin  1 tablet Oral QHS  . pantoprazole  40 mg Oral Daily  . sertraline  50 mg Oral QHS   Continuous Infusions: . sodium chloride    . sodium chloride       LOS: 4 days    Time spent in minutes: 35  Penny Pia, MD [facility] Pager: [email or web address] Password [facility] 05/19/2017, 2:34 PM

## 2017-05-19 NOTE — Progress Notes (Signed)
[facility] Progress Note  Dialysis Orders: [facility] MWF 4hrs, 400/800, 2K/2Ca, EDW 101.5kg, LU AVG Calcitriol 0.64mcg qHD Venofer 100mg  IV qHD - 8/10 completed No Heparin  Assessment/Plan: 1. Chest pain -resolved - neg work up 2. 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 Anemia - hgb 9.4  Hgb trending down from 11 started Aranesp 60 3/11- finish course- of Fe; on plavix - check FOBT 4. Secondary hyperparathyroidism - calcitriol/no binders yet 5. HTN/volume - EDW +/- 101 prior to admission - orthostatic post HD yesterday -weight below edw; BP up some this am - sats good on room air; plan check orthostatic pre HD Friday and decide on goal base on pre weights/pre orthostatics 6. Nutrition - added vitamins 7. Suicidal ideation - for transfer to [facility] - medical issues resolved but no avail spots at [facility] at present  Sheffield Slider, PA-C [facility] Beeper [phone] 05/19/2017,9:07 AM  LOS: 4 days   Subjective:   Feels better this am. No Dizziness.  Objective Vitals:   05/18/17 2221 05/18/17 2230 05/19/17 0442 05/19/17 0830  BP:   140/81 (!) 152/72  Pulse: 99  79 84  Resp:      Temp:   98.3 F (36.8 C)   TempSrc:   Oral   SpO2:  100% 100%   Weight:   98.7 kg (217 lb 11.2 oz)   Height:       Physical Exam General: more engaging today.  Heart: RRR Lungs: no rales Abdomen: soft NT Extremities: no sig LE edema Dialysis Access: left upper AVGG + bruit -    Additional Objective Labs: Basic Metabolic Panel: Recent Labs  Lab 05/15/17 0529 05/16/17 0738 05/18/17 0703  NA 137 138 137  K 3.4* 3.6 4.0  CL 99* 100* 103  CO2 26 26 24   GLUCOSE 112* 117* 129*  BUN 31* 36* 35*  CREATININE 6.32* 6.90* 6.60*  CALCIUM 8.0* 8.7* 8.3*  PHOS  --  4.4 3.5   Liver Function Tests: Recent Labs  Lab 05/16/17 0738 05/18/17 0703  ALBUMIN 3.6 3.2*   No results for input(s): LIPASE, AMYLASE in the last 168  hours. CBC: Recent Labs  Lab 05/15/17 0529 05/16/17 0359 05/17/17 0540 05/18/17 0416 05/19/17 0618  WBC 5.4 5.7 8.6 9.6 10.8*  HGB 9.3* 9.1* 8.9* 9.5* 9.4*  HCT 29.0* 28.2* 28.1* 29.5* 29.1*  MCV 89.8 89.8 89.5 89.9 91.2  PLT 296 301 310 330 304   Blood Culture No results found for: SDES, SPECREQUEST, CULT, REPTSTATUS  Cardiac Enzymes: Recent Labs  Lab 05/13/17 2000 05/14/17 0216 05/14/17 0708  TROPONINI <0.03 <0.03 <0.03   CBG: Recent Labs  Lab 05/13/17 1930  GLUCAP 96   Iron Studies: No results for input(s): IRON, TIBC, TRANSFERRIN, FERRITIN in the last 72 hours. Lab Results  Component Value Date   INR 0.98 05/13/2017   INR 0.99 01/14/2017   INR 1.01 08/22/2014   Studies/Results: Ir US Guide Vasc Access Left  Result Date: 05/17/2017 INDICATION: 62 year old Danna with a history of high venous pressures of left upper extremity dialysis circuit EXAM: IR ULTRASOUND GUIDANCE VASC ACCESS LEFT; IR AV DIALY SHUNT INTRO NEEDLE/INTRACATH INITIAL W/PTA/IMG*L*; IR PTA AND STENT ADDL CENTRAL DIALY SEG THRU DIALY CIRCUIT LEFT MEDICATIONS: None. ANESTHESIA/SEDATION: Moderate Sedation Time:  35 minutes The patient was continuously monitored during the procedure by the interventional radiology nurse under my direct supervision. FLUOROSCOPY TIME:  Fluoroscopy Time: 6 minutes 30 seconds (  61 mGy). COMPLICATIONS: None PROCEDURE: Informed written consent was obtained from the patient after a thorough discussion of the procedural risks, benefits and alternatives. All questions were addressed. Maximal Sterile Barrier Technique was utilized including caps, mask, sterile gowns, sterile gloves, sterile drape, hand hygiene and skin antiseptic. A timeout was performed prior to the initiation of the procedure. Patient is positioned supine position with the left arm abducted. Left arm was prepped and draped in the usual sterile fashion. 1% lidocaine was used for local anesthesia. Ultrasound guidance was  used for micropuncture access of the left upper extremity. Images were acquired. My Seldinger technique was used to place a 7 [place] sheath. Treatment of venous outflow stenosis was performed of the narrowing just beyond the graft anastomosis. 10 mm and 12 mm balloon angioplasty was performed. The 12 mm balloon was then used in an attempt to determine whether there was stenosis at the subclavian vein and at the innominate vein inflow into the superior vena cava. No narrowing was identified, with free movement of the 12 mm balloon 2 infero within the vein at each of these locations. Drug-eluting balloon was then used to treat the outflow stenosis beyond the graft material. Final venogram was performed. Z stitch was placed upon removal of the sheath. Sterile bandage was placed. Patient tolerated the procedure well and remained hemodynamically stable throughout. No complications were encountered and no significant blood loss. 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 The study demonstrates no significant stenosis at the subclavian vein or the innominate vein smaller than a 12 mm diameter. Signed, Yvone Neu. Loreta Ave, DO [facility] Radiology ACCESS: This access remains amenable to future percutaneous interventions as clinically indicated. Electronically Signed   By: Gilmer Mor D.O.   On: 05/17/2017 15:27   Ir Av Dialy Shunt Intro Needle/intracath Initial W/pta/img Left  Result Date: 05/17/2017 INDICATION: 62 year old Bransyn with a history of high venous pressures of left upper extremity dialysis circuit EXAM: IR ULTRASOUND GUIDANCE VASC ACCESS LEFT; IR AV DIALY SHUNT INTRO NEEDLE/INTRACATH INITIAL W/PTA/IMG*L*; IR PTA AND STENT ADDL CENTRAL DIALY SEG THRU DIALY CIRCUIT LEFT MEDICATIONS: None. ANESTHESIA/SEDATION: Moderate Sedation Time:  35 minutes The  patient was continuously monitored during the procedure by the interventional radiology nurse under my direct supervision. FLUOROSCOPY TIME:  Fluoroscopy Time: 6 minutes 30 seconds (61 mGy). COMPLICATIONS: None PROCEDURE: Informed written consent was obtained from the patient after a thorough discussion of the procedural risks, benefits and alternatives. All questions were addressed. Maximal Sterile Barrier Technique was utilized including caps, mask, sterile gowns, sterile gloves, sterile drape, hand hygiene and skin antiseptic. A timeout was performed prior to the initiation of the procedure. Patient is positioned supine position with the left arm abducted. Left arm was prepped and draped in the usual sterile fashion. 1% lidocaine was used for local anesthesia. Ultrasound guidance was used for micropuncture access of the left upper extremity. Images were acquired. My Seldinger technique was used to place a 7 [place] sheath. Treatment of venous outflow stenosis was performed of the narrowing just beyond the graft anastomosis. 10 mm and 12 mm balloon angioplasty was performed. The 12 mm balloon was then used in an attempt to determine whether there was stenosis at the subclavian vein and at the innominate vein inflow into the superior vena cava. No narrowing was identified, with free movement of the 12 mm balloon 2 infero within  the vein at each of these locations. Drug-eluting balloon was then used to treat the outflow stenosis beyond the graft material. Final venogram was performed. Z stitch was placed upon removal of the sheath. Sterile bandage was placed. Patient tolerated the procedure well and remained hemodynamically stable throughout. No complications were encountered and no significant blood loss. 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 The study demonstrates no significant stenosis at the  subclavian vein or the innominate vein smaller than a 12 mm diameter. Signed, Yvone Neu. Loreta Ave, DO [facility] Radiology ACCESS: This access remains amenable to future percutaneous interventions as clinically indicated. Electronically Signed   By: Gilmer Mor D.O.   On: 05/17/2017 15:27   Ir Pts And Stent Addl Central Dialy Seg Thru Dialy Circuit Left  Result Date: 05/17/2017 INDICATION: 62 year old Bode with a history of high venous pressures of left upper extremity dialysis circuit EXAM: IR ULTRASOUND GUIDANCE VASC ACCESS LEFT; IR AV DIALY SHUNT INTRO NEEDLE/INTRACATH INITIAL W/PTA/IMG*L*; IR PTA AND STENT ADDL CENTRAL DIALY SEG THRU DIALY CIRCUIT LEFT MEDICATIONS: None. ANESTHESIA/SEDATION: Moderate Sedation Time:  35 minutes The patient was continuously monitored during the procedure by the interventional radiology nurse under my direct supervision. FLUOROSCOPY TIME:  Fluoroscopy Time: 6 minutes 30 seconds (61 mGy). COMPLICATIONS: None PROCEDURE: Informed written consent was obtained from the patient after a thorough discussion of the procedural risks, benefits and alternatives. All questions were addressed. Maximal Sterile Barrier Technique was utilized including caps, mask, sterile gowns, sterile gloves, sterile drape, hand hygiene and skin antiseptic. A timeout was performed prior to the initiation of the procedure. Patient is positioned supine position with the left arm abducted. Left arm was prepped and draped in the usual sterile fashion. 1% lidocaine was used for local anesthesia. Ultrasound guidance was used for micropuncture access of the left upper extremity. Images were acquired. My Seldinger technique was used to place a 7 [place] sheath. Treatment of venous outflow stenosis was performed of the narrowing just beyond the graft anastomosis. 10 mm and 12 mm balloon angioplasty was performed. The 12 mm balloon was then used in an attempt to determine  whether there was stenosis at the subclavian vein and at the innominate vein inflow into the superior vena cava. No narrowing was identified, with free movement of the 12 mm balloon 2 infero within the vein at each of these locations. Drug-eluting balloon was then used to treat the outflow stenosis beyond the graft material. Final venogram was performed. Z stitch was placed upon removal of the sheath. Sterile bandage was placed. Patient tolerated the procedure well and remained hemodynamically stable throughout. No complications were encountered and no significant blood loss. 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 The study demonstrates no significant stenosis at the subclavian vein or the innominate vein smaller than a 12 mm diameter. Signed, Yvone Neu. Loreta Ave, DO [facility] Radiology ACCESS: This access remains amenable to future percutaneous interventions as clinically indicated. Electronically Signed   By: Gilmer Mor D.O.   On: 05/17/2017 15:27   Medications: . sodium chloride    . sodium chloride     . amLODipine  10 mg Oral QHS  . aspirin EC  81 mg Oral Daily  . atorvastatin  80 mg Oral q1800  .  clopidogrel  75 mg Oral Daily  . darbepoetin (ARANESP) injection - DIALYSIS  60 mcg Intravenous Q Mon-HD  . heparin injection (subcutaneous)  5,000 Units Subcutaneous Q8H  . isosorbide mononitrate  30 mg Oral Daily  . latanoprost  1 drop Both Eyes QHS  . metoprolol tartrate  50 mg Oral BID WC  . multivitamin  1 tablet Oral QHS  . pantoprazole  40 mg Oral Daily  . sertraline  50 mg Oral QHS

## 2017-05-19 NOTE — Progress Notes (Signed)
CSW spoke to [facility] intake, [place]. They do not have a bed available for patient today. CSW to continue to follow for bed availability, is this is the only psychiatric facility that can take HD patients.  Abigail Butts, LCSWA [phone]

## 2017-05-20 DIAGNOSIS — F4325 Adjustment disorder with mixed disturbance of emotions and conduct: Secondary | ICD-10-CM

## 2017-05-20 LAB — RENAL FUNCTION PANEL
Albumin: 3.7 g/dL (ref 3.5–5.0)
Anion gap: 12 (ref 5–15)
BUN: 25 mg/dL — ABNORMAL HIGH (ref 6–20)
CO2: 24 mmol/L (ref 22–32)
Calcium: 9 mg/dL (ref 8.9–10.3)
Chloride: 101 mmol/L (ref 101–111)
Creatinine, Ser: 6.8 mg/dL — ABNORMAL HIGH (ref 0.61–1.24)
GFR calc Af Amer: 9 mL/min — ABNORMAL LOW (ref >60)
GFR calc non Af Amer: 8 mL/min — ABNORMAL LOW (ref >60)
Glucose, Bld: 139 mg/dL — ABNORMAL HIGH (ref 65–99)
Phosphorus: 3.3 mg/dL (ref 2.5–4.6)
Potassium: 3.7 mmol/L (ref 3.5–5.1)
Sodium: 137 mmol/L (ref 135–145)

## 2017-05-20 LAB — CBC
HCT: 29.2 % — ABNORMAL LOW (ref 39.0–52.0)
Hemoglobin: 9.4 g/dL — ABNORMAL LOW (ref 13.0–17.0)
MCH: 29.2 pg (ref 26.0–34.0)
MCHC: 32.2 g/dL (ref 30.0–36.0)
MCV: 90.7 fL (ref 78.0–100.0)
Platelets: 342 10*3/uL (ref 150–400)
RBC: 3.22 MIL/uL — ABNORMAL LOW (ref 4.22–5.81)
RDW: 14.7 % (ref 11.5–15.5)
WBC: 8.8 10*3/uL (ref 4.0–10.5)

## 2017-05-20 MED ORDER — HEPARIN SODIUM (PORCINE) 1000 UNIT/ML DIALYSIS
1000.0000 [IU] | INTRAMUSCULAR | Status: DC | PRN
Start: 2017-05-20 — End: 2017-05-20
  Filled 2017-05-20: qty 1

## 2017-05-20 MED ORDER — PENTAFLUOROPROP-TETRAFLUOROETH EX AERO: 1.0000 "application " | INHALATION_SPRAY | CUTANEOUS | Status: DC | PRN

## 2017-05-20 MED ORDER — SODIUM CHLORIDE 0.9 % IV SOLN: 100.0000 mL | INTRAVENOUS | Status: DC | PRN

## 2017-05-20 MED ORDER — ALTEPLASE 2 MG IJ SOLR: 2.0000 mg | Freq: Once | INTRAMUSCULAR | Status: DC | PRN

## 2017-05-20 MED ORDER — LIDOCAINE-PRILOCAINE 2.5-2.5 % EX CREA
1.0000 "application " | TOPICAL_CREAM | CUTANEOUS | Status: DC | PRN
  Filled 2017-05-20: qty 5

## 2017-05-20 MED ORDER — LIDOCAINE HCL (PF) 1 % IJ SOLN: 5.0000 mL | INTRAMUSCULAR | Status: DC | PRN

## 2017-05-20 NOTE — Progress Notes (Signed)
[facility] Progress Note   Subjective:  Seen in room, sitter present Upbeat, making jokes. No new c/os.  For HD today   Objective Vitals:   05/19/17 0442 05/19/17 0830 05/19/17 1653 05/19/17 2142  BP: 140/81 (!) 152/72 133/79 129/73  Pulse: 79 84 75 76  Resp:   18 16  Temp: 98.3 F (36.8 C)  98 F (36.7 C) 98.5 F (36.9 C)  TempSrc: Oral  Oral Oral  SpO2: 100%  100% 100%  Weight: 98.7 kg (217 lb 11.2 oz)     Height:       Physical Exam General: WNWD Richard Barnett sitting up in bed NAD Heart: RRR Lungs: CTAB Abdomen: soft NT Extremities: No LE edema  Dialysis Access: LUE AVF +bruit   Dialysis Orders:  [facility] MWF 4hrs, 400/800, 2K/2Ca, EDW 101.5kg, LU AVG Calcitriol 0.7mcg qHD Venofer 100mg  IV qHD - 8/10 completed No Heparin  Assessment/Plan: 1.Chest pain-resolved - neg work up 2. ESRD- MWF - HD foday--s/p venous outflow stenosis by IR 3/12 3. Anemia- hgb 9.4  Hgb trending down from 11 started Aranesp 60 3/11- finish course- of Fe; on plavix - check FOBT 4. Secondary hyperparathyroidism- calcitriol/no binders yet 5.HTN/volume- EDW +/- 101 prior to admission - orthostatic post HD Wed and below edw; BP up some this am - sats good on room air; plan check orthostatic pre HD Friday and decide on goal base on pre weights/pre orthostatics 6. Nutrition- added vitamins 7.Suicidal ideation -for transfer to [facility] - medical issues resolved but no avail spots at [facility] at present    Tomasa Blase PA-C [facility] Pager [phone] 05/20/2017,8:31 AM  LOS: 5 days   Additional Objective Labs: Basic Metabolic Panel: Recent Labs  Lab 05/15/17 0529 05/16/17 0738 05/18/17 0703  NA 137 138 137  K 3.4* 3.6 4.0  CL 99* 100* 103  CO2 26 26 24   GLUCOSE 112* 117* 129*  BUN 31* 36* 35*  CREATININE 6.32* 6.90* 6.60*  CALCIUM 8.0* 8.7* 8.3*  PHOS  --  4.4 3.5   CBC: Recent Labs  Lab 05/15/17 0529 05/16/17 0359 05/17/17 0540  05/18/17 0416 05/19/17 0618  WBC 5.4 5.7 8.6 9.6 10.8*  HGB 9.3* 9.1* 8.9* 9.5* 9.4*  HCT 29.0* 28.2* 28.1* 29.5* 29.1*  MCV 89.8 89.8 89.5 89.9 91.2  PLT 296 301 310 330 304   Blood Culture No results found for: SDES, SPECREQUEST, CULT, REPTSTATUS  Cardiac Enzymes: Recent Labs  Lab 05/13/17 2000 05/14/17 0216 05/14/17 0708  TROPONINI <0.03 <0.03 <0.03   CBG: Recent Labs  Lab 05/13/17 1930  GLUCAP 96   Iron Studies: No results for input(s): IRON, TIBC, TRANSFERRIN, FERRITIN in the last 72 hours. Lab Results  Component Value Date   INR 0.98 05/13/2017   INR 0.99 01/14/2017   INR 1.01 08/22/2014   Medications: . sodium chloride    . sodium chloride     . amLODipine  10 mg Oral QHS  . aspirin EC  81 mg Oral Daily  . atorvastatin  80 mg Oral q1800  . clopidogrel  75 mg Oral Daily  . darbepoetin (ARANESP) injection - DIALYSIS  60 mcg Intravenous Q Mon-HD  . heparin injection (subcutaneous)  5,000 Units Subcutaneous Q8H  . isosorbide mononitrate  30 mg Oral Daily  . latanoprost  1 drop Both Eyes QHS  . metoprolol tartrate  50 mg Oral BID WC  . multivitamin  1 tablet Oral QHS  . pantoprazole  40 mg Oral  Daily  . sertraline  50 mg Oral QHS

## 2017-05-20 NOTE — Progress Notes (Signed)
PROGRESS NOTE  Richard Barnett   WUJ:[id number]  DOB: [birth year]  DOA: 05/13/2017 PCP: Patient, No Pcp Per   Brief Narrative:  Richard Barnett  a 62 y.o. Richard Barnett with medical history significant of ESRD-HD (MWF, justed started in 04/2017), hypertension, hyperlipidemia, diet-controlled diabetes, CAD, stent placement 01/2017, GERD, depression, DVT, who presents with chest pain. This was left sided chest pain which occurred while on dialysis, felt like a cramp ,  was severe (10/10) and non radiating. He tells me that he has been getting a similar pain when he takes deep breaths. No respiratory symptoms or fevers.   Reportedly he stated that he hates himself and wants to kill himself. He has not thought out a plan per prior note.     Subjective: Pt has no new complaints currently. He denies any suicide ideation and would like to be reevaluated.  Assessment & Plan:   Principal Problem:   Chest pain - worse when taking a deep breath- persistent while on dialysis but now intermittent again - d dimer was elevated: CTA chest negative, LE venous duplex negative - negative troponin - cardiology has evaluated him and feels that he does not need any further work up as he just had a Cardiac cath in 11/18 which did not reveal and concerning major stenosis. -cont ASA, Plavix and Statin - 3/10- chest pain has resolved    Active Problems:   Suicidal ideation - currently denies suicide ideation and would like reevaluation. Psych reconsulted. - Zoloft started by psych     ESRD on dialysis   - he was being dialyzed today when it was noted he may have a stenosis of his fistula- dialysis was not completed -  Nephrology has consulted IR for a shuntogram- he was noted to have a stenosis which IR will correct today - nephrology dialyzed patient to test access.  Patient had hypotension and some adjustments were made. Pt to go to dialysis again today.    Hypokalemia - replaced  And resolved. 3.7 on last check.    Hypertension - Norvasc, Imdur, Metoprolol, PRN Hydralazine    Hyperlipidemia - LDL 107- cont Lipitor    GERD (gastroesophageal reflux disease) - stable PPI  AOCD - follow - Hb 9.8 today   DVT prophylaxis: Heparin Code Status: Full code Family Communication:  Disposition Plan: To inpatient psychiatric facility once bed available Consultants:   cardiology Procedures:    Antimicrobials:  Anti-infectives (From admission, onward)   None     Objective: Vitals:   05/20/17 1300 05/20/17 1315 05/20/17 1332 05/20/17 1419  BP: (!) 157/97 (!) 157/97 (!) 168/99 135/84  Pulse: 79 79 80 93  Resp:  16    Temp:  98.6 F (37 C)  98.4 F (36.9 C)  TempSrc:    Oral  SpO2:    100%  Weight:  99.3 kg (218 lb 14.7 oz)    Height:        Intake/Output Summary (Last 24 hours) at 05/20/2017 1506 Last data filed at 05/20/2017 1413 Gross per 24 hour  Intake 900 ml  Output 290 ml  Net 610 ml   Filed Weights   05/19/17 0442 05/20/17 1018 05/20/17 1315  Weight: 98.7 kg (217 lb 11.2 oz) 99.3 kg (218 lb 14.7 oz) 99.3 kg (218 lb 14.7 oz)    Examination: Exam unchanged when compared to prior on 05/18/2017 General exam: Appears comfortable, in nad. HEENT: PERRLA, oral mucosa moist, no sclera icterus or thrush Respiratory system: Clear to auscultation.  Respiratory effort normal. Cardiovascular system: S1 & S2 heard,  No murmurs  Gastrointestinal system: Abdomen soft, non-tender, nondistended. Normal bowel sound. No organomegaly Central nervous system: Alert and oriented. No focal neurological deficits. Extremities: No cyanosis, clubbing or edema Skin: No rashes or ulcers Psychiatry:  Mood & affect appropriate.   Data Reviewed: I have personally reviewed following labs and imaging studies  CBC: Recent Labs  Lab 05/16/17 0359 05/17/17 0540 05/18/17 0416 05/19/17 0618 05/20/17 0941  WBC 5.7 8.6 9.6 10.8* 8.8  HGB 9.1* 8.9* 9.5* 9.4* 9.4*  HCT 28.2* 28.1* 29.5* 29.1* 29.2*  MCV  89.8 89.5 89.9 91.2 90.7  PLT 301 310 330 304 342   Basic Metabolic Panel: Recent Labs  Lab 05/13/17 1845  05/14/17 0809 05/15/17 0529 05/16/17 0738 05/18/17 0703 05/20/17 0941  NA  --    < > 137 137 138 137 137  K  --    < > 3.5 3.4* 3.6 4.0 3.7  CL  --    < > 97* 99* 100* 103 101  CO2  --   --  25 26 26 24 24   GLUCOSE  --    < > 120* 112* 117* 129* 139*  BUN  --    < > 25* 31* 36* 35* 25*  CREATININE  --    < > 5.36* 6.32* 6.90* 6.60* 6.80*  CALCIUM  --   --  7.9* 8.0* 8.7* 8.3* 9.0  MG 1.8  --   --   --   --   --   --   PHOS  --   --   --   --  4.4 3.5 3.3   < > = values in this interval not displayed.   GFR: Estimated Creatinine Clearance: 12.6 mL/min (A) (by C-G formula based on SCr of 6.8 mg/dL (H)). Liver Function Tests: Recent Labs  Lab 05/16/17 0738 05/18/17 0703 05/20/17 0941  ALBUMIN 3.6 3.2* 3.7   No results for input(s): LIPASE, AMYLASE in the last 168 hours. No results for input(s): AMMONIA in the last 168 hours. Coagulation Profile: Recent Labs  Lab 05/13/17 1845  INR 0.98   Cardiac Enzymes: Recent Labs  Lab 05/13/17 2000 05/14/17 0216 05/14/17 0708  TROPONINI <0.03 <0.03 <0.03   BNP (last 3 results) No results for input(s): PROBNP in the last 8760 hours. HbA1C: No results for input(s): HGBA1C in the last 72 hours. CBG: Recent Labs  Lab 05/13/17 1930  GLUCAP 96   Lipid Profile: No results for input(s): CHOL, HDL, LDLCALC, TRIG, CHOLHDL, LDLDIRECT in the last 72 hours. Thyroid Function Tests: No results for input(s): TSH, T4TOTAL, FREET4, T3FREE, THYROIDAB in the last 72 hours. Anemia Panel: No results for input(s): VITAMINB12, FOLATE, FERRITIN, TIBC, IRON, RETICCTPCT in the last 72 hours. Urine analysis:    Component Value Date/Time   COLORURINE YELLOW 04/07/2017 0301   APPEARANCEUR CLEAR 04/07/2017 0301   LABSPEC 1.013 04/07/2017 0301   PHURINE 6.0 04/07/2017 0301   GLUCOSEU 150 (A) 04/07/2017 0301   HGBUR MODERATE (A)  04/07/2017 0301   BILIRUBINUR NEGATIVE 04/07/2017 0301   KETONESUR NEGATIVE 04/07/2017 0301   PROTEINUR >=300 (A) 04/07/2017 0301   UROBILINOGEN 0.2 02/21/2015 0955   NITRITE NEGATIVE 04/07/2017 0301   LEUKOCYTESUR NEGATIVE 04/07/2017 0301   Sepsis Labs: @LABRCNTIP (procalcitonin:4,lacticidven:4) )No results found for this or any previous visit (from the past 240 hour(s)).       Radiology Studies: No results found.    Scheduled Meds: . amLODipine  10  mg Oral QHS  . aspirin EC  81 mg Oral Daily  . atorvastatin  80 mg Oral q1800  . clopidogrel  75 mg Oral Daily  . darbepoetin (ARANESP) injection - DIALYSIS  60 mcg Intravenous Q Mon-HD  . heparin injection (subcutaneous)  5,000 Units Subcutaneous Q8H  . isosorbide mononitrate  30 mg Oral Daily  . latanoprost  1 drop Both Eyes QHS  . metoprolol tartrate  50 mg Oral BID WC  . multivitamin  1 tablet Oral QHS  . pantoprazole  40 mg Oral Daily  . sertraline  50 mg Oral QHS   Continuous Infusions:    LOS: 5 days    Time spent in minutes: 35  Penny Pia, MD [facility] Pager: [email or web address] Password [facility] 05/20/2017, 3:06 PM

## 2017-05-20 NOTE — Progress Notes (Signed)
CSW continuing to follow for inpatient psychiatric placement. Note plan for psych to re-evaluate. CSW to follow for updated psych recommendations and support with disposition.  Abigail Butts, LCSWA [phone]

## 2017-05-20 NOTE — Consult Note (Signed)
[facility] Psych Consult Progress Note  05/20/2017 12:13 PM Gustavus AVIV LENGACHER  MRN:  [id number] Subjective:   Mr. Bunn was admitted with chest pain. He was last seen on 3/9 by the psychiatry consult service. He reported depression with SI in the setting of multiple medical problems. He denied a plan. He reported poor compliance with medications. He was recommended for inpatient psychiatric hospitalization. Zoloft 50 mg daily was started. He was accepted to [facility] but was not able to transfer for several days due to problems with his dialysis access. They no longer have a bed available and are the only facility that can provide care for patients on dialysis. Primary team would like reevaluation since he reports an improvement in his mood. Per nursing note, patient refused assessment today and was upset.   On interview, Mr. Schank denies SI. He reports no further suicidal thoughts since he was evaluated by psychiatry on 3/9. He attributes these prior thoughts to feeling frustrated about hemodialysis. He denies access to weapons or guns. He denies a prior history of suicide attempts. He denies HI or AVH. He denies problems with sleep or appetite. He feels safe to discharge home. He will follow up with his PCP for further management of Zoloft. He denies problems with this medication.   Principal Problem: Adjustment disorder with mixed disturbance of emotions and conduct Diagnosis:   Patient Active Problem List   Diagnosis Date Noted  . Major depressive disorder, recurrent episode, severe ([place]) [F33.2] 05/14/2017  . GERD (gastroesophageal reflux disease) [K21.9] 05/13/2017  . ESRD on dialysis ([place]) [N18.6, Z99.2] 05/13/2017  . Suicidal ideation [R45.851] 05/13/2017  . NSTEMI (non-ST elevated myocardial infarction) ([place]) [I21.4] 01/27/2017  . Diabetes mellitus ([place]) [E11.9]   . Anemia of chronic renal failure [N18.9, D63.1]   . Secondary hyperparathyroidism of renal origin ([place]) [N25.81]   . Proteinuria  [R80.9]   . Arthritis [M19.90]   . Depression [F32.9]   . Deep vein thrombosis (HCC) [I82.409]   . Myocardial infarction ([place]) [I21.9]   . Heart failure, unspecified ([place]) [I50.9]   . Heart murmur [R01.1]   . Prediabetes [R73.03] 02/22/2015  . Chronic kidney disease, stage IV (severe) ([place]) [N18.4]   . Hyperlipidemia [E78.5] 05/22/2014  . Chest pain [R07.9] 05/21/2014  . Hypokalemia [E87.6] 05/21/2014  . CAD (coronary artery disease) [I25.10] 05/21/2014  . Hypertension [I10] 05/21/2014   Total Time spent with patient: 15 minutes  Past Psychiatric History: MDD  Past Medical History:  Past Medical History:  Diagnosis Date  . Arthritis    "right leg" (08/22/2014)  . CAD (coronary artery disease)    a. CAD s/p 2 stent placement in [place] 2016. b. Neg nuc 05/2014 & 01/2016.  Marland Kitchen Chest pain 01/2017  . CKD (chronic kidney disease), stage IV ([place])    Archie Endo 08/22/2014  . Daily headache   . Depression    "I always get that" (08/22/2014)  . Diabetes mellitus ([place])   . DVT (deep venous thrombosis) ([place]) ?   RLE  . GERD (gastroesophageal reflux disease)   . Heart murmur   . Hypercholesterolemia   . Hypertension   . Myocardial infarction ([place])   . Wears glasses     Past Surgical History:  Procedure Laterality Date  . AV FISTULA PLACEMENT Left 10/01/2016   Procedure: INSERTION OF 4-7MM X 45CM ARTERIOVENOUS (AV) GORE-TEX GRAFT ARM;  Surgeon: Angelia Mould, MD;  Location: [facility];  Service: Vascular;  Laterality: Left;  . CORONARY ANGIOPLASTY WITH STENT PLACEMENT     "  1 + 1"  . DRAINAGE AND CLOSURE OF LYMPHOCELE Left 10/29/2016   Procedure: EVACUATION OF LYMPHOCELE LEFT ARM ARTERIOVENOUS GRAFT;  Surgeon: Angelia Mould, MD;  Location: [facility];  Service: Vascular;  Laterality: Left;  . IR AV DIALY SHUNT INTRO NEEDLE/INTRACATH INITIAL W/PTA/IMG LEFT  05/17/2017  . IR DIALY SHUNT INTRO NEEDLE/INTRACATH INITIAL W/IMG LEFT Left 05/16/2017  . IR PTA ADDL CENTRAL DIALYSIS SEG THRU  DIALY CIRCUIT LEFT Left 05/17/2017  . IR US GUIDE VASC ACCESS LEFT  05/17/2017  . LEFT HEART CATH AND CORONARY ANGIOGRAPHY N/A 01/14/2017   Procedure: LEFT HEART CATH AND CORONARY ANGIOGRAPHY;  Surgeon: Martinique, Peter M, MD;  Location: [facility];  Service: Cardiovascular;  Laterality: N/A;   Family History:  Family History  Problem Relation Age of Onset  . Hypertension Mother   . Kidney disease Mother   . Heart disease Mother   . Heart disease Father   . Hypertension Father    Family Psychiatric  History: Unknown  Social History:  Social History   Substance and Sexual Activity  Alcohol Use No   Comment: 10/27/16 "stopped in ~ 2011"     Social History   Substance and Sexual Activity  Drug Use No   Comment: 10/27/16 "stopped in ~ 2011; all types of drugs""    Social History   Socioeconomic History  . Marital status: Divorced    Spouse name: None  . Number of children: None  . Years of education: None  . Highest education level: None  Social Needs  . Financial resource strain: None  . Food insecurity - worry: None  . Food insecurity - inability: None  . Transportation needs - medical: None  . Transportation needs - non-medical: None  Occupational History  . None  Tobacco Use  . Smoking status: Former Smoker    Packs/day: 0.00    Years: 40.00    Pack years: 0.00    Types: Cigarettes    Last attempt to quit: 08/29/2010    Years since quitting: 6.7  . Smokeless tobacco: Never Used  . Tobacco comment: "quit smoking cigarettes in  2011" 10/27/16  Substance and Sexual Activity  . Alcohol use: No    Comment: 10/27/16 "stopped in ~ 2011"  . Drug use: No    Comment: 10/27/16 "stopped in ~ 2011; all types of drugs""  . Sexual activity: No  Other Topics Concern  . None  Social History Narrative  . None    Sleep: Good  Appetite:  Good  Current Medications: Current Facility-Administered Medications  Medication Dose Route Frequency Provider Last Rate Last Dose   . 0.9 %  sodium chloride infusion  100 mL Intravenous PRN Alric Seton, PA-C      . 0.9 %  sodium chloride infusion  100 mL Intravenous PRN Alric Seton, PA-C      . 0.9 %  sodium chloride infusion  100 mL Intravenous PRN Alric Seton, PA-C      . 0.9 %  sodium chloride infusion  100 mL Intravenous PRN Alric Seton, PA-C      . acetaminophen (TYLENOL) tablet 650 mg  650 mg Oral Q4H PRN Ivor Costa, MD      . alteplase (CATHFLO ACTIVASE) injection 2 mg  2 mg Intracatheter Once PRN Alric Seton, PA-C      . amLODipine (NORVASC) tablet 10 mg  10 mg Oral QHS Alric Seton, PA-C   10 mg at 05/19/17 2132  . aspirin EC tablet 81 mg  81 mg Oral Daily Ivor Costa, MD   81 mg at 05/19/17 0830  . atorvastatin (LIPITOR) tablet 80 mg  80 mg Oral q1800 Ivor Costa, MD   80 mg at 05/19/17 1652  . clopidogrel (PLAVIX) tablet 75 mg  75 mg Oral Daily Ivor Costa, MD   75 mg at 05/19/17 0830  . Darbepoetin Alfa (ARANESP) injection 60 mcg  60 mcg Intravenous Q Mon-HD Pearson Grippe B, MD   60 mcg at 05/16/17 1050  . heparin injection 1,000 Units  1,000 Units Dialysis PRN Alric Seton, PA-C      . heparin injection 5,000 Units  5,000 Units Subcutaneous Q8H Debbe Odea, MD   5,000 Units at 05/19/17 2133  . hydrALAZINE (APRESOLINE) injection 5 mg  5 mg Intravenous Q2H PRN Ivor Costa, MD      . isosorbide mononitrate (IMDUR) 24 hr tablet 30 mg  30 mg Oral Daily Ivor Costa, MD   30 mg at 05/19/17 0831  . latanoprost (XALATAN) 0.005 % ophthalmic solution 1 drop  1 drop Both Eyes QHS Ivor Costa, MD   1 drop at 05/19/17 2133  . lidocaine (PF) (XYLOCAINE) 1 % injection 5 mL  5 mL Intradermal PRN Alric Seton, PA-C      . lidocaine-prilocaine (EMLA) cream 1 application  1 application Topical PRN Alric Seton, PA-C      . loperamide (IMODIUM) 1 MG/5ML solution 1 mg  1 mg Oral PRN Velvet Bathe, MD   1 mg at 05/18/17 2228  . metoprolol tartrate (LOPRESSOR) tablet 50 mg  50 mg Oral BID WC Ivor Costa, MD    50 mg at 05/19/17 1653  . multivitamin (RENA-VIT) tablet 1 tablet  1 tablet Oral QHS Alric Seton, PA-C   1 tablet at 05/19/17 2132  . nitroGLYCERIN (NITROSTAT) SL tablet 0.4 mg  0.4 mg Sublingual Q5 min PRN Ivor Costa, MD   0.4 mg at 05/14/17 0335  . ondansetron (ZOFRAN-ODT) disintegrating tablet 4 mg  4 mg Oral Q8H PRN Velvet Bathe, MD      . oxyCODONE-acetaminophen (PERCOCET/ROXICET) 5-325 MG per tablet 1 tablet  1 tablet Oral Q6H PRN Ivor Costa, MD   1 tablet at 05/14/17 0006  . pantoprazole (PROTONIX) EC tablet 40 mg  40 mg Oral Daily Ivor Costa, MD   40 mg at 05/19/17 0831  . pentafluoroprop-tetrafluoroeth (GEBAUERS) aerosol 1 application  1 application Topical PRN Alric Seton, PA-C      . sertraline (ZOLOFT) tablet 50 mg  50 mg Oral QHS Debbe Odea, MD   50 mg at 05/19/17 2133  . zolpidem (AMBIEN) tablet 5 mg  5 mg Oral QHS PRN,MR X 1 Ivor Costa, MD        Lab Results:  Results for orders placed or performed during the hospital encounter of 05/13/17 (from the past 48 hour(s))  CBC     Status: Abnormal   Collection Time: 05/19/17  6:18 AM  Result Value Ref Range   WBC 10.8 (H) 4.0 - 10.5 K/uL   RBC 3.19 (L) 4.22 - 5.81 MIL/uL   Hemoglobin 9.4 (L) 13.0 - 17.0 g/dL   HCT 29.1 (L) 39.0 - 52.0 %   MCV 91.2 78.0 - 100.0 fL   MCH 29.5 26.0 - 34.0 pg   MCHC 32.3 30.0 - 36.0 g/dL   RDW 14.9 11.5 - 15.5 %   Platelets 304 150 - 400 K/uL    Comment: Performed at [facility], [place] [address] [place], [place] [address]  Renal function panel     Status: Abnormal   Collection Time: 05/20/17  9:41 AM  Result Value Ref Range   Sodium 137 135 - 145 mmol/L   Potassium 3.7 3.5 - 5.1 mmol/L   Chloride 101 101 - 111 mmol/L   CO2 24 22 - 32 mmol/L   Glucose, Bld 139 (H) 65 - 99 mg/dL   BUN 25 (H) 6 - 20 mg/dL   Creatinine, Ser 6.80 (H) 0.61 - 1.24 mg/dL   Calcium 9.0 8.9 - 10.3 mg/dL   Phosphorus 3.3 2.5 - 4.6 mg/dL   Albumin 3.7 3.5 - 5.0 g/dL   GFR calc non Af Amer 8 (L) >60  mL/min   GFR calc Af Amer 9 (L) >60 mL/min    Comment: (NOTE) The eGFR has been calculated using the CKD EPI equation. This calculation has not been validated in all clinical situations. eGFR's persistently <60 mL/min signify possible Chronic Kidney Disease.    Anion gap 12 5 - 15    Comment: Performed at [facility] [address] [place], [place] [address]    Blood Alcohol level:  Lab Results  Component Value Date   [facility] <5 08/28/2014   ETH <5 08/22/2014    Musculoskeletal: Strength & Muscle Tone: within normal limits Gait & Station: UTA since patient is lying in bed. Patient leans: N/A  Psychiatric Specialty Exam: Physical Exam  Constitutional: He is oriented to person, place, and time. He appears well-developed and well-nourished.  HENT:  Head: Normocephalic and atraumatic.  Neck: Normal range of motion.  Respiratory: Effort normal.  Musculoskeletal: Normal range of motion.  Neurological: He is alert and oriented to person, place, and time.  Psychiatric: He has a normal mood and affect. His speech is normal and behavior is normal. Judgment and thought content normal. Cognition and memory are normal.    Review of Systems  Cardiovascular: Negative for chest pain.  Gastrointestinal: Negative for abdominal pain, constipation, diarrhea, nausea and vomiting.  Musculoskeletal:       Left arm pain at HD site.  Psychiatric/Behavioral: Negative for hallucinations and suicidal ideas. The patient does not have insomnia.   All other systems reviewed and are negative.   Blood pressure (!) 153/85, pulse 73, temperature 98.9 F (37.2 C), resp. rate 16, height 5' 7" (1.702 m), weight 99.3 kg (218 lb 14.7 oz), SpO2 100 %.Body mass index is 34.29 kg/m.  General Appearance: Fairly Groomed, middle aged, African American [place], wearing a hospital gown and lying in bed. NAD.   Eye Contact:  Good  Speech:  Clear and Coherent and Normal Rate  Volume:  Normal  Mood:  "Better"   Affect:  Constricted  Thought Process:  Goal Directed, Linear and Descriptions of Associations: Intact  Orientation:  Full (Time, Place, and Person)  Thought Content:  Logical  Suicidal Thoughts:  No  Homicidal Thoughts:  No  Memory:  Immediate;   Good Recent;   Good Remote;   Good  Judgement:  Fair  Insight:  Fair  Psychomotor Activity:  Normal  Concentration:  Concentration: Good and Attention Span: Good  Recall:  Good  Fund of Knowledge:  Good  Language:  Good  Akathisia:  No  Handed:  Right  AIMS (if indicated):   N/A  Assets:  Communication Skills Housing  ADL's:  Intact  Cognition:  WNL  Sleep:   Okay    Assessment:  Elisah JIHAD BROWNLOW is a 62 y.o. [place] who was admitted with chest  pain. He endorsed depression with SI due to medical condition. He was recommended for inpatient psychiatric hospitalization although this plan was delayed due to HD access. Psychiatry was asked to reevaluate patient due to mood improving in the interim. He denies SI since last evaluation and reports endorsing SI at this time due to frustration with hemodialysis. He is able to safety plan and is psychiatrically cleared. He was informed to follow up with his PCP for medication management or his PCP should refer him to a mental health provider if he is unable to manage his medication.   Treatment Plan Summary: -Continue Zoloft 50 mg qhs for depression. -Patient reports that he will have his PCP continue to prescribe Zoloft. He was informed to ask his PCP to refer him to a mental health provider if he is unable to manage his medication.  -Patient is psychiatrically cleared. Psychiatry will sign off on patient at this time. Please consult psychiatry again as needed.    Faythe Dingwall, DO 05/20/2017, 12:13 PM

## 2017-05-20 NOTE — Progress Notes (Signed)
Dialysis treatment terminated with 1.45 remaining d/t infiltrated venous access X 2.  Unable to return blood.  540 mL ultrafiltrated and net fluid removal 290 mL.    Patient status unchanged. Lung sounds diminished to ausculation in all fields. Generalized edema. Cardiac: NSR.  Disconnected lines and removed needles.  Pressure held for 10 minutes and band aid/gauze dressing applied.  Report given to bedside RN, Ray.

## 2017-05-20 NOTE — Progress Notes (Signed)
Patient arrived to unit per bed.  Reviewed treatment plan and this RN agrees.  Report received from bedside RN, Ray.  Consent verified.  Patient A & O X 4. Lung sounds diminished to ausculation in all fields. LUE no pitting edema. Cardiac: NSR.  Prepped LUAVG with alcohol and cannulated with two 15 gauge needles.  Pulsation of blood noted.  Flushed access well with saline per protocol.  Connected and secured lines and initiated tx at 1056.  UF goal of 1000 mL and net fluid removal of 500 mL.  Will continue to monitor.

## 2017-05-20 NOTE — Progress Notes (Signed)
Pt was upset this AM . Attempted to do assesment but pt had refused. Pt states" Don't touch me, go your way" as pt pt was pointing towards the door.

## 2017-05-21 DIAGNOSIS — F4325 Adjustment disorder with mixed disturbance of emotions and conduct: Secondary | ICD-10-CM

## 2017-05-21 LAB — CBC
HEMATOCRIT: 30.2 % — AB (ref 39.0–52.0)
Hemoglobin: 9.5 g/dL — ABNORMAL LOW (ref 13.0–17.0)
MCH: 28.9 pg (ref 26.0–34.0)
MCHC: 31.5 g/dL (ref 30.0–36.0)
MCV: 91.8 fL (ref 78.0–100.0)
Platelets: 272 10*3/uL (ref 150–400)
RBC: 3.29 MIL/uL — ABNORMAL LOW (ref 4.22–5.81)
RDW: 14.7 % (ref 11.5–15.5)
WBC: 9.5 10*3/uL (ref 4.0–10.5)

## 2017-05-21 MED ORDER — SERTRALINE HCL 50 MG PO TABS: 50.0000 mg | ORAL_TABLET | Freq: Every day | ORAL | 0 refills | Status: AC

## 2017-05-21 NOTE — Discharge Summary (Signed)
Physician Discharge Summary  San [name]:[id number] DOB: [birth year] DOA: 05/13/2017  PCP: Patient, No Pcp Per  Admit date: 05/13/2017 Discharge date: 05/21/2017  Time spent: >  Recommendations for Outpatient Follow-up:  1. Please ensure patient f/u with nephrologist   Discharge Diagnoses:  Principal Problem:   Adjustment disorder with mixed disturbance of emotions and conduct Active Problems:   Chest pain   Hypokalemia   CAD (coronary artery disease)   Hypertension   Hyperlipidemia   GERD (gastroesophageal reflux disease)   ESRD on dialysis (HCC)   Suicidal ideation   Major depressive disorder, recurrent episode, severe (HCC)   Discharge Condition: stable  Diet recommendation: renal diet  Filed Weights   05/20/17 1018 05/20/17 1315 05/21/17 0446  Weight: 99.3 kg (218 lb 14.7 oz) 99.3 kg (218 lb 14.7 oz) 98.2 kg (216 lb 9.6 oz)    History of present illness:  62 y.o. Richard Barnett with medical history significant of ESRD-HD (MWF, justed started in 04/2017), hypertension, hyperlipidemia, diet-controlled diabetes, CAD, stent placement 01/2017, GERD, depression, DVT, who presents with chest pain.  Hospital course complicated by suicidal ideation (resolved) and problems with access for dialysis (resolved)  Hospital Course:  Chest pain - worse when taking a deep breath- persistent while on dialysis but now intermittent again - d dimer was elevated: CTA chest negative, LE venous duplex negative - negative troponin - cardiology has evaluated him and feels that he does not need any further work up as he just had a Cardiac cath in 11/18 which did not reveal and concerning major stenosis. -cont ASA, Plavix and Statin - 3/10- chest pain has resolved    Active Problems:   Suicidal ideation - currently denies suicide ideation and would like reevaluation. Psych cleared for d/c. D/c suicide precautions, discussed with nursing. - Zoloft started by psych     ESRD on dialysis    - he was being dialyzed today when it was noted he may have a stenosis of his fistula- dialysis was not completed -  Nephrology has consulted IR for a shuntogram- he was noted to have a stenosis which IR will corrected - nephrology dialyzed patient to test access.  Patient had hypotension and some adjustments were made.  - no new problems on repeat dialysis.     Hypokalemia - replaced  And resolved.    Hypertension - Norvasc, Imdur, Metoprolol, PRN Hydralazine    Hyperlipidemia - LDL 107- cont atorvastatin    GERD (gastroesophageal reflux disease) - stable   AOCD - follow - Hb 9.8 today  Procedures:  See above  Consultations:  IR Nephrology Psychiatry  Discharge Exam: Vitals:   05/21/17 0446 05/21/17 1418  BP: 128/70 101/61  Pulse: 84   Resp: 16   Temp: 98.7 F (37.1 C) 98.4 F (36.9 C)  SpO2: 100% 98%    General: Pt in nad, alert and awake Cardiovascular: rrr, no rubs Respiratory: no increased wob, no wheezes  Discharge Instructions   Discharge Instructions    Call MD for:  severe uncontrolled pain   Complete by:  As directed    Call MD for:  temperature >100.4   Complete by:  As directed    Diet - low sodium heart healthy   Complete by:  As directed    Discharge instructions   Complete by:  As directed    Please follow up with your nephrologist for routine dialysis and primary care physician after hospital discharge.   Increase activity slowly   Complete by:  As directed    Increase activity slowly   Complete by:  As directed      Allergies as of 05/21/2017      Reactions   Penicillins Anaphylaxis, Swelling, Rash   ANGIOEDEMA "SWELLING OF ENTIRE BODY" Has patient had a PCN reaction causing immediate rash, facial/tongue/throat swelling, SOB or lightheadedness with hypotension: No Has patient had a PCN reaction causing severe rash involving mucus membranes or skin necrosis: No Has patient had a PCN reaction that required hospitalization  No Has patient had a PCN reaction occurring within the last 10 years: No If all of the above answers are "NO", then may proceed with Cephalosporin use.      Medication List    TAKE these medications   amLODipine 10 MG tablet Commonly known as:  NORVASC Take 1 tablet (10 mg total) by mouth daily.   aspirin 81 MG EC tablet Take 1 tablet (81 mg total) by mouth daily. Swallow whole.   atorvastatin 80 MG tablet Commonly known as:  LIPITOR Take 1 tablet (80 mg total) by mouth daily at 6 PM.   clopidogrel 75 MG tablet Commonly known as:  PLAVIX Take 1 tablet (75 mg total) by mouth daily.   isosorbide mononitrate 30 MG 24 hr tablet Commonly known as:  IMDUR Take 1 tablet (30 mg total) by mouth daily.   latanoprost 0.005 % ophthalmic solution Commonly known as:  XALATAN Place 1 drop into both eyes at bedtime.   metoprolol tartrate 50 MG tablet Commonly known as:  LOPRESSOR Take 1 tablet (50 mg total) by mouth 2 (two) times daily with a meal.   nitroGLYCERIN 0.4 MG SL tablet Commonly known as:  NITROSTAT DISSOLVE 1 TABLET UNDER THE TONGUE AS NEEDED FOR CHEST PAIN. REPEAT AS NEEDED EVERY 5 MINUTES UP TO A TOTAL OF 3 DOSES What changed:  See the new instructions.   oxyCODONE-acetaminophen 5-325 MG tablet Commonly known as:  PERCOCET/ROXICET Take 1 tablet by mouth every 6 (six) hours as needed. What changed:  additional instructions   sertraline 50 MG tablet Commonly known as:  ZOLOFT Take 1 tablet (50 mg total) by mouth at bedtime.   SIMBRINZA 1-0.2 % Susp Generic drug:  Brinzolamide-Brimonidine Place 1 drop into both eyes 2 (two) times daily.      Allergies  Allergen Reactions  . Penicillins Anaphylaxis, Swelling and Rash    ANGIOEDEMA "SWELLING OF ENTIRE BODY"  Has patient had a PCN reaction causing immediate rash, facial/tongue/throat swelling, SOB or lightheadedness with hypotension: No Has patient had a PCN reaction causing severe rash involving mucus membranes  or skin necrosis: No Has patient had a PCN reaction that required hospitalization No Has patient had a PCN reaction occurring within the last 10 years: No If all of the above answers are "NO", then may proceed with Cephalosporin use.       The results of significant diagnostics from this hospitalization (including imaging, microbiology, ancillary and laboratory) are listed below for reference.    Significant Diagnostic Studies: Ct Angio Chest Pe W Or Wo Contrast  Result Date: 05/14/2017 CLINICAL DATA:  Chest pain and shortness of breath for the past month. Evaluate for pulmonary embolism. EXAM: CT ANGIOGRAPHY CHEST WITH CONTRAST TECHNIQUE: Multidetector CT imaging of the chest was performed using the standard protocol during bolus administration of intravenous contrast. Multiplanar CT image reconstructions and MIPs were obtained to evaluate the vascular anatomy. CONTRAST:  ISOVUE-370 IOPAMIDOL (ISOVUE-370) INJECTION 76% COMPARISON:  05/13/2017; 04/07/2017; 01/27/2017; 01/10/2016 FINDINGS: Vascular Findings:  There is adequate opacification of the pulmonary arterial system with the main pulmonary artery measuring 352 Hounsfield units. There are no discrete filling defects within the pulmonary arterial tree to suggest pulmonary embolism. Normal caliber the main pulmonary artery. Borderline cardiomegaly. No pericardial effusion. Coronary artery calcifications. Atherosclerotic plaque within a normal caliber thoracic aorta. No definite thoracic aortic annular, dissection or periaortic stranding on this nongated examination. Conventional configuration of the aortic arch. The branch vessels of the aortic arch appear patent throughout their imaged course. Review of the MIP images confirms the above findings. ---------------------------------------------------------------------------------- Nonvascular Findings: Mediastinum/Lymph Nodes: No bulky mediastinal, hilar or axillary lymphadenopathy. Lungs/Pleura:  Minimal dependent subpleural ground-glass atelectasis. No discrete focal airspace opacities. No pleural effusion or pneumothorax. Mild to moderate apical predominant mixed centrilobular and paraseptal emphysematous change. No discrete pulmonary nodules. Upper abdomen: Limited early arterial phase evaluation of the upper abdomen demonstrates a small hiatal hernia. Musculoskeletal: No acute or aggressive osseous abnormalities. 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 Stigmata of mild DISH throughout the thoracic spine. Mild diffuse body wall anasarca. Imaged portions of the thyroid gland are normal. IMPRESSION: 1. Mild-to-moderate apical predominant mixed centrilobular and paraseptal emphysematous change. Otherwise, no explanation for patient's shortness of breath. Specifically, no evidence of pulmonary embolism. 2. Borderline cardiomegaly. 3. Coronary artery calcifications. 4. Aortic Atherosclerosis (ICD10-I70.0) and Emphysema (ICD10-J43.9). Electronically Signed   By: Simonne Come M.D.   On: 05/14/2017 09:39   Ir US Guide Vasc Access Left  Result Date: 05/17/2017 INDICATION: 62 year old Richard Barnett with a history of high venous pressures of left upper extremity dialysis circuit EXAM: IR ULTRASOUND GUIDANCE VASC ACCESS LEFT; IR AV DIALY SHUNT INTRO NEEDLE/INTRACATH INITIAL W/PTA/IMG*L*; IR PTA AND STENT ADDL CENTRAL DIALY SEG THRU DIALY CIRCUIT LEFT MEDICATIONS: None. ANESTHESIA/SEDATION: Moderate Sedation Time:  35 minutes The patient was continuously monitored during the procedure by the interventional radiology nurse under my direct supervision. FLUOROSCOPY TIME:  Fluoroscopy Time: 6 minutes 30 seconds (61 mGy). COMPLICATIONS: None PROCEDURE: Informed written consent was obtained from the patient after a thorough discussion of the procedural risks, benefits and alternatives. All questions were addressed. Maximal Sterile  Barrier Technique was utilized including caps, mask, sterile gowns, sterile gloves, sterile drape, hand hygiene and skin antiseptic. A timeout was performed prior to the initiation of the procedure. Patient is positioned supine position with the left arm abducted. Left arm was prepped and draped in the usual sterile fashion. 1% lidocaine was used for local anesthesia. Ultrasound guidance was used for micropuncture access of the left upper extremity. Images were acquired. My Seldinger technique was used to place a 7 [place] sheath. Treatment of venous outflow stenosis was performed of the narrowing just beyond the graft anastomosis. 10 mm and 12 mm balloon angioplasty was performed. The 12 mm balloon was then used in an attempt to determine whether there was stenosis at the subclavian vein and at the innominate vein inflow into the superior vena cava. No narrowing was identified, with free movement of the 12 mm balloon 2 infero within the vein at each of these locations. Drug-eluting balloon was then used to treat the outflow stenosis beyond the graft material. Final venogram was performed. Z stitch was placed upon removal of the sheath. Sterile bandage was placed. Patient tolerated the procedure well and remained hemodynamically stable throughout. No complications were encountered and no significant blood loss. IMPRESSION: Status post balloon angioplasty and treatment of venous outflow stenosis at the  venous end of left upper extremity AV graft, with improved outflow. The study demonstrates no significant stenosis at the subclavian vein or the innominate vein smaller than a 12 mm diameter. Signed, Yvone Neu. Loreta Ave, DO [facility] Radiology ACCESS: This access remains amenable to future percutaneous interventions as clinically indicated. Electronically Signed   By: Gilmer Mor D.O.   On: 05/17/2017 15:27   Dg Chest Portable 1 View  Result Date: 05/13/2017 CLINICAL  DATA:  Pt here from dialysis with c/o central chest pain and dizziness. Pt did all but 30 mins of his dialysis. Denies SOB. Hx of HTN, diabetes, and CAD. Former smoker- quit 2012. EXAM: PORTABLE CHEST 1 VIEW COMPARISON:  04/07/2017 FINDINGS: Cardiac silhouette is normal in size. No mediastinal or hilar masses. No convincing adenopathy. Clear lungs.  No pleural effusion or pneumothorax. Skeletal structures are grossly intact. IMPRESSION: No active disease. Electronically Signed   By: Amie Portland M.D.   On: 05/13/2017 18:54   Ir Dialy Shunt Intro Needle/intracath Initial W/img Left  Result Date: 05/16/2017 CLINICAL DATA:  Difficult cannulation of left upper arm dialysis graft with increased venous pressures during dialysis. EXAM: DIALYSIS AV SHUNTLOGRAM COMPARISON:  None CONTRAST:  60 mL Isovue-300 FLUOROSCOPY TIME:  36 seconds.  71.4 mGy. PROCEDURE: The procedure, risks, benefits, and alternatives were explained to the patient. Questions regarding the procedure were encouraged and answered. The patient understands and consents to the procedure. A time-out was performed prior to initiating the procedure. The left upper arm AV graft was prepped with chlorhexidine in a sterile fashion, and a sterile drape was applied covering the operative field. A diagnostic shuntogram was performed via an 18 gauge angiocatheter introduced into the graft. Venous drainage was assessed to the level of the central veins. Proximal graft was studied by reflux maneuver with temporary compression of venous outflow. After the procedure, the angiocatheter was removed and hemostasis obtained with manual compression. COMPLICATIONS: None FINDINGS: The left upper arm straight graft is normally patent including the arterial anastomosis with the brachial artery. At the venous anastomosis there is minimal stenosis of roughly 20-25%. Venous outflow demonstrates significant collateral veins in the left neck with flow identified in the left  internal jugular vein and additional collaterals crossing the neck to the right side. There is a focal likely high-grade stenosis at the juncture of the subclavian vein and brachiocephalic vein. In addition, a significant central left brachiocephalic venous stenosis is suspected at the brachiocephalic venous confluence. There is an indication to perform central venous angioplasty. The patient was not NPO today and would like to receive conscious sedation for intervention. He will be made NPO after midnight and venous angioplasty scheduled for tomorrow. 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 Abundant collateral veins are identified in the neck and chest. Central venous angioplasty is indicated but will be deferred until tomorrow as the patient has requested IV conscious sedation be administered for the intervention. Electronically Signed   By: Irish Lack M.D.   On: 05/16/2017 16:23   Ir Av Dialy Shunt Intro Needle/intracath Initial W/pta/img Left  Result Date: 05/17/2017 INDICATION: 62 year old Richard Barnett with a history of high venous pressures of left upper extremity dialysis circuit EXAM: IR ULTRASOUND GUIDANCE VASC ACCESS LEFT; IR AV DIALY SHUNT INTRO NEEDLE/INTRACATH INITIAL W/PTA/IMG*L*; IR PTA AND STENT ADDL CENTRAL DIALY SEG THRU DIALY CIRCUIT LEFT MEDICATIONS: None.  ANESTHESIA/SEDATION: Moderate Sedation Time:  35 minutes The patient was continuously monitored during the procedure by the interventional radiology nurse under my direct supervision. FLUOROSCOPY TIME:  Fluoroscopy Time: 6 minutes 30 seconds (61 mGy). COMPLICATIONS: None PROCEDURE: Informed written consent was obtained from the patient after a thorough discussion of the procedural risks, benefits and alternatives. All questions were addressed. Maximal Sterile Barrier Technique was utilized  including caps, mask, sterile gowns, sterile gloves, sterile drape, hand hygiene and skin antiseptic. A timeout was performed prior to the initiation of the procedure. Patient is positioned supine position with the left arm abducted. Left arm was prepped and draped in the usual sterile fashion. 1% lidocaine was used for local anesthesia. Ultrasound guidance was used for micropuncture access of the left upper extremity. Images were acquired. My Seldinger technique was used to place a 7 [place] sheath. Treatment of venous outflow stenosis was performed of the narrowing just beyond the graft anastomosis. 10 mm and 12 mm balloon angioplasty was performed. The 12 mm balloon was then used in an attempt to determine whether there was stenosis at the subclavian vein and at the innominate vein inflow into the superior vena cava. No narrowing was identified, with free movement of the 12 mm balloon 2 infero within the vein at each of these locations. Drug-eluting balloon was then used to treat the outflow stenosis beyond the graft material. Final venogram was performed. Z stitch was placed upon removal of the sheath. Sterile bandage was placed. Patient tolerated the procedure well and remained hemodynamically stable throughout. No complications were encountered and no significant blood loss. 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 The study demonstrates no significant stenosis at the subclavian vein or the innominate vein smaller than a 12 mm diameter. Signed, Yvone Neu. Loreta Ave, DO [facility] Radiology ACCESS: This access remains amenable to future percutaneous interventions as clinically indicated. Electronically Signed   By: Gilmer Mor D.O.   On: 05/17/2017 15:27   Ir Pta Addl Central Dialysis Seg Thru Dialy Circuit Left  Result Date:  05/17/2017 INDICATION: 62 year old Richard Barnett with a history of high venous pressures of left upper extremity dialysis circuit EXAM: IR ULTRASOUND GUIDANCE VASC ACCESS LEFT; IR AV DIALY SHUNT INTRO NEEDLE/INTRACATH INITIAL W/PTA/IMG*L*; IR PTA AND STENT ADDL CENTRAL DIALY SEG THRU DIALY CIRCUIT LEFT MEDICATIONS: None. ANESTHESIA/SEDATION: Moderate Sedation Time:  35 minutes The patient was continuously monitored during the procedure by the interventional radiology nurse under my direct supervision. FLUOROSCOPY TIME:  Fluoroscopy Time: 6 minutes 30 seconds (61 mGy). COMPLICATIONS: None PROCEDURE: Informed written consent was obtained from the patient after a thorough discussion of the procedural risks, benefits and alternatives. All questions were addressed. Maximal Sterile Barrier Technique was utilized including caps, mask, sterile gowns, sterile gloves, sterile drape, hand hygiene and skin antiseptic. A timeout was performed prior to the initiation of the procedure. Patient is positioned supine position with the left arm abducted. Left arm was prepped and draped in the usual sterile fashion. 1% lidocaine was used for local anesthesia. Ultrasound guidance was used for micropuncture access of the left upper extremity. Images were acquired. My Seldinger technique was used to place a 7 [place] sheath. Treatment of venous outflow stenosis was performed of the narrowing just beyond the graft anastomosis. 10 mm and 12 mm balloon angioplasty was performed. The 12 mm balloon was then used in an attempt to determine  whether there was stenosis at the subclavian vein and at the innominate vein inflow into the superior vena cava. No narrowing was identified, with free movement of the 12 mm balloon 2 infero within the vein at each of these locations. Drug-eluting balloon was then used to treat the outflow stenosis beyond the graft material. Final venogram was performed. Z stitch was placed upon removal of the sheath. Sterile bandage  was placed. Patient tolerated the procedure well and remained hemodynamically stable throughout. No complications were encountered and no significant blood loss. 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 The study demonstrates no significant stenosis at the subclavian vein or the innominate vein smaller than a 12 mm diameter. Signed, Yvone Neu. Loreta Ave, DO [facility] Radiology ACCESS: This access remains amenable to future percutaneous interventions as clinically indicated. Electronically Signed   By: Gilmer Mor D.O.   On: 05/17/2017 15:27    Microbiology: No results found for this or any previous visit (from the past 240 hour(s)).   Labs: Basic Metabolic Panel: Recent Labs  Lab 05/15/17 0529 05/16/17 0738 05/18/17 0703 05/20/17 0941  NA 137 138 137 137  K 3.4* 3.6 4.0 3.7  CL 99* 100* 103 101  CO2 26 26 24 24   GLUCOSE 112* 117* 129* 139*  BUN 31* 36* 35* 25*  CREATININE 6.32* 6.90* 6.60* 6.80*  CALCIUM 8.0* 8.7* 8.3* 9.0  PHOS  --  4.4 3.5 3.3   Liver Function Tests: Recent Labs  Lab 05/16/17 0738 05/18/17 0703 05/20/17 0941  ALBUMIN 3.6 3.2* 3.7   No results for input(s): LIPASE, AMYLASE in the last 168 hours. No results for input(s): AMMONIA in the last 168 hours. CBC: Recent Labs  Lab 05/17/17 0540 05/18/17 0416 05/19/17 0618 05/20/17 0941 05/21/17 0346  WBC 8.6 9.6 10.8* 8.8 9.5  HGB 8.9* 9.5* 9.4* 9.4* 9.5*  HCT 28.1* 29.5* 29.1* 29.2* 30.2*  MCV 89.5 89.9 91.2 90.7 91.8  PLT 310 330 304 342 272   Cardiac Enzymes: No results for input(s): CKTOTAL, CKMB, CKMBINDEX, TROPONINI in the last 168 hours. BNP: BNP (last 3 results) Recent Labs    08/10/16 0846 05/13/17 1845  BNP 25.2 28.1    ProBNP (last 3 results) No results for input(s): PROBNP in the last 8760 hours.  CBG: No results for  input(s): GLUCAP in the last 168 hours.     Signed:  Penny Pia MD.  [facility] 05/21/2017, 3:49 PM

## 2017-05-21 NOTE — Progress Notes (Signed)
Pt refused to be assesed. Refused Heparin SQ also.

## 2017-05-21 NOTE — Progress Notes (Signed)
  [facility] Progress Note   Subjective: Says his arm is sore. Still on suicide precautions although has been cleared by psychiatrist. Wants to go home.  Objective Vitals:   05/20/17 1332 05/20/17 1419 05/20/17 2045 05/21/17 0446  BP: (!) 168/99 135/84 109/70 128/70  Pulse: 80 93 80 84  Resp:   16 16  Temp:  98.4 F (36.9 C) 98.6 F (37 C) 98.7 F (37.1 C)  TempSrc:  Oral Oral Oral  SpO2:  100% 100% 100%  Weight:    98.2 kg (216 lb 9.6 oz)  Height:       Physical Exam General: WNWD NAD Heart: S1,S2, RRR Lungs: CTAB A/P Abdomen: Active BS Extremities: No LE edema Dialysis Access: LUA AVG swelling upper portion of AVG + bruit   Additional Objective Labs: Basic Metabolic Panel: Recent Labs  Lab 05/16/17 0738 05/18/17 0703 05/20/17 0941  NA 138 137 137  K 3.6 4.0 3.7  CL 100* 103 101  CO2 26 24 24   GLUCOSE 117* 129* 139*  BUN 36* 35* 25*  CREATININE 6.90* 6.60* 6.80*  CALCIUM 8.7* 8.3* 9.0  PHOS 4.4 3.5 3.3   Liver Function Tests: Recent Labs  Lab 05/16/17 0738 05/18/17 0703 05/20/17 0941  ALBUMIN 3.6 3.2* 3.7   No results for input(s): LIPASE, AMYLASE in the last 168 hours. CBC: Recent Labs  Lab 05/17/17 0540 05/18/17 0416 05/19/17 0618 05/20/17 0941 05/21/17 0346  WBC 8.6 9.6 10.8* 8.8 9.5  HGB 8.9* 9.5* 9.4* 9.4* 9.5*  HCT 28.1* 29.5* 29.1* 29.2* 30.2*  MCV 89.5 89.9 91.2 90.7 91.8  PLT 310 330 304 342 272   Blood Culture No results found for: SDES, SPECREQUEST, CULT, REPTSTATUS  Cardiac Enzymes: No results for input(s): CKTOTAL, CKMB, CKMBINDEX, TROPONINI in the last 168 hours. CBG: No results for input(s): GLUCAP in the last 168 hours. Iron Studies: No results for input(s): IRON, TIBC, TRANSFERRIN, FERRITIN in the last 72 hours. @lablastinr3 @ Studies/Results: No results found. Medications:  . amLODipine  10 mg Oral QHS  . aspirin EC  81 mg Oral Daily  . atorvastatin  80 mg Oral q1800  . clopidogrel  75 mg Oral  Daily  . darbepoetin (ARANESP) injection - DIALYSIS  60 mcg Intravenous Q Mon-HD  . heparin injection (subcutaneous)  5,000 Units Subcutaneous Q8H  . isosorbide mononitrate  30 mg Oral Daily  . latanoprost  1 drop Both Eyes QHS  . metoprolol tartrate  50 mg Oral BID WC  . multivitamin  1 tablet Oral QHS  . pantoprazole  40 mg Oral Daily  . sertraline  50 mg Oral QHS     Dialysis Orders:  [facility] MWF 4hrs, 400/800, 2K/2Ca, EDW 101.5kg, LU AVG Calcitriol 0.101mcg qHD Venofer 100mg  IV qHD - 8/10 completed No Heparin  Assessment/Plan: 1.Chest pain-resolved - neg work up 2. ESRD- MWF -[facility].  3. Anemia- hgb9.5Hgb trending down from 11 started Aranesp 60 3/11. Finish Fe load. Had blood loss after infiltration of AVF 03/15. Follow HGB. .  4. Secondary hyperparathyroidism- Ca 9.0 cont VDRA. Phos 3.3-no binders yet 5.HTN/volume- EDW +/- 101 prior to admission - orthostatic post HD Wed and below edw; BP up some this am - sats good on room air; plan check orthostatic pre HD Friday and decide on goal base on pre weights/pre orthostatics 6. Nutrition- Albumin 3.7 renal diet, renal vit nepro 7.Suicidal ideation -Pt cleared by psychiatry. No further W/U. To follow with PCP for antidepressants. 8. 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 Had venous infiltration in HD 05/20/17. Very tender, swelling noted upper portion of AVG but strong bruit throughout AVG.     H.  NP-C 05/21/2017, 8:37 AM  [employer] [phone]

## 2017-06-24 ENCOUNTER — Other Ambulatory Visit: Payer: Self-pay

## 2017-06-24 ENCOUNTER — Other Ambulatory Visit (HOSPITAL_COMMUNITY): Payer: Self-pay

## 2017-06-24 ENCOUNTER — Emergency Department (HOSPITAL_COMMUNITY): Payer: Medicaid Other

## 2017-06-24 ENCOUNTER — Inpatient Hospital Stay (HOSPITAL_COMMUNITY)
Admission: EM | Admit: 2017-06-24 | Discharge: 2017-07-04 | DRG: 245 | Disposition: A | Discharge status: Skilled Nursing Facility | Payer: Medicaid Other | Attending: Internal Medicine | Admitting: Internal Medicine

## 2017-06-24 ENCOUNTER — Encounter (HOSPITAL_COMMUNITY): Payer: Self-pay | Admitting: Emergency Medicine

## 2017-06-24 DIAGNOSIS — E782 Mixed hyperlipidemia: Secondary | ICD-10-CM | POA: Diagnosis present

## 2017-06-24 DIAGNOSIS — I462 Cardiac arrest due to underlying cardiac condition: Secondary | ICD-10-CM | POA: Diagnosis present

## 2017-06-24 DIAGNOSIS — N186 End stage renal disease: Secondary | ICD-10-CM

## 2017-06-24 DIAGNOSIS — I219 Acute myocardial infarction, unspecified: Secondary | ICD-10-CM | POA: Diagnosis present

## 2017-06-24 DIAGNOSIS — Z86718 Personal history of other venous thrombosis and embolism: Secondary | ICD-10-CM | POA: Diagnosis not present

## 2017-06-24 DIAGNOSIS — E1122 Type 2 diabetes mellitus with diabetic chronic kidney disease: Secondary | ICD-10-CM | POA: Diagnosis present

## 2017-06-24 DIAGNOSIS — I132 Hypertensive heart and chronic kidney disease with heart failure and with stage 5 chronic kidney disease, or end stage renal disease: Secondary | ICD-10-CM | POA: Diagnosis present

## 2017-06-24 DIAGNOSIS — R402213 Coma scale, best verbal response, none, at hospital admission: Secondary | ICD-10-CM | POA: Diagnosis present

## 2017-06-24 DIAGNOSIS — I361 Nonrheumatic tricuspid (valve) insufficiency: Secondary | ICD-10-CM | POA: Diagnosis not present

## 2017-06-24 DIAGNOSIS — Z955 Presence of coronary angioplasty implant and graft: Secondary | ICD-10-CM

## 2017-06-24 DIAGNOSIS — Z95810 Presence of automatic (implantable) cardiac defibrillator: Secondary | ICD-10-CM

## 2017-06-24 DIAGNOSIS — I214 Non-ST elevation (NSTEMI) myocardial infarction: Secondary | ICD-10-CM | POA: Diagnosis present

## 2017-06-24 DIAGNOSIS — I5032 Chronic diastolic (congestive) heart failure: Secondary | ICD-10-CM | POA: Diagnosis present

## 2017-06-24 DIAGNOSIS — I469 Cardiac arrest, cause unspecified: Secondary | ICD-10-CM | POA: Diagnosis present

## 2017-06-24 DIAGNOSIS — R000 Tachycardia, unspecified: Secondary | ICD-10-CM | POA: Diagnosis present

## 2017-06-24 DIAGNOSIS — N2581 Secondary hyperparathyroidism of renal origin: Secondary | ICD-10-CM | POA: Diagnosis present

## 2017-06-24 DIAGNOSIS — Z992 Dependence on renal dialysis: Secondary | ICD-10-CM | POA: Diagnosis not present

## 2017-06-24 DIAGNOSIS — I25119 Atherosclerotic heart disease of native coronary artery with unspecified angina pectoris: Secondary | ICD-10-CM | POA: Diagnosis present

## 2017-06-24 DIAGNOSIS — G9340 Encephalopathy, unspecified: Secondary | ICD-10-CM | POA: Diagnosis not present

## 2017-06-24 DIAGNOSIS — I4901 Ventricular fibrillation: Secondary | ICD-10-CM | POA: Diagnosis present

## 2017-06-24 DIAGNOSIS — E876 Hypokalemia: Secondary | ICD-10-CM | POA: Diagnosis not present

## 2017-06-24 DIAGNOSIS — R402143 Coma scale, eyes open, spontaneous, at hospital admission: Secondary | ICD-10-CM | POA: Diagnosis present

## 2017-06-24 DIAGNOSIS — Z87891 Personal history of nicotine dependence: Secondary | ICD-10-CM

## 2017-06-24 DIAGNOSIS — R402343 Coma scale, best motor response, flexion withdrawal, at hospital admission: Secondary | ICD-10-CM | POA: Diagnosis present

## 2017-06-24 DIAGNOSIS — R45851 Suicidal ideations: Secondary | ICD-10-CM

## 2017-06-24 DIAGNOSIS — I252 Old myocardial infarction: Secondary | ICD-10-CM

## 2017-06-24 DIAGNOSIS — E872 Acidosis: Secondary | ICD-10-CM | POA: Diagnosis present

## 2017-06-24 DIAGNOSIS — K219 Gastro-esophageal reflux disease without esophagitis: Secondary | ICD-10-CM | POA: Diagnosis present

## 2017-06-24 DIAGNOSIS — R782 Finding of cocaine in blood: Secondary | ICD-10-CM | POA: Diagnosis present

## 2017-06-24 DIAGNOSIS — Z79899 Other long term (current) drug therapy: Secondary | ICD-10-CM

## 2017-06-24 DIAGNOSIS — F329 Major depressive disorder, single episode, unspecified: Secondary | ICD-10-CM | POA: Diagnosis present

## 2017-06-24 DIAGNOSIS — S00532A Contusion of oral cavity, initial encounter: Secondary | ICD-10-CM | POA: Diagnosis present

## 2017-06-24 DIAGNOSIS — G9341 Metabolic encephalopathy: Secondary | ICD-10-CM | POA: Diagnosis present

## 2017-06-24 DIAGNOSIS — Z7902 Long term (current) use of antithrombotics/antiplatelets: Secondary | ICD-10-CM

## 2017-06-24 DIAGNOSIS — N184 Chronic kidney disease, stage 4 (severe): Secondary | ICD-10-CM | POA: Diagnosis present

## 2017-06-24 DIAGNOSIS — D631 Anemia in chronic kidney disease: Secondary | ICD-10-CM | POA: Diagnosis present

## 2017-06-24 DIAGNOSIS — I272 Pulmonary hypertension, unspecified: Secondary | ICD-10-CM | POA: Diagnosis present

## 2017-06-24 DIAGNOSIS — Z6833 Body mass index (BMI) 33.0-33.9, adult: Secondary | ICD-10-CM

## 2017-06-24 DIAGNOSIS — E669 Obesity, unspecified: Secondary | ICD-10-CM | POA: Diagnosis present

## 2017-06-24 DIAGNOSIS — Z880 Allergy status to penicillin: Secondary | ICD-10-CM

## 2017-06-24 DIAGNOSIS — Z7982 Long term (current) use of aspirin: Secondary | ICD-10-CM

## 2017-06-24 DIAGNOSIS — I2510 Atherosclerotic heart disease of native coronary artery without angina pectoris: Secondary | ICD-10-CM | POA: Diagnosis not present

## 2017-06-24 LAB — I-STAT CHEM 8, ED
BUN: 12 mg/dL (ref 6–20)
Calcium, Ion: 0.89 mmol/L — CL (ref 1.15–1.40)
Chloride: 92 mmol/L — ABNORMAL LOW (ref 101–111)
Creatinine, Ser: 5.2 mg/dL — ABNORMAL HIGH (ref 0.61–1.24)
Glucose, Bld: 237 mg/dL — ABNORMAL HIGH (ref 65–99)
HEMATOCRIT: 38 % — AB (ref 39.0–52.0)
HEMOGLOBIN: 12.9 g/dL — AB (ref 13.0–17.0)
POTASSIUM: 3.2 mmol/L — AB (ref 3.5–5.1)
SODIUM: 136 mmol/L (ref 135–145)
TCO2: 24 mmol/L (ref 22–32)

## 2017-06-24 LAB — I-STAT CG4 LACTIC ACID, ED
Lactic Acid, Venous: 12.66 mmol/L (ref 0.5–1.9)
Lactic Acid, Venous: 6.1 mmol/L (ref 0.5–1.9)

## 2017-06-24 LAB — COMPREHENSIVE METABOLIC PANEL
ALBUMIN: 3.8 g/dL (ref 3.5–5.0)
ALT: 65 U/L — ABNORMAL HIGH (ref 17–63)
ANION GAP: 24 — AB (ref 5–15)
AST: 92 U/L — ABNORMAL HIGH (ref 15–41)
Alkaline Phosphatase: 80 U/L (ref 38–126)
BUN: 10 mg/dL (ref 6–20)
CO2: 19 mmol/L — ABNORMAL LOW (ref 22–32)
Calcium: 8.2 mg/dL — ABNORMAL LOW (ref 8.9–10.3)
Chloride: 94 mmol/L — ABNORMAL LOW (ref 101–111)
Creatinine, Ser: 5.43 mg/dL — ABNORMAL HIGH (ref 0.61–1.24)
GFR, EST AFRICAN AMERICAN: 12 mL/min — AB (ref >60)
GFR, EST NON AFRICAN AMERICAN: 10 mL/min — AB (ref >60)
GLUCOSE: 233 mg/dL — AB (ref 65–99)
POTASSIUM: 3.1 mmol/L — AB (ref 3.5–5.1)
Sodium: 137 mmol/L (ref 135–145)
TOTAL PROTEIN: 7.6 g/dL (ref 6.5–8.1)
Total Bilirubin: 0.9 mg/dL (ref 0.3–1.2)

## 2017-06-24 LAB — I-STAT VENOUS BLOOD GAS, ED
Acid-base deficit: 2 mmol/L (ref 0.0–2.0)
BICARBONATE: 23.5 mmol/L (ref 20.0–28.0)
O2 SAT: 94 %
TCO2: 25 mmol/L (ref 22–32)
pCO2, Ven: 42.6 mmHg — ABNORMAL LOW (ref 44.0–60.0)
pH, Ven: 7.349 (ref 7.250–7.430)
pO2, Ven: 76 mmHg — ABNORMAL HIGH (ref 32.0–45.0)

## 2017-06-24 LAB — APTT: aPTT: 29 seconds (ref 24–36)

## 2017-06-24 LAB — CBC
HEMATOCRIT: 35.3 % — AB (ref 39.0–52.0)
Hemoglobin: 10.7 g/dL — ABNORMAL LOW (ref 13.0–17.0)
MCH: 29.1 pg (ref 26.0–34.0)
MCHC: 30.3 g/dL (ref 30.0–36.0)
MCV: 95.9 fL (ref 78.0–100.0)
Platelets: 392 10*3/uL (ref 150–400)
RBC: 3.68 MIL/uL — ABNORMAL LOW (ref 4.22–5.81)
RDW: 15.8 % — AB (ref 11.5–15.5)
WBC: 11 10*3/uL — ABNORMAL HIGH (ref 4.0–10.5)

## 2017-06-24 LAB — PROTIME-INR
INR: 1.02
PROTHROMBIN TIME: 13.3 s (ref 11.4–15.2)

## 2017-06-24 LAB — I-STAT TROPONIN, ED
Troponin i, poc: 0.03 ng/mL (ref 0.00–0.08)
Troponin i, poc: 0.75 ng/mL (ref 0.00–0.08)

## 2017-06-24 LAB — PROCALCITONIN: PROCALCITONIN: 0.21 ng/mL

## 2017-06-24 LAB — TROPONIN I: Troponin I: 0.64 ng/mL (ref <0.03)

## 2017-06-24 LAB — MAGNESIUM: MAGNESIUM: 2.2 mg/dL (ref 1.7–2.4)

## 2017-06-24 MED ORDER — ASPIRIN 81 MG PO CHEW
324.0000 mg | CHEWABLE_TABLET | Freq: Once | ORAL | Status: AC
  Administered 2017-06-24: 324 mg via ORAL
  Filled 2017-06-24: qty 4

## 2017-06-24 MED ORDER — POTASSIUM CHLORIDE 10 MEQ/100ML IV SOLN
10.0000 meq | INTRAVENOUS | Status: AC
  Administered 2017-06-24 (×2): 10 meq via INTRAVENOUS
  Filled 2017-06-24 (×2): qty 100

## 2017-06-24 MED ORDER — CALCIUM GLUCONATE 10 % IV SOLN
1.0000 g | Freq: Once | INTRAVENOUS | Status: AC
  Administered 2017-06-24: 1 g via INTRAVENOUS
  Filled 2017-06-24: qty 10

## 2017-06-24 MED ORDER — LIDOCAINE 5 % EX PTCH
1.0000 | MEDICATED_PATCH | CUTANEOUS | Status: DC
  Administered 2017-06-24 – 2017-07-02 (×9): 1 via TRANSDERMAL
  Filled 2017-06-24 (×10): qty 1

## 2017-06-24 MED ORDER — ACETAMINOPHEN 500 MG PO TABS
1000.0000 mg | ORAL_TABLET | Freq: Once | ORAL | Status: AC
  Administered 2017-06-24: 1000 mg via ORAL
  Filled 2017-06-24: qty 2

## 2017-06-24 MED ORDER — FAMOTIDINE 20 MG PO TABS
20.0000 mg | ORAL_TABLET | Freq: Every day | ORAL | Status: DC
  Administered 2017-06-25 – 2017-07-04 (×9): 20 mg via ORAL
  Filled 2017-06-24 (×9): qty 1

## 2017-06-24 MED ORDER — ASPIRIN EC 81 MG PO TBEC
81.0000 mg | DELAYED_RELEASE_TABLET | Freq: Every day | ORAL | Status: DC
  Administered 2017-06-25 – 2017-07-04 (×10): 81 mg via ORAL
  Filled 2017-06-24 (×10): qty 1

## 2017-06-24 MED ORDER — SODIUM CHLORIDE 0.9 % IV SOLN
250.0000 mL | INTRAVENOUS | Status: DC | PRN
  Administered 2017-06-29: 08:00:00 via INTRAVENOUS

## 2017-06-24 MED ORDER — ATORVASTATIN CALCIUM 80 MG PO TABS
80.0000 mg | ORAL_TABLET | Freq: Every day | ORAL | Status: DC
  Administered 2017-06-25 – 2017-07-03 (×9): 80 mg via ORAL
  Filled 2017-06-24 (×10): qty 1

## 2017-06-24 MED ORDER — HEPARIN BOLUS VIA INFUSION
4000.0000 [IU] | Freq: Once | INTRAVENOUS | Status: AC
  Administered 2017-06-24: 4000 [IU] via INTRAVENOUS
  Filled 2017-06-24: qty 4000

## 2017-06-24 MED ORDER — CLOPIDOGREL BISULFATE 75 MG PO TABS
75.0000 mg | ORAL_TABLET | Freq: Every day | ORAL | Status: DC
Start: 2017-06-25 — End: 2017-07-04
  Administered 2017-06-25 – 2017-07-04 (×10): 75 mg via ORAL
  Filled 2017-06-24 (×10): qty 1

## 2017-06-24 MED ORDER — KETOROLAC TROMETHAMINE 15 MG/ML IJ SOLN
15.0000 mg | Freq: Four times a day (QID) | INTRAMUSCULAR | Status: AC | PRN
  Administered 2017-06-24 – 2017-06-27 (×9): 15 mg via INTRAVENOUS
  Filled 2017-06-24 (×10): qty 1

## 2017-06-24 MED ORDER — HEPARIN (PORCINE) IN NACL 100-0.45 UNIT/ML-% IJ SOLN
1350.0000 [IU]/h | INTRAMUSCULAR | Status: DC
  Administered 2017-06-24: 1200 [IU]/h via INTRAVENOUS
  Administered 2017-06-25: 1400 [IU]/h via INTRAVENOUS
  Administered 2017-06-26 – 2017-06-27 (×2): 1250 [IU]/h via INTRAVENOUS
  Administered 2017-06-28: 1350 [IU]/h via INTRAVENOUS
  Filled 2017-06-24 (×5): qty 250

## 2017-06-24 MED ORDER — INSULIN ASPART 100 UNIT/ML ~~LOC~~ SOLN
0.0000 [IU] | SUBCUTANEOUS | Status: DC
  Administered 2017-06-25: 3 [IU] via SUBCUTANEOUS
  Administered 2017-06-25 – 2017-06-26 (×4): 2 [IU] via SUBCUTANEOUS

## 2017-06-24 NOTE — Consult Note (Signed)
Cardiology Consultation:   Patient ID: Richard Barnett; [id number]; Feb 15, 1956   Admit date: 06/24/2017 Date of Consult: 06/24/2017  Primary Care Provider: Patient, No Pcp Per Primary Cardiologist: No primary care provider on file.   [facility] at [facility] Primary Electrophysiologist:  None   Patient Profile:   Richard Barnett is a 62 y.o. [place] with a hx of CAD and ESRD. who is being seen today for the evaluation of cardiac arrest at the request of Dr. Fayrene Fearing.  History of Present Illness:   Richard Barnett has a history of CAD with 2 stents placed in [place] by report.  He has had multiple stress tests since.  Last seen by [place] in March and was to follow in [place].  I don't see any records in Care Everywhere from [place].  The last cath here was in Nov.   In 11/18 with positive troponins and underwent cardiac cath with Dr. Swaziland noting 2 vessel CAD with 75% small diag lesion and 90% first OM with ISR. It was felt that intervention to this area would not be beneficial as it was a small vessel and previously stented. He was placed on plavix during that admission and instructed to continue on DAPT.  In March his pain was thought to be non anginal.  Of note in Nov of last year he had a normal ejection fraction.   He was transported from dialysis after becoming unresponsive and apparently pulseless.  He is reported to have had 10 minutes of chest compressions and when the AED he was shocked x 3.  However, we cannot get any strips from that event.  When EMS arrived he had sinus tach and a pulse.  He did not require intubation.  When I arrived in the ED he was awake but confused and lying on his stomach as he had positioned himself that way.  We could not get him to turn over or answer questions or cooperate with the exam.  He is confused.    (Note all of the history/PMH/social/FH  is obtained from the ED staff and the chart as the patient was not able to answer questions.)   Past Medical History:  Diagnosis Date    . Arthritis    "right leg" (08/22/2014)  . CAD (coronary artery disease)    a. CAD s/p 2 stent placement in [place] 2016. b. Neg nuc 05/2014 & 01/2016.  Marland Kitchen Chest pain 01/2017  . CKD (chronic kidney disease), stage IV (HCC)    [name] 08/22/2014  . Daily headache   . Depression    "I always get that" (08/22/2014)  . Diabetes mellitus (HCC)   . DVT (deep venous thrombosis) (HCC) ?   RLE  . GERD (gastroesophageal reflux disease)   . Heart murmur   . Hypercholesterolemia   . Hypertension   . Myocardial infarction (HCC)   . Wears glasses     Past Surgical History:  Procedure Laterality Date  . AV FISTULA PLACEMENT Left 10/01/2016   Procedure: INSERTION OF 4-7MM X 45CM ARTERIOVENOUS (AV) GORE-TEX GRAFT ARM;  Surgeon: Chuck Hint, MD;  Location: [facility];  Service: Vascular;  Laterality: Left;  . CORONARY ANGIOPLASTY WITH STENT PLACEMENT     "1 + 1"  . DRAINAGE AND CLOSURE OF LYMPHOCELE Left 10/29/2016   Procedure: EVACUATION OF LYMPHOCELE LEFT ARM ARTERIOVENOUS GRAFT;  Surgeon: Chuck Hint, MD;  Location: [facility] OR;  Service: Vascular;  Laterality: Left;  . IR AV DIALY SHUNT INTRO NEEDLE/INTRACATH INITIAL  W/PTA/IMG LEFT  05/17/2017  . IR DIALY SHUNT INTRO NEEDLE/INTRACATH INITIAL W/IMG LEFT Left 05/16/2017  . IR PTA ADDL CENTRAL DIALYSIS SEG THRU DIALY CIRCUIT LEFT Left 05/17/2017  . IR US GUIDE VASC ACCESS LEFT  05/17/2017  . LEFT HEART CATH AND CORONARY ANGIOGRAPHY N/A 01/14/2017   Procedure: LEFT HEART CATH AND CORONARY ANGIOGRAPHY;  Surgeon: Swaziland, Peter M, MD;  Location: [facility] INVASIVE CV LAB;  Service: Cardiovascular;  Laterality: N/A;     Prior to Admission medications   Medication Sig Start Date End Date Taking? Authorizing Provider  amLODipine (NORVASC) 10 MG tablet Take 1 tablet (10 mg total) by mouth daily. 01/30/17 06/24/17 Yes Chundi, Sherlyn Lees, MD  atorvastatin (LIPITOR) 80 MG tablet Take 1 tablet (80 mg total) by mouth daily at 6 PM. 01/29/17  Yes Chundi, Vahini, MD   Brinzolamide-Brimonidine [facility]) 1-0.2 % SUSP Place 1 drop into both eyes 2 (two) times daily.   Yes [provider]  clopidogrel (PLAVIX) 75 MG tablet Take 1 tablet (75 mg total) by mouth daily. 01/30/17  Yes Chundi, Sherlyn Lees, MD  isosorbide mononitrate (IMDUR) 30 MG 24 hr tablet Take 1 tablet (30 mg total) by mouth daily. 01/29/17 06/24/17 Yes Chundi, Vahini, MD  latanoprost (XALATAN) 0.005 % ophthalmic solution Place 1 drop into both eyes daily.   Yes [provider]  metoprolol tartrate (LOPRESSOR) 50 MG tablet Take 1 tablet (50 mg total) by mouth 2 (two) times daily with a meal. 01/29/17 06/24/17 Yes Chundi, Vahini, MD  nitroGLYCERIN (NITROSTAT) 0.4 MG SL tablet DISSOLVE 1 TABLET UNDER THE TONGUE AS NEEDED FOR CHEST PAIN. REPEAT AS NEEDED EVERY 5 MINUTES UP TO A TOTAL OF 3 DOSES Patient taking differently: DISSOLVE 1 TABLET (0.4 MG) UNDER THE TONGUE AS NEEDED FOR CHEST PAIN/REPEAT AS NEEDED EVERY 5 MINUTES UP TO A TOTAL OF 3 DOSES 12/15/15  Yes Henrietta Hoover, NP  sertraline (ZOLOFT) 50 MG tablet Take 1 tablet (50 mg total) by mouth at bedtime. 05/21/17  Yes Penny Pia, MD  aspirin 81 MG EC tablet Take 1 tablet (81 mg total) by mouth daily. Swallow whole. Patient not taking: Reported on 06/24/2017 02/10/16   Massie Maroon, FNP  oxyCODONE-acetaminophen (PERCOCET/ROXICET) 5-325 MG tablet Take 1 tablet by mouth every 6 (six) hours as needed. 10/29/16   Lars Mage, PA-C  RENAGEL 800 MG tablet Take 1,600 mg by mouth 3 (three) times daily with meals. 06/23/17   [provider]     Inpatient Medications: Scheduled Meds: . calcium gluconate  1 g Intravenous Once   Continuous Infusions: . potassium chloride     PRN Meds:   Allergies:    Allergies  Allergen Reactions  . Penicillins Anaphylaxis, Swelling and Rash    ANGIOEDEMA "SWELLING OF ENTIRE BODY"  Has patient had a PCN reaction causing immediate rash, facial/tongue/throat swelling, SOB or  lightheadedness with hypotension: No Has patient had a PCN reaction causing severe rash involving mucus membranes or skin necrosis: No Has patient had a PCN reaction that required hospitalization No Has patient had a PCN reaction occurring within the last 10 years: No If all of the above answers are "NO", then may proceed with Cephalosporin use.     Social History:   Social History   Socioeconomic History  . Marital status: Divorced    Spouse name: Not on file  . Number of children: Not on file  . Years of education: Not on file  . Highest education level: Not on file  Occupational  History  . Not on file  Social Needs  . Financial resource strain: Not on file  . Food insecurity:    Worry: Not on file    Inability: Not on file  . Transportation needs:    Medical: Not on file    Non-medical: Not on file  Tobacco Use  . Smoking status: Former Smoker    Packs/day: 0.00    Years: 40.00    Pack years: 0.00    Types: Cigarettes    Last attempt to quit: 08/29/2010    Years since quitting: 6.8  . Smokeless tobacco: Never Used  . Tobacco comment: "quit smoking cigarettes in  2011" 10/27/16  Substance and Sexual Activity  . Alcohol use: No    Comment: 10/27/16 "stopped in ~ 2011"  . Drug use: No    Types: "Crack" cocaine, Other-see comments    Comment: 10/27/16 "stopped in ~ 2011; all types of drugs""  . Sexual activity: Never  Lifestyle  . Physical activity:    Days per week: Not on file    Minutes per session: Not on file  . Stress: Not on file  Relationships  . Social connections:    Talks on phone: Not on file    Gets together: Not on file    Attends religious service: Not on file    Active member of club or organization: Not on file    Attends meetings of clubs or organizations: Not on file    Relationship status: Not on file  . Intimate partner violence:    Fear of current or ex partner: Not on file    Emotionally abused: Not on file    Physically abused: Not on  file    Forced sexual activity: Not on file  Other Topics Concern  . Not on file  Social History Narrative  . Not on file    Family History:    Family History  Problem Relation Age of Onset  . Hypertension Mother   . Kidney disease Mother   . Heart disease Mother   . Heart disease Father   . Hypertension Father      ROS:  Unable to obtain secondary to AMS  Physical Exam/Data:   Vitals:   06/24/17 1728 06/24/17 1729 06/24/17 1730 06/24/17 1731  BP:    107/89  Pulse: (!) 102 (!) 103 98 (!) 101  Resp: (!) 22 (!) 21 17 (!) 23  SpO2: 93% 92% 94%   Weight:    216 lb (98 kg)   No intake or output data in the 24 hours ending 06/24/17 1830 Filed Weights   06/24/17 1731  Weight: 216 lb (98 kg)   Body mass index is 33.83 kg/m.   (The patient is examined while lying on his stomach and side) GENERAL:  No distress, confused appearing HEENT:   Pupils equal round and reactive, fundi not visualized, oral mucosa unremarkable NECK:  Unable to assess jugular venous distention, waveform within normal limits, carotid upstroke brisk and symmetric, no bruits, no thyromegaly LYMPHATICS:  No cervical, inguinal adenopathy LUNGS:   Clear to auscultation bilaterally BACK:  No CVA tenderness CHEST:    Unremarkable HEART:  PMI not displaced or sustained,S1 and S2 within normal limits, no S3, no S4, no clicks, no rubs, no murmurs ABD:  Flat, positive bowel sounds normal in frequency in pitch, no bruits, no rebound, no guarding, no midline pulsatile mass, no hepatomegaly, no splenomegaly EXT:  2 plus pulses throughout, no  edema, no cyanosis  no clubbing SKIN:  No rashes no nodules NEURO:   Moving all extremities.  PSYCH:   Not oriented.   EKG:  The EKG was personally reviewed and demonstrates:  Sinus tachycardia, rate 102, axis WNL, intervals WNL, no acute ST T wave changes.   Telemetry:  Telemetry was personally reviewed and demonstrates:  NA  Relevant CV Studies:   CATH 01/14/17 Conclusion      Ost LAD lesion is 30% stenosed.  Prox LAD lesion is 30% stenosed.  Mid LAD lesion is 30% stenosed.  1st Mrg lesion is 90% stenosed.  Ost 1st Diag lesion is 75% stenosed.  Prox RCA to Mid RCA lesion is 10% stenosed.  LV end diastolic pressure is moderately elevated.   1. Left dominant circulation 2. 2 vessel obstructive CAD    - 75% small diagonal branch    - 90% first OM - this is a small branch that bifurcates in the mid vessel. There is in stent restenosis. The vessel is small < 2.25 mm and disease extends past a bifurcation 3. Moderately elevated LVEDP  Plan: I only visualize one stent in the OM.  I would recommend continued medical therapy. In my opinion he would not benefit from repeat intervention of OM since this is a very small branch and previously stented.  Other major vessels are without significant disease.      Laboratory Data:  Chemistry Recent Labs  Lab 06/24/17 1737  NA 136  K 3.2*  CL 92*  GLUCOSE 237*  BUN 12  CREATININE 5.20*    No results for input(s): PROT, ALBUMIN, AST, ALT, ALKPHOS, BILITOT in the last 168 hours. Hematology Recent Labs  Lab 06/24/17 1737  HGB 12.9*  HCT 38.0*   Cardiac EnzymesNo results for input(s): TROPONINI in the last 168 hours.  Recent Labs  Lab 06/24/17 1736  TROPIPOC 0.03    BNPNo results for input(s): BNP, PROBNP in the last 168 hours.  DDimer No results for input(s): DDIMER in the last 168 hours.  Radiology/Studies:  Dg Chest Portable 1 View  Result Date: 06/24/2017 CLINICAL DATA:  Status post cardiac arrest EXAM: PORTABLE CHEST 1 VIEW COMPARISON:  05/13/2017 FINDINGS: Cardiac shadow is prominent but accentuated by the portable technique. Lungs are well aerated bilaterally. No focal infiltrate or pneumothorax is seen. No acute bony abnormality is noted. IMPRESSION: No acute abnormality noted. Electronically Signed   By: Alcide Clever M.D.   On: 06/24/2017 18:10    Assessment and Plan:   CARDIAC  ARREST:  Unclear of the initial rhythm.  No details.  No evidence of acute ischemia.  Need to continue to cycle enzymes.  He will likely need another cath this admission given this event.  For now no acute indication for invasive evaluation.   Check echocardiogram.  Labs pending.    CAD:  As above.  Continue ASA, Plavix,  beta blocker and low dose nitrates.   I would suggest heparin on admission while we cycle the enzymes if he is cleared for acute neurologic event.    DYSLIPIDEMIA:  Continue statin.  HTN:  Hold Norvasc for now.    ESRD:  Per renal    For questions or updates, please contact [facility] Please consult [email or web address] for contact info under Cardiology/STEMI.   Signed, Rollene Rotunda, MD  06/24/2017 6:30 PM

## 2017-06-24 NOTE — ED Provider Notes (Signed)
Patient seen and evaluated with resident upon arrival.    Patient had undergone 3 of 4 hours of HD today.  Had syncopal episode.  AED applied.  3 delivered shocks.  10 minutes of CPR.  Upon arrival of ALS patient was found to have pulse and be in sinus tachycardia.  Was bag assisted with ventilations in route.  Arrives confused.  Bilateral tongue contusions. ?  Seizure.  EKG sinus without acute or ischemic changes.  Elevated lactate.  Getting fluids.  X-ray shows no trauma status post CPR.  No acute abnormality.  CT head shows chronic moderate small vessel ischemic change and left basal ganglia lacunar infarct without acute changes.  By critical care.  Seen by cardiology.  Per Dr. Antoine Poche, patient had only small vessel disease not amenable to intervention on most recent cath November 2018.  Critical care recommends hospitalist admission.   Rolland Porter, MD 06/24/17 2002

## 2017-06-24 NOTE — ED Notes (Signed)
Reject x1,  Notified nurse.

## 2017-06-24 NOTE — Progress Notes (Addendum)
ANTICOAGULATION CONSULT NOTE - Initial Consult  Pharmacy Consult for heparin Indication: chest pain/ACS  Allergies  Allergen Reactions  . Penicillins Anaphylaxis, Swelling and Rash    ANGIOEDEMA/"SWELLING OF ENTIRE BODY" Has patient had a PCN reaction causing immediate rash, facial/tongue/throat swelling, SOB or lightheadedness with hypotension: Yes Has patient had a PCN reaction causing severe rash involving mucus membranes or skin necrosis: No Has patient had a PCN reaction that required hospitalization: No Has patient had a PCN reaction occurring within the last 10 years: No If all of the above answers are "NO", then may proceed with Cephalosporin use.     Patient Measurements: Height: 5\' 7"  (170.2 cm) Weight: 216 lb 0.8 oz (98 kg) IBW/kg (Calculated) : 66.1 Heparin Dosing Weight: 87.2 kg  Vital Signs: BP: 142/86 (04/19 2000) Pulse Rate: 88 (04/19 2000)  Labs: Recent Labs    06/24/17 1730 06/24/17 1737  HGB 10.7* 12.9*  HCT 35.3* 38.0*  PLT 392  --   APTT 29  --   LABPROT 13.3  --   INR 1.02  --   CREATININE 5.43* 5.20*    Estimated Creatinine Clearance: 16.4 mL/min (A) (by C-G formula based on SCr of 5.2 mg/dL (H)).   Medical History: Past Medical History:  Diagnosis Date  . Arthritis    "right leg" (08/22/2014)  . CAD (coronary artery disease)    a. CAD s/p 2 stent placement in [place] 2016. b. Neg nuc 05/2014 & 01/2016.  Marland Kitchen CKD (chronic kidney disease), stage IV (HCC)    [name] 08/22/2014  . Daily headache   . Depression    "I always get that" (08/22/2014)  . Diabetes mellitus (HCC)   . DVT (deep venous thrombosis) (HCC) ?   RLE  . GERD (gastroesophageal reflux disease)   . Hypercholesterolemia   . Hypertension     Medications:  Scheduled:    Assessment: 62 yo Isaiyah presented to ED from HD as a post-CPR and suspected post-seizure. CT negative for acute intracranial abnormalities.  Patient has history of CAD s/p stenting. Patient on DAPT with  aspirin and plavix at home. CBC ok.   Goal of Therapy:  Heparin level 0.3-0.7 units/ml Monitor platelets by anticoagulation protocol: Yes   Plan:  Give 4,000 units bolus x 1 Start heparin infusion at 1,200 units/hr Check anti-Xa level in 8 hours and daily while on heparin Continue to monitor H&H and platelets  Bailee Thall A Merita Hawks 06/24/2017,8:15 PM

## 2017-06-24 NOTE — Consult Note (Signed)
Reason for Consult: To manage dialysis and dialysis related needs Referring Physician: Dr. Malachi Carl and Dr. Jimmey Ralph  Ronte TOA MIA is an 62 y.o. Marcelle.   HPI: Pt is a 34M with CAD, HTN, HLD, ESRD, and depression who is now seen in consultation at the request of Dr. Malachi Carl and Dr. Jimmey Ralph for eval and recs re: management of ESRD and provision of dialysis.    Briefly, pt was receiving dialysis today and suffered a cardiac arrest.  He had 2 hr 54 min of rx.   It appears that he was nearly 3 hrs into rx when he was noted to be unresponsive with either a seizure/ syncopal activity.  Was confirmed to be pulseless and CPR started, AED/ Ambubag applied.  Shocked with AED x 3, ROSC after 10 min, EMS called, transported to ED.    It appears that pt's UF goal was set for 1L more than usual- was not to be challenged that day, EDW 98 kg, apparently left at 97 kg (according to OP dialysis notes).  Was given rinseback of 250 cc at end of rx and additionally another 300 cc on top of that.  Pre-HD temperature was 100.4.  In ED, VBG was found to be largely unremarkable, K 3.2 on iSTAT, iCAL 0.89, Hgb 12.9, gluc 237, lactate of 12.66--> 6.10, trop 0.64, Head CT with no acute infarct, EKG with TWI II, AVF, V5, V6 (old), procalcitonin 0.21.  He is unable to provide history and keeps repeating "my chest hurts".  Dialyzes at [facility] 4 hours EDW 98 kg MWF HD [place] 2K/ 2 Ca, Dialyzer F180 BFR 400 mL/ min, Heparin 5000 u bolus. Access LUE AVG. Mircera 150 mcg q 2 weeks, last given 4/10 Venofer 100 mg q 5 rx, started 4/17 Calcitriol 0.5 mcg q rx  Past Medical History:  Diagnosis Date  . Arthritis    "right leg" (08/22/2014)  . CAD (coronary artery disease)    a. CAD s/p 2 stent placement in [place] 2016. b. Neg nuc 05/2014 & 01/2016.  Marland Kitchen CKD (chronic kidney disease), stage IV ([place])    Archie Endo 08/22/2014  . Daily headache   . Depression    "I always get that" (08/22/2014)  . Diabetes mellitus ([place])   . DVT (deep venous  thrombosis) ([place]) ?   RLE  . GERD (gastroesophageal reflux disease)   . Hypercholesterolemia   . Hypertension     Past Surgical History:  Procedure Laterality Date  . AV FISTULA PLACEMENT Left 10/01/2016   Procedure: INSERTION OF 4-7MM X 45CM ARTERIOVENOUS (AV) GORE-TEX GRAFT ARM;  Surgeon: Angelia Mould, MD;  Location: [facility];  Service: Vascular;  Laterality: Left;  . CORONARY ANGIOPLASTY WITH STENT PLACEMENT     "1 + 1"  . DRAINAGE AND CLOSURE OF LYMPHOCELE Left 10/29/2016   Procedure: EVACUATION OF LYMPHOCELE LEFT ARM ARTERIOVENOUS GRAFT;  Surgeon: Angelia Mould, MD;  Location: [facility] OR;  Service: Vascular;  Laterality: Left;  . IR AV DIALY SHUNT INTRO NEEDLE/INTRACATH INITIAL W/PTA/IMG LEFT  05/17/2017  . IR DIALY SHUNT INTRO NEEDLE/INTRACATH INITIAL W/IMG LEFT Left 05/16/2017  . IR PTA ADDL CENTRAL DIALYSIS SEG THRU DIALY CIRCUIT LEFT Left 05/17/2017  . IR US GUIDE VASC ACCESS LEFT  05/17/2017  . LEFT HEART CATH AND CORONARY ANGIOGRAPHY N/A 01/14/2017   Procedure: LEFT HEART CATH AND CORONARY ANGIOGRAPHY;  Surgeon: Martinique, Peter M, MD;  Location: [facility];  Service: Cardiovascular;  Laterality: N/A;    Family History  Problem Relation  Age of Onset  . Hypertension Mother   . Kidney disease Mother   . Heart disease Mother   . Heart disease Father   . Hypertension Father     Social History:  reports that he quit smoking about 6 years ago. His smoking use included cigarettes. He smoked 0.00 packs per day for 40.00 years. He has never used smokeless tobacco. He reports that he does not drink alcohol or use drugs.  Allergies:  Allergies  Allergen Reactions  . Penicillins Anaphylaxis, Swelling and Rash    ANGIOEDEMA/"SWELLING OF ENTIRE BODY" Has patient had a PCN reaction causing immediate rash, facial/tongue/throat swelling, SOB or lightheadedness with hypotension: Yes Has patient had a PCN reaction causing severe rash involving mucus membranes or skin  necrosis: No Has patient had a PCN reaction that required hospitalization: No Has patient had a PCN reaction occurring within the last 10 years: No If all of the above answers are "NO", then may proceed with Cephalosporin use.     Medications:  Scheduled: . [START ON 06/25/2017] aspirin EC  81 mg Oral Daily  . [START ON 06/25/2017] atorvastatin  80 mg Oral q1800  . [START ON 06/25/2017] clopidogrel  75 mg Oral Daily  . [START ON 06/25/2017] famotidine  20 mg Oral Daily  . insulin aspart  0-15 Units Subcutaneous Q4H  . lidocaine  1 patch Transdermal Q24H     Results for orders placed or performed during the hospital encounter of 06/24/17 (from the past 48 hour(s))  CBC     Status: Abnormal   Collection Time: 06/24/17  5:30 PM  Result Value Ref Range   WBC 11.0 (H) 4.0 - 10.5 K/uL   RBC 3.68 (L) 4.22 - 5.81 MIL/uL   Hemoglobin 10.7 (L) 13.0 - 17.0 g/dL   HCT 35.3 (L) 39.0 - 52.0 %   MCV 95.9 78.0 - 100.0 fL   MCH 29.1 26.0 - 34.0 pg   MCHC 30.3 30.0 - 36.0 g/dL   RDW 15.8 (H) 11.5 - 15.5 %   Platelets 392 150 - 400 K/uL    Comment: Performed at [facility], [place] [address], [place], [place] [address]  Comprehensive metabolic panel     Status: Abnormal   Collection Time: 06/24/17  5:30 PM  Result Value Ref Range   Sodium 137 135 - 145 mmol/L   Potassium 3.1 (L) 3.5 - 5.1 mmol/L   Chloride 94 (L) 101 - 111 mmol/L   CO2 19 (L) 22 - 32 mmol/L   Glucose, Bld 233 (H) 65 - 99 mg/dL   BUN 10 6 - 20 mg/dL   Creatinine, Ser 5.43 (H) 0.61 - 1.24 mg/dL   Calcium 8.2 (L) 8.9 - 10.3 mg/dL   Total Protein 7.6 6.5 - 8.1 g/dL   Albumin 3.8 3.5 - 5.0 g/dL   AST 92 (H) 15 - 41 U/L   ALT 65 (H) 17 - 63 U/L    Comment: RESULTS CONFIRMED BY MANUAL DILUTION   Alkaline Phosphatase 80 38 - 126 U/L   Total Bilirubin 0.9 0.3 - 1.2 mg/dL   GFR calc non Af Amer 10 (L) >60 mL/min   GFR calc Af Amer 12 (L) >60 mL/min    Comment: (NOTE) The eGFR has been calculated using the CKD EPI  equation. This calculation has not been validated in all clinical situations. eGFR's persistently <60 mL/min signify possible Chronic Kidney Disease.    Anion gap 24 (H) 5 - 15    Comment:  Performed at [facility], [place] [address] [place], [place] [address]  Magnesium     Status: None   Collection Time: 06/24/17  5:30 PM  Result Value Ref Range   Magnesium 2.2 1.7 - 2.4 mg/dL    Comment: Performed at [facility] [address] [place], [place] [address]  APTT     Status: None   Collection Time: 06/24/17  5:30 PM  Result Value Ref Range   aPTT 29 24 - 36 seconds    Comment: Performed at [facility] [address], [place], [place] [address]  Protime-INR     Status: None   Collection Time: 06/24/17  5:30 PM  Result Value Ref Range   Prothrombin Time 13.3 11.4 - 15.2 seconds   INR 1.02     Comment: Performed at [facility] [address], [place], [place] [address]  I-stat troponin, ED (0, 3)     Status: None   Collection Time: 06/24/17  5:36 PM  Result Value Ref Range   Troponin i, poc 0.03 0.00 - 0.08 ng/mL   Comment 3            Comment: Due to the release kinetics of cTnI, a negative result within the first hours of the onset of symptoms does not rule out myocardial infarction with certainty. If myocardial infarction is still suspected, repeat the test at appropriate intervals.   I-Stat Chem 8, ED     Status: Abnormal   Collection Time: 06/24/17  5:37 PM  Result Value Ref Range   Sodium 136 135 - 145 mmol/L   Potassium 3.2 (L) 3.5 - 5.1 mmol/L   Chloride 92 (L) 101 - 111 mmol/L   BUN 12 6 - 20 mg/dL   Creatinine, Ser 5.20 (H) 0.61 - 1.24 mg/dL   Glucose, Bld 237 (H) 65 - 99 mg/dL   Calcium, Ion 0.89 (LL) 1.15 - 1.40 mmol/L   TCO2 24 22 - 32 mmol/L   Hemoglobin 12.9 (L) 13.0 - 17.0 g/dL   HCT 38.0 (L) 39.0 - 52.0 %   Comment NOTIFIED PHYSICIAN   I-Stat venous blood gas, ED     Status: Abnormal   Collection Time: 06/24/17  5:37 PM   Result Value Ref Range   pH, Ven 7.349 7.250 - 7.430   pCO2, Ven 42.6 (L) 44.0 - 60.0 mmHg   pO2, Ven 76.0 (H) 32.0 - 45.0 mmHg   Bicarbonate 23.5 20.0 - 28.0 mmol/L   TCO2 25 22 - 32 mmol/L   O2 Saturation 94.0 %   Acid-base deficit 2.0 0.0 - 2.0 mmol/L   Patient temperature HIDE    Sample type VENOUS   I-Stat CG4 Lactic Acid, ED     Status: Abnormal   Collection Time: 06/24/17  5:38 PM  Result Value Ref Range   Lactic Acid, Venous 12.66 (HH) 0.5 - 1.9 mmol/L   Comment NOTIFIED PHYSICIAN   Troponin I     Status: Abnormal   Collection Time: 06/24/17  8:08 PM  Result Value Ref Range   Troponin I 0.64 (HH) <0.03 ng/mL    Comment: CRITICAL RESULT CALLED TO, READ BACK BY AND VERIFIED WITH: S.WATERMILL,RN 06/24/17 2122 DAVISB Performed at [facility], [address], [place], [place] [address]   I-stat troponin, ED (0, 3)     Status: Abnormal   Collection Time: 06/24/17  8:12 PM  Result Value Ref Range   Troponin i, poc 0.75 (  HH) 0.00 - 0.08 ng/mL   Comment NOTIFIED PHYSICIAN    Comment 3            Comment: Due to the release kinetics of cTnI, a negative result within the first hours of the onset of symptoms does not rule out myocardial infarction with certainty. If myocardial infarction is still suspected, repeat the test at appropriate intervals.   I-Stat CG4 Lactic Acid, ED     Status: Abnormal   Collection Time: 06/24/17  8:14 PM  Result Value Ref Range   Lactic Acid, Venous 6.10 (HH) 0.5 - 1.9 mmol/L   Comment NOTIFIED PHYSICIAN   Procalcitonin - Baseline     Status: None   Collection Time: 06/24/17  9:35 PM  Result Value Ref Range   Procalcitonin 0.21 ng/mL    Comment:        Interpretation: PCT (Procalcitonin) <= 0.5 ng/mL: Systemic infection (sepsis) is not likely. Local bacterial infection is possible. (NOTE)       Sepsis PCT Algorithm           Lower Respiratory Tract                                      Infection PCT Algorithm     ----------------------------     ----------------------------         PCT < 0.25 ng/mL                PCT < 0.10 ng/mL         Strongly encourage             Strongly discourage   discontinuation of antibiotics    initiation of antibiotics    ----------------------------     -----------------------------       PCT 0.25 - 0.50 ng/mL            PCT 0.10 - 0.25 ng/mL               OR       >80% decrease in PCT            Discourage initiation of                                            antibiotics      Encourage discontinuation           of antibiotics    ----------------------------     -----------------------------         PCT >= 0.50 ng/mL              PCT 0.26 - 0.50 ng/mL               AND        <80% decrease in PCT             Encourage initiation of                                             antibiotics       Encourage continuation           of antibiotics    ----------------------------     -----------------------------  PCT >= 0.50 ng/mL                  PCT > 0.50 ng/mL               AND         increase in PCT                  Strongly encourage                                      initiation of antibiotics    Strongly encourage escalation           of antibiotics                                     -----------------------------                                           PCT <= 0.25 ng/mL                                                 OR                                        > 80% decrease in PCT                                     Discontinue / Do not initiate                                             antibiotics Performed at [facility], [address], [place],  [address]     Ct Head Wo Contrast  Result Date: 06/24/2017 CLINICAL DATA:  Status post cardiac arrest. Patient arrived from dialysis unresponsive and pulseless. Now having seizure-like activities. EXAM: CT HEAD WITHOUT CONTRAST TECHNIQUE: Contiguous axial images were obtained from  the base of the skull through the vertex without intravenous contrast. COMPARISON:  12/23/2016 head CT FINDINGS: Brain: Chronic moderate small vessel ischemic disease with chronic left basal ganglial lacunar infarct. No acute intracranial hemorrhage, midline shift or edema. Redemonstration of mild to moderate ventriculomegaly. No intra-axial mass nor extra-axial fluid collections. Midline fourth ventricle and basal cisterns. No effacement. No large vascular territory infarct. Vascular: No hyperdense vessel sign.  No unexpected calcifications. Skull: Intact bony calvarium.  Small left mastoid effusion. Sinuses/Orbits: Mild ethmoid sinus mucosal thickening. No air-fluid levels. Intact orbits and globes. Other: None IMPRESSION: Redemonstration of chronic moderate small vessel ischemic disease and left basal ganglial lacunar infarct. Central atrophy. No acute intracranial abnormality. Electronically Signed   By: Ashley Royalty M.D.   On: 06/24/2017 18:30   Dg Chest Portable 1 View  Result Date: 06/24/2017 CLINICAL DATA:  Status post cardiac arrest EXAM: PORTABLE CHEST  1 VIEW COMPARISON:  05/13/2017 FINDINGS: Cardiac shadow is prominent but accentuated by the portable technique. Lungs are well aerated bilaterally. No focal infiltrate or pneumothorax is seen. No acute bony abnormality is noted. IMPRESSION: No acute abnormality noted. Electronically Signed   By: Inez Catalina M.D.   On: 06/24/2017 18:10    ROS: unobtainable- pt repeats "my chest hurts" Blood pressure 119/78, pulse 83, resp. rate 16, height '5\' 7"'  (1.702 m), weight 98 kg (216 lb 0.8 oz), SpO2 100 %. .  GEN older gentleman, appears anxious, shivering HEENT EOMI PERRL MMM NECK no JVD PULM clear bilaterally CV chest wall tenderness ABD obese, NABS EXT no LE edema NEURO nonfocal SKIN no rashes ACCESS: LUE AVG +T/B  Assessment/Plan: 1 Cardiac arrest: ? If due to aggressive set UF goal at HD- if UFR was greater than that of plasma refill, could  cause hypotension and cardiac arrest.  Unclear what presenting rhythm was.  Pt AAO x 3 now, on hep gtt, on RA.  Per primary 2 ESRD: Does not need any more dialysis today, will provide HD on MWF schedule 3 Hypertension: on amlodipine, metoprolol, imdur per OP dialysis med list 4. Anemia of ESRD: not due for ESA yet, will continue Fe load with next HD 5. Metabolic Bone Disease: Calcitriol 0.5 q rx, takes renagel 2 tabs TID AC 6.  Depression: on Zoloft 50 mg  7.  Dispo: ICU  Madelon Lips 06/24/2017, 11:05 PM

## 2017-06-24 NOTE — ED Notes (Signed)
I-stat chem 8 and lactic acid given to RN Tobi Bastos

## 2017-06-24 NOTE — Consult Note (Signed)
Patient Demographics:    Richard Barnett, is a 62 y.o. [place]  MRN: [id number]   DOB - 05/15/1955  Admit Date - 06/24/2017  Outpatient Primary MD for the patient is Patient, No Pcp Per   Assessment & Plan:    Active Problems:   Cardiac arrest [facility])  ED  provider initially called  hospitalist service to admit patient, however upon discussion with critical care team patient is more appropriate for critical care  Service and level of care.     ED provider and  critical care attending  Agrees that  Critical care will admit patient to the service.    no further involvement from hospitalist service at this time      With History of - Reviewed by me  Past Medical History:  Diagnosis Date  . Arthritis    "right leg" (08/22/2014)  . CAD (coronary artery disease)    a. CAD s/p 2 stent placement in [place] 2016. b. Neg nuc 05/2014 & 01/2016.  Marland Kitchen CKD (chronic kidney disease), stage IV (HCC)    [name] 08/22/2014  . Daily headache   . Depression    "I always get that" (08/22/2014)  . Diabetes mellitus (HCC)   . DVT (deep venous thrombosis) (HCC) ?   RLE  . GERD (gastroesophageal reflux disease)   . Hypercholesterolemia   . Hypertension       Past Surgical History:  Procedure Laterality Date  . AV FISTULA PLACEMENT Left 10/01/2016   Procedure: INSERTION OF 4-7MM X 45CM ARTERIOVENOUS (AV) GORE-TEX GRAFT ARM;  Surgeon: Chuck Hint, MD;  Location: [facility] OR;  Service: Vascular;  Laterality: Left;  . CORONARY ANGIOPLASTY WITH STENT PLACEMENT     "1 + 1"  . DRAINAGE AND CLOSURE OF LYMPHOCELE Left 10/29/2016   Procedure: EVACUATION OF LYMPHOCELE LEFT ARM ARTERIOVENOUS GRAFT;  Surgeon: Chuck Hint, MD;  Location: [facility] OR;  Service: Vascular;  Laterality: Left;  . IR AV DIALY SHUNT INTRO NEEDLE/INTRACATH  INITIAL W/PTA/IMG LEFT  05/17/2017  . IR DIALY SHUNT INTRO NEEDLE/INTRACATH INITIAL W/IMG LEFT Left 05/16/2017  . IR PTA ADDL CENTRAL DIALYSIS SEG THRU DIALY CIRCUIT LEFT Left 05/17/2017  . IR US GUIDE VASC ACCESS LEFT  05/17/2017  . LEFT HEART CATH AND CORONARY ANGIOGRAPHY N/A 01/14/2017   Procedure: LEFT HEART CATH AND CORONARY ANGIOGRAPHY;  Surgeon: [place], Peter M, MD;  Location: [facility] INVASIVE CV LAB;  Service: Cardiovascular;  Laterality: N/A;      Chief Complaint  Patient presents with  . Post CPR      HPI:    Richard Barnett  is a 62 y.o. [place]   ED  provider initially called  hospitalist service to admit patient, however upon discussion with critical care team patient is more appropriate for critical care  Service and level of care.     ED provider and  critical care attending  Agrees that  Critical care will admit patient to the service.  no further involvement from hospitalist service at this time   Review of systems:  ED  provider initially called  hospitalist service to admit patient, however upon discussion with critical care team patient is more appropriate for critical care  Service and level of care.     ED provider and  critical care attending  Agrees that  Critical care will admit patient to the service.    no further involvement from hospitalist service at this time   Social History:  Reviewed by me    Social History   Tobacco Use  . Smoking status: Former Smoker    Packs/day: 0.00    Years: 40.00    Pack years: 0.00    Types: Cigarettes    Last attempt to quit: 08/29/2010    Years since quitting: 6.8  . Smokeless tobacco: Never Used  . Tobacco comment: "quit smoking cigarettes in  2011" 10/27/16  Substance Use Topics  . Alcohol use: No    Comment: 10/27/16 "stopped in ~ 2011"       Family History :  Reviewed by me    Family History  Problem Relation Age of Onset  . Hypertension Mother   . Kidney disease Mother   . Heart disease Mother   . Heart  disease Father   . Hypertension Father       Home Medications:   Prior to Admission medications   Medication Sig Start Date End Date Taking? Authorizing Provider  amLODipine (NORVASC) 10 MG tablet Take 1 tablet (10 mg total) by mouth daily. 01/30/17 06/24/17 Yes Chundi, Sherlyn Lees, MD  atorvastatin (LIPITOR) 80 MG tablet Take 1 tablet (80 mg total) by mouth daily at 6 PM. 01/29/17  Yes Chundi, Vahini, MD  Brinzolamide-Brimonidine [facility]) 1-0.2 % SUSP Place 1 drop into both eyes 2 (two) times daily.   Yes [provider]  clopidogrel (PLAVIX) 75 MG tablet Take 1 tablet (75 mg total) by mouth daily. 01/30/17  Yes Chundi, Sherlyn Lees, MD  isosorbide mononitrate (IMDUR) 30 MG 24 hr tablet Take 1 tablet (30 mg total) by mouth daily. 01/29/17 06/24/17 Yes Chundi, Vahini, MD  latanoprost (XALATAN) 0.005 % ophthalmic solution Place 1 drop into both eyes daily.   Yes [provider]  metoprolol tartrate (LOPRESSOR) 50 MG tablet Take 1 tablet (50 mg total) by mouth 2 (two) times daily with a meal. 01/29/17 06/24/17 Yes Chundi, Vahini, MD  nitroGLYCERIN (NITROSTAT) 0.4 MG SL tablet DISSOLVE 1 TABLET UNDER THE TONGUE AS NEEDED FOR CHEST PAIN. REPEAT AS NEEDED EVERY 5 MINUTES UP TO A TOTAL OF 3 DOSES Patient taking differently: DISSOLVE 1 TABLET (0.4 MG) UNDER THE TONGUE AS NEEDED FOR CHEST PAIN/REPEAT AS NEEDED EVERY 5 MINUTES UP TO A TOTAL OF 3 DOSES 12/15/15  Yes Henrietta Hoover, NP  sertraline (ZOLOFT) 50 MG tablet Take 1 tablet (50 mg total) by mouth at bedtime. 05/21/17  Yes Penny Pia, MD  aspirin 81 MG EC tablet Take 1 tablet (81 mg total) by mouth daily. Swallow whole. Patient not taking: Reported on 06/24/2017 02/10/16   Massie Maroon, FNP  oxyCODONE-acetaminophen (PERCOCET/ROXICET) 5-325 MG tablet Take 1 tablet by mouth every 6 (six) hours as needed. Patient taking differently: Take 1 tablet by mouth every 6 (six) hours as needed (for pain).  10/29/16   Lars Mage, PA-C    RENAGEL 800 MG tablet Take 1,600 mg by mouth 3 (three) times daily with meals. 06/23/17   [provider]     Allergies:  Allergies  Allergen Reactions  . Penicillins Anaphylaxis, Swelling and Rash    ANGIOEDEMA/"SWELLING OF ENTIRE BODY" Has patient had a PCN reaction causing immediate rash, facial/tongue/throat swelling, SOB or lightheadedness with hypotension: Yes Has patient had a PCN reaction causing severe rash involving mucus membranes or skin necrosis: No Has patient had a PCN reaction that required hospitalization: No Has patient had a PCN reaction occurring within the last 10 years: No If all of the above answers are "NO", then may proceed with Cephalosporin use.      Physical Exam:   Vitals  Blood pressure 119/78, pulse 83, resp. rate 16, height 5\' 7"  (1.702 m), weight 98 kg (216 lb 0.8 oz), SpO2 100 %.  ED  provider initially called  hospitalist service to admit patient, however upon discussion with critical care team patient is more appropriate for critical care  Service and level of care.     ED provider and  critical care attending  Agrees that  Critical care will admit patient to the service.    no further involvement from hospitalist service at this time     Data Review:    CBC Recent Labs  Lab 06/24/17 1730 06/24/17 1737  WBC 11.0*  --   HGB 10.7* 12.9*  HCT 35.3* 38.0*  PLT 392  --   MCV 95.9  --   MCH 29.1  --   MCHC 30.3  --   RDW 15.8*  --    ------------------------------------------------------------------------------------------------------------------  Chemistries  Recent Labs  Lab 06/24/17 1730 06/24/17 1737  NA 137 136  K 3.1* 3.2*  CL 94* 92*  CO2 19*  --   GLUCOSE 233* 237*  BUN 10 12  CREATININE 5.43* 5.20*  CALCIUM 8.2*  --   MG 2.2  --   AST 92*  --   ALT 65*  --   ALKPHOS 80  --   BILITOT 0.9  --     ------------------------------------------------------------------------------------------------------------------ estimated creatinine clearance is 16.4 mL/min (A) (by C-G formula based on SCr of 5.2 mg/dL (H)). ------------------------------------------------------------------------------------------------------------------ No results for input(s): TSH, T4TOTAL, T3FREE, THYROIDAB in the last 72 hours.  Invalid input(s): FREET3   Coagulation profile Recent Labs  Lab 06/24/17 1730  INR 1.02   ------------------------------------------------------------------------------------------------------------------- No results for input(s): DDIMER in the last 72 hours. -------------------------------------------------------------------------------------------------------------------  Cardiac Enzymes No results for input(s): CKMB, TROPONINI, MYOGLOBIN in the last 168 hours.  Invalid input(s): CK ------------------------------------------------------------------------------------------------------------------    Component Value Date/Time   BNP 28.1 05/13/2017 1845     ---------------------------------------------------------------------------------------------------------------  Urinalysis    Component Value Date/Time   COLORURINE YELLOW 04/07/2017 0301   APPEARANCEUR CLEAR 04/07/2017 0301   LABSPEC 1.013 04/07/2017 0301   PHURINE 6.0 04/07/2017 0301   GLUCOSEU 150 (A) 04/07/2017 0301   HGBUR MODERATE (A) 04/07/2017 0301   BILIRUBINUR NEGATIVE 04/07/2017 0301   KETONESUR NEGATIVE 04/07/2017 0301   PROTEINUR >=300 (A) 04/07/2017 0301   UROBILINOGEN 0.2 02/21/2015 0955   NITRITE NEGATIVE 04/07/2017 0301   LEUKOCYTESUR NEGATIVE 04/07/2017 0301    ----------------------------------------------------------------------------------------------------------------   Imaging Results:    Ct Head Wo Contrast  Result Date: 06/24/2017 CLINICAL DATA:  Status post cardiac arrest. Patient  arrived from dialysis unresponsive and pulseless. Now having seizure-like activities. EXAM: CT HEAD WITHOUT CONTRAST TECHNIQUE: Contiguous axial images were obtained from the base of the skull through the vertex without intravenous contrast. COMPARISON:  12/23/2016 head CT FINDINGS: Brain: Chronic moderate small vessel ischemic disease with chronic left basal ganglial lacunar infarct. No acute  intracranial hemorrhage, midline shift or edema. Redemonstration of mild to moderate ventriculomegaly. No intra-axial mass nor extra-axial fluid collections. Midline fourth ventricle and basal cisterns. No effacement. No large vascular territory infarct. Vascular: No hyperdense vessel sign.  No unexpected calcifications. Skull: Intact bony calvarium.  Small left mastoid effusion. Sinuses/Orbits: Mild ethmoid sinus mucosal thickening. No air-fluid levels. Intact orbits and globes. Other: None IMPRESSION: Redemonstration of chronic moderate small vessel ischemic disease and left basal ganglial lacunar infarct. Central atrophy. No acute intracranial abnormality. Electronically Signed   By: Tollie Eth M.D.   On: 06/24/2017 18:30   Dg Chest Portable 1 View  Result Date: 06/24/2017 CLINICAL DATA:  Status post cardiac arrest EXAM: PORTABLE CHEST 1 VIEW COMPARISON:  05/13/2017 FINDINGS: Cardiac shadow is prominent but accentuated by the portable technique. Lungs are well aerated bilaterally. No focal infiltrate or pneumothorax is seen. No acute bony abnormality is noted. IMPRESSION: No acute abnormality noted. Electronically Signed   By: Alcide Clever M.D.   On: 06/24/2017 18:10    Radiological Exams on Admission: Ct Head Wo Contrast  Result Date: 06/24/2017 CLINICAL DATA:  Status post cardiac arrest. Patient arrived from dialysis unresponsive and pulseless. Now having seizure-like activities. EXAM: CT HEAD WITHOUT CONTRAST TECHNIQUE: Contiguous axial images were obtained from the base of the skull through the vertex without  intravenous contrast. COMPARISON:  12/23/2016 head CT FINDINGS: Brain: Chronic moderate small vessel ischemic disease with chronic left basal ganglial lacunar infarct. No acute intracranial hemorrhage, midline shift or edema. Redemonstration of mild to moderate ventriculomegaly. No intra-axial mass nor extra-axial fluid collections. Midline fourth ventricle and basal cisterns. No effacement. No large vascular territory infarct. Vascular: No hyperdense vessel sign.  No unexpected calcifications. Skull: Intact bony calvarium.  Small left mastoid effusion. Sinuses/Orbits: Mild ethmoid sinus mucosal thickening. No air-fluid levels. Intact orbits and globes. Other: None IMPRESSION: Redemonstration of chronic moderate small vessel ischemic disease and left basal ganglial lacunar infarct. Central atrophy. No acute intracranial abnormality. Electronically Signed   By: Tollie Eth M.D.   On: 06/24/2017 18:30   Dg Chest Portable 1 View  Result Date: 06/24/2017 CLINICAL DATA:  Status post cardiac arrest EXAM: PORTABLE CHEST 1 VIEW COMPARISON:  05/13/2017 FINDINGS: Cardiac shadow is prominent but accentuated by the portable technique. Lungs are well aerated bilaterally. No focal infiltrate or pneumothorax is seen. No acute bony abnormality is noted. IMPRESSION: No acute abnormality noted. Electronically Signed   By: Alcide Clever M.D.   On: 06/24/2017 18:10   ED  provider initially called  hospitalist service to admit patient, however upon discussion with critical care team patient is more appropriate for critical care  Service and level of care.     ED provider and  critical care attending  Agrees that  Critical care will admit patient to the service.    no further involvement from hospitalist service at this time  Shon Hale M.D on 06/24/2017 at 9:20 PM   Between 7am to 7pm - Pager - [phone] After 7pm go to [email or web address] - password [email or web address]  [facility] - Office  [phone]  Voice Recognition  Reubin Milan dictation system was used to create this note, attempts have been made to correct errors. Please contact the author with questions and/or clarifications.

## 2017-06-24 NOTE — H&P (Addendum)
PULMONARY / CRITICAL CARE MEDICINE   Name: Richard Barnett MRN: [id number] DOB: January 11, 1956    ADMISSION DATE:  06/24/2017 CONSULTATION DATE:  06/24/2017  REFERRING MD:  Dr. Fayrene Fearing   CHIEF COMPLAINT:  Cardiac Arrest   HISTORY OF PRESENT ILLNESS:   62 year old Richard Barnett with PMH of CAD s/p STENT 01/2017, ESRD on HD MWF (Began 04/2017), Depression, DM, GERD, HTN  Presents to ED on 4/19 from dialysis center. EMS reports that patient received 3 out of 4 hours of dialysis then was noted to have loss of  consciousness. AED placed. Patient shocked 3 times. Received 10 minutes of CPR before return of ROSC. Upon arrival to ED patient is confused but awake with contusions to tongue. Initial EKG with sinus tachycardia. CT head negative. Cardiology evaluated. Started Heparin for likely NSTEMI. PCCM asked to admit.  Nephrology reports that dialysis center incorrectly set UF to 1L over reported goal.   PAST MEDICAL HISTORY :  He  has a past medical history of Arthritis, CAD (coronary artery disease), CKD (chronic kidney disease), stage IV (HCC), Daily headache, Depression, Diabetes mellitus (HCC), DVT (deep venous thrombosis) (HCC) (?), GERD (gastroesophageal reflux disease), Hypercholesterolemia, and Hypertension.  PAST SURGICAL HISTORY: He  has a past surgical history that includes Coronary angioplasty with stent; AV fistula placement (Left, 10/01/2016); Drainage and closure of lymphocele (Left, 10/29/2016); LEFT HEART CATH AND CORONARY ANGIOGRAPHY (N/A, 01/14/2017); IR DIALY SHUNT INTRO NEEDLE/INTRACATH INITIAL W/IMG LEFT (Left, 05/16/2017); IR US Guide Vasc Access Left (05/17/2017); IR AV DIALY SHUNT INTRO NEEDLE/INTRACATH INITIAL W/PTA/IMG LEFT (05/17/2017); and IR PTA ADDL CENTRAL DIALYSIS SEG THRU DIALY CIRCUIT LEFT (Left, 05/17/2017).  Allergies  Allergen Reactions  . Penicillins Anaphylaxis, Swelling and Rash    ANGIOEDEMA/"SWELLING OF ENTIRE BODY" Has patient had a PCN reaction causing immediate rash,  facial/tongue/throat swelling, SOB or lightheadedness with hypotension: Yes Has patient had a PCN reaction causing severe rash involving mucus membranes or skin necrosis: No Has patient had a PCN reaction that required hospitalization: No Has patient had a PCN reaction occurring within the last 10 years: No If all of the above answers are "NO", then may proceed with Cephalosporin use.     No current facility-administered medications on file prior to encounter.    Current Outpatient Medications on File Prior to Encounter  Medication Sig  . amLODipine (NORVASC) 10 MG tablet Take 1 tablet (10 mg total) by mouth daily.  Marland Kitchen atorvastatin (LIPITOR) 80 MG tablet Take 1 tablet (80 mg total) by mouth daily at 6 PM.  . Brinzolamide-Brimonidine (SIMBRINZA) 1-0.2 % SUSP Place 1 drop into both eyes 2 (two) times daily.  . clopidogrel (PLAVIX) 75 MG tablet Take 1 tablet (75 mg total) by mouth daily.  . isosorbide mononitrate (IMDUR) 30 MG 24 hr tablet Take 1 tablet (30 mg total) by mouth daily.  Marland Kitchen latanoprost (XALATAN) 0.005 % ophthalmic solution Place 1 drop into both eyes daily.  . metoprolol tartrate (LOPRESSOR) 50 MG tablet Take 1 tablet (50 mg total) by mouth 2 (two) times daily with a meal.  . nitroGLYCERIN (NITROSTAT) 0.4 MG SL tablet DISSOLVE 1 TABLET UNDER THE TONGUE AS NEEDED FOR CHEST PAIN. REPEAT AS NEEDED EVERY 5 MINUTES UP TO A TOTAL OF 3 DOSES (Patient taking differently: DISSOLVE 1 TABLET (0.4 MG) UNDER THE TONGUE AS NEEDED FOR CHEST PAIN/REPEAT AS NEEDED EVERY 5 MINUTES UP TO A TOTAL OF 3 DOSES)  . sertraline (ZOLOFT) 50 MG tablet Take 1 tablet (50 mg total) by mouth at  bedtime.  Marland Kitchen aspirin 81 MG EC tablet Take 1 tablet (81 mg total) by mouth daily. Swallow whole. (Patient not taking: Reported on 06/24/2017)  . oxyCODONE-acetaminophen (PERCOCET/ROXICET) 5-325 MG tablet Take 1 tablet by mouth every 6 (six) hours as needed. (Patient taking differently: Take 1 tablet by mouth every 6 (six) hours as  needed (for pain). )  . RENAGEL 800 MG tablet Take 1,600 mg by mouth 3 (three) times daily with meals.    FAMILY HISTORY:  His indicated that his mother is deceased. He indicated that his father is deceased.   SOCIAL HISTORY: He  reports that he quit smoking about 6 years ago. His smoking use included cigarettes. He smoked 0.00 packs per day for 40.00 years. He has never used smokeless tobacco. He reports that he does not drink alcohol or use drugs.  REVIEW OF SYSTEMS:   Unable to review as patient is encephalopathic   SUBJECTIVE:   VITAL SIGNS: BP 119/78   Pulse 83   Resp 16   Ht 5\' 7"  (1.702 m)   Wt 98 kg (216 lb 0.8 oz)   SpO2 100%   BMI 33.84 kg/m   HEMODYNAMICS:    VENTILATOR SETTINGS:    INTAKE / OUTPUT: No intake/output data recorded.  PHYSICAL EXAMINATION: General:  Adult Richard Barnett, no distress  Neuro:  Confused, oriented to self, alert, follows commands  HEENT:  Dry MM Cardiovascular:  RRR, no MRG  Lungs:  Clear breath sounds, no wheeze/crackles  Abdomen:  Obese, active bowel sounds Musculoskeletal:  -edema  Skin:  Warm, dry, intact   LABS:  BMET Recent Labs  Lab 06/24/17 1730 06/24/17 1737  NA 137 136  K 3.1* 3.2*  CL 94* 92*  CO2 19*  --   BUN 10 12  CREATININE 5.43* 5.20*  GLUCOSE 233* 237*    Electrolytes Recent Labs  Lab 06/24/17 1730  CALCIUM 8.2*  MG 2.2    CBC Recent Labs  Lab 06/24/17 1730 06/24/17 1737  WBC 11.0*  --   HGB 10.7* 12.9*  HCT 35.3* 38.0*  PLT 392  --     Coag's Recent Labs  Lab 06/24/17 1730  APTT 29  INR 1.02    Sepsis Markers Recent Labs  Lab 06/24/17 1738 06/24/17 2014  LATICACIDVEN 12.66* 6.10*    ABG No results for input(s): PHART, PCO2ART, PO2ART in the last 168 hours.  Liver Enzymes Recent Labs  Lab 06/24/17 1730  AST 92*  ALT 65*  ALKPHOS 80  BILITOT 0.9  ALBUMIN 3.8    Cardiac Enzymes Recent Labs  Lab 06/24/17 2008  TROPONINI 0.64*    Glucose No results for  input(s): GLUCAP in the last 168 hours.  Imaging Ct Head Wo Contrast  Result Date: 06/24/2017 CLINICAL DATA:  Status post cardiac arrest. Patient arrived from dialysis unresponsive and pulseless. Now having seizure-like activities. EXAM: CT HEAD WITHOUT CONTRAST TECHNIQUE: Contiguous axial images were obtained from the base of the skull through the vertex without intravenous contrast. COMPARISON:  12/23/2016 head CT FINDINGS: Brain: Chronic moderate small vessel ischemic disease with chronic left basal ganglial lacunar infarct. No acute intracranial hemorrhage, midline shift or edema. Redemonstration of mild to moderate ventriculomegaly. No intra-axial mass nor extra-axial fluid collections. Midline fourth ventricle and basal cisterns. No effacement. No large vascular territory infarct. Vascular: No hyperdense vessel sign.  No unexpected calcifications. Skull: Intact bony calvarium.  Small left mastoid effusion. Sinuses/Orbits: Mild ethmoid sinus mucosal thickening. No air-fluid levels. Intact orbits and globes. Other:  None IMPRESSION: Redemonstration of chronic moderate small vessel ischemic disease and left basal ganglial lacunar infarct. Central atrophy. No acute intracranial abnormality. Electronically Signed   By: Tollie Eth M.D.   On: 06/24/2017 18:30   Dg Chest Portable 1 View  Result Date: 06/24/2017 CLINICAL DATA:  Status post cardiac arrest EXAM: PORTABLE CHEST 1 VIEW COMPARISON:  05/13/2017 FINDINGS: Cardiac shadow is prominent but accentuated by the portable technique. Lungs are well aerated bilaterally. No focal infiltrate or pneumothorax is seen. No acute bony abnormality is noted. IMPRESSION: No acute abnormality noted. Electronically Signed   By: Alcide Clever M.D.   On: 06/24/2017 18:10     STUDIES:  Left heart Cath 01/14/17 >   Ost LAD lesion is 30% stenosed.   Prox LAD lesion is 30% stenosed.  Mid LAD lesion is 30% stenosed.  1st Mrg lesion is 90% stenosed.  Ost 1st Diag  lesion is 75% stenosed.  Prox RCA to Mid RCA lesion is 10% stenosed.  LV end diastolic pressure is moderately elevated. 1. Left dominant circulation 2. 2 vessel obstructive CAD    - 75% small diagonal branch    - 90% first OM - this is a small branch that bifurcates in the mid vessel. There is in stent restenosis. The vessel is small < 2.25 mm and disease extends past a bifurcation 3. Moderately elevated LVEDP CT Head 4/19 > Chronic moderate small vessel ischemic disease with chronic left basal ganglial lacunar infarct. No acute intracranial hemorrhage, midline shift or edema. Redemonstration of mild to moderate ventriculomegaly. No intra-axial mass nor extra-axial fluid collections. Midline fourth ventricle and basal cisterns. No effacement. No large vascular territory infarct. CXR 4/19 > Cardiac shadow is prominent but accentuated by the portable technique. Lungs are well aerated bilaterally. No focal infiltrate or pneumothorax is seen. No acute bony abnormality is noted.  CULTURES: Blood 4/19 >> U/A 4/19 >>  ANTIBIOTICS: None  SIGNIFICANT EVENTS: 4/19 > Presents to ED   LINES/TUBES: Left AVF  PIV   DISCUSSION: 62 year old Phillips with ESRD on HD. Presents from outpatient dialysis center s/p cardiac arrest. Received 3 hours of HD and then became unresponsive and pulseless. Received Shock x3 and 10 minutes CPR before achieving ROSC.    ASSESSMENT / PLAN:  PULMONARY A: H/O Tobacco Use > Reportedly quit years ago, Previous CT Chest with Emphysematous changes with minimal dependent subpleural ground-glass   P:   Maintain Oxygen Saturation >92 Pulmonary Hygiene   CARDIOVASCULAR A:  S/P Cardiac Arrest (Reported Defib x3 with 10 minutes CPR)  -Nephrology reports that UF at Dialysis was set incorrectly to one liter above ordered goal Grade 2 Diastolic Dysfunction (EF 55-60)  Pulmonary HTN (PA Peak 40)  CAD s/p Stents Cath 01/2017 with Mrg Lesion 90% Stenosed, Ost Diag Lesion  75% Stenosis, LVEDP moderately elevated  HTN, HLD   P:  Cardiology Following > Plans for possible CATH  Cardiac Monitoring  ECHO pending  Trend Troponin  Heparin gtt  Continue ASA/Plavix, Lipitor  Hold Norvasc  RENAL A:   Anion Gap Metabolic Acidosis with Lactic Acidosis presumed due to hypoperfusion in setting of cardiac arrest  ESRD with HD MWF   Hypokalemia  P:   Nephrology Following  Trend BMP Trend LA  Replace electrolytes as indicated > K replacing now   GASTROINTESTINAL A:   GERD P:   NPO Pepcid   HEMATOLOGIC A:   Anticoagulation needs as above  Anemia of Chronic Disease  P:  Trend CBC  Trend PTT Continue Heparin gtt SCDS  INFECTIOUS A:   No source of infection noted  P:   Trend Fever and WBC Curve Trend PCT and LA  Follow Culture Data    ENDOCRINE A:   DM   P:   Trend Glucose  SSI  NEUROLOGIC A:   Encephalopathy presumed in setting of hypoperfusion s/p cardiac arrest  H/O Depression, SI documented on 3/16 Admission  P:   Monitor  EEG pending  Hold Zoloft    FAMILY  - Updates: Daughter updated via phone.   - Inter-disciplinary family meet or Palliative Care meeting due by: 07/01/2017    Jovita Kussmaul, AGACNP-BC [facility]  Pgr: [phone]  PCCM Pgr: [phone]

## 2017-06-24 NOTE — ED Triage Notes (Signed)
Pt in from dialysis center via [facility] as post-arrest. Per EMS, pt was 3 hrs into dialysis and went unresponsive, pulseless. 10 min of CPR by staff, AED defib x 3. Pulses returned by EMS arrival, ST EKG, assisted ventilations en route. Arrives on NRB, nonverbal. GCS of 9. Given 5mg  Albuterol.

## 2017-06-24 NOTE — ED Notes (Addendum)
Troponin and Lactic results given to Dr. Fayrene Fearing

## 2017-06-24 NOTE — ED Provider Notes (Signed)
[facility] EMERGENCY DEPARTMENT Provider Note   CSN: [id number] Arrival date & time: 06/24/17  1721     History   Chief Complaint Chief Complaint  Patient presents with  . Post CPR    HPI Richard Barnett is a 62 y.o. Jerl.  Level 5 caveat due to cardiac arrest, history is given by EMS.  The history is provided by the EMS personnel.  Cardiac Arrest  Witnessed by:  Bystander Incident location: dialysis. Time before BLS initiated:  Immediate Condition upon EMS arrival:  Normal respirations Pulse:  Present Cardiac rhythm: Shockable rhythm per AED. Treatments prior to arrival:  AED discharged Defibrillation prior to arrival:  200 J Number of shocks delivered:  3 Defibrillation successful: yes   Rhythm after defibrillation:  Sinus tachycardia Airway:  Bag valve mask Rhythm on admission to ED:  Sinus tachycardia   Past Medical History:  Diagnosis Date  . Arthritis    "right leg" (08/22/2014)  . CAD (coronary artery disease)    a. CAD s/p 2 stent placement in [place] 2016. b. Neg nuc 05/2014 & 01/2016.  Marland Kitchen CKD (chronic kidney disease), stage IV (HCC)    [name] 08/22/2014  . Daily headache   . Depression    "I always get that" (08/22/2014)  . Diabetes mellitus (HCC)   . DVT (deep venous thrombosis) (HCC) ?   RLE  . GERD (gastroesophageal reflux disease)   . Hypercholesterolemia   . Hypertension     Patient Active Problem List   Diagnosis Date Noted  . Cardiac arrest (HCC) 06/24/2017  . Adjustment disorder with mixed disturbance of emotions and conduct   . Major depressive disorder, recurrent episode, severe (HCC) 05/14/2017  . GERD (gastroesophageal reflux disease) 05/13/2017  . ESRD on dialysis (HCC) 05/13/2017  . Suicidal ideation 05/13/2017  . NSTEMI (non-ST elevated myocardial infarction) (HCC) 01/27/2017  . Diabetes mellitus (HCC)   . Anemia of chronic renal failure   . Secondary hyperparathyroidism of renal origin (HCC)   . Proteinuria     . Arthritis   . Depression   . Deep vein thrombosis (HCC)   . Myocardial infarction (HCC)   . Heart failure, unspecified (HCC)   . Heart murmur   . Prediabetes 02/22/2015  . Chronic kidney disease, stage IV (severe) (HCC)   . Hyperlipidemia 05/22/2014  . Chest pain 05/21/2014  . Hypokalemia 05/21/2014  . CAD (coronary artery disease) 05/21/2014  . Hypertension 05/21/2014    Past Surgical History:  Procedure Laterality Date  . AV FISTULA PLACEMENT Left 10/01/2016   Procedure: INSERTION OF 4-7MM X 45CM ARTERIOVENOUS (AV) GORE-TEX GRAFT ARM;  Surgeon: Chuck Hint, MD;  Location: [facility] OR;  Service: Vascular;  Laterality: Left;  . CORONARY ANGIOPLASTY WITH STENT PLACEMENT     "1 + 1"  . DRAINAGE AND CLOSURE OF LYMPHOCELE Left 10/29/2016   Procedure: EVACUATION OF LYMPHOCELE LEFT ARM ARTERIOVENOUS GRAFT;  Surgeon: Chuck Hint, MD;  Location: [facility] OR;  Service: Vascular;  Laterality: Left;  . IR AV DIALY SHUNT INTRO NEEDLE/INTRACATH INITIAL W/PTA/IMG LEFT  05/17/2017  . IR DIALY SHUNT INTRO NEEDLE/INTRACATH INITIAL W/IMG LEFT Left 05/16/2017  . IR PTA ADDL CENTRAL DIALYSIS SEG THRU DIALY CIRCUIT LEFT Left 05/17/2017  . IR US GUIDE VASC ACCESS LEFT  05/17/2017  . LEFT HEART CATH AND CORONARY ANGIOGRAPHY N/A 01/14/2017   Procedure: LEFT HEART CATH AND CORONARY ANGIOGRAPHY;  Surgeon: [place], Peter M, MD;  Location: [facility] INVASIVE CV LAB;  Service: Cardiovascular;  Laterality:  N/A;        Home Medications    Prior to Admission medications   Medication Sig Start Date End Date Taking? Authorizing Provider  amLODipine (NORVASC) 10 MG tablet Take 1 tablet (10 mg total) by mouth daily. 01/30/17 06/24/17 Yes Chundi, Sherlyn Lees, MD  atorvastatin (LIPITOR) 80 MG tablet Take 1 tablet (80 mg total) by mouth daily at 6 PM. 01/29/17  Yes Chundi, Vahini, MD  Brinzolamide-Brimonidine [facility]) 1-0.2 % SUSP Place 1 drop into both eyes 2 (two) times daily.   Yes [provider]   clopidogrel (PLAVIX) 75 MG tablet Take 1 tablet (75 mg total) by mouth daily. 01/30/17  Yes Chundi, Sherlyn Lees, MD  isosorbide mononitrate (IMDUR) 30 MG 24 hr tablet Take 1 tablet (30 mg total) by mouth daily. 01/29/17 06/24/17 Yes Chundi, Vahini, MD  latanoprost (XALATAN) 0.005 % ophthalmic solution Place 1 drop into both eyes daily.   Yes [provider]  metoprolol tartrate (LOPRESSOR) 50 MG tablet Take 1 tablet (50 mg total) by mouth 2 (two) times daily with a meal. 01/29/17 06/24/17 Yes Chundi, Vahini, MD  nitroGLYCERIN (NITROSTAT) 0.4 MG SL tablet DISSOLVE 1 TABLET UNDER THE TONGUE AS NEEDED FOR CHEST PAIN. REPEAT AS NEEDED EVERY 5 MINUTES UP TO A TOTAL OF 3 DOSES Patient taking differently: DISSOLVE 1 TABLET (0.4 MG) UNDER THE TONGUE AS NEEDED FOR CHEST PAIN/REPEAT AS NEEDED EVERY 5 MINUTES UP TO A TOTAL OF 3 DOSES 12/15/15  Yes Henrietta Hoover, NP  sertraline (ZOLOFT) 50 MG tablet Take 1 tablet (50 mg total) by mouth at bedtime. 05/21/17  Yes Penny Pia, MD  aspirin 81 MG EC tablet Take 1 tablet (81 mg total) by mouth daily. Swallow whole. Patient not taking: Reported on 06/24/2017 02/10/16   Massie Maroon, FNP  oxyCODONE-acetaminophen (PERCOCET/ROXICET) 5-325 MG tablet Take 1 tablet by mouth every 6 (six) hours as needed. Patient taking differently: Take 1 tablet by mouth every 6 (six) hours as needed (for pain).  10/29/16   Lars Mage, PA-C  RENAGEL 800 MG tablet Take 1,600 mg by mouth 3 (three) times daily with meals. 06/23/17   [provider]    Family History Family History  Problem Relation Age of Onset  . Hypertension Mother   . Kidney disease Mother   . Heart disease Mother   . Heart disease Father   . Hypertension Father     Social History Social History   Tobacco Use  . Smoking status: Former Smoker    Packs/day: 0.00    Years: 40.00    Pack years: 0.00    Types: Cigarettes    Last attempt to quit: 08/29/2010    Years since quitting: 6.8  .  Smokeless tobacco: Never Used  . Tobacco comment: "quit smoking cigarettes in  2011" 10/27/16  Substance Use Topics  . Alcohol use: No    Comment: 10/27/16 "stopped in ~ 2011"  . Drug use: No    Types: "Crack" cocaine, Other-see comments    Comment: 10/27/16 "stopped in ~ 2011; all types of drugs""     Allergies   Penicillins   Review of Systems Review of Systems  Unable to perform ROS: Acuity of condition     Physical Exam Updated Vital Signs BP (!) 142/86   Pulse 88   Resp (!) 22   Ht 5\' 7"  (1.702 m)   Wt 98 kg (216 lb 0.8 oz)   SpO2 98%   BMI 33.84 kg/m   Physical Exam  Constitutional: He appears well-developed. He appears distressed.  HENT:  Head: Normocephalic and atraumatic.  Tongue with laceration  Eyes: Pupils are equal, round, and reactive to light. Conjunctivae and EOM are normal.  Neck: Normal range of motion. Neck supple. No tracheal deviation present.  Cardiovascular: Normal rate, regular rhythm, normal heart sounds and intact distal pulses.  No murmur heard. Pulmonary/Chest: Effort normal and breath sounds normal. He has no rales.  Abdominal: Soft. He exhibits no distension.  Musculoskeletal: Normal range of motion. He exhibits no edema or deformity.  Neurological: GCS eye subscore is 4. GCS verbal subscore is 1. GCS motor subscore is 4.  Skin: Skin is warm. Capillary refill takes less than 2 seconds. No rash noted.     ED Treatments / Results  Labs (all labs ordered are listed, but only abnormal results are displayed) Labs Reviewed  CBC - Abnormal; Notable for the following components:      Result Value   WBC 11.0 (*)    RBC 3.68 (*)    Hemoglobin 10.7 (*)    HCT 35.3 (*)    RDW 15.8 (*)    All other components within normal limits  COMPREHENSIVE METABOLIC PANEL - Abnormal; Notable for the following components:   Potassium 3.1 (*)    Chloride 94 (*)    CO2 19 (*)    Glucose, Bld 233 (*)    Creatinine, Ser 5.43 (*)    Calcium 8.2 (*)     AST 92 (*)    ALT 65 (*)    GFR calc non Af Amer 10 (*)    GFR calc Af Amer 12 (*)    Anion gap 24 (*)    All other components within normal limits  I-STAT CHEM 8, ED - Abnormal; Notable for the following components:   Potassium 3.2 (*)    Chloride 92 (*)    Creatinine, Ser 5.20 (*)    Glucose, Bld 237 (*)    Calcium, Ion 0.89 (*)    Hemoglobin 12.9 (*)    HCT 38.0 (*)    All other components within normal limits  I-STAT CG4 LACTIC ACID, ED - Abnormal; Notable for the following components:   Lactic Acid, Venous 12.66 (*)    All other components within normal limits  I-STAT VENOUS BLOOD GAS, ED - Abnormal; Notable for the following components:   pCO2, Ven 42.6 (*)    pO2, Ven 76.0 (*)    All other components within normal limits  I-STAT CG4 LACTIC ACID, ED - Abnormal; Notable for the following components:   Lactic Acid, Venous 6.10 (*)    All other components within normal limits  I-STAT TROPONIN, ED - Abnormal; Notable for the following components:   Troponin i, poc 0.75 (*)    All other components within normal limits  CULTURE, BLOOD (ROUTINE X 2)  CULTURE, BLOOD (ROUTINE X 2)  MAGNESIUM  APTT  PROTIME-INR  BLOOD GAS, VENOUS  TROPONIN I  HEPARIN LEVEL (UNFRACTIONATED)  CBC  LACTIC ACID, PLASMA  PROCALCITONIN  PROCALCITONIN  BASIC METABOLIC PANEL  MAGNESIUM  PHOSPHORUS  HEMOGLOBIN A1C  CBG MONITORING, ED  I-STAT TROPONIN, ED  I-STAT CG4 LACTIC ACID, ED    EKG None  Radiology Ct Head Wo Contrast  Result Date: 06/24/2017 CLINICAL DATA:  Status post cardiac arrest. Patient arrived from dialysis unresponsive and pulseless. Now having seizure-like activities. EXAM: CT HEAD WITHOUT CONTRAST TECHNIQUE: Contiguous axial images were obtained from the base of the skull through the vertex without  intravenous contrast. COMPARISON:  12/23/2016 head CT FINDINGS: Brain: Chronic moderate small vessel ischemic disease with chronic left basal ganglial lacunar infarct. No acute  intracranial hemorrhage, midline shift or edema. Redemonstration of mild to moderate ventriculomegaly. No intra-axial mass nor extra-axial fluid collections. Midline fourth ventricle and basal cisterns. No effacement. No large vascular territory infarct. Vascular: No hyperdense vessel sign.  No unexpected calcifications. Skull: Intact bony calvarium.  Small left mastoid effusion. Sinuses/Orbits: Mild ethmoid sinus mucosal thickening. No air-fluid levels. Intact orbits and globes. Other: None IMPRESSION: Redemonstration of chronic moderate small vessel ischemic disease and left basal ganglial lacunar infarct. Central atrophy. No acute intracranial abnormality. Electronically Signed   By: Tollie Eth M.D.   On: 06/24/2017 18:30   Dg Chest Portable 1 View  Result Date: 06/24/2017 CLINICAL DATA:  Status post cardiac arrest EXAM: PORTABLE CHEST 1 VIEW COMPARISON:  05/13/2017 FINDINGS: Cardiac shadow is prominent but accentuated by the portable technique. Lungs are well aerated bilaterally. No focal infiltrate or pneumothorax is seen. No acute bony abnormality is noted. IMPRESSION: No acute abnormality noted. Electronically Signed   By: Alcide Clever M.D.   On: 06/24/2017 18:10    Procedures Procedures (including critical care time)  Medications Ordered in ED Medications  heparin bolus via infusion 4,000 Units (has no administration in time range)  heparin ADULT infusion 100 units/mL (25000 units/220mL sodium chloride 0.45%) (has no administration in time range)  insulin aspart (novoLOG) injection 0-15 Units (has no administration in time range)  0.9 %  sodium chloride infusion (has no administration in time range)  atorvastatin (LIPITOR) tablet 80 mg (has no administration in time range)  aspirin EC tablet 81 mg (has no administration in time range)  calcium gluconate inj 10% (1 g) URGENT USE ONLY! (1 g Intravenous Given 06/24/17 1827)  potassium chloride 10 mEq in 100 mL IVPB (10 mEq Intravenous New  Bag/Given 06/24/17 1958)  aspirin chewable tablet 324 mg (324 mg Oral Given 06/24/17 1957)  acetaminophen (TYLENOL) tablet 1,000 mg (1,000 mg Oral Given 06/24/17 1957)     Initial Impression / Assessment and Plan / ED Course  I have reviewed the triage vital signs and the nursing notes.  Pertinent labs & imaging results that were available during my care of the patient were reviewed by me and considered in my medical decision making (see chart for details).     Richard Barnett is a 63 year old [place] with history of CAD, end-stage renal disease on hemodialysis who presents to the ED following cardiac arrest.  Patient with normal vitals upon arrival.  Patient breathing on room air.  Has a GCS of 9.  Patient was at dialysis when after 3 hours of dialysis he got altered and lost consciousness and had AED applied in which he is defibrillated 3 times.  Patient got CPR but no medications.  When EMS arrived patient had a pulse and was in sinus tachycardia on EKG.  He was bag assisted with ventilations in route.  Patient arrives confused but breathing on his own.  Has some contusions to his tongue.  Patient likely encephalopathic following cardiac arrest.  Patient with initial EKG here that shows sinus tachycardia.  Patient with labs ordered including troponin, chest x-ray, head CT.  Patient with low calcium and potassium and they were repleted.  Head CT within normal limits.  Chest x-ray showed no signs of pneumothorax, pneumonia.  Troponin within normal limits.  Otherwise labs unremarkable.  Cardiology was consulted and they came down to  the ED to evaluate the patient.  They recommend starting heparin for likely  NSTEMI.  Patient likely to get catheterized during admission.  Patient to be admitted to hospitalist service for further care.  Patient started on IV heparin bolus and drip and given 324 aspirin.  Troponin on repeat was elevated. Lactic acid improving, no concern for sepsis, elevation from arrest.   Patient with NSTEMI and otherwise hemodynamically stable to my care.  Final Clinical Impressions(s) / ED Diagnoses   Final diagnoses:  NSTEMI (non-ST elevated myocardial infarction) [facility])  Cardiac arrest [facility])  Hypokalemia  Hypocalcemia    ED Discharge Orders    None       Virgina Norfolk, DO 06/24/17 2102    Rolland Porter, MD 07/03/17 1531

## 2017-06-25 ENCOUNTER — Inpatient Hospital Stay (HOSPITAL_COMMUNITY): Payer: Medicaid Other

## 2017-06-25 DIAGNOSIS — I25119 Atherosclerotic heart disease of native coronary artery with unspecified angina pectoris: Secondary | ICD-10-CM

## 2017-06-25 DIAGNOSIS — I361 Nonrheumatic tricuspid (valve) insufficiency: Secondary | ICD-10-CM

## 2017-06-25 LAB — BASIC METABOLIC PANEL
ANION GAP: 16 — AB (ref 5–15)
Anion gap: 15 (ref 5–15)
BUN: 16 mg/dL (ref 6–20)
BUN: 16 mg/dL (ref 6–20)
CHLORIDE: 95 mmol/L — AB (ref 101–111)
CHLORIDE: 96 mmol/L — AB (ref 101–111)
CO2: 24 mmol/L (ref 22–32)
CO2: 26 mmol/L (ref 22–32)
Calcium: 8.5 mg/dL — ABNORMAL LOW (ref 8.9–10.3)
Calcium: 8.6 mg/dL — ABNORMAL LOW (ref 8.9–10.3)
Creatinine, Ser: 6.28 mg/dL — ABNORMAL HIGH (ref 0.61–1.24)
Creatinine, Ser: 6.65 mg/dL — ABNORMAL HIGH (ref 0.61–1.24)
GFR calc Af Amer: 10 mL/min — ABNORMAL LOW (ref >60)
GFR calc non Af Amer: 8 mL/min — ABNORMAL LOW (ref >60)
GFR, EST AFRICAN AMERICAN: 9 mL/min — AB (ref >60)
GFR, EST NON AFRICAN AMERICAN: 9 mL/min — AB (ref >60)
GLUCOSE: 130 mg/dL — AB (ref 65–99)
Glucose, Bld: 116 mg/dL — ABNORMAL HIGH (ref 65–99)
POTASSIUM: 4 mmol/L (ref 3.5–5.1)
Potassium: 3.7 mmol/L (ref 3.5–5.1)
SODIUM: 137 mmol/L (ref 135–145)
Sodium: 135 mmol/L (ref 135–145)

## 2017-06-25 LAB — GLUCOSE, CAPILLARY
GLUCOSE-CAPILLARY: 109 mg/dL — AB (ref 65–99)
GLUCOSE-CAPILLARY: 116 mg/dL — AB (ref 65–99)
GLUCOSE-CAPILLARY: 131 mg/dL — AB (ref 65–99)
GLUCOSE-CAPILLARY: 62 mg/dL — AB (ref 65–99)
GLUCOSE-CAPILLARY: 62 mg/dL — AB (ref 65–99)
Glucose-Capillary: 114 mg/dL — ABNORMAL HIGH (ref 65–99)
Glucose-Capillary: 133 mg/dL — ABNORMAL HIGH (ref 65–99)
Glucose-Capillary: 138 mg/dL — ABNORMAL HIGH (ref 65–99)
Glucose-Capillary: 164 mg/dL — ABNORMAL HIGH (ref 65–99)

## 2017-06-25 LAB — LACTIC ACID, PLASMA
LACTIC ACID, VENOUS: 5.2 mmol/L — AB (ref 0.5–1.9)
LACTIC ACID, VENOUS: 2.6 mmol/L — AB (ref 0.5–1.9)

## 2017-06-25 LAB — CBC
HCT: 32.3 % — ABNORMAL LOW (ref 39.0–52.0)
HEMOGLOBIN: 10.2 g/dL — AB (ref 13.0–17.0)
MCH: 29.6 pg (ref 26.0–34.0)
MCHC: 31.6 g/dL (ref 30.0–36.0)
MCV: 93.6 fL (ref 78.0–100.0)
Platelets: 353 10*3/uL (ref 150–400)
RBC: 3.45 MIL/uL — ABNORMAL LOW (ref 4.22–5.81)
RDW: 15.9 % — AB (ref 11.5–15.5)
WBC: 7.9 10*3/uL (ref 4.0–10.5)

## 2017-06-25 LAB — HEPARIN LEVEL (UNFRACTIONATED)
Heparin Unfractionated: <0.1 IU/mL — ABNORMAL LOW (ref 0.30–0.70)
Heparin Unfractionated: 0.91 IU/mL — ABNORMAL HIGH (ref 0.30–0.70)

## 2017-06-25 LAB — HEMOGLOBIN A1C
Hgb A1c MFr Bld: 4.9 % (ref 4.8–5.6)
Mean Plasma Glucose: 93.93 mg/dL

## 2017-06-25 LAB — TROPONIN I
TROPONIN I: 5.51 ng/mL — AB (ref <0.03)
Troponin I: 5.9 ng/mL (ref <0.03)

## 2017-06-25 LAB — PHOSPHORUS: Phosphorus: 2.3 mg/dL — ABNORMAL LOW (ref 2.5–4.6)

## 2017-06-25 LAB — MRSA PCR SCREENING: MRSA BY PCR: NEGATIVE

## 2017-06-25 LAB — ECHOCARDIOGRAM COMPLETE
HEIGHTINCHES: 67 in
WEIGHTICAEL: 3386.27 [oz_av]

## 2017-06-25 LAB — MAGNESIUM: Magnesium: 1.9 mg/dL (ref 1.7–2.4)

## 2017-06-25 MED ORDER — CALCITRIOL 0.5 MCG PO CAPS
0.5000 ug | ORAL_CAPSULE | ORAL | Status: DC
  Administered 2017-06-29 – 2017-07-04 (×4): 0.5 ug via ORAL
  Filled 2017-06-25: qty 1

## 2017-06-25 MED ORDER — DEXTROSE 50 % IV SOLN
INTRAVENOUS | Status: AC
  Administered 2017-06-25: 50 mL
  Filled 2017-06-25: qty 50

## 2017-06-25 MED ORDER — HEPARIN BOLUS VIA INFUSION
4000.0000 [IU] | Freq: Once | INTRAVENOUS | Status: AC
  Administered 2017-06-25: 4000 [IU] via INTRAVENOUS
  Filled 2017-06-25: qty 4000

## 2017-06-25 MED ORDER — CHLORHEXIDINE GLUCONATE 0.12 % MT SOLN
15.0000 mL | Freq: Two times a day (BID) | OROMUCOSAL | Status: DC
  Administered 2017-06-25: 15 mL via OROMUCOSAL

## 2017-06-25 MED ORDER — PERFLUTREN LIPID MICROSPHERE
1.0000 mL | INTRAVENOUS | Status: AC | PRN
  Administered 2017-06-25: 2 mL via INTRAVENOUS
  Filled 2017-06-25: qty 10

## 2017-06-25 MED ORDER — SEVELAMER CARBONATE 800 MG PO TABS
1600.0000 mg | ORAL_TABLET | Freq: Three times a day (TID) | ORAL | Status: DC
  Administered 2017-06-25 – 2017-07-04 (×23): 1600 mg via ORAL
  Filled 2017-06-25 (×22): qty 2

## 2017-06-25 MED ORDER — RENA-VITE PO TABS
1.0000 | ORAL_TABLET | Freq: Every day | ORAL | Status: DC
  Administered 2017-06-27 – 2017-07-03 (×8): 1 via ORAL
  Filled 2017-06-25 (×8): qty 1

## 2017-06-25 MED ORDER — SODIUM CHLORIDE 0.9 % IV SOLN
125.0000 mg | INTRAVENOUS | Status: AC
  Administered 2017-06-29 – 2017-07-04 (×3): 125 mg via INTRAVENOUS
  Filled 2017-06-25 (×7): qty 10

## 2017-06-25 MED ORDER — ORAL CARE MOUTH RINSE
15.0000 mL | Freq: Two times a day (BID) | OROMUCOSAL | Status: DC
  Administered 2017-06-25: 15 mL via OROMUCOSAL

## 2017-06-25 NOTE — Progress Notes (Signed)
ANTICOAGULATION CONSULT NOTE   Pharmacy Consult for heparin Indication: chest pain/ACS  Allergies  Allergen Reactions  . Penicillins Anaphylaxis, Swelling and Rash    ANGIOEDEMA/"SWELLING OF ENTIRE BODY" Has patient had a PCN reaction causing immediate rash, facial/tongue/throat swelling, SOB or lightheadedness with hypotension: Yes Has patient had a PCN reaction causing severe rash involving mucus membranes or skin necrosis: No Has patient had a PCN reaction that required hospitalization: No Has patient had a PCN reaction occurring within the last 10 years: No If all of the above answers are "NO", then may proceed with Cephalosporin use.       Labs: Recent Labs    06/24/17 1730 06/24/17 1737 06/24/17 2008 06/25/17 0354 06/25/17 0454  HGB 10.7* 12.9*  --  10.2*  --   HCT 35.3* 38.0*  --  32.3*  --   PLT 392  --   --  353  --   APTT 29  --   --   --   --   LABPROT 13.3  --   --   --   --   INR 1.02  --   --   --   --   HEPARINUNFRC  --   --   --   --  <0.10*  CREATININE 5.43* 5.20*  --   --   --   TROPONINI  --   --  0.64*  --   --     Assessment: 62 yo Richard Barnett presented to ED from HD as a post-CPR and suspected post-seizure. CT negative for acute intracranial abnormalities.  Patient has history of CAD s/p stenting. Patient on DAPT with aspirin and plavix at home. CBC ok.  Initial heparin level <0.1 units/ml.  Heparin was off for ~ 1 hours per RN  Goal of Therapy:  Heparin level 0.3-0.7 units/ml Monitor platelets by anticoagulation protocol: Yes   Plan:  Heparin bolus 4000 units and drip at 1400 units/hr Check heparin in 8 hours  Tadeusz Stahl Poteet 06/25/2017,6:14 AM

## 2017-06-25 NOTE — Progress Notes (Signed)
Initially called by ED provider to admit patient post cardiac arrest (AED shocked him 3 times)  Patient was sent here from hemodialysis center, I discussed case with on-call nephrologist Dr. Signe Colt  Cardiology input appreciated  Subsequently,  I requested that the ED physician contact critical care team, critical care service decided to take patient to their service  Patient will not be admitted to hospitalist service  Shon Hale, MD

## 2017-06-25 NOTE — Progress Notes (Signed)
EEG complete - results pending 

## 2017-06-25 NOTE — Progress Notes (Addendum)
PULMONARY / CRITICAL CARE MEDICINE   Name: Richard Barnett MRN: [id number] DOB: 08-17-1955    ADMISSION DATE:  06/24/2017 CONSULTATION DATE:  06/24/2017  REFERRING MD:  Dr. Fayrene Fearing   CHIEF COMPLAINT:  Cardiac Arrest   HISTORY OF PRESENT ILLNESS:   62 year old Richard Barnett with PMH of CAD s/p STENT 01/2017, ESRD on HD MWF (Began 04/2017), Depression, DM, GERD, HTN  Presents to ED on 4/19 from dialysis center. EMS reports that patient received 3 out of 4 hours of dialysis then was noted to have loss of  consciousness. AED placed. Patient shocked 3 times. Received 10 minutes of CPR before return of ROSC. Upon arrival to ED patient is confused but awake with contusions to tongue. Initial EKG with sinus tachycardia. CT head negative. Cardiology evaluated. Started Heparin for likely NSTEMI. PCCM asked to admit.  Nephrology reports that dialysis center incorrectly set UF to 1L over reported goal.   SUBJECTIVE:  Comfortable this morning although he does still have some midsternal pain, musculoskeletal likely from his CPR. He notes that he cannot remember any of the events from his arrest 4/19  VITAL SIGNS: BP 137/75   Pulse 77   Temp 98.3 F (36.8 C) (Oral)   Resp 11   Ht 5\' 7"  (1.702 m)   Wt 96 kg (211 lb 10.3 oz)   SpO2 99%   BMI 33.15 kg/m   HEMODYNAMICS:    VENTILATOR SETTINGS:    INTAKE / OUTPUT: I/O last 3 completed shifts: In: 200 [IV Piggyback:200] Out: 350 [Urine:350]  PHYSICAL EXAMINATION: General: Well-developed adult gentleman in no distress on room air Neuro: Patient is awake, interacts appropriately but is confused about events from 4/19, oriented only to self.  He cannot identify place or time HEENT: Oropharynx clear, no lesions Cardiovascular: Regular, no murmurs, no edema Lungs: Clear bilaterally, no wheezing, no crackles Abdomen: Obese, soft, nontender, positive bowel sounds Musculoskeletal: No deformities, he is tender to palpation in his peristernal area, no  evidence of fracture or flail chest Skin: No rash  LABS:  BMET Recent Labs  Lab 06/24/17 1730 06/24/17 1737  NA 137 136  K 3.1* 3.2*  CL 94* 92*  CO2 19*  --   BUN 10 12  CREATININE 5.43* 5.20*  GLUCOSE 233* 237*    Electrolytes Recent Labs  Lab 06/24/17 1730  CALCIUM 8.2*  MG 2.2    CBC Recent Labs  Lab 06/24/17 1730 06/24/17 1737 06/25/17 0354  WBC 11.0*  --  7.9  HGB 10.7* 12.9* 10.2*  HCT 35.3* 38.0* 32.3*  PLT 392  --  353    Coag's Recent Labs  Lab 06/24/17 1730  APTT 29  INR 1.02    Sepsis Markers Recent Labs  Lab 06/24/17 2014 06/24/17 2135 06/24/17 2324 06/25/17 0618  LATICACIDVEN 6.10*  --  5.2* 2.6*  PROCALCITON  --  0.21  --   --     ABG No results for input(s): PHART, PCO2ART, PO2ART in the last 168 hours.  Liver Enzymes Recent Labs  Lab 06/24/17 1730  AST 92*  ALT 65*  ALKPHOS 80  BILITOT 0.9  ALBUMIN 3.8    Cardiac Enzymes Recent Labs  Lab 06/24/17 2008  TROPONINI 0.64*    Glucose Recent Labs  Lab 06/25/17 0016 06/25/17 0445 06/25/17 0743  GLUCAP 164* 133* 138*    Imaging Ct Head Wo Contrast  Result Date: 06/24/2017 CLINICAL DATA:  Status post cardiac arrest. Patient arrived from dialysis unresponsive and pulseless. Now having  seizure-like activities. EXAM: CT HEAD WITHOUT CONTRAST TECHNIQUE: Contiguous axial images were obtained from the base of the skull through the vertex without intravenous contrast. COMPARISON:  12/23/2016 head CT FINDINGS: Brain: Chronic moderate small vessel ischemic disease with chronic left basal ganglial lacunar infarct. No acute intracranial hemorrhage, midline shift or edema. Redemonstration of mild to moderate ventriculomegaly. No intra-axial mass nor extra-axial fluid collections. Midline fourth ventricle and basal cisterns. No effacement. No large vascular territory infarct. Vascular: No hyperdense vessel sign.  No unexpected calcifications. Skull: Intact bony calvarium.  Small  left mastoid effusion. Sinuses/Orbits: Mild ethmoid sinus mucosal thickening. No air-fluid levels. Intact orbits and globes. Other: None IMPRESSION: Redemonstration of chronic moderate small vessel ischemic disease and left basal ganglial lacunar infarct. Central atrophy. No acute intracranial abnormality. Electronically Signed   By: Tollie Eth M.D.   On: 06/24/2017 18:30   Dg Chest Portable 1 View  Result Date: 06/24/2017 CLINICAL DATA:  Status post cardiac arrest EXAM: PORTABLE CHEST 1 VIEW COMPARISON:  05/13/2017 FINDINGS: Cardiac shadow is prominent but accentuated by the portable technique. Lungs are well aerated bilaterally. No focal infiltrate or pneumothorax is seen. No acute bony abnormality is noted. IMPRESSION: No acute abnormality noted. Electronically Signed   By: Alcide Clever M.D.   On: 06/24/2017 18:10     STUDIES:  Left heart Cath 01/14/17 >   Ost LAD lesion is 30% stenosed.   Prox LAD lesion is 30% stenosed.  Mid LAD lesion is 30% stenosed.  1st Mrg lesion is 90% stenosed.  Ost 1st Diag lesion is 75% stenosed.  Prox RCA to Mid RCA lesion is 10% stenosed.  LV end diastolic pressure is moderately elevated. 1. Left dominant circulation 2. 2 vessel obstructive CAD    - 75% small diagonal branch    - 90% first OM - this is a small branch that bifurcates in the mid vessel. There is in stent restenosis. The vessel is small < 2.25 mm and disease extends past a bifurcation 3. Moderately elevated LVEDP CT Head 4/19 > Chronic moderate small vessel ischemic disease with chronic left basal ganglial lacunar infarct. No acute intracranial hemorrhage, midline shift or edema. Redemonstration of mild to moderate ventriculomegaly. No intra-axial mass nor extra-axial fluid collections. Midline fourth ventricle and basal cisterns. No effacement. No large vascular territory infarct. CXR 4/19 > Cardiac shadow is prominent but accentuated by the portable technique. Lungs are well aerated  bilaterally. No focal infiltrate or pneumothorax is seen. No acute bony abnormality is noted.  CULTURES: Blood 4/19 >> U/A 4/19 >> not collected  ANTIBIOTICS: None  SIGNIFICANT EVENTS: 4/19 > Presents to ED   LINES/TUBES: Left AVF  PIV   DISCUSSION: 62 year old Richard Barnett with ESRD on HD. Presents from outpatient dialysis center s/p cardiac arrest. Received 3 hours of HD and then became unresponsive and pulseless. Received Shock x3 and 10 minutes CPR before achieving ROSC.  Not intubated. Awake  ASSESSMENT / PLAN:  PULMONARY A: H/O Tobacco Use > Reportedly quit years ago, Previous CT Chest with Emphysematous changes with minimal dependent subpleural ground-glass   P:   Continue pulmonary hygiene Currently tolerating room air No clear indication for scheduled bronchodilators at this time  CARDIOVASCULAR A:  S/P Cardiac Arrest (Reported Defib x3 with 10 minutes CPR)  -Nephrology reports that UF at Dialysis was set incorrectly to one liter above ordered goal; consider this as cause Grade 2 Diastolic Dysfunction (EF 55-60)  Pulmonary HTN (PA Peak 40)  CAD s/p Stents Cath  01/2017 with Mrg Lesion 90% Stenosed, Ost Diag Lesion 75% Stenosis, LVEDP moderately elevated  HTN, HLD   P:  Appreciate cardiology assistance, considering repeat cardiac catheterization Continue telemetry monitoring Echocardiogram ordered and pending Follow troponin trend, most recent 5.9, am 4/20 Continue heparin drip as ordered Aspirin, Plavix, Lipitor Amlodipine currently on hold, restart when blood pressure rebounds  RENAL A:   Anion Gap Metabolic Acidosis with Lactic Acidosis presumed due to hypoperfusion in setting of cardiac arrest  ESRD with HD MWF   Hypokalemia, resolved P:   Appreciate nephrology assistance Hemodialysis per their schedule Follow BMP, electrolytes Replete electrolytes as indicated  GASTROINTESTINAL A:   GERD P:   N.p.o. for now until cardiology recommendations regarding  timing of any intervention Continue Pepcid as ordered  HEMATOLOGIC A:   Anticoagulation needs as above  Anemia of Chronic Disease  P:  Follow CBC on anticoagulation SCD in place  INFECTIOUS A:   No source of infection noted  P:   Following clinically, white blood cell count, fever curve Blood cultures ordered and pending  ENDOCRINE A:   DM   P:   Follow CBG Sliding scale insulin as ordered  NEUROLOGIC A:   Encephalopathy presumed in setting of hypoperfusion s/p cardiac arrest  H/O Depression, SI documented on 3/16 Admission  P:   Suspect confusion due to transient hypoperfusion due to arrest.  Continue to reorient EEG requested and pending, no current evidence for seizure activity Zoloft is on hold, restart when he stabilizes   FAMILY  - Updates: Daughter updated via phone 4/19.   - Inter-disciplinary family meet or Palliative Care meeting due by: 07/01/2017  I will ask [facility] to assume his care as of 4/21.  PCCM will sign off at that time  Levy Pupa, MD, PhD 06/25/2017, 8:46 AM [facility] [phone] or if no answer [phone]

## 2017-06-25 NOTE — Progress Notes (Addendum)
[facility] Progress Note   Subjective:  Seen in room. Alert,conversant. Doesn't remember what happened yesterday. C/os chest sore "all over"  from CPR. Hungry wants to eat.   Objective Vitals:   06/25/17 0700 06/25/17 0800 06/25/17 0900 06/25/17 1000  BP: 137/75 (!) 135/94 139/83 139/82  Pulse: 77 78 76 80  Resp: 11 10 15 17   Temp:  98.1 F (36.7 C)    TempSrc:  Oral    SpO2: 99% 99% 97% 99%  Weight:      Height:       Physical Exam General: WNWD Kipling comfortable in bed  Heart: RRR Lungs: CTAB Abdomen: BS+ soft Extremities: No LE edema  Dialysis Access: LUE AVG +bruit   Dialysis Orders:  [facility] 4 hours EDW 98 kg MWF HD Bath 2K/ 2 Ca, Dialyzer F180 BFR 400 mL/ min, Heparin 5000 u bolus. Access LUE AVG. Mircera 150 mcg q 2 weeks, last given 4/10 Venofer 100 mg q 5 rx, started 4/17 Calcitriol 0.5 mcg q rx   Assessment/Plan: 1. Cardiac arrest: possibly due to aggressive set UF goal at HD (if UFR greater than that of plasma refill, could cause hypotension and cardiac arrest) VS cardiac VS other.  Unclear what presenting rhythm was.  Pt AAO x 3 now, on hep gtt, on RA.  Per primary 2. ESRD: MWF No urgent HD needs. Plan next HD 4/22 on schedule  3 Hypertension: BP controlled on amlodipine, metoprolol, imdur per OP dialysis med list 4. Anemia of ESRD: not due for ESA yet, will continue Fe load with next HD 5. Metabolic Bone Disease: Calcitriol 0.5 q rx, takes renagel 2 tabs TID AC 6.  Depression: on Zoloft 50 mg  7.  Dispo: ICU  Tomasa Blase PA-C [facility] Pager [phone] 06/25/2017,12:04 PM  LOS: 1 day   Pt seen, examined and agree w A/P as above.  Vinson Moselle MD [facility] pager [phone]   06/25/2017, 12:31 PM   Additional Objective Labs: Basic Metabolic Panel: Recent Labs  Lab 06/24/17 1730 06/24/17 1737 06/25/17 0550 06/25/17 0940  NA 137 136 135 137  K 3.1* 3.2* 4.0 3.7  CL 94* 92* 95* 96*  CO2  19*  --  24 26  GLUCOSE 233* 237* 130* 116*  BUN 10 12 16 16   CREATININE 5.43* 5.20* 6.28* 6.65*  CALCIUM 8.2*  --  8.5* 8.6*  PHOS  --   --  2.3*  --    CBC: Recent Labs  Lab 06/24/17 1730 06/24/17 1737 06/25/17 0354  WBC 11.0*  --  7.9  HGB 10.7* 12.9* 10.2*  HCT 35.3* 38.0* 32.3*  MCV 95.9  --  93.6  PLT 392  --  353   Blood Culture No results found for: SDES, SPECREQUEST, CULT, REPTSTATUS  Cardiac Enzymes: Recent Labs  Lab 06/24/17 2008 06/25/17 0550 06/25/17 0940  TROPONINI 0.64* 5.90* 5.51*   CBG: Recent Labs  Lab 06/25/17 0016 06/25/17 0445 06/25/17 0743  GLUCAP 164* 133* 138*   Iron Studies: No results for input(s): IRON, TIBC, TRANSFERRIN, FERRITIN in the last 72 hours. Lab Results  Component Value Date   INR 1.02 06/24/2017   INR 0.98 05/13/2017   INR 0.99 01/14/2017   Medications: . sodium chloride    . heparin 1,400 Units/hr (06/25/17 1000)   . aspirin EC  81 mg Oral Daily  . atorvastatin  80 mg Oral q1800  . chlorhexidine  15 mL Mouth Rinse BID  . clopidogrel  75 mg Oral Daily  . famotidine  20 mg Oral Daily  . insulin aspart  0-15 Units Subcutaneous Q4H  . lidocaine  1 patch Transdermal Q24H  . mouth rinse  15 mL Mouth Rinse q12n4p

## 2017-06-25 NOTE — Progress Notes (Signed)
ANTICOAGULATION CONSULT NOTE  Pharmacy Consult for heparin Indication: chest pain/ACS  Allergies  Allergen Reactions  . Penicillins Anaphylaxis, Swelling and Rash    ANGIOEDEMA/"SWELLING OF ENTIRE BODY" Has patient had a PCN reaction causing immediate rash, facial/tongue/throat swelling, SOB or lightheadedness with hypotension: Yes Has patient had a PCN reaction causing severe rash involving mucus membranes or skin necrosis: No Has patient had a PCN reaction that required hospitalization: No Has patient had a PCN reaction occurring within the last 10 years: No If all of the above answers are "NO", then may proceed with Cephalosporin use.    Patient Measurements: Height: 5\' 7"  (170.2 cm) Weight: 211 lb 10.3 oz (96 kg) IBW/kg (Calculated) : 66.1 Heparin Dosing Weight: 87.2 kg  Vital Signs: Temp: 98.1 F (36.7 C) (04/20 0800) Temp Source: Oral (04/20 0800) BP: 138/83 (04/20 1300) Pulse Rate: 79 (04/20 1300)  Labs: Recent Labs    06/24/17 1730 06/24/17 1737 06/24/17 2008 06/25/17 0354 06/25/17 0454 06/25/17 0550 06/25/17 0940 06/25/17 1408  HGB 10.7* 12.9*  --  10.2*  --   --   --   --   HCT 35.3* 38.0*  --  32.3*  --   --   --   --   PLT 392  --   --  353  --   --   --   --   APTT 29  --   --   --   --   --   --   --   LABPROT 13.3  --   --   --   --   --   --   --   INR 1.02  --   --   --   --   --   --   --   HEPARINUNFRC  --   --   --   --  <0.10*  --   --  0.91*  CREATININE 5.43* 5.20*  --   --   --  6.28* 6.65*  --   TROPONINI  --   --  0.64*  --   --  5.90* 5.51*  --    Estimated Creatinine Clearance: 12.7 mL/min (A) (by C-G formula based on SCr of 6.65 mg/dL (H)).  Medical History: Past Medical History:  Diagnosis Date  . Arthritis    "right leg" (08/22/2014)  . CAD (coronary artery disease)    a. CAD s/p 2 stent placement in [place] 2016. b. Neg nuc 05/2014 & 01/2016.  Marland Kitchen CKD (chronic kidney disease), stage IV (HCC)    Richard Barnett 08/22/2014  . Daily headache    . Depression    "I always get that" (08/22/2014)  . Diabetes mellitus (HCC)   . DVT (deep venous thrombosis) (HCC) ?   RLE  . GERD (gastroesophageal reflux disease)   . Hypercholesterolemia   . Hypertension    Medications:  Scheduled:  . aspirin EC  81 mg Oral Daily  . atorvastatin  80 mg Oral q1800  . [START ON 06/27/2017] calcitRIOL  0.5 mcg Oral Q M,W,F-HD  . clopidogrel  75 mg Oral Daily  . famotidine  20 mg Oral Daily  . insulin aspart  0-15 Units Subcutaneous Q4H  . lidocaine  1 patch Transdermal Q24H  . multivitamin  1 tablet Oral QHS  . sevelamer carbonate  1,600 mg Oral TID WC   Assessment: 62 yo Richard Barnett presented to ED from HD as a post-CPR and suspected post-seizure. CT negative for acute intracranial abnormalities.  Patient  has history of CAD s/p stenting. Patient on DAPT with aspirin and plavix at home.  Heparin level above goal: 0.91, RN reports dried blood around graft site. Provider is aware and will continue to monitor for new bleeding.   Goal of Therapy:  Heparin level 0.3-0.7 units/ml Monitor platelets by anticoagulation protocol: Yes   Plan:  Reduce heparin infusion to 1200 units/hr Check anti-Xa level in 8 hours and daily while on heparin Continue to monitor H&H and platelets  Richard Barnett 06/25/2017,2:46 PM

## 2017-06-25 NOTE — Progress Notes (Signed)
Progress Note  Patient Name: Richard Barnett Date of Encounter: 06/25/2017  Primary Cardiologist: Dr. Sharol Roussel, [facility] [place]  Subjective   Chest wall sore but no active angina.  Not short of breath at rest.  No palpitations.  Hungry and wants to eat.  Not recall any details about the events of yesterday.  Inpatient Medications    Scheduled Meds: . aspirin EC  81 mg Oral Daily  . atorvastatin  80 mg Oral q1800  . [START ON 06/27/2017] calcitRIOL  0.5 mcg Oral Q M,W,F-HD  . chlorhexidine  15 mL Mouth Rinse BID  . clopidogrel  75 mg Oral Daily  . famotidine  20 mg Oral Daily  . insulin aspart  0-15 Units Subcutaneous Q4H  . lidocaine  1 patch Transdermal Q24H  . mouth rinse  15 mL Mouth Rinse q12n4p  . multivitamin  1 tablet Oral QHS  . sevelamer carbonate  1,600 mg Oral TID WC   Continuous Infusions: . sodium chloride    . [START ON 06/27/2017] ferric gluconate (FERRLECIT/NULECIT) IV    . heparin 1,400 Units/hr (06/25/17 1000)   PRN Meds: sodium chloride, ketorolac, perflutren lipid microspheres (DEFINITY) IV suspension   Vital Signs    Vitals:   06/25/17 0900 06/25/17 1000 06/25/17 1100 06/25/17 1200  BP: 139/83 139/82 127/79 138/82  Pulse: 76 80 79 75  Resp: 15 17 11 12   Temp:      TempSrc:      SpO2: 97% 99% 98% 99%  Weight:      Height:        Intake/Output Summary (Last 24 hours) at 06/25/2017 1255 Last data filed at 06/25/2017 0900 Gross per 24 hour  Intake 200 ml  Output 850 ml  Net -650 ml   Filed Weights   06/24/17 1731 06/24/17 2000 06/24/17 2200  Weight: 216 lb (98 kg) 216 lb 0.8 oz (98 kg) 211 lb 10.3 oz (96 kg)    Telemetry    Sinus rhythm.  Personally reviewed.  ECG    Tracing from 06/24/2017 shows sinus rhythm with nonspecific ST changes and borderline prolonged QT interval.  Personally reviewed.  Physical Exam   GEN: No acute distress.   Neck: No JVD. Cardiac: RRR, no gallop.  Respiratory: Nonlabored. Clear to  auscultation bilaterally. GI: Soft, nontender, bowel sounds present. MS:  Left upper extremity AV graft with bruit. Neuro:  Nonfocal. Psych: Alert and oriented x 1. Normal affect.  Labs    Chemistry Recent Labs  Lab 06/24/17 1730 06/24/17 1737 06/25/17 0550 06/25/17 0940  NA 137 136 135 137  K 3.1* 3.2* 4.0 3.7  CL 94* 92* 95* 96*  CO2 19*  --  24 26  GLUCOSE 233* 237* 130* 116*  BUN 10 12 16 16   CREATININE 5.43* 5.20* 6.28* 6.65*  CALCIUM 8.2*  --  8.5* 8.6*  PROT 7.6  --   --   --   ALBUMIN 3.8  --   --   --   AST 92*  --   --   --   ALT 65*  --   --   --   ALKPHOS 80  --   --   --   BILITOT 0.9  --   --   --   GFRNONAA 10*  --  9* 8*  GFRAA 12*  --  10* 9*  ANIONGAP 24*  --  16* 15     Hematology Recent Labs  Lab 06/24/17 1730 06/24/17 1737  06/25/17 0354  WBC 11.0*  --  7.9  RBC 3.68*  --  3.45*  HGB 10.7* 12.9* 10.2*  HCT 35.3* 38.0* 32.3*  MCV 95.9  --  93.6  MCH 29.1  --  29.6  MCHC 30.3  --  31.6  RDW 15.8*  --  15.9*  PLT 392  --  353    Cardiac Enzymes Recent Labs  Lab 06/24/17 2008 06/25/17 0550 06/25/17 0940  TROPONINI 0.64* 5.90* 5.51*    Recent Labs  Lab 06/24/17 1736 06/24/17 2012  TROPIPOC 0.03 0.75*     Radiology    Ct Head Wo Contrast  Result Date: 06/24/2017 CLINICAL DATA:  Status post cardiac arrest. Patient arrived from dialysis unresponsive and pulseless. Now having seizure-like activities. EXAM: CT HEAD WITHOUT CONTRAST TECHNIQUE: Contiguous axial images were obtained from the base of the skull through the vertex without intravenous contrast. COMPARISON:  12/23/2016 head CT FINDINGS: Brain: Chronic moderate small vessel ischemic disease with chronic left basal ganglial lacunar infarct. No acute intracranial hemorrhage, midline shift or edema. Redemonstration of mild to moderate ventriculomegaly. No intra-axial mass nor extra-axial fluid collections. Midline fourth ventricle and basal cisterns. No effacement. No large vascular  territory infarct. Vascular: No hyperdense vessel sign.  No unexpected calcifications. Skull: Intact bony calvarium.  Small left mastoid effusion. Sinuses/Orbits: Mild ethmoid sinus mucosal thickening. No air-fluid levels. Intact orbits and globes. Other: None IMPRESSION: Redemonstration of chronic moderate small vessel ischemic disease and left basal ganglial lacunar infarct. Central atrophy. No acute intracranial abnormality. Electronically Signed   By: Tollie Eth M.D.   On: 06/24/2017 18:30   Dg Chest Portable 1 View  Result Date: 06/24/2017 CLINICAL DATA:  Status post cardiac arrest EXAM: PORTABLE CHEST 1 VIEW COMPARISON:  05/13/2017 FINDINGS: Cardiac shadow is prominent but accentuated by the portable technique. Lungs are well aerated bilaterally. No focal infiltrate or pneumothorax is seen. No acute bony abnormality is noted. IMPRESSION: No acute abnormality noted. Electronically Signed   By: Alcide Clever M.D.   On: 06/24/2017 18:10    Cardiac Studies   Echocardiogram pending.  Patient Profile     62 y.o. Richard Barnett with a history of ESRD on hemodialysis, depression, type 2 diabetes mellitus, hypertension, hyperlipidemia, and CAD with previous stent intervention to the obtuse marginal with cardiac catheterization in November 2018 demonstrating first diagonal and first obtuse marginal stenoses that were managed medically in light of relatively small branches.  He presents after reported cardiac arrest in hemodialysis, treated with chest compressions and AED with reportedly 3 shocks delivered although actual rhythm at that time is uncertain based on incomplete information.  Patient reported to be in sinus tachycardia with pulse on EMS arrival and did not require intubation.  He recalls no events from around that time.  Assessment & Plan    1.  Status post reported cardiac arrest in hemodialysis, etiology is not certain.  Rhythm at the time also not certain.  He is in sinus rhythm by telemetry with no  arrhythmias documented so far.  Troponin I levels are elevated at 5.9 and 5.5 consistent with NSTEMI, but this could be a type II event rather than necessarily indicating an ischemic etiology.  Echocardiogram is pending.  2.  CAD with previous stent intervention to the obtuse marginal with first diagonal and first obtuse marginal disease documented as recently as November 2018, managed medically due to small vessel size.  He does report intermittent angina symptoms.  3.  ESRD on hemodialysis.  4.  Essential hypertension.  Blood pressure stable.  5.  Mixed hyperlipidemia, on Lipitor as an outpatient.  Continue to follow telemetry.  We will follow-up on echocardiogram for assessment of LVEF. He remains somewhat confused, oriented only to person at this time.  EEG is pending.  CT without acute findings but did show chronic small vessel ischemic disease and left basal ganglia lacunar infarct.  Likely with encephalopathy related to hypoperfusion, hopefully will show continued clearing.  As he returns more to baseline we can discuss diagnostic cardiac catheterization to clarify stability in coronary anatomy in light of his apparent arrest presentation without other obvious etiology.  Signed, Nona Dell, MD  06/25/2017, 12:55 PM

## 2017-06-25 NOTE — Plan of Care (Signed)
62 year old Richard Barnett admitted 05/24/2017 to P CCM status post cardiac arrest at dialysis treated with CPR and shock x3.  Patient had elevated troponin levels at 5.9 peak.  Cardiology following.  Echocardiogram to be done today.  He had contusions to tongue at the time of arrival to ER.  EEG pending.  CT head negative for any acute changes but does show chronic lacunar infarct.  Patient currently on heparin drip.  There was a concern of taking too much fluid out while at dialysis which probably would have set him up to cardiac arrest.  TRH to pick him up to 06/26/2017.

## 2017-06-25 NOTE — Procedures (Signed)
Date of recording 06/25/2017  Referring physician Dr. Smith Robert  Reason for the study Unresponsive/syncope  Technique: Digital EEG recording using 10-20 international electrode system  Description of the recording And awake posterior dominant rhythm is 8 Hz symmetrically reactive and well sustained.  Sleep was not obtained. Epileptiform features were not seen during this recording.  Impression The EEG is abnormal and findings are very mild generalized cerebral dysfunction due to background slowing.  Epileptiform features were not seen during this recording.

## 2017-06-26 ENCOUNTER — Other Ambulatory Visit: Payer: Self-pay

## 2017-06-26 LAB — URINALYSIS, ROUTINE W REFLEX MICROSCOPIC
BILIRUBIN URINE: NEGATIVE
Glucose, UA: NEGATIVE mg/dL
Ketones, ur: NEGATIVE mg/dL
LEUKOCYTES UA: NEGATIVE
NITRITE: NEGATIVE
SPECIFIC GRAVITY, URINE: 1.007 (ref 1.005–1.030)
pH: 8 (ref 5.0–8.0)

## 2017-06-26 LAB — GLUCOSE, CAPILLARY
GLUCOSE-CAPILLARY: 117 mg/dL — AB (ref 65–99)
GLUCOSE-CAPILLARY: 134 mg/dL — AB (ref 65–99)
GLUCOSE-CAPILLARY: 111 mg/dL — AB (ref 65–99)
GLUCOSE-CAPILLARY: 87 mg/dL (ref 65–99)
Glucose-Capillary: 117 mg/dL — ABNORMAL HIGH (ref 65–99)
Glucose-Capillary: 100 mg/dL — ABNORMAL HIGH (ref 65–99)
Glucose-Capillary: 114 mg/dL — ABNORMAL HIGH (ref 65–99)

## 2017-06-26 LAB — BASIC METABOLIC PANEL
Anion gap: 14 (ref 5–15)
BUN: 24 mg/dL — AB (ref 6–20)
CHLORIDE: 95 mmol/L — AB (ref 101–111)
CO2: 26 mmol/L (ref 22–32)
CREATININE: 7.8 mg/dL — AB (ref 0.61–1.24)
Calcium: 8.3 mg/dL — ABNORMAL LOW (ref 8.9–10.3)
GFR calc Af Amer: 8 mL/min — ABNORMAL LOW (ref >60)
GFR calc non Af Amer: 7 mL/min — ABNORMAL LOW (ref >60)
GLUCOSE: 151 mg/dL — AB (ref 65–99)
Potassium: 3.4 mmol/L — ABNORMAL LOW (ref 3.5–5.1)
SODIUM: 135 mmol/L (ref 135–145)

## 2017-06-26 LAB — PROCALCITONIN: PROCALCITONIN: 0.97 ng/mL

## 2017-06-26 LAB — CBC
HCT: 30.7 % — ABNORMAL LOW (ref 39.0–52.0)
Hemoglobin: 9.7 g/dL — ABNORMAL LOW (ref 13.0–17.0)
MCH: 29.4 pg (ref 26.0–34.0)
MCHC: 31.6 g/dL (ref 30.0–36.0)
MCV: 93 fL (ref 78.0–100.0)
PLATELETS: 346 10*3/uL (ref 150–400)
RBC: 3.3 MIL/uL — ABNORMAL LOW (ref 4.22–5.81)
RDW: 16.3 % — ABNORMAL HIGH (ref 11.5–15.5)
WBC: 9.2 10*3/uL (ref 4.0–10.5)

## 2017-06-26 LAB — HEPARIN LEVEL (UNFRACTIONATED)
HEPARIN UNFRACTIONATED: 0.24 [IU]/mL — AB (ref 0.30–0.70)
Heparin Unfractionated: 0.35 IU/mL (ref 0.30–0.70)
Heparin Unfractionated: 0.37 IU/mL (ref 0.30–0.70)

## 2017-06-26 LAB — PHOSPHORUS: Phosphorus: 4.2 mg/dL (ref 2.5–4.6)

## 2017-06-26 LAB — LACTIC ACID, PLASMA: LACTIC ACID, VENOUS: 2.3 mmol/L — AB (ref 0.5–1.9)

## 2017-06-26 LAB — TROPONIN I: TROPONIN I: 3.17 ng/mL — AB (ref <0.03)

## 2017-06-26 LAB — MAGNESIUM: MAGNESIUM: 1.9 mg/dL (ref 1.7–2.4)

## 2017-06-26 MED ORDER — HEPARIN SODIUM (PORCINE) 1000 UNIT/ML DIALYSIS
5000.0000 [IU] | Freq: Once | INTRAMUSCULAR | Status: DC
  Filled 2017-06-26: qty 5

## 2017-06-26 MED ORDER — SODIUM CHLORIDE 0.9 % IV BOLUS
1000.0000 mL | Freq: Once | INTRAVENOUS | Status: AC
  Administered 2017-06-26: 1000 mL via INTRAVENOUS

## 2017-06-26 MED ORDER — METOPROLOL TARTRATE 12.5 MG HALF TABLET
12.5000 mg | ORAL_TABLET | Freq: Two times a day (BID) | ORAL | Status: DC
  Administered 2017-06-26 – 2017-07-04 (×14): 12.5 mg via ORAL
  Filled 2017-06-26 (×14): qty 1

## 2017-06-26 MED ORDER — INSULIN ASPART 100 UNIT/ML ~~LOC~~ SOLN
0.0000 [IU] | Freq: Three times a day (TID) | SUBCUTANEOUS | Status: DC
  Administered 2017-06-27 – 2017-07-03 (×4): 2 [IU] via SUBCUTANEOUS

## 2017-06-26 NOTE — Progress Notes (Addendum)
[facility] Progress Note   Subjective:  Seen in room. Alert,conversant. Hard time remembering recent events.   Objective Vitals:   06/26/17 0700 06/26/17 0800 06/26/17 0900 06/26/17 1000  BP: (!) 87/76 105/74 101/72 (!) 147/94  Pulse: 77 77 69 77  Resp: 13 12 13 13   Temp:  98 F (36.7 C)    TempSrc:  Oral    SpO2: 98% 95% 100% 96%  Weight:      Height:       Physical Exam General: WNWD Lem comfortable in bed  Heart: RRR Lungs: CTAB Abdomen: BS+ soft Extremities: No LE edema  Dialysis Access: LUE AVG +bruit    CXR 4/19 in ED after arrest > no acute disease, no edema  Dialysis:  [facility] M[facility] 4h  98kg  2/2 bath  400/800   Hep 5000  LUE AVG Mircera 150 mcg q 2 weeks, last given 4/10 Venofer 100 mg q 5 rx, started 4/17 Calcitriol 0.5 mcg q rx    Assessment/Plan: 1. Cardiac arrest: cardiology evaluating, possible heart cath. +hx CAD w/ cardiac stent x 1. Last cath Nov 2018, not severe disease, see cardiology notes. No intervention then.  Normal EF last echo Nov 18.  Also need to call the HD unit on Monday and see what the underlying "shockable" rhythm was from the arrest.  2  ESRD: MWF. Plan next HD 4/22 on schedule 3  HTN/ Volume: BP's are soft to normal, home norvasc/ metoprolol/ imdur all on hold.  Await echo results.  No vol excess on exam, might be dry. Will give NS bolus and take no fluid off w next HD.  May need to let edw come up to ~ 100kg (just started HD 2 mos ago).   4. Anemia of ESRD: not due for ESA yet, will continue Fe load with next HD 5. Metabolic Bone Disease: Calcitriol 0.5 q rx, takes renagel 2 tabs TID AC 6. Depression: here for suicidal ideation in March 2019; on Zoloft 50 mg   Vinson Moselle MD [facility] pager [phone]   06/26/2017, 11:15 AM   Additional Objective Labs: Basic Metabolic Panel: Recent Labs  Lab 06/25/17 0550 06/25/17 0940 06/26/17 0048  NA 135 137 135  K 4.0 3.7 3.4*  CL 95* 96* 95*  CO2 24  26 26   GLUCOSE 130* 116* 151*  BUN 16 16 24*  CREATININE 6.28* 6.65* 7.80*  CALCIUM 8.5* 8.6* 8.3*  PHOS 2.3*  --  4.2   CBC: Recent Labs  Lab 06/24/17 1730 06/24/17 1737 06/25/17 0354 06/26/17 0048  WBC 11.0*  --  7.9 9.2  HGB 10.7* 12.9* 10.2* 9.7*  HCT 35.3* 38.0* 32.3* 30.7*  MCV 95.9  --  93.6 93.0  PLT 392  --  353 346   Blood Culture    Component Value Date/Time   SDES BLOOD RIGHT ARM 06/24/2017 2157   SPECREQUEST  06/24/2017 2157    BOTTLES DRAWN AEROBIC AND ANAEROBIC Blood Culture results may not be optimal due to an inadequate volume of blood received in culture bottles   CULT  06/24/2017 2157    NO GROWTH < 24 HOURS Performed at [facility], [address], [place], [place] [address]    REPTSTATUS PENDING 06/24/2017 2157    Cardiac Enzymes: Recent Labs  Lab 06/24/17 2008 06/25/17 0550 06/25/17 0940 06/26/17 0048  TROPONINI 0.64* 5.90* 5.51* 3.17*   CBG: Recent Labs  Lab 06/25/17 1650 06/25/17 2001 06/25/17 2343 06/26/17 1517 06/26/17 6160  GLUCAP 109* 116* 131* 117* 117*   Iron Studies: No results for input(s): IRON, TIBC, TRANSFERRIN, FERRITIN in the last 72 hours. Lab Results  Component Value Date   INR 1.02 06/24/2017   INR 0.98 05/13/2017   INR 0.99 01/14/2017   Medications: . sodium chloride    . [START ON 06/27/2017] ferric gluconate (FERRLECIT/NULECIT) IV    . heparin 1,250 Units/hr (06/26/17 0900)   . aspirin EC  81 mg Oral Daily  . atorvastatin  80 mg Oral q1800  . [START ON 06/27/2017] calcitRIOL  0.5 mcg Oral Q M,W,F-HD  . clopidogrel  75 mg Oral Daily  . famotidine  20 mg Oral Daily  . insulin aspart  0-15 Units Subcutaneous Q4H  . lidocaine  1 patch Transdermal Q24H  . multivitamin  1 tablet Oral QHS  . sevelamer carbonate  1,600 mg Oral TID WC

## 2017-06-26 NOTE — Progress Notes (Signed)
ANTICOAGULATION CONSULT NOTE  Pharmacy Consult for heparin Indication: chest pain/ACS  Labs: Recent Labs    06/24/17 1730 06/24/17 1737  06/25/17 0354 06/25/17 0454 06/25/17 0550 06/25/17 0940 06/25/17 1408 06/26/17 0048  HGB 10.7* 12.9*  --  10.2*  --   --   --   --  9.7*  HCT 35.3* 38.0*  --  32.3*  --   --   --   --  30.7*  PLT 392  --   --  353  --   --   --   --  346  APTT 29  --   --   --   --   --   --   --   --   LABPROT 13.3  --   --   --   --   --   --   --   --   INR 1.02  --   --   --   --   --   --   --   --   HEPARINUNFRC  --   --   --   --  <0.10*  --   --  0.91* 0.24*  CREATININE 5.43* 5.20*  --   --   --  6.28* 6.65*  --  7.80*  TROPONINI  --   --    < >  --   --  5.90* 5.51*  --  3.17*   < > = values in this interval not displayed.     Assessment: 62 yo Richard Barnett presented to ED from HD as a post-CPR and suspected post-seizure. CT negative for acute intracranial abnormalities.  Patient has history of CAD s/p stenting. Patient on DAPT with aspirin and plavix at home.  Heparin level now 0.24 units/hr.   Goal of Therapy:  Heparin level 0.3-0.7 units/ml Monitor platelets by anticoagulation protocol: Yes   Plan:  Increase heparin infusion to 1250 units/hr Check anti-Xa level in 8 hours and daily while on heparin Continue to monitor H&H and platelets  Shataria Crist Poteet 06/26/2017,3:20 AM

## 2017-06-26 NOTE — Progress Notes (Signed)
Report has been given to the nurse on . Pt is stable, vitals are WNLs.

## 2017-06-26 NOTE — Progress Notes (Signed)
eLink Physician-Brief Progress Note Patient Name: Richard Barnett DOB: [birth year] MRN: [id number]   Date of Service  06/26/2017  HPI/Events of Note  K+ = 3.4 and Creatinine = 7.8  eICU Interventions  Will not replace K+ as patient is ESRD o HD. Defer electrolyte management to Nephrology.      Intervention Category Major Interventions: Electrolyte abnormality - evaluation and management  Ryliegh Mcduffey Eugene 06/26/2017, 5:38 AM

## 2017-06-26 NOTE — Progress Notes (Signed)
ANTICOAGULATION CONSULT NOTE  Pharmacy Consult for heparin Indication: chest pain/ACS  Labs: Recent Labs    06/24/17 1730 06/24/17 1737  06/25/17 0354  06/25/17 0550 06/25/17 0940  06/26/17 0048 06/26/17 0725 06/26/17 1149  HGB 10.7* 12.9*  --  10.2*  --   --   --   --  9.7*  --   --   HCT 35.3* 38.0*  --  32.3*  --   --   --   --  30.7*  --   --   PLT 392  --   --  353  --   --   --   --  346  --   --   APTT 29  --   --   --   --   --   --   --   --   --   --   LABPROT 13.3  --   --   --   --   --   --   --   --   --   --   INR 1.02  --   --   --   --   --   --   --   --   --   --   HEPARINUNFRC  --   --   --   --    < >  --   --    < > 0.24* 0.35 0.37  CREATININE 5.43* 5.20*  --   --   --  6.28* 6.65*  --  7.80*  --   --   TROPONINI  --   --    < >  --   --  5.90* 5.51*  --  3.17*  --   --    < > = values in this interval not displayed.     Assessment: 62 yo Prateek presented to ED from HD as a post-CPR and suspected post-seizure. CT negative for acute intracranial abnormalities.  Patient has history of CAD s/p stenting. Patient on DAPT with aspirin and plavix at home.  Heparin level therapeutic 0.37. Hgb trending down, no bleeding reported.   Goal of Therapy:  Heparin level 0.3-0.7 units/ml Monitor platelets by anticoagulation protocol: Yes   Plan:  Heparin IV infusion 1250 units/hr Daily heparin levels and CBC Continue to monitor H&H and platelets  Adline Potter, PharmD Pharmacy Resident Pager: [phone]

## 2017-06-26 NOTE — Progress Notes (Signed)
PROGRESS NOTE    Richard Barnett  OZH:[id number] DOB: Jan 02, 1956 DOA: 06/24/2017 PCP: Patient, No Pcp Per     Brief Narrative:  Richard Barnett is a 62 year old Richard Barnett with PMH of CAD s/p stent 01/2017, ESRD on HD MWF (began 04/2017), depression, DM, GERD, HTN who presented to ED on 4/19 from dialysis center. EMS reports that patient received 3 out of 4 hours of dialysis then was noted to have loss of  consciousness. AED placed. Patient shocked 3 times. Received 10 minutes of CPR before return of ROSC. Upon arrival to ED patient is confused but awake with contusions to tongue. Initial EKG with sinus tachycardia. CT head negative. Patient was admitted to ICU service with cardiology and nephrology consultation. Transferred to [facility] 4/21.   Assessment & Plan:   Active Problems:   Cardiac arrest [facility])  Status post cardiac arrest -Etiology not clear -Cardiology following, possible consideration of heart cath  NSTEMI -Troponin peak of 5.9 but unclear if precipitating event for arrest vs demand ischemia resulting from arrest -Remains on heparin gtt   Chronic diastolic dysfunction -Echo EF 62%, grade 1 dd   CAD status post stents -Continue aspirin, Plavix, Lipitor, lopressor   DM type 2 -SSI   ESRD on hemodialysis MWF -Nephrology following   Acute metabolic encephalopathy -Could be secondary to transient hypoperfusion after cardiac arrest -EEG with abnormal showing very mild generalized cerebral dysfunction with background slowing but no epileptiform features  Musculoskeletal CP -S/p CPR -Toradol and lidoderm patch    DVT prophylaxis: Heparin gtt Code Status: Full Family Communication: No family at bedside Disposition Plan: Pending cardiology work up    Consultants:   PCCM admission  Nephrology  Cardiology  Antimicrobials:  Anti-infectives (From admission, onward)   None       Subjective: Complaining of central chest pain that has been present since admission. He is  sitting in chair, eating breakfast. Denies N/V/D/abdominal pain   Objective: Vitals:   06/26/17 1200 06/26/17 1300 06/26/17 1400 06/26/17 1427  BP: (!) 142/87 128/83 (!) 149/85 (!) 145/85  Pulse: 74 76 76 73  Resp: 16 16 20 18   Temp: 98.1 F (36.7 C)     TempSrc: Oral     SpO2: 98% 99% 100% 100%  Weight:      Height:        Intake/Output Summary (Last 24 hours) at 06/26/2017 1442 Last data filed at 06/26/2017 1300 Gross per 24 hour  Intake 280.79 ml  Output 1180 ml  Net -899.21 ml   Filed Weights   06/24/17 2000 06/24/17 2200 06/26/17 0500  Weight: 98 kg (216 lb 0.8 oz) 96 kg (211 lb 10.3 oz) 97.6 kg (215 lb 2.7 oz)    Examination:  General exam: Appears calm and comfortable  Respiratory system: Clear to auscultation. Respiratory effort normal. Cardiovascular system: S1 & S2 heard, RRR. No JVD, murmurs, rubs, gallops or clicks. No pedal edema. Gastrointestinal system: Abdomen is nondistended, soft and nontender. No organomegaly or masses felt. Normal bowel sounds heard. Central nervous system: Alert and oriented. No focal neurological deficits. Extremities: Symmetric 5 x 5 power. Skin: No rashes, lesions or ulcers Psychiatry: Judgement and insight appear stable but mildly confused   Data Reviewed: I have personally reviewed following labs and imaging studies  CBC: Recent Labs  Lab 06/24/17 1730 06/24/17 1737 06/25/17 0354 06/26/17 0048  WBC 11.0*  --  7.9 9.2  HGB 10.7* 12.9* 10.2* 9.7*  HCT 35.3* 38.0* 32.3* 30.7*  MCV  95.9  --  93.6 93.0  PLT 392  --  353 346   Basic Metabolic Panel: Recent Labs  Lab 06/24/17 1730 06/24/17 1737 06/25/17 0550 06/25/17 0940 06/26/17 0048  NA 137 136 135 137 135  K 3.1* 3.2* 4.0 3.7 3.4*  CL 94* 92* 95* 96* 95*  CO2 19*  --  24 26 26   GLUCOSE 233* 237* 130* 116* 151*  BUN 10 12 16 16  24*  CREATININE 5.43* 5.20* 6.28* 6.65* 7.80*  CALCIUM 8.2*  --  8.5* 8.6* 8.3*  MG 2.2  --  1.9  --  1.9  PHOS  --   --  2.3*  --  4.2    GFR: Estimated Creatinine Clearance: 10.9 mL/min (A) (by C-G formula based on SCr of 7.8 mg/dL (H)). Liver Function Tests: Recent Labs  Lab 06/24/17 1730  AST 92*  ALT 65*  ALKPHOS 80  BILITOT 0.9  PROT 7.6  ALBUMIN 3.8   No results for input(s): LIPASE, AMYLASE in the last 168 hours. No results for input(s): AMMONIA in the last 168 hours. Coagulation Profile: Recent Labs  Lab 06/24/17 1730  INR 1.02   Cardiac Enzymes: Recent Labs  Lab 06/24/17 2008 06/25/17 0550 06/25/17 0940 06/26/17 0048  TROPONINI 0.64* 5.90* 5.51* 3.17*   BNP (last 3 results) No results for input(s): PROBNP in the last 8760 hours. HbA1C: Recent Labs    06/25/17 0354  HGBA1C 4.9   CBG: Recent Labs  Lab 06/25/17 2343 06/26/17 0429 06/26/17 0842 06/26/17 1134 06/26/17 1425  GLUCAP 131* 117* 117* 134* 100*   Lipid Profile: No results for input(s): CHOL, HDL, LDLCALC, TRIG, CHOLHDL, LDLDIRECT in the last 72 hours. Thyroid Function Tests: No results for input(s): TSH, T4TOTAL, FREET4, T3FREE, THYROIDAB in the last 72 hours. Anemia Panel: No results for input(s): VITAMINB12, FOLATE, FERRITIN, TIBC, IRON, RETICCTPCT in the last 72 hours. Sepsis Labs: Recent Labs  Lab 06/24/17 2014 06/24/17 2135 06/24/17 2324 06/25/17 0618 06/26/17 0048  PROCALCITON  --  0.21  --   --  0.97  LATICACIDVEN 6.10*  --  5.2* 2.6* 2.3*    Recent Results (from the past 240 hour(s))  Culture, blood (routine x 2)     Status: None (Preliminary result)   Collection Time: 06/24/17  9:57 PM  Result Value Ref Range Status   Specimen Description BLOOD RIGHT ARM  Final   Special Requests   Final    BOTTLES DRAWN AEROBIC AND ANAEROBIC Blood Culture results may not be optimal due to an inadequate volume of blood received in culture bottles   Culture   Final    NO GROWTH < 24 HOURS Performed at [facility], [address], [place], [place] [address]    Report Status PENDING  Incomplete  MRSA PCR  Screening     Status: None   Collection Time: 06/24/17 10:33 PM  Result Value Ref Range Status   MRSA by PCR NEGATIVE NEGATIVE Final    Comment:        The GeneXpert MRSA Assay (FDA approved for NASAL specimens only), is one component of a comprehensive MRSA colonization surveillance program. It is not intended to diagnose MRSA infection nor to guide or monitor treatment for MRSA infections. Performed at [facility], [address], [place], [place] [address]        Radiology Studies: Ct Head Wo Contrast  Result Date: 06/24/2017 CLINICAL DATA:  Status post cardiac arrest. Patient arrived from dialysis unresponsive and  pulseless. Now having seizure-like activities. EXAM: CT HEAD WITHOUT CONTRAST TECHNIQUE: Contiguous axial images were obtained from the base of the skull through the vertex without intravenous contrast. COMPARISON:  12/23/2016 head CT FINDINGS: Brain: Chronic moderate small vessel ischemic disease with chronic left basal ganglial lacunar infarct. No acute intracranial hemorrhage, midline shift or edema. Redemonstration of mild to moderate ventriculomegaly. No intra-axial mass nor extra-axial fluid collections. Midline fourth ventricle and basal cisterns. No effacement. No large vascular territory infarct. Vascular: No hyperdense vessel sign.  No unexpected calcifications. Skull: Intact bony calvarium.  Small left mastoid effusion. Sinuses/Orbits: Mild ethmoid sinus mucosal thickening. No air-fluid levels. Intact orbits and globes. Other: None IMPRESSION: Redemonstration of chronic moderate small vessel ischemic disease and left basal ganglial lacunar infarct. Central atrophy. No acute intracranial abnormality. Electronically Signed   By: Tollie Eth M.D.   On: 06/24/2017 18:30   Dg Chest Portable 1 View  Result Date: 06/24/2017 CLINICAL DATA:  Status post cardiac arrest EXAM: PORTABLE CHEST 1 VIEW COMPARISON:  05/13/2017 FINDINGS: Cardiac shadow is prominent but  accentuated by the portable technique. Lungs are well aerated bilaterally. No focal infiltrate or pneumothorax is seen. No acute bony abnormality is noted. IMPRESSION: No acute abnormality noted. Electronically Signed   By: Alcide Clever M.D.   On: 06/24/2017 18:10      Scheduled Meds: . aspirin EC  81 mg Oral Daily  . atorvastatin  80 mg Oral q1800  . [START ON 06/27/2017] calcitRIOL  0.5 mcg Oral Q M,W,F-HD  . clopidogrel  75 mg Oral Daily  . famotidine  20 mg Oral Daily  . [START ON 06/27/2017] heparin  5,000 Units Dialysis Once in dialysis  . insulin aspart  0-15 Units Subcutaneous TID WC  . lidocaine  1 patch Transdermal Q24H  . metoprolol tartrate  12.5 mg Oral BID  . multivitamin  1 tablet Oral QHS  . sevelamer carbonate  1,600 mg Oral TID WC   Continuous Infusions: . sodium chloride    . [START ON 06/27/2017] ferric gluconate (FERRLECIT/NULECIT) IV    . heparin 1,250 Units/hr (06/26/17 1300)  . sodium chloride 1,000 mL (06/26/17 1213)     LOS: 2 days    Time spent: 40 minutes   Noralee Stain, DO [facility] [email or web address] Password [email or web address] 06/26/2017, 2:42 PM

## 2017-06-26 NOTE — Progress Notes (Signed)
Progress Note  Patient Name: Richard Barnett Date of Encounter: 06/26/2017  Primary Cardiologist: Dr. Sharol Roussel, [facility]  Subjective   Still confused about situation, uncertain of why he is here.  No chest pain or shortness of breath at rest.  Inpatient Medications    Scheduled Meds: . aspirin EC  81 mg Oral Daily  . atorvastatin  80 mg Oral q1800  . [START ON 06/27/2017] calcitRIOL  0.5 mcg Oral Q M,W,F-HD  . clopidogrel  75 mg Oral Daily  . famotidine  20 mg Oral Daily  . insulin aspart  0-15 Units Subcutaneous Q4H  . lidocaine  1 patch Transdermal Q24H  . multivitamin  1 tablet Oral QHS  . sevelamer carbonate  1,600 mg Oral TID WC   Continuous Infusions: . sodium chloride    . [START ON 06/27/2017] ferric gluconate (FERRLECIT/NULECIT) IV    . heparin 1,250 Units/hr (06/26/17 1200)  . sodium chloride 1,000 mL (06/26/17 1213)   PRN Meds: sodium chloride, ketorolac   Vital Signs    Vitals:   06/26/17 0900 06/26/17 1000 06/26/17 1100 06/26/17 1200  BP: 101/72 (!) 147/94 (!) 144/93   Pulse: 69 77 72   Resp: 13 13 14    Temp:    98.1 F (36.7 C)  TempSrc:    Oral  SpO2: 100% 96% 100%   Weight:      Height:        Intake/Output Summary (Last 24 hours) at 06/26/2017 1246 Last data filed at 06/26/2017 1213 Gross per 24 hour  Intake 296.29 ml  Output 1255 ml  Net -958.71 ml   Filed Weights   06/24/17 2000 06/24/17 2200 06/26/17 0500  Weight: 216 lb 0.8 oz (98 kg) 211 lb 10.3 oz (96 kg) 215 lb 2.7 oz (97.6 kg)    Telemetry    Sinus rhythm.  Personally reviewed.  Physical Exam   GEN: No acute distress.   Neck: No JVD. Cardiac: RRR, no murmur, rub, or gallop.  Respiratory: Nonlabored. Clear to auscultation bilaterally. GI: Soft, nontender, bowel sounds present. MS:  Left upper extremity AV graft. Neuro:  Nonfocal. Psych: Alert and oriented x 1.  Labs    Chemistry Recent Labs  Lab 06/24/17 1730  06/25/17 0550 06/25/17 0940  06/26/17 0048  NA 137   < > 135 137 135  K 3.1*   < > 4.0 3.7 3.4*  CL 94*   < > 95* 96* 95*  CO2 19*  --  24 26 26   GLUCOSE 233*   < > 130* 116* 151*  BUN 10   < > 16 16 24*  CREATININE 5.43*   < > 6.28* 6.65* 7.80*  CALCIUM 8.2*  --  8.5* 8.6* 8.3*  PROT 7.6  --   --   --   --   ALBUMIN 3.8  --   --   --   --   AST 92*  --   --   --   --   ALT 65*  --   --   --   --   ALKPHOS 80  --   --   --   --   BILITOT 0.9  --   --   --   --   GFRNONAA 10*  --  9* 8* 7*  GFRAA 12*  --  10* 9* 8*  ANIONGAP 24*  --  16* 15 14   < > = values in this interval not displayed.  Hematology Recent Labs  Lab 06/24/17 1730 06/24/17 1737 06/25/17 0354 06/26/17 0048  WBC 11.0*  --  7.9 9.2  RBC 3.68*  --  3.45* 3.30*  HGB 10.7* 12.9* 10.2* 9.7*  HCT 35.3* 38.0* 32.3* 30.7*  MCV 95.9  --  93.6 93.0  MCH 29.1  --  29.6 29.4  MCHC 30.3  --  31.6 31.6  RDW 15.8*  --  15.9* 16.3*  PLT 392  --  353 346    Cardiac Enzymes Recent Labs  Lab 06/24/17 2008 06/25/17 0550 06/25/17 0940 06/26/17 0048  TROPONINI 0.64* 5.90* 5.51* 3.17*    Recent Labs  Lab 06/24/17 1736 06/24/17 2012  TROPIPOC 0.03 0.75*     Radiology    Ct Head Wo Contrast  Result Date: 06/24/2017 CLINICAL DATA:  Status post cardiac arrest. Patient arrived from dialysis unresponsive and pulseless. Now having seizure-like activities. EXAM: CT HEAD WITHOUT CONTRAST TECHNIQUE: Contiguous axial images were obtained from the base of the skull through the vertex without intravenous contrast. COMPARISON:  12/23/2016 head CT FINDINGS: Brain: Chronic moderate small vessel ischemic disease with chronic left basal ganglial lacunar infarct. No acute intracranial hemorrhage, midline shift or edema. Redemonstration of mild to moderate ventriculomegaly. No intra-axial mass nor extra-axial fluid collections. Midline fourth ventricle and basal cisterns. No effacement. No large vascular territory infarct. Vascular: No hyperdense vessel sign.   No unexpected calcifications. Skull: Intact bony calvarium.  Small left mastoid effusion. Sinuses/Orbits: Mild ethmoid sinus mucosal thickening. No air-fluid levels. Intact orbits and globes. Other: None IMPRESSION: Redemonstration of chronic moderate small vessel ischemic disease and left basal ganglial lacunar infarct. Central atrophy. No acute intracranial abnormality. Electronically Signed   By: Tollie Eth M.D.   On: 06/24/2017 18:30   Dg Chest Portable 1 View  Result Date: 06/24/2017 CLINICAL DATA:  Status post cardiac arrest EXAM: PORTABLE CHEST 1 VIEW COMPARISON:  05/13/2017 FINDINGS: Cardiac shadow is prominent but accentuated by the portable technique. Lungs are well aerated bilaterally. No focal infiltrate or pneumothorax is seen. No acute bony abnormality is noted. IMPRESSION: No acute abnormality noted. Electronically Signed   By: Alcide Clever M.D.   On: 06/24/2017 18:10    Cardiac Studies   Echocardiogram 06/25/2017: Study Conclusions  - Left ventricle: The cavity size was normal. Wall thickness was   increased in a pattern of mild LVH. Systolic function was normal.   The estimated ejection fraction was in the range of 60% to 65%.   Wall motion was normal; there were no regional wall motion   abnormalities. Doppler parameters are consistent with abnormal   left ventricular relaxation (grade 1 diastolic dysfunction). - Mitral valve: Mildly calcified annulus. There was trivial   regurgitation. - Right atrium: Central venous pressure (est): 3 mm Hg. - Tricuspid valve: There was mild regurgitation. - Pulmonary arteries: PA peak pressure: 28 mm Hg (S). - Pericardium, extracardiac: There was no pericardial effusion.  Patient Profile     62 y.o. Richard Barnett  with a history of ESRD on hemodialysis, depression, type 2 diabetes mellitus, hypertension, hyperlipidemia, and CAD with previous stent intervention to the obtuse marginal with cardiac catheterization in November 2018 demonstrating  first diagonal and first obtuse marginal stenoses that were managed medically in light of relatively small branches.  He presents after reported cardiac arrest in hemodialysis, treated with chest compressions and AED with reportedly 3 shocks delivered although actual rhythm at that time is uncertain based on incomplete information.  Patient reported to be in sinus tachycardia  with pulse on EMS arrival and did not require intubation.  He recalls no events from around that time.  Assessment & Plan    1.  Status post reported cardiac arrest during hemodialysis, absolute etiology is not certain at this point.  He has had no significant arrhythmias by telemetry monitoring.  Troponin I levels are elevated to peak of 5.9 suggestive of NSTEMI, but could still be a type II event rather than representing the inciting cause.  Echocardiogram shows normal LVEF at 60 to 65% without focal wall motion annormalities.  2.  CAD with prior stent intervention to the obtuse marginal with subsequent we documented first diagonal and first obtuse marginal disease that has been managed medically since November 2018.  Does describe potential angina symptoms.  3.  ESRD on hemodialysis.  4.  Essential hypertension.  5.  Hyperlipidemia on Lipitor.  6.  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  EEG abnormal showing very mild generalized cerebral dysfunction with background slowing but no epileptiform features.  Continue aspirin, Lipitor, Plavix.  Add low-dose beta-blocker for now.  Ultimately considering a diagnostic cardiac catheterization in light of his arrest and abnormal troponin I levels is reasonable, although would hopefully see improvement in his mental status prior to pursuing this.  Our service will continue to follow with you.  Signed, Nona Dell, MD  06/26/2017, 12:46 PM

## 2017-06-27 LAB — BASIC METABOLIC PANEL
ANION GAP: 11 (ref 5–15)
BUN: 29 mg/dL — ABNORMAL HIGH (ref 6–20)
CALCIUM: 8.8 mg/dL — AB (ref 8.9–10.3)
CO2: 26 mmol/L (ref 22–32)
CREATININE: 8.26 mg/dL — AB (ref 0.61–1.24)
Chloride: 100 mmol/L — ABNORMAL LOW (ref 101–111)
GFR calc Af Amer: 7 mL/min — ABNORMAL LOW (ref >60)
GFR, EST NON AFRICAN AMERICAN: 6 mL/min — AB (ref >60)
Glucose, Bld: 99 mg/dL (ref 65–99)
Potassium: 4.2 mmol/L (ref 3.5–5.1)
SODIUM: 137 mmol/L (ref 135–145)

## 2017-06-27 LAB — GLUCOSE, CAPILLARY
GLUCOSE-CAPILLARY: 138 mg/dL — AB (ref 65–99)
GLUCOSE-CAPILLARY: 101 mg/dL — AB (ref 65–99)
Glucose-Capillary: 100 mg/dL — ABNORMAL HIGH (ref 65–99)
Glucose-Capillary: 123 mg/dL — ABNORMAL HIGH (ref 65–99)

## 2017-06-27 LAB — CBC
HCT: 33.1 % — ABNORMAL LOW (ref 39.0–52.0)
HEMOGLOBIN: 10.3 g/dL — AB (ref 13.0–17.0)
MCH: 29.2 pg (ref 26.0–34.0)
MCHC: 31.1 g/dL (ref 30.0–36.0)
MCV: 93.8 fL (ref 78.0–100.0)
PLATELETS: 361 10*3/uL (ref 150–400)
RBC: 3.53 MIL/uL — ABNORMAL LOW (ref 4.22–5.81)
RDW: 16.2 % — ABNORMAL HIGH (ref 11.5–15.5)
WBC: 7.9 10*3/uL (ref 4.0–10.5)

## 2017-06-27 LAB — HEPARIN LEVEL (UNFRACTIONATED)
Heparin Unfractionated: 0.13 IU/mL — ABNORMAL LOW (ref 0.30–0.70)
Heparin Unfractionated: 0.27 IU/mL — ABNORMAL LOW (ref 0.30–0.70)

## 2017-06-27 LAB — MAGNESIUM: MAGNESIUM: 2 mg/dL (ref 1.7–2.4)

## 2017-06-27 LAB — PROCALCITONIN: PROCALCITONIN: 0.71 ng/mL

## 2017-06-27 MED ORDER — SODIUM CHLORIDE 0.9% FLUSH
3.0000 mL | Freq: Two times a day (BID) | INTRAVENOUS | Status: DC
  Administered 2017-06-27: 3 mL via INTRAVENOUS

## 2017-06-27 MED ORDER — CALCITRIOL 0.5 MCG PO CAPS: 0.5000 ug | ORAL_CAPSULE | Freq: Once | ORAL | Status: DC

## 2017-06-27 MED ORDER — SODIUM CHLORIDE 0.9 % IV SOLN: 250.0000 mL | INTRAVENOUS | Status: DC | PRN

## 2017-06-27 MED ORDER — SODIUM CHLORIDE 0.9% FLUSH: 3.0000 mL | INTRAVENOUS | Status: DC | PRN

## 2017-06-27 MED ORDER — SODIUM CHLORIDE 0.9 % IV SOLN
Freq: Once | INTRAVENOUS | Status: AC
  Administered 2017-06-28: via INTRAVENOUS

## 2017-06-27 MED ORDER — SODIUM CHLORIDE 0.9 % IV SOLN
125.0000 mg | Freq: Once | INTRAVENOUS | Status: AC
  Administered 2017-06-28: 125 mg via INTRAVENOUS
  Filled 2017-06-27 (×2): qty 10

## 2017-06-27 NOTE — H&P (View-Only) (Signed)
Progress Note  Patient Name: Richard Barnett Date of Encounter: 06/27/2017  Primary Cardiologist: No primary care provider on file.  Subjective   Able to have a conversion, but still confused about situation and events surrounding his hospital admission.   Inpatient Medications    Scheduled Meds: . aspirin EC  81 mg Oral Daily  . atorvastatin  80 mg Oral q1800  . calcitRIOL  0.5 mcg Oral Q M,W,F-HD  . clopidogrel  75 mg Oral Daily  . famotidine  20 mg Oral Daily  . heparin  5,000 Units Dialysis Once in dialysis  . insulin aspart  0-15 Units Subcutaneous TID WC  . lidocaine  1 patch Transdermal Q24H  . metoprolol tartrate  12.5 mg Oral BID  . multivitamin  1 tablet Oral QHS  . sevelamer carbonate  1,600 mg Oral TID WC   Continuous Infusions: . sodium chloride    . ferric gluconate (FERRLECIT/NULECIT) IV    . heparin 1,250 Units/hr (06/27/17 0901)   PRN Meds: sodium chloride, ketorolac   Vital Signs    Vitals:   06/26/17 1601 06/26/17 2006 06/27/17 0058 06/27/17 0333  BP: (!) 142/94 138/78 (!) 151/91 133/81  Pulse: 81 76 77 75  Resp: 20     Temp: 98.2 F (36.8 C) 98.4 F (36.9 C)  98.6 F (37 C)  TempSrc: Oral Oral  Oral  SpO2: 100% 100%  100%  Weight:  215 lb (97.5 kg)    Height:        Intake/Output Summary (Last 24 hours) at 06/27/2017 1003 Last data filed at 06/27/2017 5929 Gross per 24 hour  Intake 984.29 ml  Output 1900 ml  Net -915.71 ml   Filed Weights   06/24/17 2200 06/26/17 0500 06/26/17 2006  Weight: 211 lb 10.3 oz (96 kg) 215 lb 2.7 oz (97.6 kg) 215 lb (97.5 kg)    Telemetry    SR - Personally Reviewed  ECG    SR with diffuse ST depression (4/19 1727)- Personally Reviewed  Physical Exam   General: Well developed, well nourished, AA [place] appearing in no acute distress. Head: Normocephalic, atraumatic.  Neck: Supple, no JVD. Lungs:  Resp regular and unlabored, CTA. Heart: RRR, S1, S2, no S3, S4, or murmur; no rub. Abdomen: Soft,  non-tender, non-distended with normoactive bowel sounds.  Extremities: No clubbing, cyanosis, edema. Distal pedal pulses are 2+ bilaterally. Neuro: Alert and oriented to self and year. Still confused. Moves all extremities spontaneously. Psych: Normal affect.  Labs    Chemistry Recent Labs  Lab 06/24/17 1730  06/25/17 0940 06/26/17 0048 06/27/17 0507  NA 137   < > 137 135 137  K 3.1*   < > 3.7 3.4* 4.2  CL 94*   < > 96* 95* 100*  CO2 19*   < > 26 26 26   GLUCOSE 233*   < > 116* 151* 99  BUN 10   < > 16 24* 29*  CREATININE 5.43*   < > 6.65* 7.80* 8.26*  CALCIUM 8.2*   < > 8.6* 8.3* 8.8*  PROT 7.6  --   --   --   --   ALBUMIN 3.8  --   --   --   --   AST 92*  --   --   --   --   ALT 65*  --   --   --   --   ALKPHOS 80  --   --   --   --  BILITOT 0.9  --   --   --   --   GFRNONAA 10*   < > 8* 7* 6*  GFRAA 12*   < > 9* 8* 7*  ANIONGAP 24*   < > 15 14 11    < > = values in this interval not displayed.     Hematology Recent Labs  Lab 06/25/17 0354 06/26/17 0048 06/27/17 0507  WBC 7.9 9.2 7.9  RBC 3.45* 3.30* 3.53*  HGB 10.2* 9.7* 10.3*  HCT 32.3* 30.7* 33.1*  MCV 93.6 93.0 93.8  MCH 29.6 29.4 29.2  MCHC 31.6 31.6 31.1  RDW 15.9* 16.3* 16.2*  PLT 353 346 361    Cardiac Enzymes Recent Labs  Lab 06/24/17 2008 06/25/17 0550 06/25/17 0940 06/26/17 0048  TROPONINI 0.64* 5.90* 5.51* 3.17*    Recent Labs  Lab 06/24/17 1736 06/24/17 2012  TROPIPOC 0.03 0.75*     BNPNo results for input(s): BNP, PROBNP in the last 168 hours.   DDimer No results for input(s): DDIMER in the last 168 hours.    Radiology    No results found.  Cardiac Studies   TTE: 06/25/17  Study Conclusions  - Left ventricle: The cavity size was normal. Wall thickness was   increased in a pattern of mild LVH. Systolic function was normal.   The estimated ejection fraction was in the range of 60% to 65%.   Wall motion was normal; there were no regional wall motion   abnormalities.  Doppler parameters are consistent with abnormal   left ventricular relaxation (grade 1 diastolic dysfunction). - Mitral valve: Mildly calcified annulus. There was trivial   regurgitation. - Right atrium: Central venous pressure (est): 3 mm Hg. - Tricuspid valve: There was mild regurgitation. - Pulmonary arteries: PA peak pressure: 28 mm Hg (S). - Pericardium, extracardiac: There was no pericardial effusion.  Patient Profile     62 y.o. Richard Barnett with a history of ESRD on hemodialysis, depression, type 2 diabetes mellitus, hypertension, hyperlipidemia, and CAD with previous stent intervention to the obtuse marginal with cardiac catheterization in November 2018 demonstrating first diagonal and first obtuse marginal stenoses that were managed medically in light of relatively small branches. He presents after reported cardiac arrest inhemodialysis, treated with chest compressions and AED with reportedly 3 shocks delivered although actual rhythm at that time is uncertain based on incomplete information. Patient reported to be in sinus tachycardia with pulse on EMS arrival and did not require intubation. He recalls no events from around that time.  Assessment & Plan    1. Cardiac Arrest: etiology is unclear, but reportedly was shocked 3 times with AED prior to EMS arrival. On their arrival he was back in SR, and alert. Telemetry without significant arrhthymias. Trop peaked at 5.9. Echo with normal EF and no WMA. He c/o chest wall pain from CPR. Per Dr. Diona Browner cardiac cath would be reasonable once he has improved his mental status. Seems more clear today, but still confused.   2. CAD: Last cath 11/18 showed previous stent with ISR and no intervention done given the vessel was small. He has been medically managed since that time.   3. ERSD on HD: on MWF schedule, nephrology following.  4. HTN: stable  5. HL: on lipitor  6. AMS: possibly hypoxic encephalopathy: EEG was abnormal with generalized  cerebral dysfunction. Seems to be some improved today.    Signed, Laverda Page, NP  06/27/2017, 10:03 AM  Pager # [phone]   For questions or updates,  please contact [place] Please consult [email or web address] for contact info under Cardiology/STEMI.

## 2017-06-27 NOTE — Progress Notes (Signed)
PROGRESS NOTE    Richard Barnett  ZOX:[id number] DOB: 1955-10-19 DOA: 06/24/2017 PCP: Patient, No Pcp Per     Brief Narrative:  Richard Barnett is a 62 year old [place] with PMH of CAD s/p stent 01/2017, ESRD on HD MWF (began 04/2017), depression, DM, GERD, HTN who presented to ED on 4/19 from dialysis center. EMS reports that patient received 3 out of 4 hours of dialysis then was noted to have loss of  consciousness. AED placed. Patient shocked 3 times. Received 10 minutes of CPR before return of ROSC. Upon arrival to ED patient is confused but awake with contusions to tongue. Initial EKG with sinus tachycardia. CT head negative. Patient was admitted to ICU service with cardiology and nephrology consultation. Transferred to [facility] 4/21.   Assessment & Plan:   Active Problems:   Cardiac arrest [facility])  Status post cardiac arrest -Etiology not clear -Cardiology following, heart cath planned 4/23 AM  NSTEMI -Troponin peak of 5.9 but unclear if precipitating event for arrest vs demand ischemia resulting from arrest -Remains on heparin gtt  -Cardiology following, heart cath planned 4/23 AM  Chronic diastolic dysfunction -Echo EF 81%, grade 1 dd   CAD status post stents -Continue aspirin, Plavix, Lipitor, lopressor   DM type 2 -SSI   ESRD on hemodialysis MWF -Nephrology following   Acute metabolic encephalopathy -Could be secondary to transient hypoperfusion after cardiac arrest -EEG with abnormal showing very mild generalized cerebral dysfunction with background slowing but no epileptiform features -Stable   Musculoskeletal CP -S/p CPR -Toradol and lidoderm patch    DVT prophylaxis: Heparin gtt Code Status: Full Family Communication: No family at bedside Disposition Plan: Pending cardiology work up    Consultants:   PCCM admission  Nephrology  Cardiology  Antimicrobials:  Anti-infectives (From admission, onward)   None       Subjective: No complaints aside from  central chest pain that is sharp in nature. No SOB. Still confused regarding events surrounding admission  Objective: Vitals:   06/26/17 2006 06/27/17 0058 06/27/17 0333 06/27/17 1019  BP: 138/78 (!) 151/91 133/81 (!) 152/89  Pulse: 76 77 75 75  Resp:    18  Temp: 98.4 F (36.9 C)  98.6 F (37 C) 97.8 F (36.6 C)  TempSrc: Oral  Oral Oral  SpO2: 100%  100% 99%  Weight: 97.5 kg (215 lb)     Height:        Intake/Output Summary (Last 24 hours) at 06/27/2017 1312 Last data filed at 06/27/2017 1128 Gross per 24 hour  Intake 972.83 ml  Output 1900 ml  Net -927.17 ml   Filed Weights   06/24/17 2200 06/26/17 0500 06/26/17 2006  Weight: 96 kg (211 lb 10.3 oz) 97.6 kg (215 lb 2.7 oz) 97.5 kg (215 lb)    Examination: General exam: Appears calm and comfortable  Respiratory system: Clear to auscultation. Respiratory effort normal. Cardiovascular system: S1 & S2 heard, RRR. No JVD, murmurs, rubs, gallops or clicks. No pedal edema. Gastrointestinal system: Abdomen is nondistended, soft and nontender. No organomegaly or masses felt. Normal bowel sounds heard. Central nervous system: Alert and oriented to self and place. No focal neurological deficits. Extremities: Symmetric 5 x 5 power. Skin: No rashes, lesions or ulcers Psychiatry: Judgement and insight appear normal. Mood & affect appropriate.   Data Reviewed: I have personally reviewed following labs and imaging studies  CBC: Recent Labs  Lab 06/24/17 1730 06/24/17 1737 06/25/17 0354 06/26/17 0048 06/27/17 0507  WBC 11.0*  --  7.9 9.2 7.9  HGB 10.7* 12.9* 10.2* 9.7* 10.3*  HCT 35.3* 38.0* 32.3* 30.7* 33.1*  MCV 95.9  --  93.6 93.0 93.8  PLT 392  --  353 346 361   Basic Metabolic Panel: Recent Labs  Lab 06/24/17 1730 06/24/17 1737 06/25/17 0550 06/25/17 0940 06/26/17 0048 06/27/17 0507  NA 137 136 135 137 135 137  K 3.1* 3.2* 4.0 3.7 3.4* 4.2  CL 94* 92* 95* 96* 95* 100*  CO2 19*  --  24 26 26 26   GLUCOSE 233*  237* 130* 116* 151* 99  BUN 10 12 16 16  24* 29*  CREATININE 5.43* 5.20* 6.28* 6.65* 7.80* 8.26*  CALCIUM 8.2*  --  8.5* 8.6* 8.3* 8.8*  MG 2.2  --  1.9  --  1.9 2.0  PHOS  --   --  2.3*  --  4.2  --    GFR: Estimated Creatinine Clearance: 10.3 mL/min (A) (by C-G formula based on SCr of 8.26 mg/dL (H)). Liver Function Tests: Recent Labs  Lab 06/24/17 1730  AST 92*  ALT 65*  ALKPHOS 80  BILITOT 0.9  PROT 7.6  ALBUMIN 3.8   No results for input(s): LIPASE, AMYLASE in the last 168 hours. No results for input(s): AMMONIA in the last 168 hours. Coagulation Profile: Recent Labs  Lab 06/24/17 1730  INR 1.02   Cardiac Enzymes: Recent Labs  Lab 06/24/17 2008 06/25/17 0550 06/25/17 0940 06/26/17 0048  TROPONINI 0.64* 5.90* 5.51* 3.17*   BNP (last 3 results) No results for input(s): PROBNP in the last 8760 hours. HbA1C: Recent Labs    06/25/17 0354  HGBA1C 4.9   CBG: Recent Labs  Lab 06/26/17 1559 06/26/17 1600 06/26/17 2123 06/27/17 0741 06/27/17 1155  GLUCAP 87 111* 114* 100* 138*   Lipid Profile: No results for input(s): CHOL, HDL, LDLCALC, TRIG, CHOLHDL, LDLDIRECT in the last 72 hours. Thyroid Function Tests: No results for input(s): TSH, T4TOTAL, FREET4, T3FREE, THYROIDAB in the last 72 hours. Anemia Panel: No results for input(s): VITAMINB12, FOLATE, FERRITIN, TIBC, IRON, RETICCTPCT in the last 72 hours. Sepsis Labs: Recent Labs  Lab 06/24/17 2014 06/24/17 2135 06/24/17 2324 06/25/17 0618 06/26/17 0048 06/27/17 0507  PROCALCITON  --  0.21  --   --  0.97 0.71  LATICACIDVEN 6.10*  --  5.2* 2.6* 2.3*  --     Recent Results (from the past 240 hour(s))  Culture, blood (routine x 2)     Status: None (Preliminary result)   Collection Time: 06/24/17  9:57 PM  Result Value Ref Range Status   Specimen Description BLOOD RIGHT ARM  Final   Special Requests   Final    BOTTLES DRAWN AEROBIC AND ANAEROBIC Blood Culture results may not be optimal due to an  inadequate volume of blood received in culture bottles   Culture   Final    NO GROWTH 3 DAYS Performed at [facility], [address] [place], [place] [address]    Report Status PENDING  Incomplete  MRSA PCR Screening     Status: None   Collection Time: 06/24/17 10:33 PM  Result Value Ref Range Status   MRSA by PCR NEGATIVE NEGATIVE Final    Comment:        The GeneXpert MRSA Assay (FDA approved for NASAL specimens only), is one component of a comprehensive MRSA colonization surveillance program. It is not intended to diagnose MRSA infection nor to guide or monitor treatment for MRSA infections. Performed at [facility], [address], [place], [place] [address]   Culture, blood (routine x 2)     Status: None (Preliminary result)   Collection Time: 06/24/17 10:55 PM  Result Value Ref Range Status   Specimen Description BLOOD RIGHT FOOT  Final   Special Requests   Final    BOTTLES DRAWN AEROBIC ONLY Blood Culture results may not be optimal due to an inadequate volume of blood received in culture bottles   Culture   Final    NO GROWTH 2 DAYS Performed at [facility], [address], [place], [place] [address]    Report Status PENDING  Incomplete       Radiology Studies: No results found.    Scheduled Meds: . aspirin EC  81 mg Oral Daily  . atorvastatin  80 mg Oral q1800  . calcitRIOL  0.5 mcg Oral Q M,W,F-HD  . clopidogrel  75 mg Oral Daily  . famotidine  20 mg Oral Daily  . heparin  5,000 Units Dialysis Once in dialysis  . insulin aspart  0-15 Units Subcutaneous TID WC  . lidocaine  1 patch Transdermal Q24H  . metoprolol tartrate  12.5 mg Oral BID  . multivitamin  1 tablet Oral QHS  . sevelamer carbonate  1,600 mg Oral TID WC  . sodium chloride flush  3 mL Intravenous Q12H   Continuous Infusions: . sodium chloride    . sodium chloride    . [START ON 06/28/2017] sodium chloride    . ferric gluconate (FERRLECIT/NULECIT) IV    . heparin  1,250 Units/hr (06/27/17 0901)     LOS: 3 days    Time spent:   Noralee Stain, DO [facility] [email or web address] Password [email or web address] 06/27/2017, 1:12 PM

## 2017-06-27 NOTE — Progress Notes (Signed)
Richard Barnett Progress Note   Subjective:   Sitting up eating breakfast.  Feeling ok.  Only complaint is chest wall/sternum pain w/movement.  Objective Vitals:   06/26/17 2006 06/27/17 0058 06/27/17 0333 06/27/17 1019  BP: 138/78 (!) 151/91 133/81 (!) 152/89  Pulse: 76 77 75 75  Resp:    18  Temp: 98.4 F (36.9 C)  98.6 F (37 C) 97.8 F (36.6 C)  TempSrc: Oral  Oral Oral  SpO2: 100%  100% 99%  Weight: 97.5 kg (215 lb)     Height:       Physical Exam General:NAD, WDWN, Robben sitting up in bed Heart:RRR, no mrg Lungs:CTAB Abdomen:soft, NTND, +BS Extremities:no LE edema Dialysis Access: LU AVG +bruit   Filed Weights   06/24/17 2200 06/26/17 0500 06/26/17 2006  Weight: 96 kg (211 lb 10.3 oz) 97.6 kg (215 lb 2.7 oz) 97.5 kg (215 lb)    Intake/Output Summary (Last 24 hours) at 06/27/2017 1021 Last data filed at 06/27/2017 0923 Gross per 24 hour  Intake 984.29 ml  Output 1900 ml  Net -915.71 ml    Additional Objective Labs: Basic Metabolic Panel: Recent Labs  Lab 06/25/17 0550 06/25/17 0940 06/26/17 0048 06/27/17 0507  NA 135 137 135 137  K 4.0 3.7 3.4* 4.2  CL 95* 96* 95* 100*  CO2 24 26 26 26   GLUCOSE 130* 116* 151* 99  BUN 16 16 24* 29*  CREATININE 6.28* 6.65* 7.80* 8.26*  CALCIUM 8.5* 8.6* 8.3* 8.8*  PHOS 2.3*  --  4.2  --    Liver Function Tests: Recent Labs  Lab 06/24/17 1730  AST 92*  ALT 65*  ALKPHOS 80  BILITOT 0.9  PROT 7.6  ALBUMIN 3.8   CBC: Recent Labs  Lab 06/24/17 1730  06/25/17 0354 06/26/17 0048 06/27/17 0507  WBC 11.0*  --  7.9 9.2 7.9  HGB 10.7*   < > 10.2* 9.7* 10.3*  HCT 35.3*   < > 32.3* 30.7* 33.1*  MCV 95.9  --  93.6 93.0 93.8  PLT 392  --  353 346 361   < > = values in this interval not displayed.   Blood Culture    Component Value Date/Time   SDES BLOOD RIGHT FOOT 06/24/2017 2255   SPECREQUEST  06/24/2017 2255    BOTTLES DRAWN AEROBIC ONLY Blood Culture results may not be optimal due to an  inadequate volume of blood received in culture bottles   CULT  06/24/2017 2255    NO GROWTH 1 DAY Performed at [facility], [address], [place], [place] [address]    REPTSTATUS PENDING 06/24/2017 2255    Cardiac Enzymes: Recent Labs  Lab 06/24/17 2008 06/25/17 0550 06/25/17 0940 06/26/17 0048  TROPONINI 0.64* 5.90* 5.51* 3.17*   CBG: Recent Labs  Lab 06/26/17 1425 06/26/17 1559 06/26/17 1600 06/26/17 2123 06/27/17 0741  GLUCAP 100* 87 111* 114* 100*   Medications: . sodium chloride    . ferric gluconate (FERRLECIT/NULECIT) IV    . heparin 1,250 Units/hr (06/27/17 0901)   . aspirin EC  81 mg Oral Daily  . atorvastatin  80 mg Oral q1800  . calcitRIOL  0.5 mcg Oral Q M,W,F-HD  . clopidogrel  75 mg Oral Daily  . famotidine  20 mg Oral Daily  . heparin  5,000 Units Dialysis Once in dialysis  . insulin aspart  0-15 Units Subcutaneous TID WC  . lidocaine  1 patch Transdermal Q24H  . metoprolol tartrate  12.5 mg Oral BID  . multivitamin  1 tablet Oral QHS  . sevelamer carbonate  1,600 mg Oral TID WC    Dialysis Orders: [facility] MWF 4h  98kg  2/2 bath  400/800   Hep 5000  LUE AVG Mircera 150 mcg q 2 weeks, last given 4/10 Venofer 100 mg q 5 rx, started 4/17 Calcitriol 0.5 mcg q rx  Assessment/Plan: 1. Cardiac arrest - unclear etiology. Cardiology following.  Possible heart cath. +h/o CAD s/p stent. Last Cath 01/2017 - disease not severe, no intervention required. ECHO 06/25/17: EF 60-65%, grade 1 DD. 2. ESRD - HD today, continue per regular MWF schedule.  K 4.2. 3. Anemia of CKD- Hgb 10.3. ESA not indicated.  Continue iron load w/HD. 4. Secondary hyperparathyroidism - Ca & P in goal. Continue VDRA & binders. 5. HTN/volume - BP slightly elevated today. On lopressor. Appears euvolemic on exam, given fluid bolus.  Slightly under EDW.  Keep even today.  6. Nutrition - Renal/carb modified diet. Renavite.  7. Depression - Admit in March for SI. On ZOloft.  Virgina Norfolk, PA-C [facility] Pager: [phone] 06/27/2017,10:21 AM  LOS: 3 days

## 2017-06-27 NOTE — Progress Notes (Signed)
Patient unable to sign consent D/T disorientation. Lillia Abed, NP informed of this information. Myrene Buddy, patients daughter, to be updated of the procedure. Will continue to monitor.

## 2017-06-27 NOTE — Progress Notes (Signed)
ANTICOAGULATION CONSULT NOTE - Follow Up Consult  Pharmacy Consult for heparin Indication: chest pain/ACS  Labs: Recent Labs    06/24/17 1730 06/24/17 1737  06/25/17 0354  06/25/17 0550 06/25/17 0940  06/26/17 0048 06/26/17 0725 06/26/17 1149 06/27/17 0507  HGB 10.7* 12.9*  --  10.2*  --   --   --   --  9.7*  --   --   --   HCT 35.3* 38.0*  --  32.3*  --   --   --   --  30.7*  --   --   --   PLT 392  --   --  353  --   --   --   --  346  --   --   --   APTT 29  --   --   --   --   --   --   --   --   --   --   --   LABPROT 13.3  --   --   --   --   --   --   --   --   --   --   --   INR 1.02  --   --   --   --   --   --   --   --   --   --   --   HEPARINUNFRC  --   --   --   --    < >  --   --    < > 0.24* 0.35 0.37 0.13*  CREATININE 5.43* 5.20*  --   --   --  6.28* 6.65*  --  7.80*  --   --  8.26*  TROPONINI  --   --    < >  --   --  5.90* 5.51*  --  3.17*  --   --   --    < > = values in this interval not displayed.    Assessment/Plan:  62yo Raoul subtherapeutic on heparin though RN reports that gtt has been interrupted repeatedly overnight d/t pt fidgeting. Will continue gtt at current rate for now and check additional level.   Vernard Gambles, PharmD, BCPS  06/27/2017,6:30 AM

## 2017-06-27 NOTE — Progress Notes (Signed)
This RN called HD and Melissa, RN stated that patient would not be going to HD today, but possibly tomorrow. Melissa, RN informed that patient was scheduled for a cardiac cath tomorrow at 0730. Patient informed of this information. Will continue to monitor.

## 2017-06-27 NOTE — Progress Notes (Addendum)
ANTICOAGULATION CONSULT NOTE  Pharmacy Consult for heparin Indication: NSTEMI  Labs: Recent Labs    06/24/17 1730  06/25/17 0354  06/25/17 0550 06/25/17 0940  06/26/17 0048  06/26/17 1149 06/27/17 0507 06/27/17 1322  HGB 10.7*   < > 10.2*  --   --   --   --  9.7*  --   --  10.3*  --   HCT 35.3*   < > 32.3*  --   --   --   --  30.7*  --   --  33.1*  --   PLT 392  --  353  --   --   --   --  346  --   --  361  --   APTT 29  --   --   --   --   --   --   --   --   --   --   --   LABPROT 13.3  --   --   --   --   --   --   --   --   --   --   --   INR 1.02  --   --   --   --   --   --   --   --   --   --   --   HEPARINUNFRC  --   --   --    < >  --   --    < > 0.24*   < > 0.37 0.13* 0.27*  CREATININE 5.43*   < >  --   --  6.28* 6.65*  --  7.80*  --   --  8.26*  --   TROPONINI  --    < >  --   --  5.90* 5.51*  --  3.17*  --   --   --   --    < > = values in this interval not displayed.     Assessment: 62 yo Richard Barnett presented to ED from HD as a post-CPR and suspected post-seizure. CT negative for acute intracranial abnormalities.  Patient has history of CAD s/p stenting. Patient on DAPT with aspirin and plavix at home.  Heparin level is subtherapeutic 0.27 on 1250 units/hr. Hgb low but stable, no bleeding reported. Planning on cath tomorrow.  Goal of Therapy:  Heparin level 0.3-0.7 units/ml Monitor platelets by anticoagulation protocol: Yes   Plan:  Increase heparin drip to 1350 units/hr Daily heparin level and CBC Monitor for s/sx of bleeding   Loura Back, PharmD, BCPS Clinical Pharmacist Clinical phone for 06/27/2017 until 4p is [phone] After 4p, please call Main Rx at [phone] for assistance 06/27/2017 3:05 PM

## 2017-06-27 NOTE — Progress Notes (Signed)
Progress Note  Patient Name: Richard Barnett Date of Encounter: 06/27/2017  Primary Cardiologist: No primary care provider on file.  Subjective   Able to have a conversion, but still confused about situation and events surrounding his hospital admission.   Inpatient Medications    Scheduled Meds: . aspirin EC  81 mg Oral Daily  . atorvastatin  80 mg Oral q1800  . calcitRIOL  0.5 mcg Oral Q M,W,F-HD  . clopidogrel  75 mg Oral Daily  . famotidine  20 mg Oral Daily  . heparin  5,000 Units Dialysis Once in dialysis  . insulin aspart  0-15 Units Subcutaneous TID WC  . lidocaine  1 patch Transdermal Q24H  . metoprolol tartrate  12.5 mg Oral BID  . multivitamin  1 tablet Oral QHS  . sevelamer carbonate  1,600 mg Oral TID WC   Continuous Infusions: . sodium chloride    . ferric gluconate (FERRLECIT/NULECIT) IV    . heparin 1,250 Units/hr (06/27/17 0901)   PRN Meds: sodium chloride, ketorolac   Vital Signs    Vitals:   06/26/17 1601 06/26/17 2006 06/27/17 0058 06/27/17 0333  BP: (!) 142/94 138/78 (!) 151/91 133/81  Pulse: 81 76 77 75  Resp: 20     Temp: 98.2 F (36.8 C) 98.4 F (36.9 C)  98.6 F (37 C)  TempSrc: Oral Oral  Oral  SpO2: 100% 100%  100%  Weight:  215 lb (97.5 kg)    Height:        Intake/Output Summary (Last 24 hours) at 06/27/2017 1003 Last data filed at 06/27/2017 5929 Gross per 24 hour  Intake 984.29 ml  Output 1900 ml  Net -915.71 ml   Filed Weights   06/24/17 2200 06/26/17 0500 06/26/17 2006  Weight: 211 lb 10.3 oz (96 kg) 215 lb 2.7 oz (97.6 kg) 215 lb (97.5 kg)    Telemetry    SR - Personally Reviewed  ECG    SR with diffuse ST depression (4/19 1727)- Personally Reviewed  Physical Exam   General: Well developed, well nourished, AA Richard Barnett appearing in no acute distress. Head: Normocephalic, atraumatic.  Neck: Supple, no JVD. Lungs:  Resp regular and unlabored, CTA. Heart: RRR, S1, S2, no S3, S4, or murmur; no rub. Abdomen: Soft,  non-tender, non-distended with normoactive bowel sounds.  Extremities: No clubbing, cyanosis, edema. Distal pedal pulses are 2+ bilaterally. Neuro: Alert and oriented to self and year. Still confused. Moves all extremities spontaneously. Psych: Normal affect.  Labs    Chemistry Recent Labs  Lab 06/24/17 1730  06/25/17 0940 06/26/17 0048 06/27/17 0507  NA 137   < > 137 135 137  K 3.1*   < > 3.7 3.4* 4.2  CL 94*   < > 96* 95* 100*  CO2 19*   < > 26 26 26   GLUCOSE 233*   < > 116* 151* 99  BUN 10   < > 16 24* 29*  CREATININE 5.43*   < > 6.65* 7.80* 8.26*  CALCIUM 8.2*   < > 8.6* 8.3* 8.8*  PROT 7.6  --   --   --   --   ALBUMIN 3.8  --   --   --   --   AST 92*  --   --   --   --   ALT 65*  --   --   --   --   ALKPHOS 80  --   --   --   --  BILITOT 0.9  --   --   --   --   GFRNONAA 10*   < > 8* 7* 6*  GFRAA 12*   < > 9* 8* 7*  ANIONGAP 24*   < > 15 14 11    < > = values in this interval not displayed.     Hematology Recent Labs  Lab 06/25/17 0354 06/26/17 0048 06/27/17 0507  WBC 7.9 9.2 7.9  RBC 3.45* 3.30* 3.53*  HGB 10.2* 9.7* 10.3*  HCT 32.3* 30.7* 33.1*  MCV 93.6 93.0 93.8  MCH 29.6 29.4 29.2  MCHC 31.6 31.6 31.1  RDW 15.9* 16.3* 16.2*  PLT 353 346 361    Cardiac Enzymes Recent Labs  Lab 06/24/17 2008 06/25/17 0550 06/25/17 0940 06/26/17 0048  TROPONINI 0.64* 5.90* 5.51* 3.17*    Recent Labs  Lab 06/24/17 1736 06/24/17 2012  TROPIPOC 0.03 0.75*     BNPNo results for input(s): BNP, PROBNP in the last 168 hours.   DDimer No results for input(s): DDIMER in the last 168 hours.    Radiology    No results found.  Cardiac Studies   TTE: 06/25/17  Study Conclusions  - Left ventricle: The cavity size was normal. Wall thickness was   increased in a pattern of mild LVH. Systolic function was normal.   The estimated ejection fraction was in the range of 60% to 65%.   Wall motion was normal; there were no regional wall motion   abnormalities.  Doppler parameters are consistent with abnormal   left ventricular relaxation (grade 1 diastolic dysfunction). - Mitral valve: Mildly calcified annulus. There was trivial   regurgitation. - Right atrium: Central venous pressure (est): 3 mm Hg. - Tricuspid valve: There was mild regurgitation. - Pulmonary arteries: PA peak pressure: 28 mm Hg (S). - Pericardium, extracardiac: There was no pericardial effusion.  Patient Profile     62 y.o. Richard Barnett with a history of ESRD on hemodialysis, depression, type 2 diabetes mellitus, hypertension, hyperlipidemia, and CAD with previous stent intervention to the obtuse marginal with cardiac catheterization in November 2018 demonstrating first diagonal and first obtuse marginal stenoses that were managed medically in light of relatively small branches. He presents after reported cardiac arrest inhemodialysis, treated with chest compressions and AED with reportedly 3 shocks delivered although actual rhythm at that time is uncertain based on incomplete information. Patient reported to be in sinus tachycardia with pulse on EMS arrival and did not require intubation. He recalls no events from around that time.  Assessment & Plan    1. Cardiac Arrest: etiology is unclear, but reportedly was shocked 3 times with AED prior to EMS arrival. On their arrival he was back in SR, and alert. Telemetry without significant arrhthymias. Trop peaked at 5.9. Echo with normal EF and no WMA. He c/o chest wall pain from CPR. Per Dr. Diona Barnett cardiac cath would be reasonable once he has improved his mental status. Seems more clear today, but still confused.   2. CAD: Last cath 11/18 showed previous stent with ISR and no intervention done given the vessel was small. He has been medically managed since that time.   3. ERSD on HD: on MWF schedule, nephrology following.  4. HTN: stable  5. HL: on lipitor  6. AMS: possibly hypoxic encephalopathy: EEG was abnormal with generalized  cerebral dysfunction. Seems to be some improved today.    Signed, Richard Page, NP  06/27/2017, 10:03 AM  Pager # [phone]   For questions or updates,  please contact Richard Barnett Please consult [email or web address] for contact info under Cardiology/STEMI.

## 2017-06-28 ENCOUNTER — Encounter (HOSPITAL_COMMUNITY): Payer: Self-pay | Admitting: Anesthesiology

## 2017-06-28 ENCOUNTER — Other Ambulatory Visit: Payer: Self-pay

## 2017-06-28 ENCOUNTER — Encounter (HOSPITAL_COMMUNITY)
Admission: EM | Disposition: A | Discharge status: Skilled Nursing Facility | Payer: Self-pay | Source: Home / Self Care | Attending: Internal Medicine

## 2017-06-28 DIAGNOSIS — I2510 Atherosclerotic heart disease of native coronary artery without angina pectoris: Secondary | ICD-10-CM

## 2017-06-28 HISTORY — PX: LEFT HEART CATH AND CORONARY ANGIOGRAPHY: CATH118249

## 2017-06-28 LAB — CBC
HEMATOCRIT: 30.6 % — AB (ref 39.0–52.0)
Hemoglobin: 9.5 g/dL — ABNORMAL LOW (ref 13.0–17.0)
MCH: 28.9 pg (ref 26.0–34.0)
MCHC: 31 g/dL (ref 30.0–36.0)
MCV: 93 fL (ref 78.0–100.0)
PLATELETS: 317 10*3/uL (ref 150–400)
RBC: 3.29 MIL/uL — AB (ref 4.22–5.81)
RDW: 15.7 % — ABNORMAL HIGH (ref 11.5–15.5)
WBC: 6.4 10*3/uL (ref 4.0–10.5)

## 2017-06-28 LAB — POCT ACTIVATED CLOTTING TIME: ACTIVATED CLOTTING TIME: 125 s

## 2017-06-28 LAB — BASIC METABOLIC PANEL
Anion gap: 10 (ref 5–15)
BUN: 36 mg/dL — AB (ref 6–20)
CALCIUM: 8.3 mg/dL — AB (ref 8.9–10.3)
CO2: 25 mmol/L (ref 22–32)
Chloride: 103 mmol/L (ref 101–111)
Creatinine, Ser: 8.91 mg/dL — ABNORMAL HIGH (ref 0.61–1.24)
GFR calc non Af Amer: 6 mL/min — ABNORMAL LOW (ref >60)
GFR, EST AFRICAN AMERICAN: 6 mL/min — AB (ref >60)
GLUCOSE: 112 mg/dL — AB (ref 65–99)
Potassium: 4.1 mmol/L (ref 3.5–5.1)
Sodium: 138 mmol/L (ref 135–145)

## 2017-06-28 LAB — GLUCOSE, CAPILLARY
GLUCOSE-CAPILLARY: 82 mg/dL (ref 65–99)
Glucose-Capillary: 112 mg/dL — ABNORMAL HIGH (ref 65–99)
Glucose-Capillary: 66 mg/dL (ref 65–99)
Glucose-Capillary: 89 mg/dL (ref 65–99)

## 2017-06-28 LAB — HEPARIN LEVEL (UNFRACTIONATED): Heparin Unfractionated: 0.24 IU/mL — ABNORMAL LOW (ref 0.30–0.70)

## 2017-06-28 LAB — MAGNESIUM: Magnesium: 2 mg/dL (ref 1.7–2.4)

## 2017-06-28 SURGERY — LEFT HEART CATH AND CORONARY ANGIOGRAPHY
Anesthesia: LOCAL

## 2017-06-28 MED ORDER — FENTANYL CITRATE (PF) 100 MCG/2ML IJ SOLN
INTRAMUSCULAR | Status: DC | PRN
  Administered 2017-06-28: 50 ug via INTRAVENOUS
  Administered 2017-06-28: 25 ug via INTRAVENOUS

## 2017-06-28 MED ORDER — FENTANYL CITRATE (PF) 100 MCG/2ML IJ SOLN
INTRAMUSCULAR | Status: AC
  Filled 2017-06-28: qty 2

## 2017-06-28 MED ORDER — IOHEXOL 350 MG/ML SOLN
INTRAVENOUS | Status: DC | PRN
  Administered 2017-06-28: 110 mL via INTRA_ARTERIAL

## 2017-06-28 MED ORDER — CHLORHEXIDINE GLUCONATE 4 % EX LIQD
60.0000 mL | Freq: Once | CUTANEOUS | Status: AC
  Administered 2017-06-29: 4 via TOPICAL
  Filled 2017-06-28: qty 60

## 2017-06-28 MED ORDER — SODIUM CHLORIDE 0.9% FLUSH
3.0000 mL | INTRAVENOUS | Status: DC | PRN
  Administered 2017-06-29 – 2017-06-30 (×2): 3 mL via INTRAVENOUS
  Filled 2017-06-28 (×2): qty 3

## 2017-06-28 MED ORDER — SODIUM CHLORIDE 0.9% FLUSH
3.0000 mL | Freq: Two times a day (BID) | INTRAVENOUS | Status: DC
  Administered 2017-06-29 – 2017-07-03 (×9): 3 mL via INTRAVENOUS

## 2017-06-28 MED ORDER — ACETAMINOPHEN 325 MG PO TABS
650.0000 mg | ORAL_TABLET | ORAL | Status: DC | PRN
  Administered 2017-06-28: 650 mg via ORAL
  Filled 2017-06-28 (×2): qty 2

## 2017-06-28 MED ORDER — SODIUM CHLORIDE 0.9 % IV SOLN
80.0000 mg | INTRAVENOUS | Status: AC
  Administered 2017-06-29: 80 mg

## 2017-06-28 MED ORDER — NITROGLYCERIN 1 MG/10 ML FOR IR/CATH LAB
INTRA_ARTERIAL | Status: DC | PRN
  Administered 2017-06-28: 200 ug via INTRACORONARY

## 2017-06-28 MED ORDER — VANCOMYCIN HCL IN DEXTROSE 1-5 GM/200ML-% IV SOLN
1000.0000 mg | INTRAVENOUS | Status: AC
  Administered 2017-06-29: 1000 mg via INTRAVENOUS

## 2017-06-28 MED ORDER — CHLORHEXIDINE GLUCONATE 4 % EX LIQD
60.0000 mL | Freq: Once | CUTANEOUS | Status: AC
  Administered 2017-06-28: 4 via TOPICAL
  Filled 2017-06-28: qty 60

## 2017-06-28 MED ORDER — SODIUM CHLORIDE 0.9 % IV SOLN: 250.0000 mL | INTRAVENOUS | Status: DC | PRN

## 2017-06-28 MED ORDER — ONDANSETRON HCL 4 MG/2ML IJ SOLN: 4.0000 mg | Freq: Four times a day (QID) | INTRAMUSCULAR | Status: DC | PRN

## 2017-06-28 MED ORDER — HEPARIN (PORCINE) IN NACL 2-0.9 UNITS/ML
INTRAMUSCULAR | Status: AC | PRN
  Administered 2017-06-28 (×2): 500 mL via INTRA_ARTERIAL

## 2017-06-28 MED ORDER — LIDOCAINE HCL (PF) 1 % IJ SOLN
INTRAMUSCULAR | Status: AC
  Filled 2017-06-28: qty 30

## 2017-06-28 MED ORDER — DIPHENHYDRAMINE HCL 50 MG/ML IJ SOLN
INTRAMUSCULAR | Status: DC | PRN
  Administered 2017-06-28: 12.5 mg via INTRAVENOUS

## 2017-06-28 MED ORDER — ACETAMINOPHEN 325 MG PO TABS
ORAL_TABLET | ORAL | Status: AC
  Filled 2017-06-28: qty 2

## 2017-06-28 MED ORDER — DIPHENHYDRAMINE HCL 50 MG/ML IJ SOLN
INTRAMUSCULAR | Status: AC
  Filled 2017-06-28: qty 1

## 2017-06-28 MED ORDER — SODIUM CHLORIDE 0.9 % IV SOLN
INTRAVENOUS | Status: DC
  Administered 2017-06-29: 07:00:00 via INTRAVENOUS

## 2017-06-28 MED ORDER — CALCITRIOL 0.5 MCG PO CAPS
ORAL_CAPSULE | ORAL | Status: AC
  Administered 2017-06-28: 0.5 ug via ORAL
  Filled 2017-06-28: qty 1

## 2017-06-28 MED ORDER — MIDAZOLAM HCL 2 MG/2ML IJ SOLN
INTRAMUSCULAR | Status: DC | PRN
  Administered 2017-06-28 (×2): 1 mg via INTRAVENOUS

## 2017-06-28 MED ORDER — LIDOCAINE HCL (PF) 1 % IJ SOLN
INTRAMUSCULAR | Status: DC | PRN
  Administered 2017-06-28: 15 mL via INTRADERMAL

## 2017-06-28 MED ORDER — MIDAZOLAM HCL 2 MG/2ML IJ SOLN
INTRAMUSCULAR | Status: AC
  Filled 2017-06-28: qty 2

## 2017-06-28 MED ORDER — HEPARIN (PORCINE) IN NACL 1000-0.9 UT/500ML-% IV SOLN
INTRAVENOUS | Status: AC
  Filled 2017-06-28: qty 1000

## 2017-06-28 MED ORDER — NITROGLYCERIN 1 MG/10 ML FOR IR/CATH LAB
INTRA_ARTERIAL | Status: AC
  Filled 2017-06-28: qty 10

## 2017-06-28 SURGICAL SUPPLY — 10 items
CATH INFINITI 5 FR MPA2 (CATHETERS) ×2 IMPLANT
CATH INFINITI 5FR JL4 (CATHETERS) ×2 IMPLANT
CATH INFINITI JR4 5F (CATHETERS) ×2 IMPLANT
COVER PRB 48X5XTLSCP FOLD TPE (BAG) ×1 IMPLANT
COVER PROBE 5X48 (BAG) ×1
KIT HEART LEFT (KITS) ×2 IMPLANT
PACK CARDIAC CATHETERIZATION (CUSTOM PROCEDURE TRAY) ×2 IMPLANT
SHEATH AVANTI 11CM 5FR (SHEATH) ×2 IMPLANT
TRANSDUCER W/STOPCOCK (MISCELLANEOUS) ×2 IMPLANT
WIRE EMERALD 3MM-J .035X150CM (WIRE) ×2 IMPLANT

## 2017-06-28 NOTE — Progress Notes (Signed)
PROGRESS NOTE    Richard Barnett  ZOX:[id number] DOB: [birth year] DOA: 06/24/2017 PCP: Patient, No Pcp Per     Brief Narrative:  Richard Barnett is a 62 year old Richard Barnett with PMH of CAD s/p stent 01/2017, ESRD on HD MWF (began 04/2017), depression, DM, GERD, HTN who presented to ED on 4/19 from dialysis center. EMS reports that patient received 3 out of 4 hours of dialysis then was noted to have loss of  consciousness. AED placed. Patient shocked 3 times. Received 10 minutes of CPR before return of ROSC. Upon arrival to ED patient is confused but awake with contusions to tongue. Initial EKG with sinus tachycardia. CT head negative. Patient was admitted to ICU service with cardiology and nephrology consultation. Transferred to [facility] 4/21. He underwent heart cath 4/23 and was recommended for ICD placement.   Assessment & Plan:   Principal Problem:   Cardiac arrest [facility]) Active Problems:   Chronic kidney disease, stage IV (severe) (HCC)   Myocardial infarction [facility])   NSTEMI (non-ST elevated myocardial infarction) (HCC)   ESRD on dialysis (HCC)   Suicidal ideation  Status post cardiac arrest -Cardiology following -Heart cath 4/23 showed diffusely diseased LAD with 40-50% narrowing -EP consulted, planning ICD this week   NSTEMI -Troponin peak of 5.9 but unclear if precipitating event for arrest vs demand ischemia resulting from arrest -S/p heart cath 4/23   Chronic diastolic dysfunction -Echo EF 81%, grade 1 dd   CAD status post stents -Continue aspirin, Plavix, Lipitor, lopressor   DM type 2 -SSI   ESRD on hemodialysis MWF -Nephrology following   Acute metabolic encephalopathy -Could be secondary to transient hypoperfusion after cardiac arrest -EEG with abnormal showing very mild generalized cerebral dysfunction with background slowing but no epileptiform features -Stable   Musculoskeletal CP -S/p CPR -Toradol and lidoderm patch    DVT prophylaxis: Heparin gtt Code Status:  Full Family Communication: No family at bedside Disposition Plan: ICD placement this week    Consultants:   PCCM admission  Nephrology  Cardiology  EP  Antimicrobials:  Anti-infectives (From admission, onward)   None       Subjective: Patient seen post heart cath, sitting in bed eating dinner. He continues to have anterior chest pain which worsens with movement.   Objective: Vitals:   06/28/17 1405 06/28/17 1540 06/28/17 1625 06/28/17 1642  BP: (!) 167/73 (!) 167/84 (!) 178/91 (!) 173/84  Pulse: 70 67 67   Resp: 16 18 17    Temp:    97.6 F (36.4 C)  TempSrc:    Oral  SpO2: 98% 100% 100% 100%  Weight:      Height:        Intake/Output Summary (Last 24 hours) at 06/28/2017 1800 Last data filed at 06/28/2017 1500 Gross per 24 hour  Intake 424.51 ml  Output 900 ml  Net -475.49 ml   Filed Weights   06/26/17 0500 06/26/17 2006 06/27/17 2025  Weight: 97.6 kg (215 lb 2.7 oz) 97.5 kg (215 lb) 97 kg (213 lb 13.5 oz)    Examination: General exam: Appears calm and comfortable  Respiratory system: Clear to auscultation. Respiratory effort normal. Cardiovascular system: S1 & S2 heard, RRR. No JVD, murmurs, rubs, gallops or clicks. No pedal edema. Gastrointestinal system: Abdomen is nondistended, soft and nontender. No organomegaly or masses felt. Normal bowel sounds heard. Central nervous system: Alert. No focal neurological deficits. Extremities: Symmetric 5 x 5 power. Skin: No rashes, lesions or ulcers Psychiatry: Judgement and insight  appear stable, still some confusion and does not remember meeting me the last 2 days    Data Reviewed: I have personally reviewed following labs and imaging studies  CBC: Recent Labs  Lab 06/24/17 1730 06/24/17 1737 06/25/17 0354 06/26/17 0048 06/27/17 0507 06/28/17 0333  WBC 11.0*  --  7.9 9.2 7.9 6.4  HGB 10.7* 12.9* 10.2* 9.7* 10.3* 9.5*  HCT 35.3* 38.0* 32.3* 30.7* 33.1* 30.6*  MCV 95.9  --  93.6 93.0 93.8 93.0  PLT  392  --  353 346 361 317   Basic Metabolic Panel: Recent Labs  Lab 06/24/17 1730  06/25/17 0550 06/25/17 0940 06/26/17 0048 06/27/17 0507 06/28/17 0333  NA 137   < > 135 137 135 137 138  K 3.1*   < > 4.0 3.7 3.4* 4.2 4.1  CL 94*   < > 95* 96* 95* 100* 103  CO2 19*  --  24 26 26 26 25   GLUCOSE 233*   < > 130* 116* 151* 99 112*  BUN 10   < > 16 16 24* 29* 36*  CREATININE 5.43*   < > 6.28* 6.65* 7.80* 8.26* 8.91*  CALCIUM 8.2*  --  8.5* 8.6* 8.3* 8.8* 8.3*  MG 2.2  --  1.9  --  1.9 2.0 2.0  PHOS  --   --  2.3*  --  4.2  --   --    < > = values in this interval not displayed.   GFR: Estimated Creatinine Clearance: 9.5 mL/min (A) (by C-G formula based on SCr of 8.91 mg/dL (H)). Liver Function Tests: Recent Labs  Lab 06/24/17 1730  AST 92*  ALT 65*  ALKPHOS 80  BILITOT 0.9  PROT 7.6  ALBUMIN 3.8   No results for input(s): LIPASE, AMYLASE in the last 168 hours. No results for input(s): AMMONIA in the last 168 hours. Coagulation Profile: Recent Labs  Lab 06/24/17 1730  INR 1.02   Cardiac Enzymes: Recent Labs  Lab 06/24/17 2008 06/25/17 0550 06/25/17 0940 06/26/17 0048  TROPONINI 0.64* 5.90* 5.51* 3.17*   BNP (last 3 results) No results for input(s): PROBNP in the last 8760 hours. HbA1C: No results for input(s): HGBA1C in the last 72 hours. CBG: Recent Labs  Lab 06/27/17 1155 06/27/17 1649 06/27/17 2135 06/28/17 0552 06/28/17 1738  GLUCAP 138* 101* 123* 112* 66   Lipid Profile: No results for input(s): CHOL, HDL, LDLCALC, TRIG, CHOLHDL, LDLDIRECT in the last 72 hours. Thyroid Function Tests: No results for input(s): TSH, T4TOTAL, FREET4, T3FREE, THYROIDAB in the last 72 hours. Anemia Panel: No results for input(s): VITAMINB12, FOLATE, FERRITIN, TIBC, IRON, RETICCTPCT in the last 72 hours. Sepsis Labs: Recent Labs  Lab 06/24/17 2014 06/24/17 2135 06/24/17 2324 06/25/17 0618 06/26/17 0048 06/27/17 0507  PROCALCITON  --  0.21  --   --  0.97 0.71    LATICACIDVEN 6.10*  --  5.2* 2.6* 2.3*  --     Recent Results (from the past 240 hour(s))  Culture, blood (routine x 2)     Status: None (Preliminary result)   Collection Time: 06/24/17  9:57 PM  Result Value Ref Range Status   Specimen Description BLOOD RIGHT ARM  Final   Special Requests   Final    BOTTLES DRAWN AEROBIC AND ANAEROBIC Blood Culture results may not be optimal due to an inadequate volume of blood received in culture bottles   Culture   Final    NO GROWTH 4 DAYS Performed at [facility], [address], [place], [place] [address]    Report Status PENDING  Incomplete  MRSA PCR Screening     Status: None   Collection Time: 06/24/17 10:33 PM  Result Value Ref Range Status   MRSA by PCR NEGATIVE NEGATIVE Final    Comment:        The GeneXpert MRSA Assay (FDA approved for NASAL specimens only), is one component of a comprehensive MRSA colonization surveillance program. It is not intended to diagnose MRSA infection nor to guide or monitor treatment for MRSA infections. Performed at [facility], [address], [place], [place] [address]   Culture, blood (routine x 2)     Status: None (Preliminary result)   Collection Time: 06/24/17 10:55 PM  Result Value Ref Range Status   Specimen Description BLOOD RIGHT FOOT  Final   Special Requests   Final    BOTTLES DRAWN AEROBIC ONLY Blood Culture results may not be optimal due to an inadequate volume of blood received in culture bottles   Culture   Final    NO GROWTH 3 DAYS Performed at [name], [address], [place], [place] [address]    Report Status PENDING  Incomplete       Radiology Studies: No results found.    Scheduled Meds: . aspirin EC  81 mg Oral Daily  . atorvastatin  80 mg Oral q1800  . calcitRIOL  0.5 mcg Oral Q M,W,F-HD  . calcitRIOL  0.5 mcg Oral Once  . clopidogrel  75 mg Oral Daily  . famotidine  20 mg Oral Daily  . insulin aspart  0-15 Units Subcutaneous TID WC   . lidocaine  1 patch Transdermal Q24H  . metoprolol tartrate  12.5 mg Oral BID  . multivitamin  1 tablet Oral QHS  . sevelamer carbonate  1,600 mg Oral TID WC  . sodium chloride flush  3 mL Intravenous Q12H   Continuous Infusions: . sodium chloride    . sodium chloride    . ferric gluconate (FERRLECIT/NULECIT) IV    . ferric gluconate (FERRLECIT/NULECIT) IV    . heparin Stopped (06/28/17 1751)     LOS: 4 days    Time spent: 20 minutes   Noralee Stain, DO [facility] [email or web address] Password [facility] 06/28/2017, 6:00 PM

## 2017-06-28 NOTE — Interval H&P Note (Signed)
History and Physical Interval Note:  06/28/2017 7:28 AM  Richard Barnett  has presented today for surgery, with the diagnosis of cp  The various methods of treatment have been discussed with the patient and family. After consideration of risks, benefits and other options for treatment, the patient has consented to  Procedure(s): LEFT HEART CATH AND CORONARY ANGIOGRAPHY (N/A) as a surgical intervention .  The patient's history has been reviewed, patient examined, no change in status, stable for surgery.  I have reviewed the patient's chart and labs.  Questions were answered to the patient's satisfaction.     Lyn Records III

## 2017-06-28 NOTE — Progress Notes (Signed)
HD tx completed '@2245'  w/o problem once he was able to be accessed (see note from 1845), UF goal met, blood rinsed back, VSS, report called to [place], Therapist, sports

## 2017-06-28 NOTE — Progress Notes (Signed)
HD tx initiated via 15Gx5, difficult stick, AVG is +/+, feel the thrill well, feels like right on top, pt stated he is deep, first art stick got blood return but very sluggish pull/push/flush, then w/ flush blood starting oozing fast out at stick site, pulled needle, attempt number 2 unsuccessful, not blood at all, went further up, no blood at all on stick 3, [place], RN attempt number 4, no blood return, pulled needle and pulled out very long clot, she was finally able to access AVG and pt tx initiated, VSS, will cont to monitor while on HD tx

## 2017-06-28 NOTE — Progress Notes (Signed)
Assessment/Plan: 1. Cardiac arrest -  For heart cath Wed; +h/o CAD s/p stent. Last Cath 01/2017 - no intervention required. ECHO 06/25/17: EF 60-65%, 2. ESRD - HD today. 5. HTN/volume - BP slightly elevated today.  7. Depression - Admit in March for SI. On Zoloft.  Subjective: Interval History: Plans for heart cath  Objective: Vital signs in last 24 hours: Temp:  [97.6 F (36.4 C)-98.6 F (37 C)] 97.6 F (36.4 C) (04/23 1642) Pulse Rate:  [66-87] 67 (04/23 1625) Resp:  [10-22] 17 (04/23 1625) BP: (129-181)/(58-103) 173/84 (04/23 1642) SpO2:  [98 %-100 %] 100 % (04/23 1642) Weight:  [97 kg (213 lb 13.5 oz)] 97 kg (213 lb 13.5 oz) (04/22 2025) Weight change: -0.523 kg (-1 lb 2.5 oz)  Intake/Output from previous day: 04/22 0701 - 04/23 0700 In: 1470 [P.O.:940; I.V.:530] Out: 600 [Urine:600] Intake/Output this shift: Total I/O In: -  Out: 400 [Urine:400]  General appearance: alert and cooperative  No change in exam  Lab Results: Recent Labs    06/27/17 0507 06/28/17 0333  WBC 7.9 6.4  HGB 10.3* 9.5*  HCT 33.1* 30.6*  PLT 361 317   BMET:  Recent Labs    06/27/17 0507 06/28/17 0333  NA 137 138  K 4.2 4.1  CL 100* 103  CO2 26 25  GLUCOSE 99 112*  BUN 29* 36*  CREATININE 8.26* 8.91*  CALCIUM 8.8* 8.3*   No results for input(s): PTH in the last 72 hours. Iron Studies: No results for input(s): IRON, TIBC, TRANSFERRIN, FERRITIN in the last 72 hours. Studies/Results: No results found.  Scheduled: . aspirin EC  81 mg Oral Daily  . atorvastatin  80 mg Oral q1800  . calcitRIOL  0.5 mcg Oral Q M,W,F-HD  . calcitRIOL  0.5 mcg Oral Once  . clopidogrel  75 mg Oral Daily  . famotidine  20 mg Oral Daily  . insulin aspart  0-15 Units Subcutaneous TID WC  . lidocaine  1 patch Transdermal Q24H  . metoprolol tartrate  12.5 mg Oral BID  . multivitamin  1 tablet Oral QHS  . sevelamer carbonate  1,600 mg Oral TID WC  . sodium chloride flush  3 mL Intravenous Q12H     LOS: 4 days   Lauris Poag 06/28/2017,6:04 PM

## 2017-06-28 NOTE — Anesthesia Preprocedure Evaluation (Addendum)
Anesthesia Evaluation  Patient identified by MRN, date of birth, ID band Patient awake    Reviewed: Allergy & Precautions, NPO status , Patient's Chart, lab work & pertinent test results, reviewed documented beta blocker date and time , Unable to perform ROS - Chart review only  Airway Mallampati: II       Dental  (+) Teeth Intact, Dental Advisory Given,    Pulmonary former smoker,    Pulmonary exam normal breath sounds clear to auscultation       Cardiovascular hypertension, Pt. on medications and Pt. on home beta blockers Normal cardiovascular exam Rhythm:Regular Rate:Normal     Neuro/Psych    GI/Hepatic   Endo/Other  diabetes  Renal/GU CRFRenal disease     Musculoskeletal   Abdominal (+) + obese,   Peds  Hematology  (+) Blood dyscrasia, anemia ,   Anesthesia Other Findings   Reproductive/Obstetrics                           Anesthesia Physical Anesthesia Plan  ASA: III  Anesthesia Plan: MAC   Post-op Pain Management:    Induction:   PONV Risk Score and Plan:   Airway Management Planned: Natural Airway, Nasal Cannula and Simple Face Mask  Additional Equipment:   Intra-op Plan:   Post-operative Plan:   Informed Consent: I have reviewed the patients History and Physical, chart, labs and discussed the procedure including the risks, benefits and alternatives for the proposed anesthesia with the patient or authorized representative who has indicated his/her understanding and acceptance.     Plan Discussed with: CRNA  Anesthesia Plan Comments:        Anesthesia Quick Evaluation

## 2017-06-28 NOTE — H&P (View-Only) (Signed)
ELECTROPHYSIOLOGY CONSULT NOTE    Patient ID: Richard Barnett MRN: [id number], DOB/AGE: Jul 12, 1955 62 y.o.  Admit date: 06/24/2017 Date of Consult: 06/28/2017  Primary Physician: Patient, No Pcp Per Primary Cardiologist: [facility] at [facility] Electrophysiologist: Ladona Ridgel (new this admission)  Patient Profile: Richard Barnett is a 62 y.o. Simone with a history of CAD, ESRD on HD who is being seen today for the evaluation of VF arrest at the request of Richard Barnett.  HPI:  Richard Barnett is a 62 y.o. Kye with the above past medical history. He was at dialysis the day of admission when he became unresponsive and was pulseless. Chest compressions were started. An AED was applied which delivered 3 shocks.  On EMS arrival, he was in ST with pulses and did not require intubation. He was transferred to [facility] for further evaluation. Troponin peaked at >5.  Catheterization did not demonstrate any targets for revascularization. EP has been asked to evaluate for treatment options  Echo 06/2017 demonstrated EF 60-65%, no RWMA, grade 1 diastolic dysfunction, PA pressure 28.   He denies chest pain, palpitations, dyspnea, PND, orthopnea, nausea, vomiting, dizziness, syncope, edema, weight gain, or early satiety. He does not remember anything around the events of the day of admission.   Past Medical History:  Diagnosis Date  . Arthritis    "right leg" (08/22/2014)  . CAD (coronary artery disease)    a. CAD s/p 2 stent placement in [place] 2016. b. Neg nuc 05/2014 & 01/2016.  Marland Kitchen CKD (chronic kidney disease), stage IV (HCC)    Richard Barnett 08/22/2014  . Daily headache   . Depression    "I always get that" (08/22/2014)  . Diabetes mellitus (HCC)   . DVT (deep venous thrombosis) (HCC) ?   RLE  . GERD (gastroesophageal reflux disease)   . Hypercholesterolemia   . Hypertension      Surgical History:  Past Surgical History:  Procedure Laterality Date  . AV FISTULA PLACEMENT Left 10/01/2016   Procedure:  INSERTION OF 4-7MM X 45CM ARTERIOVENOUS (AV) GORE-TEX GRAFT ARM;  Surgeon: Chuck Hint, MD;  Location: [facility] OR;  Service: Vascular;  Laterality: Left;  . CORONARY ANGIOPLASTY WITH STENT PLACEMENT     "1 + 1"  . DRAINAGE AND CLOSURE OF LYMPHOCELE Left 10/29/2016   Procedure: EVACUATION OF LYMPHOCELE LEFT ARM ARTERIOVENOUS GRAFT;  Surgeon: Chuck Hint, MD;  Location: [facility] OR;  Service: Vascular;  Laterality: Left;  . IR AV DIALY SHUNT INTRO NEEDLE/INTRACATH INITIAL W/PTA/IMG LEFT  05/17/2017  . IR DIALY SHUNT INTRO NEEDLE/INTRACATH INITIAL W/IMG LEFT Left 05/16/2017  . IR PTA ADDL CENTRAL DIALYSIS SEG THRU DIALY CIRCUIT LEFT Left 05/17/2017  . IR US GUIDE VASC ACCESS LEFT  05/17/2017  . LEFT HEART CATH AND CORONARY ANGIOGRAPHY N/A 01/14/2017   Procedure: LEFT HEART CATH AND CORONARY ANGIOGRAPHY;  Surgeon: Richard Barnett, Richard M, MD;  Location: [facility] INVASIVE CV LAB;  Service: Cardiovascular;  Laterality: N/A;     Medications Prior to Admission  Medication Sig Dispense Refill Last Dose  . amLODipine (NORVASC) 10 MG tablet Take 1 tablet (10 mg total) by mouth daily. 30 tablet 0 06/24/2017 at am  . atorvastatin (LIPITOR) 80 MG tablet Take 1 tablet (80 mg total) by mouth daily at 6 PM. 30 tablet 0 06/23/2017 at pm  . Brinzolamide-Brimonidine (SIMBRINZA) 1-0.2 % SUSP Place 1 drop into both eyes 2 (two) times daily.   06/24/2017 at Unknown time  . clopidogrel (PLAVIX) 75 MG tablet Take 1  tablet (75 mg total) by mouth daily. 30 tablet 0 06/24/2017 at 0930  . isosorbide mononitrate (IMDUR) 30 MG 24 hr tablet Take 1 tablet (30 mg total) by mouth daily. 30 tablet 0 06/24/2017 at am  . latanoprost (XALATAN) 0.005 % ophthalmic solution Place 1 drop into both eyes daily.   06/24/2017 at am  . metoprolol tartrate (LOPRESSOR) 50 MG tablet Take 1 tablet (50 mg total) by mouth 2 (two) times daily with a meal. 60 tablet 0 06/24/2017 at 0945  . nitroGLYCERIN (NITROSTAT) 0.4 MG SL tablet DISSOLVE 1 TABLET UNDER THE  TONGUE AS NEEDED FOR CHEST PAIN. REPEAT AS NEEDED EVERY 5 MINUTES UP TO A TOTAL OF 3 DOSES (Patient taking differently: DISSOLVE 1 TABLET (0.4 MG) UNDER THE TONGUE AS NEEDED FOR CHEST PAIN/REPEAT AS NEEDED EVERY 5 MINUTES UP TO A TOTAL OF 3 DOSES) 25 tablet 2 Unk at Unk  . sertraline (ZOLOFT) 50 MG tablet Take 1 tablet (50 mg total) by mouth at bedtime. 30 tablet 0 06/23/2017 at pm  . aspirin 81 MG EC tablet Take 1 tablet (81 mg total) by mouth daily. Swallow whole. (Patient not taking: Reported on 06/24/2017) 30 tablet 0 Not Taking at Unknown time  . oxyCODONE-acetaminophen (PERCOCET/ROXICET) 5-325 MG tablet Take 1 tablet by mouth every 6 (six) hours as needed. (Patient taking differently: Take 1 tablet by mouth every 6 (six) hours as needed (for pain). ) 6 tablet 0 Unk at Unk  . RENAGEL 800 MG tablet Take 1,600 mg by mouth 3 (three) times daily with meals.  4 Not yet at Not yet    Inpatient Medications:  . [MAR Hold] aspirin EC  81 mg Oral Daily  . [MAR Hold] atorvastatin  80 mg Oral q1800  . [MAR Hold] calcitRIOL  0.5 mcg Oral Q Barnett,W,F-HD  . [MAR Hold] calcitRIOL  0.5 mcg Oral Once  . [MAR Hold] clopidogrel  75 mg Oral Daily  . [MAR Hold] famotidine  20 mg Oral Daily  . [MAR Hold] insulin aspart  0-15 Units Subcutaneous TID WC  . [MAR Hold] lidocaine  1 patch Transdermal Q24H  . [MAR Hold] metoprolol tartrate  12.5 mg Oral BID  . [MAR Hold] multivitamin  1 tablet Oral QHS  . [MAR Hold] sevelamer carbonate  1,600 mg Oral TID WC  . sodium chloride flush  3 mL Intravenous Q12H    Allergies:  Allergies  Allergen Reactions  . Penicillins Anaphylaxis, Swelling and Rash    ANGIOEDEMA/"SWELLING OF ENTIRE BODY" Has patient had a PCN reaction causing immediate rash, facial/tongue/throat swelling, SOB or lightheadedness with hypotension: Yes Has patient had a PCN reaction causing severe rash involving mucus membranes or skin necrosis: No Has patient had a PCN reaction that required hospitalization:  No Has patient had a PCN reaction occurring within the last 10 years: No If all of the above answers are "NO", then may proceed with Cephalosporin use.     Social History   Socioeconomic History  . Marital status: Divorced    Spouse name: Not on file  . Number of children: Not on file  . Years of education: Not on file  . Highest education level: Not on file  Occupational History  . Not on file  Social Needs  . Financial resource strain: Not on file  . Food insecurity:    Worry: Not on file    Inability: Not on file  . Transportation needs:    Medical: Not on file    Non-medical: Not  on file  Tobacco Use  . Smoking status: Former Smoker    Packs/day: 0.00    Years: 40.00    Pack years: 0.00    Types: Cigarettes    Last attempt to quit: 08/29/2010    Years since quitting: 6.8  . Smokeless tobacco: Never Used  . Tobacco comment: "quit smoking cigarettes in  2011" 10/27/16  Substance and Sexual Activity  . Alcohol use: No    Comment: 10/27/16 "stopped in ~ 2011"  . Drug use: No    Types: "Crack" cocaine, Other-see comments    Comment: 10/27/16 "stopped in ~ 2011; all types of drugs""  . Sexual activity: Never  Lifestyle  . Physical activity:    Days per week: Not on file    Minutes per session: Not on file  . Stress: Not on file  Relationships  . Social connections:    Talks on phone: Not on file    Gets together: Not on file    Attends religious service: Not on file    Active member of club or organization: Not on file    Attends meetings of clubs or organizations: Not on file    Relationship status: Not on file  . Intimate partner violence:    Fear of current or ex partner: Not on file    Emotionally abused: Not on file    Physically abused: Not on file    Forced sexual activity: Not on file  Other Topics Concern  . Not on file  Social History Narrative  . Not on file     Family History  Problem Relation Age of Onset  . Hypertension Mother   . Kidney  disease Mother   . Heart disease Mother   . Heart disease Father   . Hypertension Father      Review of Systems: All other systems reviewed and are otherwise negative except as noted above.  Physical Exam: Vitals:   06/28/17 0840 06/28/17 0855 06/28/17 0915 06/28/17 0925  BP: (!) 141/81 (!) 147/82 (!) 150/86 (!) 181/81  Pulse: 69 66 78 76  Resp: (!) 21 18 20  (!) 22  Temp:      TempSrc:      SpO2: 100% 100% 99% 99%  Weight:      Height:        GEN- The patient is well appearing, alert and oriented x 3 today.   HEENT: normocephalic, atraumatic; sclera clear, conjunctiva pink; hearing intact; oropharynx clear; neck supple Lungs- Clear to ausculation bilaterally, normal work of breathing.  No wheezes, rales, rhonchi Heart- Regular rate and rhythm, no murmurs, rubs or gallops GI- soft, non-tender, non-distended, bowel sounds present Extremities- no clubbing, cyanosis, or edema; DP/PT/radial pulses 2+ bilaterally MS- no significant deformity or atrophy Skin- warm and dry, no rash or lesion Psych- euthymic mood, full affect Neuro- strength and sensation are intact  Labs:   Lab Results  Component Value Date   WBC 6.4 06/28/2017   HGB 9.5 (L) 06/28/2017   HCT 30.6 (L) 06/28/2017   MCV 93.0 06/28/2017   PLT 317 06/28/2017    Recent Labs  Lab 06/24/17 1730  06/28/17 0333  NA 137   < > 138  K 3.1*   < > 4.1  CL 94*   < > 103  CO2 19*   < > 25  BUN 10   < > 36*  CREATININE 5.43*   < > 8.91*  CALCIUM 8.2*   < > 8.3*  PROT 7.6  --   --  BILITOT 0.9  --   --   ALKPHOS 80  --   --   ALT 65*  --   --   AST 92*  --   --   GLUCOSE 233*   < > 112*   < > = values in this interval not displayed.      Radiology/Studies: Ct Head Wo Contrast  Result Date: 06/24/2017 CLINICAL DATA:  Status post cardiac arrest. Patient arrived from dialysis unresponsive and pulseless. Now having seizure-like activities. EXAM: CT HEAD WITHOUT CONTRAST TECHNIQUE: Contiguous axial images were  obtained from the base of the skull through the vertex without intravenous contrast. COMPARISON:  12/23/2016 head CT FINDINGS: Brain: Chronic moderate small vessel ischemic disease with chronic left basal ganglial lacunar infarct. No acute intracranial hemorrhage, midline shift or edema. Redemonstration of mild to moderate ventriculomegaly. No intra-axial mass nor extra-axial fluid collections. Midline fourth ventricle and basal cisterns. No effacement. No large vascular territory infarct. Vascular: No hyperdense vessel sign.  No unexpected calcifications. Skull: Intact bony calvarium.  Small left mastoid effusion. Sinuses/Orbits: Mild ethmoid sinus mucosal thickening. No air-fluid levels. Intact orbits and globes. Other: None IMPRESSION: Redemonstration of chronic moderate small vessel ischemic disease and left basal ganglial lacunar infarct. Central atrophy. No acute intracranial abnormality. Electronically Signed   By: Tollie Eth Barnett.D.   On: 06/24/2017 18:30   Dg Chest Portable 1 View  Result Date: 06/24/2017 CLINICAL DATA:  Status post cardiac arrest EXAM: PORTABLE CHEST 1 VIEW COMPARISON:  05/13/2017 FINDINGS: Cardiac shadow is prominent but accentuated by the portable technique. Lungs are well aerated bilaterally. No focal infiltrate or pneumothorax is seen. No acute bony abnormality is noted. IMPRESSION: No acute abnormality noted. Electronically Signed   By: Alcide Clever Barnett.D.   On: 06/24/2017 18:10    ZOX:[id number] rhythm (personally reviewed)  TELEMETRY: SR (personally reviewed)  Assessment/Plan: 1.  Cardiac arrest The patient has had a resuscitated cardiac arrest. Catheterization today did not show any clear targets for revascularization.  He meets criteria for ICD implant for secondary prevention.  Discussion with patient today regarding risks/benefits. He is willing to consider. Will need to discuss with daughter as well this afternoon with Dr Ladona Ridgel Tentatively planned for S-ICD tomorrow  morning.  NPO after midnight tonight.  No driving x6 months Keep V>4.0, Mg >1.8  2.  CAD Cath as above Will defer management to primary cardiology  3.  ESRD on HD Per nephrology  4.  AMS Improving slowly Some confusion at baseline   Dr Ladona Ridgel to see later today    Signed, Gypsy Balsam, NP 06/28/2017 10:01 AM   EP attending  Patient seen and examined.  Agree with the findings as noted above.  The patient has been resuscitated from ventricular fibrillation cardiac arrest.  He is hemodynamically stable.  Cardiac catheterization results are noted above.  I have discussed the treatment options with the patient.  The patient has undergone dialysis now for approximately 1 month.  He has a fistula in his left arm.  The risks, goals, benefits, and expectations of the procedure have been reviewed with the patient.  We will attempt to place a subcutaneous ICD in the next 24 hours.  I discussed the risks, goals, benefits, and expectations of the procedure and he is willing to proceed.   Lewayne Bunting, MD

## 2017-06-28 NOTE — Progress Notes (Signed)
Site area: Right groin a 5 french arterial sheath was removed  Site Prior to Removal:  Level 0  Pressure Applied For 20 MINUTES    Bedrest Beginning at 0845am  Manual:   Yes.    Patient Status During Pull:  stable  Post Pull Groin Site:  Level 0  Post Pull Instructions Given:  Yes.    Post Pull Pulses Present:  Yes.    Dressing Applied:  Yes.    Comments:  VS remain stable 

## 2017-06-28 NOTE — Consult Note (Addendum)
ELECTROPHYSIOLOGY CONSULT NOTE    Patient ID: Richard Barnett MRN: [id number], DOB/AGE: Jul 12, 1955 62 y.o.  Admit date: 06/24/2017 Date of Consult: 06/28/2017  Primary Physician: Patient, No Pcp Per Primary Cardiologist: [facility] at [facility] Electrophysiologist: Ladona Ridgel (new this admission)  Patient Profile: Richard Barnett is a 62 y.o. Richard Barnett with a history of CAD, ESRD on HD who is being seen today for the evaluation of VF arrest at the request of [place].  HPI:  Richard Barnett is a 62 y.o. Richard Barnett with the above past medical history. He was at dialysis the day of admission when he became unresponsive and was pulseless. Chest compressions were started. An AED was applied which delivered 3 shocks.  On EMS arrival, he was in ST with pulses and did not require intubation. He was transferred to [facility] for further evaluation. Troponin peaked at >5.  Catheterization did not demonstrate any targets for revascularization. EP has been asked to evaluate for treatment options  Echo 06/2017 demonstrated EF 60-65%, no RWMA, grade 1 diastolic dysfunction, PA pressure 28.   He denies chest pain, palpitations, dyspnea, PND, orthopnea, nausea, vomiting, dizziness, syncope, edema, weight gain, or early satiety. He does not remember anything around the events of the day of admission.   Past Medical History:  Diagnosis Date  . Arthritis    "right leg" (08/22/2014)  . CAD (coronary artery disease)    a. CAD s/p 2 stent placement in [place] 2016. b. Neg nuc 05/2014 & 01/2016.  Marland Kitchen CKD (chronic kidney disease), stage IV (HCC)    [name] 08/22/2014  . Daily headache   . Depression    "I always get that" (08/22/2014)  . Diabetes mellitus (HCC)   . DVT (deep venous thrombosis) (HCC) ?   RLE  . GERD (gastroesophageal reflux disease)   . Hypercholesterolemia   . Hypertension      Surgical History:  Past Surgical History:  Procedure Laterality Date  . AV FISTULA PLACEMENT Left 10/01/2016   Procedure:  INSERTION OF 4-7MM X 45CM ARTERIOVENOUS (AV) GORE-TEX GRAFT ARM;  Surgeon: Chuck Hint, MD;  Location: [facility] OR;  Service: Vascular;  Laterality: Left;  . CORONARY ANGIOPLASTY WITH STENT PLACEMENT     "1 + 1"  . DRAINAGE AND CLOSURE OF LYMPHOCELE Left 10/29/2016   Procedure: EVACUATION OF LYMPHOCELE LEFT ARM ARTERIOVENOUS GRAFT;  Surgeon: Chuck Hint, MD;  Location: [facility] OR;  Service: Vascular;  Laterality: Left;  . IR AV DIALY SHUNT INTRO NEEDLE/INTRACATH INITIAL W/PTA/IMG LEFT  05/17/2017  . IR DIALY SHUNT INTRO NEEDLE/INTRACATH INITIAL W/IMG LEFT Left 05/16/2017  . IR PTA ADDL CENTRAL DIALYSIS SEG THRU DIALY CIRCUIT LEFT Left 05/17/2017  . IR US GUIDE VASC ACCESS LEFT  05/17/2017  . LEFT HEART CATH AND CORONARY ANGIOGRAPHY N/A 01/14/2017   Procedure: LEFT HEART CATH AND CORONARY ANGIOGRAPHY;  Surgeon: [place], Peter M, MD;  Location: [facility] INVASIVE CV LAB;  Service: Cardiovascular;  Laterality: N/A;     Medications Prior to Admission  Medication Sig Dispense Refill Last Dose  . amLODipine (NORVASC) 10 MG tablet Take 1 tablet (10 mg total) by mouth daily. 30 tablet 0 06/24/2017 at am  . atorvastatin (LIPITOR) 80 MG tablet Take 1 tablet (80 mg total) by mouth daily at 6 PM. 30 tablet 0 06/23/2017 at pm  . Brinzolamide-Brimonidine (SIMBRINZA) 1-0.2 % SUSP Place 1 drop into both eyes 2 (two) times daily.   06/24/2017 at Unknown time  . clopidogrel (PLAVIX) 75 MG tablet Take 1  tablet (75 mg total) by mouth daily. 30 tablet 0 06/24/2017 at 0930  . isosorbide mononitrate (IMDUR) 30 MG 24 hr tablet Take 1 tablet (30 mg total) by mouth daily. 30 tablet 0 06/24/2017 at am  . latanoprost (XALATAN) 0.005 % ophthalmic solution Place 1 drop into both eyes daily.   06/24/2017 at am  . metoprolol tartrate (LOPRESSOR) 50 MG tablet Take 1 tablet (50 mg total) by mouth 2 (two) times daily with a meal. 60 tablet 0 06/24/2017 at 0945  . nitroGLYCERIN (NITROSTAT) 0.4 MG SL tablet DISSOLVE 1 TABLET UNDER THE  TONGUE AS NEEDED FOR CHEST PAIN. REPEAT AS NEEDED EVERY 5 MINUTES UP TO A TOTAL OF 3 DOSES (Patient taking differently: DISSOLVE 1 TABLET (0.4 MG) UNDER THE TONGUE AS NEEDED FOR CHEST PAIN/REPEAT AS NEEDED EVERY 5 MINUTES UP TO A TOTAL OF 3 DOSES) 25 tablet 2 Unk at Unk  . sertraline (ZOLOFT) 50 MG tablet Take 1 tablet (50 mg total) by mouth at bedtime. 30 tablet 0 06/23/2017 at pm  . aspirin 81 MG EC tablet Take 1 tablet (81 mg total) by mouth daily. Swallow whole. (Patient not taking: Reported on 06/24/2017) 30 tablet 0 Not Taking at Unknown time  . oxyCODONE-acetaminophen (PERCOCET/ROXICET) 5-325 MG tablet Take 1 tablet by mouth every 6 (six) hours as needed. (Patient taking differently: Take 1 tablet by mouth every 6 (six) hours as needed (for pain). ) 6 tablet 0 Unk at Unk  . RENAGEL 800 MG tablet Take 1,600 mg by mouth 3 (three) times daily with meals.  4 Not yet at Not yet    Inpatient Medications:  . [MAR Hold] aspirin EC  81 mg Oral Daily  . [MAR Hold] atorvastatin  80 mg Oral q1800  . [MAR Hold] calcitRIOL  0.5 mcg Oral Q M,W,F-HD  . [MAR Hold] calcitRIOL  0.5 mcg Oral Once  . [MAR Hold] clopidogrel  75 mg Oral Daily  . [MAR Hold] famotidine  20 mg Oral Daily  . [MAR Hold] insulin aspart  0-15 Units Subcutaneous TID WC  . [MAR Hold] lidocaine  1 patch Transdermal Q24H  . [MAR Hold] metoprolol tartrate  12.5 mg Oral BID  . [MAR Hold] multivitamin  1 tablet Oral QHS  . [MAR Hold] sevelamer carbonate  1,600 mg Oral TID WC  . sodium chloride flush  3 mL Intravenous Q12H    Allergies:  Allergies  Allergen Reactions  . Penicillins Anaphylaxis, Swelling and Rash    ANGIOEDEMA/"SWELLING OF ENTIRE BODY" Has patient had a PCN reaction causing immediate rash, facial/tongue/throat swelling, SOB or lightheadedness with hypotension: Yes Has patient had a PCN reaction causing severe rash involving mucus membranes or skin necrosis: No Has patient had a PCN reaction that required hospitalization:  No Has patient had a PCN reaction occurring within the last 10 years: No If all of the above answers are "NO", then may proceed with Cephalosporin use.     Social History   Socioeconomic History  . Marital status: Divorced    Spouse name: Not on file  . Number of children: Not on file  . Years of education: Not on file  . Highest education level: Not on file  Occupational History  . Not on file  Social Needs  . Financial resource strain: Not on file  . Food insecurity:    Worry: Not on file    Inability: Not on file  . Transportation needs:    Medical: Not on file    Non-medical: Not  on file  Tobacco Use  . Smoking status: Former Smoker    Packs/day: 0.00    Years: 40.00    Pack years: 0.00    Types: Cigarettes    Last attempt to quit: 08/29/2010    Years since quitting: 6.8  . Smokeless tobacco: Never Used  . Tobacco comment: "quit smoking cigarettes in  2011" 10/27/16  Substance and Sexual Activity  . Alcohol use: No    Comment: 10/27/16 "stopped in ~ 2011"  . Drug use: No    Types: "Crack" cocaine, Other-see comments    Comment: 10/27/16 "stopped in ~ 2011; all types of drugs""  . Sexual activity: Never  Lifestyle  . Physical activity:    Days per week: Not on file    Minutes per session: Not on file  . Stress: Not on file  Relationships  . Social connections:    Talks on phone: Not on file    Gets together: Not on file    Attends religious service: Not on file    Active member of club or organization: Not on file    Attends meetings of clubs or organizations: Not on file    Relationship status: Not on file  . Intimate partner violence:    Fear of current or ex partner: Not on file    Emotionally abused: Not on file    Physically abused: Not on file    Forced sexual activity: Not on file  Other Topics Concern  . Not on file  Social History Narrative  . Not on file     Family History  Problem Relation Age of Onset  . Hypertension Mother   . Kidney  disease Mother   . Heart disease Mother   . Heart disease Father   . Hypertension Father      Review of Systems: All other systems reviewed and are otherwise negative except as noted above.  Physical Exam: Vitals:   06/28/17 0840 06/28/17 0855 06/28/17 0915 06/28/17 0925  BP: (!) 141/81 (!) 147/82 (!) 150/86 (!) 181/81  Pulse: 69 66 78 76  Resp: (!) 21 18 20  (!) 22  Temp:      TempSrc:      SpO2: 100% 100% 99% 99%  Weight:      Height:        GEN- The patient is well appearing, alert and oriented x 3 today.   HEENT: normocephalic, atraumatic; sclera clear, conjunctiva pink; hearing intact; oropharynx clear; neck supple Lungs- Clear to ausculation bilaterally, normal work of breathing.  No wheezes, rales, rhonchi Heart- Regular rate and rhythm, no murmurs, rubs or gallops GI- soft, non-tender, non-distended, bowel sounds present Extremities- no clubbing, cyanosis, or edema; DP/PT/radial pulses 2+ bilaterally MS- no significant deformity or atrophy Skin- warm and dry, no rash or lesion Psych- euthymic mood, full affect Neuro- strength and sensation are intact  Labs:   Lab Results  Component Value Date   WBC 6.4 06/28/2017   HGB 9.5 (L) 06/28/2017   HCT 30.6 (L) 06/28/2017   MCV 93.0 06/28/2017   PLT 317 06/28/2017    Recent Labs  Lab 06/24/17 1730  06/28/17 0333  NA 137   < > 138  K 3.1*   < > 4.1  CL 94*   < > 103  CO2 19*   < > 25  BUN 10   < > 36*  CREATININE 5.43*   < > 8.91*  CALCIUM 8.2*   < > 8.3*  PROT 7.6  --   --  BILITOT 0.9  --   --   ALKPHOS 80  --   --   ALT 65*  --   --   AST 92*  --   --   GLUCOSE 233*   < > 112*   < > = values in this interval not displayed.      Radiology/Studies: Ct Head Wo Contrast  Result Date: 06/24/2017 CLINICAL DATA:  Status post cardiac arrest. Patient arrived from dialysis unresponsive and pulseless. Now having seizure-like activities. EXAM: CT HEAD WITHOUT CONTRAST TECHNIQUE: Contiguous axial images were  obtained from the base of the skull through the vertex without intravenous contrast. COMPARISON:  12/23/2016 head CT FINDINGS: Brain: Chronic moderate small vessel ischemic disease with chronic left basal ganglial lacunar infarct. No acute intracranial hemorrhage, midline shift or edema. Redemonstration of mild to moderate ventriculomegaly. No intra-axial mass nor extra-axial fluid collections. Midline fourth ventricle and basal cisterns. No effacement. No large vascular territory infarct. Vascular: No hyperdense vessel sign.  No unexpected calcifications. Skull: Intact bony calvarium.  Small left mastoid effusion. Sinuses/Orbits: Mild ethmoid sinus mucosal thickening. No air-fluid levels. Intact orbits and globes. Other: None IMPRESSION: Redemonstration of chronic moderate small vessel ischemic disease and left basal ganglial lacunar infarct. Central atrophy. No acute intracranial abnormality. Electronically Signed   By: Tollie Eth M.D.   On: 06/24/2017 18:30   Dg Chest Portable 1 View  Result Date: 06/24/2017 CLINICAL DATA:  Status post cardiac arrest EXAM: PORTABLE CHEST 1 VIEW COMPARISON:  05/13/2017 FINDINGS: Cardiac shadow is prominent but accentuated by the portable technique. Lungs are well aerated bilaterally. No focal infiltrate or pneumothorax is seen. No acute bony abnormality is noted. IMPRESSION: No acute abnormality noted. Electronically Signed   By: Alcide Clever M.D.   On: 06/24/2017 18:10    ZOX:WRUEA rhythm (personally reviewed)  TELEMETRY: SR (personally reviewed)  Assessment/Plan: 1.  Cardiac arrest The patient has had a resuscitated cardiac arrest. Catheterization today did not show any clear targets for revascularization.  He meets criteria for ICD implant for secondary prevention.  Discussion with patient today regarding risks/benefits. He is willing to consider. Will need to discuss with daughter as well this afternoon with Dr Ladona Ridgel Tentatively planned for S-ICD tomorrow  morning.  NPO after midnight tonight.  No driving x6 months Keep V>4.0, Mg >1.8  2.  CAD Cath as above Will defer management to primary cardiology  3.  ESRD on HD Per nephrology  4.  AMS Improving slowly Some confusion at baseline   Dr Ladona Ridgel to see later today    Signed, Gypsy Balsam, NP 06/28/2017 10:01 AM   EP attending  Patient seen and examined.  Agree with the findings as noted above.  The patient has been resuscitated from ventricular fibrillation cardiac arrest.  He is hemodynamically stable.  Cardiac catheterization results are noted above.  I have discussed the treatment options with the patient.  The patient has undergone dialysis now for approximately 1 month.  He has a fistula in his left arm.  The risks, goals, benefits, and expectations of the procedure have been reviewed with the patient.  We will attempt to place a subcutaneous ICD in the next 24 hours.  I discussed the risks, goals, benefits, and expectations of the procedure and he is willing to proceed.   Lewayne Bunting, MD

## 2017-06-28 NOTE — Progress Notes (Signed)
PT Cancellation Note  Patient Details Name: Richard Barnett MRN: [id number] DOB: [birth year]   Cancelled Treatment:    Reason Eval/Treat Not Completed: Patient at procedure or test/unavailable   Cath Lab today;   Will follow up later today as time allows;  Otherwise, will follow up for PT tomorrow;   Thank you,  Van Clines, PT  Acute Rehabilitation Services Pager [phone] Office [phone]     Levi Aland 06/28/2017, 7:58 AM

## 2017-06-28 NOTE — Progress Notes (Signed)
OT Cancellation Note  Patient Details Name: Richard Barnett MRN: [id number] DOB: 1956-02-15   Cancelled Treatment:    Reason Eval/Treat Not Completed: Patient at procedure or test/ unavailable. Pt off unit for procedure. Will check back as appropriate.   Doristine Section, MS OTR/L  Pager: [phone]   Doristine Section 06/28/2017, 7:39 AM

## 2017-06-28 NOTE — Care Management Note (Signed)
Case Management Note  Patient Details  Name: [name] MRN: [id number] Date of Birth: Jul 18, 1955  Subjective/Objective:  Admitted for Cardiac Arrest.                  Action/Plan: Active with [facility].  No PCP listed but patient has PCP with [facility].  Most of patient family is in [place].  Has personal care assistant at home.  Daughter comes in out.   NCM will follow for discharge transition needs.  Expected Discharge Date:   To Be Determined               Expected Discharge Plan:    To Be Determined In-House Referral:     Discharge planning Services   CM Consult  Status of Service:    In progress  Yancey Flemings, RN 06/28/2017, 9:50 AM

## 2017-06-29 ENCOUNTER — Inpatient Hospital Stay (HOSPITAL_COMMUNITY): Payer: Medicaid Other | Admitting: Anesthesiology

## 2017-06-29 ENCOUNTER — Encounter (HOSPITAL_COMMUNITY)
Admission: EM | Disposition: A | Discharge status: Skilled Nursing Facility | Payer: Self-pay | Source: Home / Self Care | Attending: Internal Medicine

## 2017-06-29 ENCOUNTER — Encounter (HOSPITAL_COMMUNITY): Payer: Self-pay | Admitting: Interventional Cardiology

## 2017-06-29 DIAGNOSIS — I4901 Ventricular fibrillation: Secondary | ICD-10-CM

## 2017-06-29 HISTORY — PX: SUBQ ICD IMPLANT: EP1223

## 2017-06-29 LAB — BASIC METABOLIC PANEL
Anion gap: 9 (ref 5–15)
BUN: 19 mg/dL (ref 6–20)
CHLORIDE: 99 mmol/L — AB (ref 101–111)
CO2: 30 mmol/L (ref 22–32)
CREATININE: 5.91 mg/dL — AB (ref 0.61–1.24)
Calcium: 8.3 mg/dL — ABNORMAL LOW (ref 8.9–10.3)
GFR calc Af Amer: 11 mL/min — ABNORMAL LOW (ref >60)
GFR calc non Af Amer: 9 mL/min — ABNORMAL LOW (ref >60)
GLUCOSE: 111 mg/dL — AB (ref 65–99)
POTASSIUM: 3.9 mmol/L (ref 3.5–5.1)
SODIUM: 138 mmol/L (ref 135–145)

## 2017-06-29 LAB — MAGNESIUM: MAGNESIUM: 2 mg/dL (ref 1.7–2.4)

## 2017-06-29 LAB — CBC
HEMATOCRIT: 32.1 % — AB (ref 39.0–52.0)
HEMOGLOBIN: 10.1 g/dL — AB (ref 13.0–17.0)
MCH: 29.3 pg (ref 26.0–34.0)
MCHC: 31.5 g/dL (ref 30.0–36.0)
MCV: 93 fL (ref 78.0–100.0)
Platelets: 301 10*3/uL (ref 150–400)
RBC: 3.45 MIL/uL — ABNORMAL LOW (ref 4.22–5.81)
RDW: 16 % — AB (ref 11.5–15.5)
WBC: 6.1 10*3/uL (ref 4.0–10.5)

## 2017-06-29 LAB — GLUCOSE, CAPILLARY
Glucose-Capillary: 70 mg/dL (ref 65–99)
Glucose-Capillary: 82 mg/dL (ref 65–99)
Glucose-Capillary: 85 mg/dL (ref 65–99)
Glucose-Capillary: 169 mg/dL — ABNORMAL HIGH (ref 65–99)

## 2017-06-29 LAB — SURGICAL PCR SCREEN
MRSA, PCR: NEGATIVE
Staphylococcus aureus: NEGATIVE

## 2017-06-29 LAB — CULTURE, BLOOD (ROUTINE X 2): Culture: NO GROWTH

## 2017-06-29 LAB — HEPARIN LEVEL (UNFRACTIONATED): Heparin Unfractionated: <0.1 IU/mL — ABNORMAL LOW (ref 0.30–0.70)

## 2017-06-29 SURGERY — SUBQ ICD IMPLANT
Anesthesia: Monitor Anesthesia Care

## 2017-06-29 MED ORDER — PENTAFLUOROPROP-TETRAFLUOROETH EX AERO: 1.0000 "application " | INHALATION_SPRAY | CUTANEOUS | Status: DC | PRN

## 2017-06-29 MED ORDER — BUPIVACAINE HCL (PF) 0.25 % IJ SOLN
INTRAMUSCULAR | Status: AC
  Filled 2017-06-29: qty 30

## 2017-06-29 MED ORDER — HEPARIN SODIUM (PORCINE) 1000 UNIT/ML DIALYSIS: 1000.0000 [IU] | INTRAMUSCULAR | Status: DC | PRN

## 2017-06-29 MED ORDER — LIDOCAINE-PRILOCAINE 2.5-2.5 % EX CREA: 1.0000 "application " | TOPICAL_CREAM | CUTANEOUS | Status: DC | PRN

## 2017-06-29 MED ORDER — MIDAZOLAM HCL 5 MG/5ML IJ SOLN
INTRAMUSCULAR | Status: DC | PRN
  Administered 2017-06-29 (×2): 1 mg via INTRAVENOUS

## 2017-06-29 MED ORDER — VANCOMYCIN HCL IN DEXTROSE 1-5 GM/200ML-% IV SOLN
1000.0000 mg | Freq: Two times a day (BID) | INTRAVENOUS | Status: AC
  Administered 2017-06-29: 1000 mg via INTRAVENOUS
  Filled 2017-06-29: qty 200

## 2017-06-29 MED ORDER — VANCOMYCIN HCL IN DEXTROSE 500-5 MG/100ML-% IV SOLN
INTRAVENOUS | Status: AC
  Filled 2017-06-29: qty 200

## 2017-06-29 MED ORDER — BUPIVACAINE HCL (PF) 0.25 % IJ SOLN
INTRAMUSCULAR | Status: DC | PRN
  Administered 2017-06-29: 120 mL

## 2017-06-29 MED ORDER — HEPARIN SODIUM (PORCINE) 1000 UNIT/ML DIALYSIS: 20.0000 [IU]/kg | INTRAMUSCULAR | Status: DC | PRN

## 2017-06-29 MED ORDER — BUPIVACAINE HCL (PF) 0.25 % IJ SOLN
INTRAMUSCULAR | Status: AC
Start: 2017-06-29 — End: 2017-06-29
  Filled 2017-06-29: qty 30

## 2017-06-29 MED ORDER — HEPARIN (PORCINE) IN NACL 2-0.9 UNITS/ML
INTRAMUSCULAR | Status: AC | PRN
  Administered 2017-06-29: 500 mL

## 2017-06-29 MED ORDER — SODIUM CHLORIDE 0.9 % IV SOLN: 100.0000 mL | INTRAVENOUS | Status: DC | PRN

## 2017-06-29 MED ORDER — FENTANYL CITRATE (PF) 100 MCG/2ML IJ SOLN
INTRAMUSCULAR | Status: DC | PRN
  Administered 2017-06-29 (×2): 25 ug via INTRAVENOUS

## 2017-06-29 MED ORDER — ONDANSETRON HCL 4 MG/2ML IJ SOLN: 4.0000 mg | Freq: Four times a day (QID) | INTRAMUSCULAR | Status: DC | PRN

## 2017-06-29 MED ORDER — OXYCODONE HCL 5 MG PO TABS
5.0000 mg | ORAL_TABLET | ORAL | Status: AC | PRN
  Administered 2017-06-29 – 2017-06-30 (×2): 5 mg via ORAL
  Filled 2017-06-29 (×2): qty 1

## 2017-06-29 MED ORDER — VANCOMYCIN HCL IN DEXTROSE 1-5 GM/200ML-% IV SOLN
INTRAVENOUS | Status: AC
  Filled 2017-06-29: qty 200

## 2017-06-29 MED ORDER — LIDOCAINE HCL (CARDIAC) PF 100 MG/5ML IV SOSY
PREFILLED_SYRINGE | INTRAVENOUS | Status: DC | PRN
  Administered 2017-06-29: 20 mg via INTRAVENOUS

## 2017-06-29 MED ORDER — FENTANYL CITRATE (PF) 100 MCG/2ML IJ SOLN
INTRAMUSCULAR | Status: AC
  Filled 2017-06-29: qty 2

## 2017-06-29 MED ORDER — PROPOFOL 500 MG/50ML IV EMUL
INTRAVENOUS | Status: DC | PRN
  Administered 2017-06-29: 100 ug/kg/min via INTRAVENOUS

## 2017-06-29 MED ORDER — PHENYLEPHRINE HCL 10 MG/ML IJ SOLN
INTRAMUSCULAR | Status: DC | PRN
  Administered 2017-06-29: 25 ug/min via INTRAVENOUS

## 2017-06-29 MED ORDER — CALCITRIOL 0.5 MCG PO CAPS
ORAL_CAPSULE | ORAL | Status: AC
  Administered 2017-06-29: 0.5 ug via ORAL
  Filled 2017-06-29: qty 1

## 2017-06-29 MED ORDER — HEPARIN SODIUM (PORCINE) 5000 UNIT/ML IJ SOLN
5000.0000 [IU] | Freq: Three times a day (TID) | INTRAMUSCULAR | Status: DC
  Administered 2017-06-29 – 2017-07-04 (×9): 5000 [IU] via SUBCUTANEOUS
  Filled 2017-06-29 (×10): qty 1

## 2017-06-29 MED ORDER — ACETAMINOPHEN 325 MG PO TABS
325.0000 mg | ORAL_TABLET | ORAL | Status: DC | PRN
  Administered 2017-06-29 – 2017-07-01 (×4): 650 mg via ORAL
  Filled 2017-06-29 (×3): qty 2

## 2017-06-29 MED ORDER — LIDOCAINE HCL (PF) 1 % IJ SOLN: 5.0000 mL | INTRAMUSCULAR | Status: DC | PRN

## 2017-06-29 MED ORDER — SODIUM CHLORIDE 0.9 % IV SOLN
INTRAVENOUS | Status: AC
  Filled 2017-06-29: qty 2

## 2017-06-29 MED ORDER — MIDAZOLAM HCL 5 MG/5ML IJ SOLN
INTRAMUSCULAR | Status: AC
  Filled 2017-06-29: qty 5

## 2017-06-29 MED FILL — Heparin Sod (Porcine)-NaCl IV Soln 1000 Unit/500ML-0.9%: INTRAVENOUS | Qty: 1000 | Status: AC

## 2017-06-29 SURGICAL SUPPLY — 5 items
CABLE SURGICAL S-101-97-12 (CABLE) ×3 IMPLANT
ICD SUBCU MRI EMBLEM A219 (ICD Generator) ×3 IMPLANT
LEAD SUBQU EMBLEM 3501 (Pacemaker) ×3 IMPLANT
PAD DEFIB LIFELINK (PAD) ×6 IMPLANT
TRAY PACEMAKER INSERTION (PACKS) ×3 IMPLANT

## 2017-06-29 NOTE — Anesthesia Postprocedure Evaluation (Signed)
Anesthesia Post Note  Patient: Richard Barnett  Procedure(s) Performed: SUBQ ICD IMPLANT (N/A )     Patient location during evaluation: Cath Lab Anesthesia Type: MAC Level of consciousness: awake Pain management: pain level controlled Vital Signs Assessment: post-procedure vital signs reviewed and stable Respiratory status: spontaneous breathing Cardiovascular status: stable Postop Assessment: no apparent nausea or vomiting Anesthetic complications: no    Last Vitals:  Vitals:   06/29/17 0512 06/29/17 1106  BP:    Pulse: 77   Resp: 16   Temp: (!) 36.4 C (!) 36.3 C  SpO2: 100%     Last Pain:  Vitals:   06/29/17 1059  TempSrc:   PainSc: 0-No pain   Pain Goal: Patients Stated Pain Goal: 0 (06/29/17 0032)               Gagandeep Pettet JR,[name]

## 2017-06-29 NOTE — Progress Notes (Signed)
[facility] Progress Note   Subjective:   S/p ICD implant this am Feels sore  Objective Vitals:   06/29/17 1205 06/29/17 1220 06/29/17 1235 06/29/17 1240  BP: (!) 158/51 (!) 158/48 (!) 159/52   Pulse: 69 66 66   Resp: 11 13 11    Temp:    (!) 97.3 F (36.3 C)  TempSrc:      SpO2: 100% 99% 99%   Weight:      Height:       Physical Exam General:NAD, WDWN, Sandro sitting up in bed Heart:RRR, no mrg Lungs:CTAB Abdomen:soft, NTND, +BS Extremities:no LE edema Dialysis Access: LU AVG +bruit    Dialysis Orders: [facility] MWF 4h 98kg 2/2 bath 400/800 Hep 5000 LUE AVG Mircera 150 mcg q 2 weeks, last given 4/10 Venofer 100 mg q 5 rx, started 4/17 Calcitriol 0.5 mcg q rx  Assessment/Plan: 1. Cardiac arrest - unclear etiology.  LHC on 06/28/17-largely unchanged from 2018 study - no targets for intervention. ECHO 06/25/17: EF 60-65%, grade 1 DD. S/p ICD implant this am  2. ESRD - HD today, continue per regular MWF schedule.   3. Anemia of CKD- Hgb 10.1. ESA not indicated.  Follow trend Continue iron load w/HD. 4. Secondary hyperparathyroidism - Ca & P in goal. Continue VDRA & binders. 5. HTN/volume - BP slightly elevated today. On lopressor. UF to EDW as tolerated  6. Nutrition - Renal/carb modified diet. Renavite.  7. Depression - Admit in March for SI. On Zoloft.   Tomasa Blase PA-C [facility] Pager [phone] 06/29/2017,12:57 PM  LOS: 5 days   Additional Objective Labs: Basic Metabolic Panel: Recent Labs  Lab 06/25/17 0550  06/26/17 0048 06/27/17 0507 06/28/17 0333 06/29/17 0403  NA 135   < > 135 137 138 138  K 4.0   < > 3.4* 4.2 4.1 3.9  CL 95*   < > 95* 100* 103 99*  CO2 24   < > 26 26 25 30   GLUCOSE 130*   < > 151* 99 112* 111*  BUN 16   < > 24* 29* 36* 19  CREATININE 6.28*   < > 7.80* 8.26* 8.91* 5.91*  CALCIUM 8.5*   < > 8.3* 8.8* 8.3* 8.3*  PHOS 2.3*  --  4.2  --   --   --    < > = values in this interval not  displayed.   CBC: Recent Labs  Lab 06/25/17 0354 06/26/17 0048 06/27/17 0507 06/28/17 0333 06/29/17 0403  WBC 7.9 9.2 7.9 6.4 6.1  HGB 10.2* 9.7* 10.3* 9.5* 10.1*  HCT 32.3* 30.7* 33.1* 30.6* 32.1*  MCV 93.6 93.0 93.8 93.0 93.0  PLT 353 346 361 317 301   Blood Culture    Component Value Date/Time   SDES BLOOD RIGHT FOOT 06/24/2017 2255   SPECREQUEST  06/24/2017 2255    BOTTLES DRAWN AEROBIC ONLY Blood Culture results may not be optimal due to an inadequate volume of blood received in culture bottles   CULT  06/24/2017 2255    NO GROWTH 3 DAYS Performed at [facility], [address] [place], [place] [address]    REPTSTATUS PENDING 06/24/2017 2255    Cardiac Enzymes: Recent Labs  Lab 06/24/17 2008 06/25/17 0550 06/25/17 0940 06/26/17 0048  TROPONINI 0.64* 5.90* 5.51* 3.17*   CBG: Recent Labs  Lab 06/28/17 1738 06/28/17 1812 06/28/17 2338 06/29/17 0730 06/29/17 1123  GLUCAP 66 82 89 70 82   Iron Studies: No results  for input(s): IRON, TIBC, TRANSFERRIN, FERRITIN in the last 72 hours. Lab Results  Component Value Date   INR 1.02 06/24/2017   INR 0.98 05/13/2017   INR 0.99 01/14/2017   Medications: . sodium chloride    . sodium chloride    . ferric gluconate (FERRLECIT/NULECIT) IV    . heparin Stopped (06/28/17 1610)  . vancomycin     . aspirin EC  81 mg Oral Daily  . atorvastatin  80 mg Oral q1800  . calcitRIOL  0.5 mcg Oral Q M,W,F-HD  . calcitRIOL  0.5 mcg Oral Once  . clopidogrel  75 mg Oral Daily  . famotidine  20 mg Oral Daily  . insulin aspart  0-15 Units Subcutaneous TID WC  . lidocaine  1 patch Transdermal Q24H  . metoprolol tartrate  12.5 mg Oral BID  . multivitamin  1 tablet Oral QHS  . sevelamer carbonate  1,600 mg Oral TID WC  . sodium chloride flush  3 mL Intravenous Q12H

## 2017-06-29 NOTE — Anesthesia Procedure Notes (Signed)
Procedure Name: MAC Date/Time: 06/29/2017 8:55 AM Performed by: Kyung Rudd, CRNA Pre-anesthesia Checklist: Patient identified, Emergency Drugs available, Suction available and Patient being monitored Patient Re-evaluated:Patient Re-evaluated prior to induction Induction Type: IV induction Placement Confirmation: positive ETCO2 Dental Injury: Teeth and Oropharynx as per pre-operative assessment

## 2017-06-29 NOTE — Progress Notes (Signed)
PROGRESS NOTE    Lauris BERWYN BIGLEY  AVW:[id number] DOB: 1956-01-16 DOA: 06/24/2017 PCP: Patient, No Pcp Per     Brief Narrative:  Sisto KYAIRE GRUENEWALD is a 62 year old [place] with PMH of CAD s/p stent 01/2017, ESRD on HD MWF (began 04/2017), depression, DM, GERD, HTN who presented to ED on 4/19 from dialysis center. EMS reports that patient received 3 out of 4 hours of dialysis then was noted to have loss of  consciousness. AED placed. Patient shocked 3 times. Received 10 minutes of CPR before return of ROSC. Upon arrival to ED patient is confused but awake with contusions to tongue. Initial EKG with sinus tachycardia. CT head negative. Patient was admitted to ICU service with cardiology and nephrology consultation. Transferred to [facility] 4/21. He underwent heart cath 4/23 and subsequently had ICD placed 4/24.   Assessment & Plan:   Principal Problem:   Cardiac arrest [facility]) Active Problems:   Chronic kidney disease, stage IV (severe) (HCC)   Myocardial infarction (HCC)   NSTEMI (non-ST elevated myocardial infarction) (HCC)   ESRD on dialysis (HCC)   Suicidal ideation  Status post cardiac arrest -Cardiology following -Heart cath 4/23 showed diffusely diseased LAD with 40-50% narrowing -EP consulted, ICD placed 4/24   NSTEMI -Troponin peak of 5.9 but unclear if precipitating event for arrest vs demand ischemia resulting from arrest -S/p heart cath 4/23   Chronic diastolic dysfunction -Echo EF 82%, grade 1 dd   CAD status post stents -Continue aspirin, Plavix, Lipitor, lopressor   DM type 2 -SSI   ESRD on hemodialysis MWF -Nephrology following  -HD today   Acute metabolic encephalopathy -Could be secondary to transient hypoperfusion after cardiac arrest -EEG with abnormal showing very mild generalized cerebral dysfunction with background slowing but no epileptiform features -Stable   Musculoskeletal CP -S/p CPR -CXR on 4/19 without acute abnormality  -Toradol and lidoderm patch     DVT prophylaxis: Heparin subq  Code Status: Full Family Communication: No family at bedside Disposition Plan: Discharge home once okay with cardiology, nephrology    Consultants:   PCCM admission  Nephrology  Cardiology  EP  Antimicrobials:  Anti-infectives (From admission, onward)   Start     Dose/Rate Route Frequency Ordered Stop   06/29/17 1945  vancomycin (VANCOCIN) IVPB 1000 mg/200 mL premix     1,000 mg 200 mL/hr over 60 Minutes Intravenous Every 12 hours 06/29/17 1249 06/30/17 0744   06/29/17 0600  gentamicin (GARAMYCIN) 80 mg in sodium chloride 0.9 % 500 mL irrigation     80 mg Irrigation On call 06/28/17 2330 06/29/17 0955   06/29/17 0600  vancomycin (VANCOCIN) IVPB 1000 mg/200 mL premix     1,000 mg 200 mL/hr over 60 Minutes Intravenous On call 06/28/17 2330 06/29/17 1010       Subjective: Patient seen in HD, post ICD placement. Continues to complain of central chest pain, no other issues.   Objective: Vitals:   06/29/17 1220 06/29/17 1235 06/29/17 1240 06/29/17 1410  BP: (!) 158/48 (!) 159/52  (!) 147/86  Pulse: 66 66  70  Resp: 13 11  16   Temp:   (!) 97.3 F (36.3 C) (!) 97.4 F (36.3 C)  TempSrc:    Oral  SpO2: 99% 99%  99%  Weight:    101.8 kg (224 lb 6.9 oz)  Height:        Intake/Output Summary (Last 24 hours) at 06/29/2017 1511 Last data filed at 06/29/2017 1045 Gross per 24 hour  Intake 400 ml  Output 2225 ml  Net -1825 ml   Filed Weights   06/28/17 2308 06/29/17 0515 06/29/17 1410  Weight: 98.4 kg (216 lb 14.9 oz) 97.8 kg (215 lb 8 oz) 101.8 kg (224 lb 6.9 oz)    Examination: General exam: Appears calm and comfortable  Respiratory system: Clear to auscultation. Respiratory effort normal. Cardiovascular system: S1 & S2 heard, RRR. No JVD, murmurs, rubs, gallops or clicks.  Gastrointestinal system: Abdomen is nondistended, soft and nontender. No organomegaly or masses felt. Normal bowel sounds heard. Central nervous system: Alert  and oriented. No focal neurological deficits. Extremities: Symmetric Skin: No rashes, lesions or ulcers on exposed skin  Psychiatry: Judgement and insight appear normal. Mood & affect appropriate.    Data Reviewed: I have personally reviewed following labs and imaging studies  CBC: Recent Labs  Lab 06/25/17 0354 06/26/17 0048 06/27/17 0507 06/28/17 0333 06/29/17 0403  WBC 7.9 9.2 7.9 6.4 6.1  HGB 10.2* 9.7* 10.3* 9.5* 10.1*  HCT 32.3* 30.7* 33.1* 30.6* 32.1*  MCV 93.6 93.0 93.8 93.0 93.0  PLT 353 346 361 317 301   Basic Metabolic Panel: Recent Labs  Lab 06/25/17 0550 06/25/17 0940 06/26/17 0048 06/27/17 0507 06/28/17 0333 06/29/17 0403  NA 135 137 135 137 138 138  K 4.0 3.7 3.4* 4.2 4.1 3.9  CL 95* 96* 95* 100* 103 99*  CO2 24 26 26 26 25 30   GLUCOSE 130* 116* 151* 99 112* 111*  BUN 16 16 24* 29* 36* 19  CREATININE 6.28* 6.65* 7.80* 8.26* 8.91* 5.91*  CALCIUM 8.5* 8.6* 8.3* 8.8* 8.3* 8.3*  MG 1.9  --  1.9 2.0 2.0 2.0  PHOS 2.3*  --  4.2  --   --   --    GFR: Estimated Creatinine Clearance: 14.7 mL/min (A) (by C-G formula based on SCr of 5.91 mg/dL (H)). Liver Function Tests: Recent Labs  Lab 06/24/17 1730  AST 92*  ALT 65*  ALKPHOS 80  BILITOT 0.9  PROT 7.6  ALBUMIN 3.8   No results for input(s): LIPASE, AMYLASE in the last 168 hours. No results for input(s): AMMONIA in the last 168 hours. Coagulation Profile: Recent Labs  Lab 06/24/17 1730  INR 1.02   Cardiac Enzymes: Recent Labs  Lab 06/24/17 2008 06/25/17 0550 06/25/17 0940 06/26/17 0048  TROPONINI 0.64* 5.90* 5.51* 3.17*   BNP (last 3 results) No results for input(s): PROBNP in the last 8760 hours. HbA1C: No results for input(s): HGBA1C in the last 72 hours. CBG: Recent Labs  Lab 06/28/17 1812 06/28/17 2338 06/29/17 0730 06/29/17 1123 06/29/17 1311  GLUCAP 82 89 70 82 85   Lipid Profile: No results for input(s): CHOL, HDL, LDLCALC, TRIG, CHOLHDL, LDLDIRECT in the last 72  hours. Thyroid Function Tests: No results for input(s): TSH, T4TOTAL, FREET4, T3FREE, THYROIDAB in the last 72 hours. Anemia Panel: No results for input(s): VITAMINB12, FOLATE, FERRITIN, TIBC, IRON, RETICCTPCT in the last 72 hours. Sepsis Labs: Recent Labs  Lab 06/24/17 2014 06/24/17 2135 06/24/17 2324 06/25/17 0618 06/26/17 0048 06/27/17 0507  PROCALCITON  --  0.21  --   --  0.97 0.71  LATICACIDVEN 6.10*  --  5.2* 2.6* 2.3*  --     Recent Results (from the past 240 hour(s))  Culture, blood (routine x 2)     Status: None (Preliminary result)   Collection Time: 06/24/17  9:57 PM  Result Value Ref Range Status   Specimen Description BLOOD RIGHT ARM  Final  Special Requests   Final    BOTTLES DRAWN AEROBIC AND ANAEROBIC Blood Culture results may not be optimal due to an inadequate volume of blood received in culture bottles   Culture   Final    NO GROWTH 4 DAYS Performed at [facility] Lab, [address], [place], [place] [address]    Report Status PENDING  Incomplete  MRSA PCR Screening     Status: None   Collection Time: 06/24/17 10:33 PM  Result Value Ref Range Status   MRSA by PCR NEGATIVE NEGATIVE Final    Comment:        The GeneXpert MRSA Assay (FDA approved for NASAL specimens only), is one component of a comprehensive MRSA colonization surveillance program. It is not intended to diagnose MRSA infection nor to guide or monitor treatment for MRSA infections. Performed at [facility], [address], [place], [place] [address]   Culture, blood (routine x 2)     Status: None (Preliminary result)   Collection Time: 06/24/17 10:55 PM  Result Value Ref Range Status   Specimen Description BLOOD RIGHT FOOT  Final   Special Requests   Final    BOTTLES DRAWN AEROBIC ONLY Blood Culture results may not be optimal due to an inadequate volume of blood received in culture bottles   Culture   Final    NO GROWTH 3 DAYS Performed at [facility], [address], [place], [place] [address]    Report Status PENDING  Incomplete  Surgical PCR screen     Status: None   Collection Time: 06/29/17 12:14 AM  Result Value Ref Range Status   MRSA, PCR NEGATIVE NEGATIVE Final   Staphylococcus aureus NEGATIVE NEGATIVE Final    Comment: (NOTE) The Xpert SA Assay (FDA approved for NASAL specimens in patients 53 years of age and older), is one component of a comprehensive surveillance program. It is not intended to diagnose infection nor to guide or monitor treatment. Performed at [facility], [address], [place], [place] [address]        Radiology Studies: No results found.    Scheduled Meds: . aspirin EC  81 mg Oral Daily  . atorvastatin  80 mg Oral q1800  . calcitRIOL  0.5 mcg Oral Q M,W,F-HD  . calcitRIOL  0.5 mcg Oral Once  . clopidogrel  75 mg Oral Daily  . famotidine  20 mg Oral Daily  . insulin aspart  0-15 Units Subcutaneous TID WC  . lidocaine  1 patch Transdermal Q24H  . metoprolol tartrate  12.5 mg Oral BID  . multivitamin  1 tablet Oral QHS  . sevelamer carbonate  1,600 mg Oral TID WC  . sodium chloride flush  3 mL Intravenous Q12H   Continuous Infusions: . sodium chloride    . sodium chloride    . sodium chloride    . sodium chloride    . ferric gluconate (FERRLECIT/NULECIT) IV    . vancomycin       LOS: 5 days    Time spent: 20 minutes   Noralee Stain, DO [facility] [email or web address] Password [email or web address] 06/29/2017, 3:11 PM

## 2017-06-29 NOTE — Discharge Instructions (Signed)
° ° °  Supplemental Discharge Instructions for  Pacemaker/Defibrillator Patients   WOUND CARE - Keep the wound area clean and dry.  Do not get this area wet for one week. No showers for one week; you may shower on   07/06/17  . - The tape/steri-strips on your wound will fall off; do not pull them off.  No bandage is needed on the site.  DO  NOT apply any creams, oils, or ointments to the wound area. - If you notice any drainage or discharge from the wound, any swelling or bruising at the site, or you develop a fever > 101? F after you are discharged home, call the office at once.  Special Instructions - You are still able to use cellular telephones; use the ear opposite the side where you have your pacemaker/defibrillator.  Avoid carrying your cellular phone near your device. - When traveling through airports, show security personnel your identification card to avoid being screened in the metal detectors.  Ask the security personnel to use the hand wand. - Avoid arc welding equipment, MRI testing (magnetic resonance imaging), TENS units (transcutaneous nerve stimulators).  Call the office for questions about other devices. - Avoid electrical appliances that are in poor condition or are not properly grounded. - Microwave ovens are safe to be near or to operate.  Additional information for defibrillator patients should your device go off: - If your device goes off ONCE and you feel fine afterward, notify the device clinic nurses. - If your device goes off ONCE and you do not feel well afterward, call 911. - If your device goes off TWICE, call 911. - If your device goes off THREE times in one day, call 911.  DO NOT DRIVE YOURSELF OR A FAMILY MEMBER WITH A DEFIBRILLATOR TO THE HOSPITAL--CALL 911.

## 2017-06-29 NOTE — Progress Notes (Signed)
PT Cancellation Note  Patient Details Name: Richard Barnett MRN: [id number] DOB: [birth year]   Cancelled Treatment:    Reason Eval/Treat Not Completed: Patient at procedure or test/unavailable. Pt in OR.    Angelina Ok Maycok 06/29/2017, 8:20 AM  Skip Mayer PT [phone]

## 2017-06-29 NOTE — Transfer of Care (Signed)
Immediate Anesthesia Transfer of Care Note  Patient: Richard Barnett  Procedure(s) Performed: SUBQ ICD IMPLANT (N/A )  Patient Location: Cath Lab  Anesthesia Type:MAC  Level of Consciousness: awake, alert  and oriented  Airway & Oxygen Therapy: Patient Spontanous Breathing and Patient connected to nasal cannula oxygen  Post-op Assessment: Report given to RN, Post -op Vital signs reviewed and stable and Patient moving all extremities X 4  Post vital signs: Reviewed and stable  Last Vitals:  Vitals Value Taken Time  BP    Temp    Pulse 69 06/29/2017 11:02 AM  Resp 14 06/29/2017 11:02 AM  SpO2 99 % 06/29/2017 11:02 AM  Vitals shown include unvalidated device data.  Last Pain:  Vitals:   06/29/17 1059  TempSrc:   PainSc: 0-No pain      Patients Stated Pain Goal: 0 (58/83/25 4982)  Complications: No apparent anesthesia complications

## 2017-06-29 NOTE — Anesthesia Postprocedure Evaluation (Deleted)
Anesthesia Post Note  Patient: Richard Barnett  Procedure(s) Performed: SUBQ ICD IMPLANT (N/A )     Patient location during evaluation: Cath Lab Anesthesia Type: MAC Level of consciousness: awake Pain management: pain level controlled Vital Signs Assessment: post-procedure vital signs reviewed and stable Respiratory status: spontaneous breathing Cardiovascular status: stable Postop Assessment: no apparent nausea or vomiting Anesthetic complications: no    Last Vitals:  Vitals:   06/29/17 0511 06/29/17 0512  BP: (!) 145/85   Pulse:  77  Resp:  16  Temp:  (!) 36.4 C  SpO2:  100%    Last Pain:  Vitals:   06/29/17 0512  TempSrc: Oral  PainSc:    Pain Goal: Patients Stated Pain Goal: 0 (06/29/17 0032)               Richard Barnett,Richard Barnett

## 2017-06-29 NOTE — Progress Notes (Signed)
OT Cancellation Note  Patient Details Name: Richard Barnett MRN: [id number] DOB: Nov 27, 1955   Cancelled Treatment:    Reason Eval/Treat Not Completed: Patient at procedure or test/ unavailable. Pt in OR at this time. Will check back as able and appropriate to initiate OT evaluation.   Doristine Section, MS OTR/L  Pager: [phone]   Donnarae Rae A Rachella Basden 06/29/2017, 9:00 AM

## 2017-06-29 NOTE — Interval H&P Note (Signed)
History and Physical Interval Note:  06/29/2017 7:30 AM  Richard Barnett  has presented today for surgery, with the diagnosis of hb  The various methods of treatment have been discussed with the patient and family. After consideration of risks, benefits and other options for treatment, the patient has consented to  Procedure(s): SUBQ ICD IMPLANT (N/A) as a surgical intervention .  The patient's history has been reviewed, patient examined, no change in status, stable for surgery.  I have reviewed the patient's chart and labs.  Questions were answered to the patient's satisfaction.     Lewayne Bunting

## 2017-06-29 NOTE — Progress Notes (Addendum)
Electrophysiology Rounding Note  Patient Name: Richard Barnett Date of Encounter: 06/29/2017  Primary Cardiologist: [facility] Electrophysiologist: Ladona Ridgel   Subjective   The patient is doing well today.  Still with chest soreness from compressions.   Inpatient Medications    Scheduled Meds: . aspirin EC  81 mg Oral Daily  . atorvastatin  80 mg Oral q1800  . calcitRIOL  0.5 mcg Oral Q M,W,F-HD  . calcitRIOL  0.5 mcg Oral Once  . clopidogrel  75 mg Oral Daily  . famotidine  20 mg Oral Daily  . gentamicin irrigation  80 mg Irrigation On Call  . insulin aspart  0-15 Units Subcutaneous TID WC  . lidocaine  1 patch Transdermal Q24H  . metoprolol tartrate  12.5 mg Oral BID  . multivitamin  1 tablet Oral QHS  . sevelamer carbonate  1,600 mg Oral TID WC  . sodium chloride flush  3 mL Intravenous Q12H   Continuous Infusions: . sodium chloride    . sodium chloride    . sodium chloride 10 mL/hr at 06/29/17 0652  . ferric gluconate (FERRLECIT/NULECIT) IV    . heparin Stopped (06/28/17 1610)  . vancomycin     PRN Meds: sodium chloride, sodium chloride, acetaminophen, ketorolac, ondansetron (ZOFRAN) IV, sodium chloride flush   Vital Signs    Vitals:   06/28/17 2342 06/29/17 0511 06/29/17 0512 06/29/17 0515  BP: (!) 149/90 (!) 145/85    Pulse: 79  77   Resp: 14  16   Temp:   (!) 97.5 F (36.4 C)   TempSrc:   Oral   SpO2: 100%  100%   Weight:    215 lb 8 oz (97.8 kg)  Height:        Intake/Output Summary (Last 24 hours) at 06/29/2017 0718 Last data filed at 06/29/2017 0023 Gross per 24 hour  Intake 200 ml  Output 2600 ml  Net -2400 ml   Filed Weights   06/28/17 1830 06/28/17 2308 06/29/17 0515  Weight: 221 lb 5.5 oz (100.4 kg) 216 lb 14.9 oz (98.4 kg) 215 lb 8 oz (97.8 kg)    Physical Exam    GEN- The patient is well appearing, alert and oriented x 3 today.   Head- normocephalic, atraumatic Eyes-  Sclera clear, conjunctiva pink Ears- hearing  intact Oropharynx- clear Neck- supple Lungs- Clear to ausculation bilaterally, normal work of breathing Heart- Regular rate and rhythm  GI- soft, NT, ND, + BS Extremities- no clubbing, cyanosis, or edema Skin- no rash or lesion Psych- euthymic mood, full affect Neuro- strength and sensation are intact  Labs    CBC Recent Labs    06/28/17 0333 06/29/17 0403  WBC 6.4 6.1  HGB 9.5* 10.1*  HCT 30.6* 32.1*  MCV 93.0 93.0  PLT 317 301   Basic Metabolic Panel Recent Labs    96/04/54 0333 06/29/17 0403  NA 138 138  K 4.1 3.9  CL 103 99*  CO2 25 30  GLUCOSE 112* 111*  BUN 36* 19  CREATININE 8.91* 5.91*  CALCIUM 8.3* 8.3*  MG 2.0 2.0    Telemetry    SR (personally reviewed)  Radiology    No results found.   Assessment & Plan    1.  Cardiac arrest Cath without targets for revascularization Plan for S-ICD this morning Risks, benefits reviewed, he is willing to proceed. I tried to call his daughter this morning but did not get an answer. He talked with her last night about  ICD implant  2.  CAD No targets for revascularization per cath yesterday Per general cardiology  3.  ESRD on HD Per nephrology   Signed, Gypsy Balsam, NP  06/29/2017, 7:18 AM   EP attending  Patient seen and examined.  Agree with findings as noted above.  He has had no recurrent ventricular arrhythmias since his resuscitated ventricular fibrillation cardiac arrest.  He will undergo insertion of a subcutaneous ICD today.  The risks, goals, benefits, and expectations of the procedure were discussed in detail and he wishes to proceed.  Sharrell Ku, MD

## 2017-06-30 ENCOUNTER — Inpatient Hospital Stay (HOSPITAL_COMMUNITY): Payer: Medicaid Other

## 2017-06-30 LAB — BASIC METABOLIC PANEL
Anion gap: 11 (ref 5–15)
BUN: 14 mg/dL (ref 6–20)
CALCIUM: 8.8 mg/dL — AB (ref 8.9–10.3)
CO2: 27 mmol/L (ref 22–32)
Chloride: 98 mmol/L — ABNORMAL LOW (ref 101–111)
Creatinine, Ser: 5.13 mg/dL — ABNORMAL HIGH (ref 0.61–1.24)
GFR calc non Af Amer: 11 mL/min — ABNORMAL LOW (ref >60)
GFR, EST AFRICAN AMERICAN: 13 mL/min — AB (ref >60)
Glucose, Bld: 112 mg/dL — ABNORMAL HIGH (ref 65–99)
Potassium: 4.5 mmol/L (ref 3.5–5.1)
Sodium: 136 mmol/L (ref 135–145)

## 2017-06-30 LAB — CBC
HEMATOCRIT: 32.6 % — AB (ref 39.0–52.0)
Hemoglobin: 9.9 g/dL — ABNORMAL LOW (ref 13.0–17.0)
MCH: 28.4 pg (ref 26.0–34.0)
MCHC: 30.4 g/dL (ref 30.0–36.0)
MCV: 93.4 fL (ref 78.0–100.0)
Platelets: 328 10*3/uL (ref 150–400)
RBC: 3.49 MIL/uL — ABNORMAL LOW (ref 4.22–5.81)
RDW: 15.9 % — AB (ref 11.5–15.5)
WBC: 8.5 10*3/uL (ref 4.0–10.5)

## 2017-06-30 LAB — GLUCOSE, CAPILLARY
GLUCOSE-CAPILLARY: 121 mg/dL — AB (ref 65–99)
Glucose-Capillary: 98 mg/dL (ref 65–99)
Glucose-Capillary: 138 mg/dL — ABNORMAL HIGH (ref 65–99)
Glucose-Capillary: 115 mg/dL — ABNORMAL HIGH (ref 65–99)

## 2017-06-30 LAB — CULTURE, BLOOD (ROUTINE X 2): Culture: NO GROWTH

## 2017-06-30 LAB — MAGNESIUM: Magnesium: 2 mg/dL (ref 1.7–2.4)

## 2017-06-30 MED ORDER — OXYCODONE HCL 5 MG PO TABS
5.0000 mg | ORAL_TABLET | ORAL | Status: DC | PRN
  Administered 2017-06-30 – 2017-07-03 (×10): 5 mg via ORAL
  Filled 2017-06-30 (×10): qty 1

## 2017-06-30 NOTE — Progress Notes (Signed)
PT Cancellation Note  Patient Details Name: Richard Barnett MRN: [id number] DOB: 1955/08/10   Cancelled Treatment:    Reason Eval/Treat Not Completed: Medical issues which prohibited therapy.   Pt just had a rapid response and will ck later to see how things are progressing.   Ivar Drape 06/30/2017, 8:41 AM   Samul Dada, PT MS Acute Rehab Dept. Number: [facility] [id number] and [facility] [phone]

## 2017-06-30 NOTE — Progress Notes (Addendum)
Assessment/Plan: 1.Cardiac arrest - s/p AICD 2. ESRD -HD Friday. 5. HTN/volume -BP ok.  7. Depression - Admit in March for SI. On Zoloft.  . Subjective: Interval History: post op soreness  Objective: Vital signs in last 24 hours: Temp:  [97.3 F (36.3 C)-99.4 F (37.4 C)] 97.8 F (36.6 C) (04/25 0349) Pulse Rate:  [66-93] 92 (04/25 0945) Resp:  [10-18] 12 (04/25 0349) BP: (89-159)/(45-92) 119/79 (04/25 0401) SpO2:  [98 %-100 %] 100 % (04/25 0349) Weight:  [96.4 kg (212 lb 8 oz)-101.8 kg (224 lb 6.9 oz)] 96.4 kg (212 lb 8 oz) (04/25 0355) Weight change: 1.4 kg (3 lb 1.4 oz)  Intake/Output from previous day: 04/24 0701 - 04/25 0700 In: 860 [P.O.:660; I.V.:200] Out: 1725 [Blood:25] Intake/Output this shift: No intake/output data recorded.  General appearance: alert and cooperative Chest wall: tender and post PM Cardio: regular rate and rhythm, S1, S2 normal, no murmur, click, rub or gallop Extremities: extremities normal, atraumatic, no cyanosis , 1+ edema  Lab Results: Recent Labs    06/29/17 0403 06/30/17 0340  WBC 6.1 8.5  HGB 10.1* 9.9*  HCT 32.1* 32.6*  PLT 301 328   BMET:  Recent Labs    06/29/17 0403 06/30/17 0340  NA 138 136  K 3.9 4.5  CL 99* 98*  CO2 30 27  GLUCOSE 111* 112*  BUN 19 14  CREATININE 5.91* 5.13*  CALCIUM 8.3* 8.8*   No results for input(s): PTH in the last 72 hours. Iron Studies: No results for input(s): IRON, TIBC, TRANSFERRIN, FERRITIN in the last 72 hours. Studies/Results: Dg Chest Port 1 View  Result Date: 06/30/2017 CLINICAL DATA:  Worsening chest pain after ICD. EXAM: PORTABLE CHEST 1 VIEW COMPARISON:  Chest radiograph June 24, 2017 FINDINGS: Cardiac silhouette is mildly enlarged, improved in appearance from prior examination. Mediastinal silhouette is nonsuspicious. No pleural effusion or focal consolidation. LEFT cardiac defibrillator with lead tip projecting midline. LEFT chest wall subcutaneous emphysema consistent  with recent placement of ICD. IMPRESSION: Mild cardiomegaly.  No acute pulmonary process. Electronically Signed   By: Awilda Metro M.D.   On: 06/30/2017 05:22    Scheduled: . aspirin EC  81 mg Oral Daily  . atorvastatin  80 mg Oral q1800  . calcitRIOL  0.5 mcg Oral Q M,W,F-HD  . calcitRIOL  0.5 mcg Oral Once  . clopidogrel  75 mg Oral Daily  . famotidine  20 mg Oral Daily  . heparin  5,000 Units Subcutaneous Q8H  . insulin aspart  0-15 Units Subcutaneous TID WC  . lidocaine  1 patch Transdermal Q24H  . metoprolol tartrate  12.5 mg Oral BID  . multivitamin  1 tablet Oral QHS  . sevelamer carbonate  1,600 mg Oral TID WC  . sodium chloride flush  3 mL Intravenous Q12H     LOS: 6 days   Lauris Poag 06/30/2017,10:13 AM

## 2017-06-30 NOTE — Evaluation (Signed)
Occupational Therapy Evaluation Patient Details Name: Richard Barnett TITH MRN: [id number] DOB: 12-14-1955 Today's Date: 06/30/2017    History of Present Illness Briefly, this is a 61yoM with hx ESRD on Hd, CAD, HTN, GERD, DVT, Depression, who had cardiac arrest at OP HD with CPR and defibrillation. S/P cath on 4/23 and had a defibrillator placed 4/24.   Clinical Impression   This 62 yo A[place]ian admitted and underwent above presents to acute OT with decreased balance, decreased endurance, increased pain, anddefibrillator precautions thus affecting his PLOF of living independently and getting around by walking or taking public transportation. He will benefit from acute OT with follow up OT at SNF to get back to PLOF.     Follow Up Recommendations  SNF;Supervision/Assistance - 24 hour    Equipment Recommendations  Other (comment)(Single point cane)       Precautions / Restrictions Precautions Precautions: Fall;ICD/Pacemaker Restrictions Weight Bearing Restrictions: Yes Other Position/Activity Restrictions: No pushing, pulling, lifting      Mobility Bed Mobility               General bed mobility comments: Pt sitting on EOB upon my arrival  Transfers Overall transfer level: Needs assistance Equipment used: Straight cane Transfers: Sit to/from Stand Sit to Stand: Min guard         General transfer comment: Pt ambulated 100 feet with SPC     Balance Overall balance assessment: Needs assistance Sitting-balance support: No upper extremity supported;Feet supported Sitting balance-Leahy Scale: Good     Standing balance support: Single extremity supported;During functional activity Standing balance-Leahy Scale: Poor Standing balance comment: reliant on RW                           ADL either performed or assessed with clinical judgement   ADL Overall ADL's : Needs assistance/impaired Eating/Feeding: Independent;Sitting   Grooming: Set up;Sitting   Upper Body  Bathing: Set up;Sitting   Lower Body Bathing: Moderate assistance Lower Body Bathing Details (indicate cue type and reason): min guard A sit<>stand Upper Body Dressing : Minimal assistance;Sitting Upper Body Dressing Details (indicate cue type and reason): educated on putting LLE in button up shirt first Lower Body Dressing: Maximal assistance Lower Body Dressing Details (indicate cue type and reason): min guard A sit<>stand Toilet Transfer: Minimal assistance;Ambulation;Regular Social worker and Hygiene: Min guard;Sit to/from stand               Vision Patient Visual Report: No change from baseline              Pertinent Vitals/Pain Pain Assessment: Faces Faces Pain Scale: Hurts little more Pain Location: chest area when he moves certain ways (pt s/p CPR) Pain Descriptors / Indicators: Sore Pain Intervention(s): Limited activity within patient's tolerance     Hand Dominance Right   Extremity/Trunk Assessment Upper Extremity Assessment Upper Extremity Assessment: LUE deficits/detail LUE Deficits / Details: Ok to use LUE functionally and can; is not to use it for pushing, pulling, lifting           Communication Communication Communication: No difficulties   Cognition Arousal/Alertness: Awake/alert Behavior During Therapy: WFL for tasks assessed/performed Overall Cognitive Status: Impaired/Different from baseline Area of Impairment: Orientation;Memory;Following commands                 Orientation Level: Situation("something happend to me at dialysis", when I told him he had had a heart attack, had CPR, and defibrillated he was  very surprised)   Memory: Decreased short-term memory;Decreased recall of precautions(pt asking about is left bandage being changed; spoke with RN and she reports she has told him multiple times that it is fine and she does not need to change it) Following Commands: Follows one step commands consistently                       Home Living Family/patient expects to be discharged to:: Skilled nursing facility Living Arrangements: Alone   Type of Home: Apartment Home Access: Elevator     Home Layout: One level     Bathroom Shower/Tub: IT trainer: Standard     Home Equipment: None          Prior Functioning/Environment Level of Independence: Independent        Comments: takes bus or walks wherever he needs to go        OT Problem List: Decreased activity tolerance;Impaired balance (sitting and/or standing);Pain      OT Treatment/Interventions: Self-care/ADL training;Balance training;DME and/or AE instruction;Patient/family education    OT Goals(Current goals can be found in the care plan section) Acute Rehab OT Goals Patient Stated Goal: to go to rehab then home OT Goal Formulation: With patient Time For Goal Achievement: 07/14/17 Potential to Achieve Goals: Good  OT Frequency: Min 2X/week   Barriers to D/C: Decreased caregiver support             AM-PAC PT "6 Clicks" Daily Activity     Outcome Measure Help from another person eating meals?: None Help from another person taking care of personal grooming?: A Little Help from another person toileting, which includes using toliet, bedpan, or urinal?: A Little Help from another person bathing (including washing, rinsing, drying)?: A Lot Help from another person to put on and taking off regular upper body clothing?: A Little Help from another person to put on and taking off regular lower body clothing?: A Lot 6 Click Score: 17   End of Session Equipment Utilized During Treatment: Gait belt(SPC) Nurse Communication: Mobility status(need for SNF)  Activity Tolerance: Patient limited by fatigue Patient left: (sitting EOB)  OT Visit Diagnosis: Unsteadiness on feet (R26.81);Muscle weakness (generalized) (M62.81);Pain Pain - part of body: (chest)                Time:  8413-2440 OT Time Calculation (min): 20 min Charges:  OT General Charges $OT Visit: 1 Visit OT Evaluation $OT Eval Moderate Complexity: [address] [place] [phone] 06/30/2017

## 2017-06-30 NOTE — Progress Notes (Signed)
Upon RN assessment of left lateral chest dressing, new sanguinous drainage noted on gauze that has exceeded previously marked area.  Left back area swelling noted, concerning for hematoma.  RN text paged Cardiology.

## 2017-06-30 NOTE — Significant Event (Addendum)
Rapid Response Event Note  Received a call from RN about increased drainage and left side swelling around new placed sub-q ICD. Per RN, VSS, patient is s/p cardiac arrest and has had chest soreness since then, patient does endorse pain from ICD placement per RN. Per RN, VSS. I asked to the RN to page the MD on call and if she did not get a call back in 10 minutes to inform me. I did speak the RN after 10 minutes and I placed a STAT PCXR while awaiting MD call.   Since VSS are stable, currently no RRT interventions.  Call Time [phone]

## 2017-06-30 NOTE — Progress Notes (Addendum)
Electrophysiology Rounding Note  Patient Name: Richard Barnett Date of Encounter: 06/30/2017  Primary Cardiologist: [facility] Electrophysiologist: Ladona Ridgel   Subjective   The patient is doing well today.  +pain at S-ICD implant site  Inpatient Medications    Scheduled Meds: . aspirin EC  81 mg Oral Daily  . atorvastatin  80 mg Oral q1800  . calcitRIOL  0.5 mcg Oral Q M,W,F-HD  . calcitRIOL  0.5 mcg Oral Once  . clopidogrel  75 mg Oral Daily  . famotidine  20 mg Oral Daily  . heparin  5,000 Units Subcutaneous Q8H  . insulin aspart  0-15 Units Subcutaneous TID WC  . lidocaine  1 patch Transdermal Q24H  . metoprolol tartrate  12.5 mg Oral BID  . multivitamin  1 tablet Oral QHS  . sevelamer carbonate  1,600 mg Oral TID WC  . sodium chloride flush  3 mL Intravenous Q12H   Continuous Infusions: . sodium chloride    . sodium chloride    . sodium chloride    . sodium chloride    . ferric gluconate (FERRLECIT/NULECIT) IV Stopped (06/29/17 1900)   PRN Meds: sodium chloride, sodium chloride, sodium chloride, sodium chloride, acetaminophen, heparin, heparin, lidocaine (PF), lidocaine-prilocaine, ondansetron (ZOFRAN) IV, pentafluoroprop-tetrafluoroeth, sodium chloride flush   Vital Signs    Vitals:   06/29/17 2137 06/30/17 0349 06/30/17 0355 06/30/17 0401  BP: 127/83 97/67  119/79  Pulse: 91 80  76  Resp:  12    Temp: 99.4 F (37.4 C) 97.8 F (36.6 C)    TempSrc: Oral Oral    SpO2: 99% 100%    Weight:   212 lb 8 oz (96.4 kg)   Height:        Intake/Output Summary (Last 24 hours) at 06/30/2017 0818 Last data filed at 06/30/2017 1779 Gross per 24 hour  Intake 860 ml  Output 1725 ml  Net -865 ml   Filed Weights   06/29/17 0515 06/29/17 1410 06/30/17 0355  Weight: 215 lb 8 oz (97.8 kg) 224 lb 6.9 oz (101.8 kg) 212 lb 8 oz (96.4 kg)    Physical Exam    GEN- The patient is well appearing, alert and oriented x 3 today.   Head- normocephalic, atraumatic Eyes-   Sclera clear, conjunctiva pink Ears- hearing intact Oropharynx- clear Neck- supple Lungs- Clear to ausculation bilaterally, normal work of breathing Heart- Regular rate and rhythm  GI- soft, NT, ND, + BS Extremities- no clubbing, cyanosis, or edema Skin- no rash or lesion Psych- euthymic mood, full affect Neuro- strength and sensation are intact  Labs    CBC Recent Labs    06/29/17 0403 06/30/17 0340  WBC 6.1 8.5  HGB 10.1* 9.9*  HCT 32.1* 32.6*  MCV 93.0 93.4  PLT 301 328   Basic Metabolic Panel Recent Labs    39/03/00 0403 06/30/17 0340  NA 138 136  K 3.9 4.5  CL 99* 98*  CO2 30 27  GLUCOSE 111* 112*  BUN 19 14  CREATININE 5.91* 5.13*  CALCIUM 8.3* 8.8*  MG 2.0 2.0     Telemetry    SR (personally reviewed)  Radiology    Dg Chest Port 1 View  Result Date: 06/30/2017 CLINICAL DATA:  Worsening chest pain after ICD. EXAM: PORTABLE CHEST 1 VIEW COMPARISON:  Chest radiograph June 24, 2017 FINDINGS: Cardiac silhouette is mildly enlarged, improved in appearance from prior examination. Mediastinal silhouette is nonsuspicious. No pleural effusion or focal consolidation. LEFT cardiac defibrillator with  lead tip projecting midline. LEFT chest wall subcutaneous emphysema consistent with recent placement of ICD. IMPRESSION: Mild cardiomegaly.  No acute pulmonary process. Electronically Signed   By: Awilda Metro M.D.   On: 06/30/2017 05:22     Assessment & Plan    1.  Cardiac arrest S/p S-ICD Significant pain post procedure - will need Oxycodone for several days to control  No driving x 6 months  2.  CAD No targets for revascularization on cath this admission Continue medical therapy  3.  ESRD Per nephrology  Ok from EP standpoint to discharge home.  Routine wound care and follow up (arranged and entered in AVS) Electrophysiology team to see as needed while here. Please call with questions.  Signed, Gypsy Balsam, NP  06/30/2017, 8:18 AM   EP  attending  Patient seen and examined.  Agree with the findings as noted above.  He is status post subcutaneous ICD insertion with stable device function.  His incisions look good with no hematoma or ecchymoses.  He has had increased bleeding risk with his end-stage renal disease.  He has dialysis scheduled for tomorrow.  I asked the patient to keep his incisions dry for a week.  He will be treated for his incisional pain with narcotics as needed.  Usual device follow-up.  Lewayne Bunting, MD

## 2017-06-30 NOTE — Progress Notes (Signed)
Dr. Cristina Gong, on call with Cardiology returned page and was updated about RN concern of hematoma and RN speaking with rapid response RN and rapid response RN placed order for chest xray.  Per Dr. Cristina Gong hold 0600 subcutaneous heparin injection.

## 2017-06-30 NOTE — NC FL2 (Addendum)
[place] MEDICAID FL2 LEVEL OF CARE SCREENING TOOL     IDENTIFICATION  Patient Name: Richard Barnett Birthdate: [birth year] Sex: Jaylyn Admission Date (Current Location): 06/24/2017  [facility] and [place] Number:  Producer, television/film/video and Address:  The . [facility], [address] [place], [place] [address]      Provider Number: [id number]  Attending Physician Name and Address:  Noralee Stain, DO  Relative Name and Phone Number:  Fenton Foy, daughter, [phone]    Current Level of Care: SNF Recommended Level of Care: Skilled Nursing Facility Prior Approval Number:    Date Approved/Denied:   PASRR Number: [id number]  Discharge Plan: SNF    Current Diagnoses: Patient Active Problem List   Diagnosis Date Noted  . Cardiac arrest (HCC) 06/24/2017  . Adjustment disorder with mixed disturbance of emotions and conduct   . Major depressive disorder, recurrent episode, severe (HCC) 05/14/2017  . GERD (gastroesophageal reflux disease) 05/13/2017  . ESRD on dialysis (HCC) 05/13/2017  . Suicidal ideation 05/13/2017  . NSTEMI (non-ST elevated myocardial infarction) (HCC) 01/27/2017  . Diabetes mellitus (HCC)   . Anemia of chronic renal failure   . Secondary hyperparathyroidism of renal origin (HCC)   . Proteinuria   . Arthritis   . Depression   . Deep vein thrombosis (HCC)   . Myocardial infarction (HCC)   . Heart failure, unspecified (HCC)   . Heart murmur   . Prediabetes 02/22/2015  . Chronic kidney disease, stage IV (severe) (HCC)   . Hyperlipidemia 05/22/2014  . Chest pain 05/21/2014  . Hypokalemia 05/21/2014  . CAD (coronary artery disease) 05/21/2014  . Hypertension 05/21/2014    Orientation RESPIRATION BLADDER Height & Weight     Self, Time, Situation, Place  Normal Continent Weight: 212 lb 8 oz (96.4 kg) Height:  5\' 7"  (170.2 cm)  BEHAVIORAL SYMPTOMS/MOOD NEUROLOGICAL BOWEL NUTRITION STATUS      Continent Diet(please see DC summary)   AMBULATORY STATUS COMMUNICATION OF NEEDS Skin     Verbally Surgical wounds(closed chest incisions - ICD placement)                       Personal Care Assistance Level of Assistance  Bathing, Feeding, Dressing           Functional Limitations Info  Sight, Hearing, Speech Sight Info: Adequate Hearing Info: Adequate Speech Info: Adequate    SPECIAL CARE FACTORS FREQUENCY  PT (By licensed PT), OT (By licensed OT)     PT Frequency: 5x/week OT Frequency: 5x/week            Contractures Contractures Info: Not present    Additional Factors Info  Code Status, Allergies, Insulin Sliding Scale Code Status Info: Full Allergies Info: Penicillins   Insulin Sliding Scale Info: novolog 3x/day with meals       Current Medications (06/30/2017):  This is the current hospital active medication list Current Facility-Administered Medications  Medication Dose Route Frequency Provider Last Rate Last Dose  . 0.9 %  sodium chloride infusion  250 mL Intravenous PRN Marinus Maw, MD      . 0.9 %  sodium chloride infusion  250 mL Intravenous PRN Marinus Maw, MD      . 0.9 %  sodium chloride infusion  100 mL Intravenous PRN Ejigiri, Megan Mans, PA-C      . 0.9 %  sodium chloride infusion  100 mL Intravenous PRN Tomasa Blase, PA-C      .  acetaminophen (TYLENOL) tablet 325-650 mg  325-650 mg Oral Q4H PRN Marinus Maw, MD   650 mg at 06/29/17 2026  . aspirin EC tablet 81 mg  81 mg Oral Daily Marinus Maw, MD   81 mg at 06/30/17 0946  . atorvastatin (LIPITOR) tablet 80 mg  80 mg Oral q1800 Marinus Maw, MD   80 mg at 06/29/17 1924  . calcitRIOL (ROCALTROL) capsule 0.5 mcg  0.5 mcg Oral Q M,W,F-HD Marinus Maw, MD   0.5 mcg at 06/29/17 1800  . calcitRIOL (ROCALTROL) capsule 0.5 mcg  0.5 mcg Oral Once Marinus Maw, MD      . clopidogrel (PLAVIX) tablet 75 mg  75 mg Oral Daily Marinus Maw, MD   75 mg at 06/30/17 0945  . famotidine (PEPCID) tablet 20 mg  20  mg Oral Daily Marinus Maw, MD   20 mg at 06/30/17 0946  . ferric gluconate (NULECIT) 125 mg in sodium chloride 0.9 % 100 mL IVPB  125 mg Intravenous Q M,W,F-HD Marinus Maw, MD   Stopped at 06/29/17 1900  . heparin injection 1,000 Units  1,000 Units Dialysis PRN Tomasa Blase, PA-C      . heparin injection 2,000 Units  20 Units/kg Dialysis PRN Tomasa Blase, PA-C      . heparin injection 5,000 Units  5,000 Units Subcutaneous Q8H Noralee Stain, DO   Stopped at 06/30/17 [id number]  . insulin aspart (novoLOG) injection 0-15 Units  0-15 Units Subcutaneous TID WC Marinus Maw, MD   2 Units at 06/30/17 1145  . lidocaine (LIDODERM) 5 % 1 patch  1 patch Transdermal Q24H Marinus Maw, MD   1 patch at 06/29/17 2032  . lidocaine (PF) (XYLOCAINE) 1 % injection 5 mL  5 mL Intradermal PRN Tomasa Blase, PA-C      . lidocaine-prilocaine (EMLA) cream 1 application  1 application Topical PRN Tomasa Blase, PA-C      . metoprolol tartrate (LOPRESSOR) tablet 12.5 mg  12.5 mg Oral BID Marinus Maw, MD   12.5 mg at 06/30/17 0946  . multivitamin (RENA-VIT) tablet 1 tablet  1 tablet Oral QHS Marinus Maw, MD   1 tablet at 06/29/17 2026  . ondansetron (ZOFRAN) injection 4 mg  4 mg Intravenous Q6H PRN Marinus Maw, MD      . oxyCODONE (Oxy IR/ROXICODONE) immediate release tablet 5 mg  5 mg Oral Q4H PRN Marily Lente, NP   5 mg at 06/30/17 1103  . pentafluoroprop-tetrafluoroeth (GEBAUERS) aerosol 1 application  1 application Topical PRN Tomasa Blase, PA-C      . sevelamer carbonate (RENVELA) tablet 1,600 mg  1,600 mg Oral TID WC Marinus Maw, MD   1,600 mg at 06/30/17 1208  . sodium chloride flush (NS) 0.9 % injection 3 mL  3 mL Intravenous Q12H Marinus Maw, MD   3 mL at 06/30/17 0947  . sodium chloride flush (NS) 0.9 % injection 3 mL  3 mL Intravenous PRN Marinus Maw, MD   3 mL at 06/29/17 0024     Discharge Medications: Please see discharge summary  for a list of discharge medications.  Relevant Imaging Results:  Relevant Lab Results:   Additional Information SSN: [id number]; ICD placement this admission  Abigail Butts, LCSW

## 2017-06-30 NOTE — Clinical Social Work Note (Signed)
Clinical Social Work Assessment  Patient Details  Name: Richard Barnett MRN: [id number] Date of Birth: Sep 05, 1955  Date of referral:  06/30/17               Reason for consult:  Discharge Planning                Permission sought to share information with:  Facility Sport and exercise psychologist, Family Supports Permission granted to share information::  Yes, Verbal Permission Granted  Name::     Marita Snellen  Agency::  SNFs  Relationship::  daughter  Contact Information:  [phone]  Housing/Transportation Living arrangements for the past 2 months:  Apartment Source of Information:  Patient Patient Interpreter Needed:  None Criminal Activity/Legal Involvement Pertinent to Current Situation/Hospitalization:  No - Comment as needed Significant Relationships:  Adult Children, Other Family Members Lives with:  Self Do you feel safe going back to the place where you live?  Yes Need for family participation in patient care:  No (Coment)  Care giving concerns: Patient is from home independently. ICD placed this admission. Patient is known to this CSW from last admission and subsequent discharge to [facility].   Social Worker assessment / plan: CSW met with patient at bedside. Patient alert and oriented. CSW discussed PT recommendations for SNF. Patient indicated he is agreeable to short term SNF. CSW made patient aware that because patient has Medicaid, he will be required to stay at SNF for at least one month his social security check will go to the facility during his stay. Patient voiced understanding and agreement to this. CSW to send out initial referrals and follow up with bed offers.  CSW unable to screen patient for PASRR today, as [employer] system has been down all day. Patient will need to have PASRR before admitting to facility. CSW to attempt to screen patient again tomorrow. CSW to follow and support with discharge planning.  Employment status:  Retired Forensic scientist:   Medicaid In [place] PT Recommendations:  [place] / Referral to community resources:  [place]  Patient/Family's Response to care: Patient appreciative of care.  Patient/Family's Understanding of and Emotional Response to Diagnosis, Current Treatment, and Prognosis: Patient has understanding of his condition and is agreeable to SNF.   Emotional Assessment Appearance:  Appears stated age Attitude/Demeanor/Rapport:  Engaged Affect (typically observed):  Accepting, Calm Orientation:  Oriented to Self, Oriented to Place, Oriented to  Time, Oriented to Situation Alcohol / Substance use:  Not Applicable Psych involvement (Current and /or in the community):  No (Comment)  Discharge Needs  Concerns to be addressed:  Care Coordination, Discharge Planning Concerns Readmission within the last 30 days:  Yes Current discharge risk:  Physical Impairment, Lives alone Barriers to Discharge:  Continued Medical Work up, Programmer, applications ([place])   Estanislado Emms, LCSW 06/30/2017, 3:51 PM

## 2017-06-30 NOTE — Evaluation (Signed)
Physical Therapy Evaluation Patient Details Name: Richard Barnett MRN: [id number] DOB: 1955-12-09 Today's Date: 06/30/2017   History of Present Illness  Briefly, this is a 61yoM with hx ESRD on Hd, CAD, HTN, GERD, DVT, Depression, who had cardiac arrest at OP HD with CPR and defibrillation. S/P cath on 4/23 and had a defibrillator placed 4/24.  Clinical Impression  Pt was seen for evaluation of mobility and noted limited awareness of his LUE restrictions and poor control of mobility without using it.  He is able to walk with no decrease in O2 sats and pulses were controlled but is quite tired and moving very slowly.  He is worrisomely unable to maneuver with any real purpose on the hall, relying on PT for all direction of setting activity limits, frequently forgetting instructions.  Will recommend SNF as he is unable to move with recall of precautions and will need help for all mobility, as well as to instruct limits of endurance for safety.    Follow Up Recommendations SNF    Equipment Recommendations  None recommended by PT    Recommendations for Other Services       Precautions / Restrictions Precautions Precautions: Fall;ICD/Pacemaker Precaution Comments: pt completely unaware of LUE precautions Restrictions Weight Bearing Restrictions: Yes LUE Weight Bearing: Non weight bearing Other Position/Activity Restrictions: No pushing, pulling, lifting      Mobility  Bed Mobility Overal bed mobility: Needs Assistance Bed Mobility: Supine to Sit     Supine to sit: Min guard     General bed mobility comments: used LUE to push and PT stopped him  Transfers Overall transfer level: Needs assistance Equipment used: Straight cane Transfers: Sit to/from Stand Sit to Stand: Min assist         General transfer comment: reminders not to use LUE to stand  Ambulation/Gait Ambulation/Gait assistance: Min guard Ambulation Distance (Feet): 120 Feet Assistive device: Straight  cane Gait Pattern/deviations: Step-to pattern;Step-through pattern;Decreased stride length;Wide base of support Gait velocity: reduced Gait velocity interpretation: <1.31 ft/sec, indicative of household ambulator General Gait Details: very slow pace, labored and tired from going down the hall  Stairs            Wheelchair Mobility    Modified Rankin (Stroke Patients Only)       Balance Overall balance assessment: Needs assistance Sitting-balance support: Feet supported Sitting balance-Leahy Scale: Good     Standing balance support: Single extremity supported Standing balance-Leahy Scale: Fair Standing balance comment: less than fair with dynamic balance skills                             Pertinent Vitals/Pain Pain Assessment: Faces Faces Pain Scale: Hurts even more Pain Location: trunk from CPR Pain Descriptors / Indicators: Sore Pain Intervention(s): Limited activity within patient's tolerance;Monitored during session;Repositioned    Home Living Family/patient expects to be discharged to:: Skilled nursing facility Living Arrangements: Alone   Type of Home: Apartment Home Access: Elevator     Home Layout: One level Home Equipment: None      Prior Function Level of Independence: Independent with assistive device(s)         Comments: takes bus or walks wherever he needs to go     Hand Dominance   Dominant Hand: Right    Extremity/Trunk Assessment   Upper Extremity Assessment Upper Extremity Assessment: LUE deficits/detail LUE Deficits / Details: no forceful push pull lift LUE: Unable to fully assess due to  pain LUE Coordination: decreased fine motor;decreased gross motor    Lower Extremity Assessment Lower Extremity Assessment: Overall WFL for tasks assessed    Cervical / Trunk Assessment Cervical / Trunk Assessment: Normal  Communication   Communication: No difficulties  Cognition Arousal/Alertness: Awake/alert Behavior  During Therapy: WFL for tasks assessed/performed Overall Cognitive Status: Impaired/Different from baseline Area of Impairment: Orientation;Attention;Memory;Following commands;Safety/judgement;Awareness;Problem solving                 Orientation Level: Situation Current Attention Level: Selective Memory: Decreased recall of precautions;Decreased short-term memory Following Commands: Follows one step commands with increased time Safety/Judgement: Decreased awareness of safety;Decreased awareness of deficits Awareness: Intellectual Problem Solving: Slow processing;Difficulty sequencing;Requires verbal cues General Comments: reminders about not using LUE as pt is consistently trying to do so      General Comments      Exercises     Assessment/Plan    PT Assessment    PT Problem List         PT Treatment Interventions      PT Goals (Current goals can be found in the Care Plan section)  Acute Rehab PT Goals Patient Stated Goal: get home PT Goal Formulation: With patient Time For Goal Achievement: 07/14/17 Potential to Achieve Goals: Good    Frequency Min 2X/week   Barriers to discharge        Co-evaluation               AM-PAC PT "6 Clicks" Daily Activity  Outcome Measure Difficulty turning over in bed (including adjusting bedclothes, sheets and blankets)?: Unable Difficulty moving from lying on back to sitting on the side of the bed? : Unable Difficulty sitting down on and standing up from a chair with arms (e.g., wheelchair, bedside commode, etc,.)?: Unable Help needed moving to and from a bed to chair (including a wheelchair)?: A Little Help needed walking in hospital room?: A Little Help needed climbing 3-5 steps with a railing? : A Little 6 Click Score: 12    End of Session Equipment Utilized During Treatment: Gait belt Activity Tolerance: Patient limited by fatigue;Treatment limited secondary to medical complications (Comment)(ck of O2 sats was  fine with readings 97% but pulses fluctuat) Patient left: in bed;with call bell/phone within reach;with nursing/sitter in room Nurse Communication: Mobility status PT Visit Diagnosis: Unsteadiness on feet (R26.81);Other abnormalities of gait and mobility (R26.89);Difficulty in walking, not elsewhere classified (R26.2);Pain;Adult, failure to thrive (R62.7) Pain - Right/Left: Left Pain - part of body: (chest)    Time: 6213-0865 PT Time Calculation (min) (ACUTE ONLY): 20 min   Charges:   PT Evaluation $PT Eval Moderate Complexity: 1 Mod     PT G Codes:   PT G-Codes **NOT FOR INPATIENT CLASS** Functional Assessment Tool Used: AM-PAC 6 Clicks Basic Mobility   Ivar Drape 06/30/2017, 4:04 PM   Samul Dada, PT MS Acute Rehab Dept. Number: [facility] [id number] and [facility] [phone]

## 2017-06-30 NOTE — Progress Notes (Signed)
Called for an update, per RN, Cards MD was notified, RN to hold sub-q heparin and chest xray was reviewed. VSS

## 2017-06-30 NOTE — Progress Notes (Signed)
Patient complaining of 8/10 anterior chest pain (surgical pain).  PRN Tylenol give at 2026 and Lidoderm patch applied at 2032.  RN text paged [facility] with this information.

## 2017-06-30 NOTE — Progress Notes (Signed)
No return call from Cardiology as of yet, RN text paged for the second time.  RN also spoke to rapid response RN.

## 2017-06-30 NOTE — Progress Notes (Signed)
PROGRESS NOTE    Richard Barnett  JXB:[id number] DOB: Jul 25, 1955 DOA: 06/24/2017 PCP: Patient, No Pcp Per     Brief Narrative:  Richard Barnett is a 62 year old [place] with PMH of CAD s/p stent 01/2017, ESRD on HD MWF (began 04/2017), depression, DM, GERD, HTN who presented to ED on 4/19 from dialysis center. EMS reports that patient received 3 out of 4 hours of dialysis then was noted to have loss of  consciousness. AED placed. Patient shocked 3 times. Received 10 minutes of CPR before return of ROSC. Upon arrival to ED patient is confused but awake with contusions to tongue. Initial EKG with sinus tachycardia. CT head negative. Patient was admitted to ICU service with cardiology and nephrology consultation. Transferred to [facility] 4/21. He underwent heart cath 4/23 and subsequently had ICD placed 4/24.   Assessment & Plan:   Principal Problem:   Cardiac arrest [facility]) Active Problems:   Chronic kidney disease, stage IV (severe) (HCC)   Myocardial infarction [facility])   NSTEMI (non-ST elevated myocardial infarction) (HCC)   ESRD on dialysis (HCC)   Suicidal ideation  Status post cardiac arrest -Cardiology following -Heart cath 4/23 showed diffusely diseased LAD with 40-50% narrowing -EP consulted, ICD placed 4/24. Follow up for device as scheduled by EP. No driving for 6 months   NSTEMI -Troponin peak of 5.9 but unclear if precipitating event for arrest vs demand ischemia resulting from arrest -S/p heart cath 4/23   Chronic diastolic dysfunction -Echo EF 13%, grade 1 dd   CAD status post stents -Continue aspirin, Plavix, Lipitor, lopressor   DM type 2 -SSI   ESRD on hemodialysis MWF -Nephrology following  -HD 4/26   Acute metabolic encephalopathy -Could be secondary to transient hypoperfusion after cardiac arrest -EEG with abnormal showing very mild generalized cerebral dysfunction with background slowing but no epileptiform features -Stable   Musculoskeletal CP -S/p CPR -CXR on  4/19 without acute abnormality  -Toradol and lidoderm patch    DVT prophylaxis: Heparin subq  Code Status: Full Family Communication: No family at bedside Disposition Plan: HD 4/26, Will need SNF on discharge, PT eval pending    Consultants:   PCCM admission  Nephrology  Cardiology  EP  Antimicrobials:  Anti-infectives (From admission, onward)   Start     Dose/Rate Route Frequency Ordered Stop   06/29/17 1945  vancomycin (VANCOCIN) IVPB 1000 mg/200 mL premix     1,000 mg 200 mL/hr over 60 Minutes Intravenous Every 12 hours 06/29/17 1249 06/29/17 1900   06/29/17 1726  vancomycin (VANCOCIN) 500-5 MG/100ML-% IVPB    Note to Pharmacy:  Herriott, Melisa   : cabinet override      06/29/17 1726 06/30/17 0529   06/29/17 0600  gentamicin (GARAMYCIN) 80 mg in sodium chloride 0.9 % 500 mL irrigation     80 mg Irrigation On call 06/28/17 2330 06/29/17 0955   06/29/17 0600  vancomycin (VANCOCIN) IVPB 1000 mg/200 mL premix     1,000 mg 200 mL/hr over 60 Minutes Intravenous On call 06/28/17 2330 06/29/17 1010       Subjective: Doing well overall. Has chest pain related to ICD placement.   Objective: Vitals:   06/30/17 0349 06/30/17 0355 06/30/17 0401 06/30/17 0945  BP: 97/67  119/79   Pulse: 80  76 92  Resp: 12     Temp: 97.8 F (36.6 C)     TempSrc: Oral     SpO2: 100%     Weight:  96.4 kg (  212 lb 8 oz)    Height:        Intake/Output Summary (Last 24 hours) at 06/30/2017 1450 Last data filed at 06/30/2017 0337 Gross per 24 hour  Intake 420 ml  Output 1700 ml  Net -1280 ml   Filed Weights   06/29/17 0515 06/29/17 1410 06/30/17 0355  Weight: 97.8 kg (215 lb 8 oz) 101.8 kg (224 lb 6.9 oz) 96.4 kg (212 lb 8 oz)    Examination: General exam: Appears calm and comfortable  Respiratory system: Clear to auscultation. Respiratory effort normal. Cardiovascular system: S1 & S2 heard, RRR. No JVD, murmurs, rubs, gallops or clicks. No pedal edema. Dressing clean and dry    Gastrointestinal system: Abdomen is nondistended, soft and nontender. No organomegaly or masses felt. Normal bowel sounds heard. Central nervous system: Alert and oriented. No focal neurological deficits. Extremities: Symmetric 5 x 5 power. Skin: No rashes, lesions or ulcers Psychiatry: Judgement and insight appear stable    Data Reviewed: I have personally reviewed following labs and imaging studies  CBC: Recent Labs  Lab 06/26/17 0048 06/27/17 0507 06/28/17 0333 06/29/17 0403 06/30/17 0340  WBC 9.2 7.9 6.4 6.1 8.5  HGB 9.7* 10.3* 9.5* 10.1* 9.9*  HCT 30.7* 33.1* 30.6* 32.1* 32.6*  MCV 93.0 93.8 93.0 93.0 93.4  PLT 346 361 317 301 328   Basic Metabolic Panel: Recent Labs  Lab 06/25/17 0550  06/26/17 0048 06/27/17 0507 06/28/17 0333 06/29/17 0403 06/30/17 0340  NA 135   < > 135 137 138 138 136  K 4.0   < > 3.4* 4.2 4.1 3.9 4.5  CL 95*   < > 95* 100* 103 99* 98*  CO2 24   < > 26 26 25 30 27   GLUCOSE 130*   < > 151* 99 112* 111* 112*  BUN 16   < > 24* 29* 36* 19 14  CREATININE 6.28*   < > 7.80* 8.26* 8.91* 5.91* 5.13*  CALCIUM 8.5*   < > 8.3* 8.8* 8.3* 8.3* 8.8*  MG 1.9  --  1.9 2.0 2.0 2.0 2.0  PHOS 2.3*  --  4.2  --   --   --   --    < > = values in this interval not displayed.   GFR: Estimated Creatinine Clearance: 16.5 mL/min (A) (by C-G formula based on SCr of 5.13 mg/dL (H)). Liver Function Tests: Recent Labs  Lab 06/24/17 1730  AST 92*  ALT 65*  ALKPHOS 80  BILITOT 0.9  PROT 7.6  ALBUMIN 3.8   No results for input(s): LIPASE, AMYLASE in the last 168 hours. No results for input(s): AMMONIA in the last 168 hours. Coagulation Profile: Recent Labs  Lab 06/24/17 1730  INR 1.02   Cardiac Enzymes: Recent Labs  Lab 06/24/17 2008 06/25/17 0550 06/25/17 0940 06/26/17 0048  TROPONINI 0.64* 5.90* 5.51* 3.17*   BNP (last 3 results) No results for input(s): PROBNP in the last 8760 hours. HbA1C: No results for input(s): HGBA1C in the last 72  hours. CBG: Recent Labs  Lab 06/29/17 1123 06/29/17 1311 06/29/17 2135 06/30/17 0746 06/30/17 1112  GLUCAP 82 85 169* 98 138*   Lipid Profile: No results for input(s): CHOL, HDL, LDLCALC, TRIG, CHOLHDL, LDLDIRECT in the last 72 hours. Thyroid Function Tests: No results for input(s): TSH, T4TOTAL, FREET4, T3FREE, THYROIDAB in the last 72 hours. Anemia Panel: No results for input(s): VITAMINB12, FOLATE, FERRITIN, TIBC, IRON, RETICCTPCT in the last 72 hours. Sepsis Labs: Recent Labs  Lab  06/24/17 2014 06/24/17 2135 06/24/17 2324 06/25/17 0618 06/26/17 0048 06/27/17 0507  PROCALCITON  --  0.21  --   --  0.97 0.71  LATICACIDVEN 6.10*  --  5.2* 2.6* 2.3*  --     Recent Results (from the past 240 hour(s))  Culture, blood (routine x 2)     Status: None   Collection Time: 06/24/17  9:57 PM  Result Value Ref Range Status   Specimen Description BLOOD RIGHT ARM  Final   Special Requests   Final    BOTTLES DRAWN AEROBIC AND ANAEROBIC Blood Culture results may not be optimal due to an inadequate volume of blood received in culture bottles   Culture   Final    NO GROWTH 5 DAYS Performed at [facility], [name], [place], [place] [address]    Report Status 06/29/2017 FINAL  Final  MRSA PCR Screening     Status: None   Collection Time: 06/24/17 10:33 PM  Result Value Ref Range Status   MRSA by PCR NEGATIVE NEGATIVE Final    Comment:        The GeneXpert MRSA Assay (FDA approved for NASAL specimens only), is one component of a comprehensive MRSA colonization surveillance program. It is not intended to diagnose MRSA infection nor to guide or monitor treatment for MRSA infections. Performed at [facility], [address] [place], [place] [address]   Culture, blood (routine x 2)     Status: None   Collection Time: 06/24/17 10:55 PM  Result Value Ref Range Status   Specimen Description BLOOD RIGHT FOOT  Final   Special Requests   Final    BOTTLES DRAWN  AEROBIC ONLY Blood Culture results may not be optimal due to an inadequate volume of blood received in culture bottles   Culture   Final    NO GROWTH 5 DAYS Performed at [facility], [address], [place], [place] [address]    Report Status 06/30/2017 FINAL  Final  Surgical PCR screen     Status: None   Collection Time: 06/29/17 12:14 AM  Result Value Ref Range Status   MRSA, PCR NEGATIVE NEGATIVE Final   Staphylococcus aureus NEGATIVE NEGATIVE Final    Comment: (NOTE) The Xpert SA Assay (FDA approved for NASAL specimens in patients 70 years of age and older), is one component of a comprehensive surveillance program. It is not intended to diagnose infection nor to guide or monitor treatment. Performed at [facility], [address] [place], [place] [address]        Radiology Studies: Dg Chest Port 1 View  Result Date: 06/30/2017 CLINICAL DATA:  Worsening chest pain after ICD. EXAM: PORTABLE CHEST 1 VIEW COMPARISON:  Chest radiograph June 24, 2017 FINDINGS: Cardiac silhouette is mildly enlarged, improved in appearance from prior examination. Mediastinal silhouette is nonsuspicious. No pleural effusion or focal consolidation. LEFT cardiac defibrillator with lead tip projecting midline. LEFT chest wall subcutaneous emphysema consistent with recent placement of ICD. IMPRESSION: Mild cardiomegaly.  No acute pulmonary process. Electronically Signed   By: Awilda Metro M.D.   On: 06/30/2017 05:22      Scheduled Meds: . aspirin EC  81 mg Oral Daily  . atorvastatin  80 mg Oral q1800  . calcitRIOL  0.5 mcg Oral Q M,W,F-HD  . calcitRIOL  0.5 mcg Oral Once  . clopidogrel  75 mg Oral Daily  . famotidine  20 mg Oral Daily  . heparin  5,000 Units Subcutaneous Q8H  . insulin aspart  0-15 Units Subcutaneous TID WC  . lidocaine  1 patch Transdermal Q24H  . metoprolol tartrate  12.5 mg Oral BID  . multivitamin  1 tablet Oral QHS  . sevelamer carbonate  1,600 mg Oral TID  WC  . sodium chloride flush  3 mL Intravenous Q12H   Continuous Infusions: . sodium chloride    . sodium chloride    . sodium chloride    . sodium chloride    . ferric gluconate (FERRLECIT/NULECIT) IV Stopped (06/29/17 1900)     LOS: 6 days    Time spent: 20 minutes   Noralee Stain, DO [facility] [email or web address] Password [email or web address] 06/30/2017, 2:50 PM

## 2017-07-01 LAB — BASIC METABOLIC PANEL
Anion gap: 13 (ref 5–15)
BUN: 25 mg/dL — ABNORMAL HIGH (ref 6–20)
CALCIUM: 8.6 mg/dL — AB (ref 8.9–10.3)
CO2: 26 mmol/L (ref 22–32)
CREATININE: 7.77 mg/dL — AB (ref 0.61–1.24)
Chloride: 93 mmol/L — ABNORMAL LOW (ref 101–111)
GFR calc non Af Amer: 7 mL/min — ABNORMAL LOW (ref >60)
GFR, EST AFRICAN AMERICAN: 8 mL/min — AB (ref >60)
GLUCOSE: 108 mg/dL — AB (ref 65–99)
Potassium: 4.2 mmol/L (ref 3.5–5.1)
Sodium: 132 mmol/L — ABNORMAL LOW (ref 135–145)

## 2017-07-01 LAB — GLUCOSE, CAPILLARY
GLUCOSE-CAPILLARY: 95 mg/dL (ref 65–99)
GLUCOSE-CAPILLARY: 113 mg/dL — AB (ref 65–99)
Glucose-Capillary: 136 mg/dL — ABNORMAL HIGH (ref 65–99)

## 2017-07-01 LAB — MAGNESIUM: Magnesium: 2.2 mg/dL (ref 1.7–2.4)

## 2017-07-01 LAB — CBC
HCT: 29.9 % — ABNORMAL LOW (ref 39.0–52.0)
Hemoglobin: 9.3 g/dL — ABNORMAL LOW (ref 13.0–17.0)
MCH: 28.7 pg (ref 26.0–34.0)
MCHC: 31.1 g/dL (ref 30.0–36.0)
MCV: 92.3 fL (ref 78.0–100.0)
Platelets: 294 10*3/uL (ref 150–400)
RBC: 3.24 MIL/uL — ABNORMAL LOW (ref 4.22–5.81)
RDW: 15.5 % (ref 11.5–15.5)
WBC: 7.4 10*3/uL (ref 4.0–10.5)

## 2017-07-01 MED ORDER — CALCITRIOL 0.5 MCG PO CAPS
ORAL_CAPSULE | ORAL | Status: AC
  Administered 2017-07-01: 0.5 ug via ORAL
  Filled 2017-07-01: qty 1

## 2017-07-01 MED ORDER — HEPARIN SODIUM (PORCINE) 1000 UNIT/ML DIALYSIS: 20.0000 [IU]/kg | INTRAMUSCULAR | Status: DC | PRN

## 2017-07-01 MED ORDER — HEPARIN SODIUM (PORCINE) 1000 UNIT/ML DIALYSIS: 1000.0000 [IU] | INTRAMUSCULAR | Status: DC | PRN

## 2017-07-01 MED ORDER — ALTEPLASE 2 MG IJ SOLR: 2.0000 mg | Freq: Once | INTRAMUSCULAR | Status: DC | PRN

## 2017-07-01 MED ORDER — PENTAFLUOROPROP-TETRAFLUOROETH EX AERO: 1.0000 "application " | INHALATION_SPRAY | CUTANEOUS | Status: DC | PRN

## 2017-07-01 MED ORDER — LIDOCAINE HCL (PF) 1 % IJ SOLN: 5.0000 mL | INTRAMUSCULAR | Status: DC | PRN

## 2017-07-01 MED ORDER — SODIUM CHLORIDE 0.9 % IV SOLN: 100.0000 mL | INTRAVENOUS | Status: DC | PRN

## 2017-07-01 MED ORDER — LIDOCAINE-PRILOCAINE 2.5-2.5 % EX CREA
1.0000 "application " | TOPICAL_CREAM | CUTANEOUS | Status: DC | PRN
  Filled 2017-07-01: qty 5

## 2017-07-01 NOTE — Progress Notes (Signed)
CSW screening patient for PASRR. Patient's PASRR under manual review due to mental health history. MD will need to sign 30 day note and CSW to submit to [facility]. Patient needs PASRR before admitting to facility.  Abigail Butts, LCSWA [phone]

## 2017-07-01 NOTE — Progress Notes (Signed)
CSW continues to await patient's PASRR, which is under manual review. Patient's only SNF bed offer is at [facility]. Patient is agreeable to this SNF. CSW will support with discharge to SNF when PASRR received.  Abigail Butts, LCSWA [phone]

## 2017-07-01 NOTE — Progress Notes (Signed)
PROGRESS NOTE    Richard Barnett  ZOX:[id number] DOB: January 05, 1956 DOA: 06/24/2017 PCP: Patient, No Pcp Per     Brief Narrative:  Richard Barnett is a 62 year old [place] with PMH of CAD s/p stent 01/2017, ESRD on HD MWF (began 04/2017), depression, DM, GERD, HTN who presented to ED on 4/19 from dialysis center. EMS reports that patient received 3 out of 4 hours of dialysis then was noted to have loss of  consciousness. AED placed. Patient shocked 3 times. Received 10 minutes of CPR before return of ROSC. Upon arrival to ED patient is confused but awake with contusions to tongue. Initial EKG with sinus tachycardia. CT head negative. Patient was admitted to ICU service with cardiology and nephrology consultation. Transferred to [facility] 4/21. He underwent heart cath 4/23 and subsequently had ICD placed 4/24.   Assessment & Plan:   Principal Problem:   Cardiac arrest [facility]) Active Problems:   Chronic kidney disease, stage IV (severe) (HCC)   Myocardial infarction [facility])   NSTEMI (non-ST elevated myocardial infarction) (HCC)   ESRD on dialysis (HCC)   Suicidal ideation  Status post cardiac arrest -Cardiology following -Heart cath 4/23 showed diffusely diseased LAD with 40-50% narrowing -EP consulted, ICD placed 4/24. Follow up for device as scheduled by EP. No driving for 6 months   NSTEMI -Troponin peak of 5.9 but unclear if precipitating event for arrest vs demand ischemia resulting from arrest -S/p heart cath 4/23   Chronic diastolic dysfunction -Echo EF 81%, grade 1 dd   CAD status post stents -Continue aspirin, Plavix, Lipitor, lopressor   DM type 2 -SSI   ESRD on hemodialysis MWF -Nephrology following   Acute metabolic encephalopathy -Could be secondary to transient hypoperfusion after cardiac arrest -EEG with abnormal showing very mild generalized cerebral dysfunction with background slowing but no epileptiform features -Stable   Musculoskeletal CP -S/p CPR -CXR on 4/19 without  acute abnormality  -Toradol and lidoderm patch    DVT prophylaxis: Heparin subq  Code Status: Full Family Communication: No family at bedside Disposition Plan: SNF once bed available    Consultants:   PCCM admission  Nephrology  Cardiology  EP  Antimicrobials:  Anti-infectives (From admission, onward)   Start     Dose/Rate Route Frequency Ordered Stop   06/29/17 1945  vancomycin (VANCOCIN) IVPB 1000 mg/200 mL premix     1,000 mg 200 mL/hr over 60 Minutes Intravenous Every 12 hours 06/29/17 1249 06/29/17 1900   06/29/17 1726  vancomycin (VANCOCIN) 500-5 MG/100ML-% IVPB    Note to Pharmacy:  Herriott, Melisa   : cabinet override      06/29/17 1726 06/30/17 0529   06/29/17 0600  gentamicin (GARAMYCIN) 80 mg in sodium chloride 0.9 % 500 mL irrigation     80 mg Irrigation On call 06/28/17 2330 06/29/17 0955   06/29/17 0600  vancomycin (VANCOCIN) IVPB 1000 mg/200 mL premix     1,000 mg 200 mL/hr over 60 Minutes Intravenous On call 06/28/17 2330 06/29/17 1010       Subjective: Patient seen in dialysis.  No new complaints.  Doing well overall.  Agreeable for SNF   Objective: Vitals:   07/01/17 1015 07/01/17 1045 07/01/17 1115 07/01/17 1140  BP: 121/74 116/85 (!) 148/76 122/81  Pulse: 86 84 86 78  Resp:    17  Temp:    (!) 97.4 F (36.3 C)  TempSrc:    Oral  SpO2:    96%  Weight:    97.9  kg (215 lb 13.3 oz)  Height:        Intake/Output Summary (Last 24 hours) at 07/01/2017 1355 Last data filed at 07/01/2017 1140 Gross per 24 hour  Intake 480 ml  Output 2436 ml  Net -1956 ml   Filed Weights   07/01/17 0500 07/01/17 0735 07/01/17 1140  Weight: 96.8 kg (213 lb 4.8 oz) 99.2 kg (218 lb 11.1 oz) 97.9 kg (215 lb 13.3 oz)    Examination: General exam: Appears calm and comfortable  Respiratory system: Clear to auscultation. Respiratory effort normal. Cardiovascular system: S1 & S2 heard, RRR. No JVD, murmurs, rubs, gallops or clicks. No pedal  edema. Gastrointestinal system: Abdomen is nondistended, soft and nontender. No organomegaly or masses felt. Normal bowel sounds heard. Central nervous system: Alert and oriented. No focal neurological deficits. Extremities: Symmetric 5 x 5 power. Skin: No rashes, lesions or ulcers Psychiatry: Judgement and insight appear normal. Mood & affect appropriate.     Data Reviewed: I have personally reviewed following labs and imaging studies  CBC: Recent Labs  Lab 06/27/17 0507 06/28/17 0333 06/29/17 0403 06/30/17 0340 07/01/17 1211  WBC 7.9 6.4 6.1 8.5 7.4  HGB 10.3* 9.5* 10.1* 9.9* 9.3*  HCT 33.1* 30.6* 32.1* 32.6* 29.9*  MCV 93.8 93.0 93.0 93.4 92.3  PLT 361 317 301 328 294   Basic Metabolic Panel: Recent Labs  Lab 06/25/17 0550  06/26/17 0048 06/27/17 0507 06/28/17 0333 06/29/17 0403 06/30/17 0340 07/01/17 0437  NA 135   < > 135 137 138 138 136 132*  K 4.0   < > 3.4* 4.2 4.1 3.9 4.5 4.2  CL 95*   < > 95* 100* 103 99* 98* 93*  CO2 24   < > 26 26 25 30 27 26   GLUCOSE 130*   < > 151* 99 112* 111* 112* 108*  BUN 16   < > 24* 29* 36* 19 14 25*  CREATININE 6.28*   < > 7.80* 8.26* 8.91* 5.91* 5.13* 7.77*  CALCIUM 8.5*   < > 8.3* 8.8* 8.3* 8.3* 8.8* 8.6*  MG 1.9  --  1.9 2.0 2.0 2.0 2.0 2.2  PHOS 2.3*  --  4.2  --   --   --   --   --    < > = values in this interval not displayed.   GFR: Estimated Creatinine Clearance: 11 mL/min (A) (by C-G formula based on SCr of 7.77 mg/dL (H)). Liver Function Tests: Recent Labs  Lab 06/24/17 1730  AST 92*  ALT 65*  ALKPHOS 80  BILITOT 0.9  PROT 7.6  ALBUMIN 3.8   No results for input(s): LIPASE, AMYLASE in the last 168 hours. No results for input(s): AMMONIA in the last 168 hours. Coagulation Profile: Recent Labs  Lab 06/24/17 1730  INR 1.02   Cardiac Enzymes: Recent Labs  Lab 06/24/17 2008 06/25/17 0550 06/25/17 0940 06/26/17 0048  TROPONINI 0.64* 5.90* 5.51* 3.17*   BNP (last 3 results) No results for input(s):  PROBNP in the last 8760 hours. HbA1C: No results for input(s): HGBA1C in the last 72 hours. CBG: Recent Labs  Lab 06/30/17 0746 06/30/17 1112 06/30/17 1611 06/30/17 2100 07/01/17 1218  GLUCAP 98 138* 121* 115* 95   Lipid Profile: No results for input(s): CHOL, HDL, LDLCALC, TRIG, CHOLHDL, LDLDIRECT in the last 72 hours. Thyroid Function Tests: No results for input(s): TSH, T4TOTAL, FREET4, T3FREE, THYROIDAB in the last 72 hours. Anemia Panel: No results for input(s): VITAMINB12, FOLATE, FERRITIN, TIBC, IRON,  RETICCTPCT in the last 72 hours. Sepsis Labs: Recent Labs  Lab 06/24/17 2014 06/24/17 2135 06/24/17 2324 06/25/17 0618 06/26/17 0048 06/27/17 0507  PROCALCITON  --  0.21  --   --  0.97 0.71  LATICACIDVEN 6.10*  --  5.2* 2.6* 2.3*  --     Recent Results (from the past 240 hour(s))  Culture, blood (routine x 2)     Status: None   Collection Time: 06/24/17  9:57 PM  Result Value Ref Range Status   Specimen Description BLOOD RIGHT ARM  Final   Special Requests   Final    BOTTLES DRAWN AEROBIC AND ANAEROBIC Blood Culture results may not be optimal due to an inadequate volume of blood received in culture bottles   Culture   Final    NO GROWTH 5 DAYS Performed at [facility], [address] [place], [place] [address]    Report Status 06/29/2017 FINAL  Final  MRSA PCR Screening     Status: None   Collection Time: 06/24/17 10:33 PM  Result Value Ref Range Status   MRSA by PCR NEGATIVE NEGATIVE Final    Comment:        The GeneXpert MRSA Assay (FDA approved for NASAL specimens only), is one component of a comprehensive MRSA colonization surveillance program. It is not intended to diagnose MRSA infection nor to guide or monitor treatment for MRSA infections. Performed at [facility], [address] [place], [place] [address]   Culture, blood (routine x 2)     Status: None   Collection Time: 06/24/17 10:55 PM  Result Value Ref Range Status    Specimen Description BLOOD RIGHT FOOT  Final   Special Requests   Final    BOTTLES DRAWN AEROBIC ONLY Blood Culture results may not be optimal due to an inadequate volume of blood received in culture bottles   Culture   Final    NO GROWTH 5 DAYS Performed at [facility], [address], [place], [place] [address]    Report Status 06/30/2017 FINAL  Final  Surgical PCR screen     Status: None   Collection Time: 06/29/17 12:14 AM  Result Value Ref Range Status   MRSA, PCR NEGATIVE NEGATIVE Final   Staphylococcus aureus NEGATIVE NEGATIVE Final    Comment: (NOTE) The Xpert SA Assay (FDA approved for NASAL specimens in patients 91 years of age and older), is one component of a comprehensive surveillance program. It is not intended to diagnose infection nor to guide or monitor treatment. Performed at [facility], [address] [place], [place] [address]        Radiology Studies: Dg Chest Port 1 View  Result Date: 06/30/2017 CLINICAL DATA:  Worsening chest pain after ICD. EXAM: PORTABLE CHEST 1 VIEW COMPARISON:  Chest radiograph June 24, 2017 FINDINGS: Cardiac silhouette is mildly enlarged, improved in appearance from prior examination. Mediastinal silhouette is nonsuspicious. No pleural effusion or focal consolidation. LEFT cardiac defibrillator with lead tip projecting midline. LEFT chest wall subcutaneous emphysema consistent with recent placement of ICD. IMPRESSION: Mild cardiomegaly.  No acute pulmonary process. Electronically Signed   By: Awilda Metro M.D.   On: 06/30/2017 05:22      Scheduled Meds: . aspirin EC  81 mg Oral Daily  . atorvastatin  80 mg Oral q1800  . calcitRIOL  0.5 mcg Oral Q M,W,F-HD  . calcitRIOL  0.5 mcg Oral Once  . clopidogrel  75 mg Oral Daily  .  famotidine  20 mg Oral Daily  . heparin  5,000 Units Subcutaneous Q8H  . insulin aspart  0-15 Units Subcutaneous TID WC  . lidocaine  1 patch Transdermal Q24H  . metoprolol tartrate  12.5  mg Oral BID  . multivitamin  1 tablet Oral QHS  . sevelamer carbonate  1,600 mg Oral TID WC  . sodium chloride flush  3 mL Intravenous Q12H   Continuous Infusions: . sodium chloride    . sodium chloride    . sodium chloride    . sodium chloride    . ferric gluconate (FERRLECIT/NULECIT) IV 125 mg (07/01/17 1056)     LOS: 7 days    Time spent: 20 minutes   Noralee Stain, DO [facility] [email or web address] Password [email or web address] 07/01/2017, 1:55 PM

## 2017-07-01 NOTE — Procedures (Signed)
Tol HD Goal 2500cc BFR 350cc/min BP 106/62 Flat affect. Hgb 9.9g K 4.5 Lauris Poag, MD

## 2017-07-02 LAB — CBC
HEMATOCRIT: 30.6 % — AB (ref 39.0–52.0)
HEMOGLOBIN: 9.6 g/dL — AB (ref 13.0–17.0)
MCH: 29.5 pg (ref 26.0–34.0)
MCHC: 31.4 g/dL (ref 30.0–36.0)
MCV: 94.2 fL (ref 78.0–100.0)
Platelets: 326 10*3/uL (ref 150–400)
RBC: 3.25 MIL/uL — ABNORMAL LOW (ref 4.22–5.81)
RDW: 15.8 % — ABNORMAL HIGH (ref 11.5–15.5)
WBC: 7.7 10*3/uL (ref 4.0–10.5)

## 2017-07-02 LAB — GLUCOSE, CAPILLARY
GLUCOSE-CAPILLARY: 112 mg/dL — AB (ref 65–99)
GLUCOSE-CAPILLARY: 105 mg/dL — AB (ref 65–99)
Glucose-Capillary: 130 mg/dL — ABNORMAL HIGH (ref 65–99)
Glucose-Capillary: 107 mg/dL — ABNORMAL HIGH (ref 65–99)

## 2017-07-02 LAB — BASIC METABOLIC PANEL
Anion gap: 11 (ref 5–15)
BUN: 23 mg/dL — ABNORMAL HIGH (ref 6–20)
CHLORIDE: 94 mmol/L — AB (ref 101–111)
CO2: 29 mmol/L (ref 22–32)
Calcium: 9.2 mg/dL (ref 8.9–10.3)
Creatinine, Ser: 7.04 mg/dL — ABNORMAL HIGH (ref 0.61–1.24)
GFR calc non Af Amer: 7 mL/min — ABNORMAL LOW (ref >60)
GFR, EST AFRICAN AMERICAN: 9 mL/min — AB (ref >60)
Glucose, Bld: 102 mg/dL — ABNORMAL HIGH (ref 65–99)
Potassium: 4.4 mmol/L (ref 3.5–5.1)
SODIUM: 134 mmol/L — AB (ref 135–145)

## 2017-07-02 LAB — MAGNESIUM: Magnesium: 2.2 mg/dL (ref 1.7–2.4)

## 2017-07-02 MED ORDER — ASPIRIN 81 MG PO TBEC: 81.0000 mg | DELAYED_RELEASE_TABLET | Freq: Every day | ORAL | 0 refills | Status: AC

## 2017-07-02 MED ORDER — BISACODYL 5 MG PO TBEC
5.0000 mg | DELAYED_RELEASE_TABLET | Freq: Every day | ORAL | Status: DC | PRN
  Administered 2017-07-02: 5 mg via ORAL
  Filled 2017-07-02: qty 1

## 2017-07-02 MED ORDER — OXYCODONE HCL 5 MG PO TABS: 5.0000 mg | ORAL_TABLET | Freq: Four times a day (QID) | ORAL | 0 refills | Status: DC | PRN

## 2017-07-02 MED ORDER — METOPROLOL TARTRATE 25 MG PO TABS
12.5000 mg | ORAL_TABLET | Freq: Two times a day (BID) | ORAL | 0 refills | Status: AC
Start: 2017-07-02 — End: ?

## 2017-07-02 NOTE — Progress Notes (Addendum)
CSW checked on pt's PASRR for possible SNF placement.Marland Kitchen  PASRR is still under review for SNF placement..  Physician was updated on information.    Budd Palmer LCSWA [phone]

## 2017-07-02 NOTE — Discharge Summary (Addendum)
Physician Discharge Summary  Richard Barnett ZOX:[id number] DOB: 23-Oct-1955 DOA: 06/24/2017  PCP: Patient, No Pcp Per  Admit date: 06/24/2017 Discharge date: 07/04/2017  Admitted From: Home Disposition: Unable to find SNF placement, patient desired to discharge home   Recommendations for Outpatient Follow-up:  1. Follow up with PCP in 1 week 2. Follow up with Cardiology on 5/7 as scheduled  3. HD MWF  4. NO DRIVING FOR 6 MONTHS   Discharge Condition: Stable CODE STATUS: Full  Diet recommendation: Heart healthy   Brief/Interim Summary: Richard Barnett is a 62 year old [place] with PMH of CAD s/p stent 01/2017, ESRD on HD MWF (began 04/2017), depression, DM, GERD, HTN who presented to ED on 4/19 from dialysis center. EMS reports that patient received 3 out of 4 hours of dialysis then was noted to have loss of consciousness. AED placed. Patient shocked 3 times. Received 10 minutes of CPR before return of ROSC. Upon arrival to ED patient is confused but awake with contusions to tongue. Initial EKG with sinus tachycardia. CT head negative. Patient was admitted to ICU service with cardiology and nephrology consultation. Transferred to [facility] 4/21. He remained confused regarding events surrounding hospitalization, which was felt to be secondary to transient hypoperfusion after cardiac arrest. Mentation stabilized throughout hospitalization. He underwent heart cath 4/23 which showed diffusely diseased LAD with 40-50% narrowing and subsequently had ICD placed 4/24. PT recommended SNF placement and patient discharged in stable condition.   Discharge Diagnoses:  Principal Problem:   Cardiac arrest [facility]) Active Problems:   Chronic kidney disease, stage IV (severe) (HCC)   Myocardial infarction [facility])   NSTEMI (non-ST elevated myocardial infarction) (HCC)   ESRD on dialysis (HCC)   Suicidal ideation   Status post cardiac arrest -Cardiology following -Heart cath 4/23 showed diffusely diseased LAD with 40-50%  narrowing -EP consulted, ICD placed 4/24. Follow up for device as scheduled by EP. No driving for 6 months   NSTEMI -Troponin peak of 5.9 but unclear if precipitating event for arrest vs demand ischemia resulting from arrest -S/p heart cath 4/23   Chronic diastolic dysfunction -Echo EF 81%, grade 1 dd   CAD status post stents -Continue aspirin, Plavix, Lipitor, lopressor   DM type 2 -SSI   ESRD on hemodialysis MWF -Nephrology following   Acute metabolic encephalopathy -Could be secondary to transient hypoperfusion after cardiac arrest -EEG with abnormal showing very mild generalized cerebral dysfunction with background slowing but no epileptiform features -Stable   Musculoskeletal CP -S/p CPR as well as ICD placement -CXR on 4/19 without acute abnormality  -Pain control    Discharge Instructions  Discharge Instructions    Diet - low sodium heart healthy   Complete by:  As directed    Discharge instructions   Complete by:  As directed    You were cared for by a hospitalist during your hospital stay. If you have any questions about your discharge medications or the care you received while you were in the hospital after you are discharged, you can call the unit and asked to speak with the hospitalist on call if the hospitalist that took care of you is not available. Once you are discharged, your primary care physician will handle any further medical issues. Please note that NO REFILLS for any discharge medications will be authorized once you are discharged, as it is imperative that you return to your primary care physician (or establish a relationship with a primary care physician if you do not have one)  for your aftercare needs so that they can reassess your need for medications and monitor your lab values.   Increase activity slowly   Complete by:  As directed      Allergies as of 07/02/2017      Reactions   Penicillins Anaphylaxis, Swelling, Rash    ANGIOEDEMA/"SWELLING OF ENTIRE BODY" Has patient had a PCN reaction causing immediate rash, facial/tongue/throat swelling, SOB or lightheadedness with hypotension: Yes Has patient had a PCN reaction causing severe rash involving mucus membranes or skin necrosis: No Has patient had a PCN reaction that required hospitalization: No Has patient had a PCN reaction occurring within the last 10 years: No If all of the above answers are "NO", then may proceed with Cephalosporin use.      Medication List    STOP taking these medications   amLODipine 10 MG tablet Commonly known as:  NORVASC   isosorbide mononitrate 30 MG 24 hr tablet Commonly known as:  IMDUR   oxyCODONE-acetaminophen 5-325 MG tablet Commonly known as:  PERCOCET/ROXICET     TAKE these medications   aspirin 81 MG EC tablet Take 1 tablet (81 mg total) by mouth daily. Swallow whole.   atorvastatin 80 MG tablet Commonly known as:  LIPITOR Take 1 tablet (80 mg total) by mouth daily at 6 PM.   clopidogrel 75 MG tablet Commonly known as:  PLAVIX Take 1 tablet (75 mg total) by mouth daily.   latanoprost 0.005 % ophthalmic solution Commonly known as:  XALATAN Place 1 drop into both eyes daily.   metoprolol tartrate 25 MG tablet Commonly known as:  LOPRESSOR Take 0.5 tablets (12.5 mg total) by mouth 2 (two) times daily. What changed:    medication strength  how much to take  when to take this   nitroGLYCERIN 0.4 MG SL tablet Commonly known as:  NITROSTAT DISSOLVE 1 TABLET UNDER THE TONGUE AS NEEDED FOR CHEST PAIN. REPEAT AS NEEDED EVERY 5 MINUTES UP TO A TOTAL OF 3 DOSES What changed:  See the new instructions.   oxyCODONE 5 MG immediate release tablet Commonly known as:  Oxy IR/ROXICODONE Take 1 tablet (5 mg total) by mouth every 6 (six) hours as needed for up to 5 days for severe pain.   RENAGEL 800 MG tablet Generic drug:  sevelamer Take 1,600 mg by mouth 3 (three) times daily with meals.   sertraline 50  MG tablet Commonly known as:  ZOLOFT Take 1 tablet (50 mg total) by mouth at bedtime.   SIMBRINZA 1-0.2 % Susp Generic drug:  Brinzolamide-Brimonidine Place 1 drop into both eyes 2 (two) times daily.       Contact information for follow-up providers    [facility] [address] [facility] Follow up on 07/12/2017.   Specialty:  Cardiology Why:  at [employer] information: [address] [place] [address] [phone]       [facility]. Schedule an appointment as soon as possible for a visit in 1 week(s).   Contact information: [address] [place] [address] [phone]           Contact information for after-discharge care    Destination    [facility] .   Service:  Skilled Nursing Contact information: [address] [place] [address] [phone]                 Allergies  Allergen Reactions  . Penicillins Anaphylaxis, Swelling and  Rash    ANGIOEDEMA/"SWELLING OF ENTIRE BODY" Has patient had a PCN reaction causing immediate rash, facial/tongue/throat swelling, SOB or lightheadedness with hypotension: Yes Has patient had a PCN reaction causing severe rash involving mucus membranes or skin necrosis: No Has patient had a PCN reaction that required hospitalization: No Has patient had a PCN reaction occurring within the last 10 years: No If all of the above answers are "NO", then may proceed with Cephalosporin use.     Consultations:  PCCM admission  Nephrology  Cardiology  EP    Procedures/Studies: Ct Head Wo Contrast  Result Date: 06/24/2017 CLINICAL DATA:  Status post cardiac arrest. Patient arrived from dialysis unresponsive and pulseless. Now having seizure-like activities. EXAM: CT HEAD WITHOUT CONTRAST TECHNIQUE: Contiguous axial images were obtained from the base of the skull through the vertex without intravenous contrast.  COMPARISON:  12/23/2016 head CT FINDINGS: Brain: Chronic moderate small vessel ischemic disease with chronic left basal ganglial lacunar infarct. No acute intracranial hemorrhage, midline shift or edema. Redemonstration of mild to moderate ventriculomegaly. No intra-axial mass nor extra-axial fluid collections. Midline fourth ventricle and basal cisterns. No effacement. No large vascular territory infarct. Vascular: No hyperdense vessel sign.  No unexpected calcifications. Skull: Intact bony calvarium.  Small left mastoid effusion. Sinuses/Orbits: Mild ethmoid sinus mucosal thickening. No air-fluid levels. Intact orbits and globes. Other: None IMPRESSION: Redemonstration of chronic moderate small vessel ischemic disease and left basal ganglial lacunar infarct. Central atrophy. No acute intracranial abnormality. Electronically Signed   By: Tollie Eth M.D.   On: 06/24/2017 18:30   Dg Chest Port 1 View  Result Date: 06/30/2017 CLINICAL DATA:  Worsening chest pain after ICD. EXAM: PORTABLE CHEST 1 VIEW COMPARISON:  Chest radiograph June 24, 2017 FINDINGS: Cardiac silhouette is mildly enlarged, improved in appearance from prior examination. Mediastinal silhouette is nonsuspicious. No pleural effusion or focal consolidation. LEFT cardiac defibrillator with lead tip projecting midline. LEFT chest wall subcutaneous emphysema consistent with recent placement of ICD. IMPRESSION: Mild cardiomegaly.  No acute pulmonary process. Electronically Signed   By: Awilda Metro M.D.   On: 06/30/2017 05:22   Dg Chest Portable 1 View  Result Date: 06/24/2017 CLINICAL DATA:  Status post cardiac arrest EXAM: PORTABLE CHEST 1 VIEW COMPARISON:  05/13/2017 FINDINGS: Cardiac shadow is prominent but accentuated by the portable technique. Lungs are well aerated bilaterally. No focal infiltrate or pneumothorax is seen. No acute bony abnormality is noted. IMPRESSION: No acute abnormality noted. Electronically Signed   By: Alcide Clever  M.D.   On: 06/24/2017 18:10    Echo 4/20 Study Conclusions  - Left ventricle: The cavity size was normal. Wall thickness was   increased in a pattern of mild LVH. Systolic function was normal.   The estimated ejection fraction was in the range of 60% to 65%.   Wall motion was normal; there were no regional wall motion   abnormalities. Doppler parameters are consistent with abnormal   left ventricular relaxation (grade 1 diastolic dysfunction). - Mitral valve: Mildly calcified annulus. There was trivial   regurgitation. - Right atrium: Central venous pressure (est): 3 mm Hg. - Tricuspid valve: There was mild regurgitation. - Pulmonary arteries: PA peak pressure: 28 mm Hg (S). - Pericardium, extracardiac: There was no pericardial effusion.     EEG 4/20 Impression The EEG is abnormal and findings are very mild generalized cerebral dysfunction due to background slowing. Epileptiform features were not seen during this recording.   Heart cath 4/23  Widely patent left  main coronary artery.  Diffusely diseased LAD with 40-50% narrowing in the proximal mid and distal vessel.  Small first diagonal contains ostial 85% stenosis.  Previously placed stent in the small first obtuse marginal contains diffuse ISR with up to 95% narrowing.  There is still antegrade flow.  Generalized nonobstructive atherosclerosis is noted throughout the vessels in the circumflex territory.  Nondominant RCA.  Anterobasal hypokinesis.  Overall EF 60% with elevated EDP of 24 mmHg consistent with chronic diastolic heart failure.  Overall when this study is compared to the late 2018 images, no significant change has occurred.  RECOMMENDATIONS:   The patient has regions that are capable of producing ischemia and possibly are related to the patient's sudden death episode.  There is not a mechanical solution as the vascular territories are supplied by tiny vessels (first diagonal and first obtuse marginal)  which will not have long-term benefit from stenting and or not large enough for bypass grafting.  Consider EP evaluation to discuss the pros and cons of defibrillator therapy.   ICD placement 4/24   Discharge Exam: Vitals:   07/01/17 2125 07/02/17 0555  BP: (!) 147/83 124/71  Pulse: 91 80  Resp: 18 18  Temp: 98.1 F (36.7 C) 98.3 F (36.8 C)  SpO2: 100% 100%    General: Pt is alert, awake, not in acute distress Cardiovascular: RRR, S1/S2 +, no rubs, no gallops, dressing over anterior chest is clean and dry  Respiratory: CTA bilaterally, no wheezing, no rhonchi Abdominal: Soft, NT, ND, bowel sounds + Extremities: no edema, no cyanosis    The results of significant diagnostics from this hospitalization (including imaging, microbiology, ancillary and laboratory) are listed below for reference.     Microbiology: Recent Results (from the past 240 hour(s))  Culture, blood (routine x 2)     Status: None   Collection Time: 06/24/17  9:57 PM  Result Value Ref Range Status   Specimen Description BLOOD RIGHT ARM  Final   Special Requests   Final    BOTTLES DRAWN AEROBIC AND ANAEROBIC Blood Culture results may not be optimal due to an inadequate volume of blood received in culture bottles   Culture   Final    NO GROWTH 5 DAYS Performed at [facility], [address], [place], [place] [address]    Report Status 06/29/2017 FINAL  Final  MRSA PCR Screening     Status: None   Collection Time: 06/24/17 10:33 PM  Result Value Ref Range Status   MRSA by PCR NEGATIVE NEGATIVE Final    Comment:        The GeneXpert MRSA Assay (FDA approved for NASAL specimens only), is one component of a comprehensive MRSA colonization surveillance program. It is not intended to diagnose MRSA infection nor to guide or monitor treatment for MRSA infections. Performed at [facility], [address], [place], [place] [address]   Culture, blood (routine x 2)     Status: None    Collection Time: 06/24/17 10:55 PM  Result Value Ref Range Status   Specimen Description BLOOD RIGHT FOOT  Final   Special Requests   Final    BOTTLES DRAWN AEROBIC ONLY Blood Culture results may not be optimal due to an inadequate volume of blood received in culture bottles   Culture   Final    NO GROWTH 5 DAYS Performed at [facility], [address] [place], [place] [address]    Report Status 06/30/2017 FINAL  Final  Surgical PCR screen     Status: None   Collection Time: 06/29/17 12:14 AM  Result Value Ref Range Status   MRSA, PCR NEGATIVE NEGATIVE Final   Staphylococcus aureus NEGATIVE NEGATIVE Final    Comment: (NOTE) The Xpert SA Assay (FDA approved for NASAL specimens in patients 6 years of age and older), is one component of a comprehensive surveillance program. It is not intended to diagnose infection nor to guide or monitor treatment. Performed at [facility], [address] [place], [place] [address]      Labs: BNP (last 3 results) Recent Labs    08/10/16 0846 05/13/17 1845  BNP 25.2 28.1   Basic Metabolic Panel: Recent Labs  Lab 06/26/17 0048  06/28/17 0333 06/29/17 0403 06/30/17 0340 07/01/17 0437 07/02/17 0502  NA 135   < > 138 138 136 132* 134*  K 3.4*   < > 4.1 3.9 4.5 4.2 4.4  CL 95*   < > 103 99* 98* 93* 94*  CO2 26   < > 25 30 27 26 29   GLUCOSE 151*   < > 112* 111* 112* 108* 102*  BUN 24*   < > 36* 19 14 25* 23*  CREATININE 7.80*   < > 8.91* 5.91* 5.13* 7.77* 7.04*  CALCIUM 8.3*   < > 8.3* 8.3* 8.8* 8.6* 9.2  MG 1.9   < > 2.0 2.0 2.0 2.2 2.2  PHOS 4.2  --   --   --   --   --   --    < > = values in this interval not displayed.   Liver Function Tests: No results for input(s): AST, ALT, ALKPHOS, BILITOT, PROT, ALBUMIN in the last 168 hours. No results for input(s): LIPASE, AMYLASE in the last 168 hours. No results for input(s): AMMONIA in the last 168 hours. CBC: Recent Labs  Lab 06/28/17 0333 06/29/17 0403  06/30/17 0340 07/01/17 1211 07/02/17 0502  WBC 6.4 6.1 8.5 7.4 7.7  HGB 9.5* 10.1* 9.9* 9.3* 9.6*  HCT 30.6* 32.1* 32.6* 29.9* 30.6*  MCV 93.0 93.0 93.4 92.3 94.2  PLT 317 301 328 294 326   Cardiac Enzymes: Recent Labs  Lab 06/25/17 0940 06/26/17 0048  TROPONINI 5.51* 3.17*   BNP: Invalid input(s): POCBNP CBG: Recent Labs  Lab 06/30/17 2100 07/01/17 1218 07/01/17 1620 07/01/17 2123 07/02/17 0746  GLUCAP 115* 95 113* 136* 112*   D-Dimer No results for input(s): DDIMER in the last 72 hours. Hgb A1c No results for input(s): HGBA1C in the last 72 hours. Lipid Profile No results for input(s): CHOL, HDL, LDLCALC, TRIG, CHOLHDL, LDLDIRECT in the last 72 hours. Thyroid function studies No results for input(s): TSH, T4TOTAL, T3FREE, THYROIDAB in the last 72 hours.  Invalid input(s): FREET3 Anemia work up No results for input(s): VITAMINB12, FOLATE, FERRITIN, TIBC, IRON, RETICCTPCT in the last 72 hours. Urinalysis    Component Value Date/Time   COLORURINE YELLOW 06/26/2017 0658   APPEARANCEUR CLEAR 06/26/2017 0658   LABSPEC 1.007 06/26/2017 0658   PHURINE 8.0 06/26/2017 0658   GLUCOSEU NEGATIVE 06/26/2017 0658   HGBUR SMALL (A) 06/26/2017 0658   BILIRUBINUR NEGATIVE 06/26/2017 0658   KETONESUR NEGATIVE 06/26/2017 0658   PROTEINUR >=300 (A) 06/26/2017 0658   UROBILINOGEN 0.2 02/21/2015 0955   NITRITE NEGATIVE 06/26/2017 0658   LEUKOCYTESUR NEGATIVE 06/26/2017 0658   Sepsis Labs Invalid input(s): PROCALCITONIN,  WBC,  LACTICIDVEN Microbiology Recent Results (from the past 240 hour(s))  Culture, blood (routine x 2)  Status: None   Collection Time: 06/24/17  9:57 PM  Result Value Ref Range Status   Specimen Description BLOOD RIGHT ARM  Final   Special Requests   Final    BOTTLES DRAWN AEROBIC AND ANAEROBIC Blood Culture results may not be optimal due to an inadequate volume of blood received in culture bottles   Culture   Final    NO GROWTH 5 DAYS Performed  at [facility], [address] [place], [place] [address]    Report Status 06/29/2017 FINAL  Final  MRSA PCR Screening     Status: None   Collection Time: 06/24/17 10:33 PM  Result Value Ref Range Status   MRSA by PCR NEGATIVE NEGATIVE Final    Comment:        The GeneXpert MRSA Assay (FDA approved for NASAL specimens only), is one component of a comprehensive MRSA colonization surveillance program. It is not intended to diagnose MRSA infection nor to guide or monitor treatment for MRSA infections. Performed at [facility], [address] [place], [place] [address]   Culture, blood (routine x 2)     Status: None   Collection Time: 06/24/17 10:55 PM  Result Value Ref Range Status   Specimen Description BLOOD RIGHT FOOT  Final   Special Requests   Final    BOTTLES DRAWN AEROBIC ONLY Blood Culture results may not be optimal due to an inadequate volume of blood received in culture bottles   Culture   Final    NO GROWTH 5 DAYS Performed at [facility] Lab, [address], [place], [place] [address]    Report Status 06/30/2017 FINAL  Final  Surgical PCR screen     Status: None   Collection Time: 06/29/17 12:14 AM  Result Value Ref Range Status   MRSA, PCR NEGATIVE NEGATIVE Final   Staphylococcus aureus NEGATIVE NEGATIVE Final    Comment: (NOTE) The Xpert SA Assay (FDA approved for NASAL specimens in patients 42 years of age and older), is one component of a comprehensive surveillance program. It is not intended to diagnose infection nor to guide or monitor treatment. Performed at [facility], [address], [place], [place] [address]      Patient was seen and examined on the day of discharge and was found to be in stable condition. Time coordinating discharge: 40 minutes including assessment and coordination of care, as well as examination of the patient.   SIGNED:  Noralee Stain, DO [facility] Pager [phone]  If 7PM-7AM, please  contact night-coverage [email or web address] Password [email or web address] 07/02/2017, 8:16 AM

## 2017-07-02 NOTE — Progress Notes (Signed)
Patient supposed to discharge to SNF today. Called by SW that patient's PASRR still under review and without it, cannot discharge to SNF. Patient lives at home alone, I don't think he's safe to go home with home health. Will cancel discharge today and await PASRR for SNF placement.   Noralee Stain, DO [facility] [email or web address] Password [email or web address] 07/02/2017, 10:46 AM

## 2017-07-02 NOTE — Progress Notes (Signed)
Richard Barnett Progress Note   Dialysis Orders: [facility] 4 hours EDW 98 kg MWF HD Bath 2K/ 2 Ca, Dialyzer F180 BFR 400 mL/ min, Heparin 5000 u bolus. Access LUE AVG. Mircera 150 mcg q 2 weeks, last given 4/10 Venofer 100 mg q 5 rx, started 4/17 Calcitriol 0.5 mcg q rx   Assessment/Plan: 1. Cardiac arrest /NSTEMIs/p AICD 4/24 - heart cath 4/23 showed diffusely diseased LAD with 40 - 50% narrowoing; still with MS CP 2/2 CPR 2. ESRD -MWF- HD yesterday -slightly lower edw for dc 3. Anemia - hgb 9.6 -due for redose this week - but ordered for next - will be sure that he gets at outpt HD next week 4. Secondary hyperparathyroidism - binders and calctiriol  5. HTN/volume - controlled with volume removal and MTP 6. Nutrition - renal diet /vit 7. Depression - off prior meds  Sheffield Slider, PA-C [facility] [phone] 07/02/2017,8:52 AM  LOS: 8 days   Subjective:   For d/c today, he hopes - going to [facility] but notes indicated he has been accepted at [facility]  Objective Vitals:   07/01/17 2125 07/02/17 0555 07/02/17 0630 07/02/17 0825  BP: (!) 147/83 124/71  126/71  Pulse: 91 80  85  Resp: 18 18    Temp: 98.1 F (36.7 C) 98.3 F (36.8 C)    TempSrc: Oral Oral    SpO2: 100% 100%    Weight:   97.7 kg (215 lb 6.2 oz)   Height:       Physical Exam General: NAD Heart: RRR dressing intact anteriorly Lungs: no rales Abdomen: soft NT Extremities: no LE edema Dialysis Access: left upper AVGG + bruit   Additional Objective Labs: Basic Metabolic Panel: Recent Labs  Lab 06/26/17 0048  06/30/17 0340 07/01/17 0437 07/02/17 0502  NA 135   < > 136 132* 134*  K 3.4*   < > 4.5 4.2 4.4  CL 95*   < > 98* 93* 94*  CO2 26   < > 27 26 29   GLUCOSE 151*   < > 112* 108* 102*  BUN 24*   < > 14 25* 23*  CREATININE 7.80*   < > 5.13* 7.77* 7.04*  CALCIUM 8.3*   < > 8.8* 8.6* 9.2  PHOS 4.2  --   --   --   --    < > = values in this interval not  displayed.   Liver Function Tests: No results for input(s): AST, ALT, ALKPHOS, BILITOT, PROT, ALBUMIN in the last 168 hours. No results for input(s): LIPASE, AMYLASE in the last 168 hours. CBC: Recent Labs  Lab 06/28/17 0333 06/29/17 0403 06/30/17 0340 07/01/17 1211 07/02/17 0502  WBC 6.4 6.1 8.5 7.4 7.7  HGB 9.5* 10.1* 9.9* 9.3* 9.6*  HCT 30.6* 32.1* 32.6* 29.9* 30.6*  MCV 93.0 93.0 93.4 92.3 94.2  PLT 317 301 328 294 326   Blood Culture    Component Value Date/Time   SDES BLOOD RIGHT FOOT 06/24/2017 2255   SPECREQUEST  06/24/2017 2255    BOTTLES DRAWN AEROBIC ONLY Blood Culture results may not be optimal due to an inadequate volume of blood received in culture bottles   CULT  06/24/2017 2255    NO GROWTH 5 DAYS Performed at [facility], [address] [place], [place] [address]    REPTSTATUS 06/30/2017 FINAL 06/24/2017 2255    Cardiac Enzymes: Recent Labs  Lab 06/25/17 0940 06/26/17 0048  TROPONINI 5.51*  3.17*   CBG: Recent Labs  Lab 06/30/17 2100 07/01/17 1218 07/01/17 1620 07/01/17 2123 07/02/17 0746  GLUCAP 115* 95 113* 136* 112*   Iron Studies: No results for input(s): IRON, TIBC, TRANSFERRIN, FERRITIN in the last 72 hours. Lab Results  Component Value Date   INR 1.02 06/24/2017   INR 0.98 05/13/2017   INR 0.99 01/14/2017   Studies/Results: No results found. Medications: . sodium chloride    . sodium chloride    . sodium chloride    . sodium chloride    . ferric gluconate (FERRLECIT/NULECIT) IV Stopped (07/01/17 1156)   . aspirin EC  81 mg Oral Daily  . atorvastatin  80 mg Oral q1800  . calcitRIOL  0.5 mcg Oral Q M,W,F-HD  . calcitRIOL  0.5 mcg Oral Once  . clopidogrel  75 mg Oral Daily  . famotidine  20 mg Oral Daily  . heparin  5,000 Units Subcutaneous Q8H  . insulin aspart  0-15 Units Subcutaneous TID WC  . lidocaine  1 patch Transdermal Q24H  . metoprolol tartrate  12.5 mg Oral BID  . multivitamin  1 tablet Oral QHS  .  sevelamer carbonate  1,600 mg Oral TID WC  . sodium chloride flush  3 mL Intravenous Q12H

## 2017-07-03 LAB — BASIC METABOLIC PANEL
Anion gap: 11 (ref 5–15)
BUN: 32 mg/dL — ABNORMAL HIGH (ref 6–20)
CHLORIDE: 97 mmol/L — AB (ref 101–111)
CO2: 26 mmol/L (ref 22–32)
Calcium: 8.8 mg/dL — ABNORMAL LOW (ref 8.9–10.3)
Creatinine, Ser: 8.65 mg/dL — ABNORMAL HIGH (ref 0.61–1.24)
GFR calc non Af Amer: 6 mL/min — ABNORMAL LOW (ref >60)
GFR, EST AFRICAN AMERICAN: 7 mL/min — AB (ref >60)
Glucose, Bld: 106 mg/dL — ABNORMAL HIGH (ref 65–99)
POTASSIUM: 4.4 mmol/L (ref 3.5–5.1)
SODIUM: 134 mmol/L — AB (ref 135–145)

## 2017-07-03 LAB — GLUCOSE, CAPILLARY
GLUCOSE-CAPILLARY: 108 mg/dL — AB (ref 65–99)
GLUCOSE-CAPILLARY: 139 mg/dL — AB (ref 65–99)
Glucose-Capillary: 108 mg/dL — ABNORMAL HIGH (ref 65–99)
Glucose-Capillary: 112 mg/dL — ABNORMAL HIGH (ref 65–99)

## 2017-07-03 LAB — MAGNESIUM: Magnesium: 2.3 mg/dL (ref 1.7–2.4)

## 2017-07-03 LAB — CBC
HEMATOCRIT: 27.3 % — AB (ref 39.0–52.0)
HEMOGLOBIN: 8.5 g/dL — AB (ref 13.0–17.0)
MCH: 29.1 pg (ref 26.0–34.0)
MCHC: 31.1 g/dL (ref 30.0–36.0)
MCV: 93.5 fL (ref 78.0–100.0)
Platelets: 327 10*3/uL (ref 150–400)
RBC: 2.92 MIL/uL — ABNORMAL LOW (ref 4.22–5.81)
RDW: 15.6 % — ABNORMAL HIGH (ref 11.5–15.5)
WBC: 6.2 10*3/uL (ref 4.0–10.5)

## 2017-07-03 MED ORDER — DARBEPOETIN ALFA 150 MCG/0.3ML IJ SOSY: 150.0000 ug | PREFILLED_SYRINGE | INTRAMUSCULAR | Status: DC

## 2017-07-03 NOTE — Progress Notes (Signed)
[facility] Progress Note   Dialysis Orders: [facility] 4 hoursEDW 98 kg MWF HD Bath2K/ 2 Ca, DialyzerF180 BFR 400 mL/ min, Heparin5000 u bolus. AccessLUE AVG. Mircera 150 mcg q 2 weeks, last given 4/10 Venofer 100 mg q 5 rx, started 4/17 Calcitriol 0.5 mcg q rx   Assessment/Plan: 1. Cardiac arrest /NSTEMIs/p AICD 4/24 - heart cath 4/23 showed diffusely diseased LAD with 40 - 50% narrowoing; still with MS CP 2/2 CPR- explained why he had AICD inserted 2. ESRD -MWF- HD Monday first round in case he can be discharged 3. Anemia - hgb 9.6> 8.5  -due for redose last ;week - will reschedule for Monday- s/p IV Fe 4. Secondary hyperparathyroidism - binders and calctiriol  5. HTN/volume - controlled with volume removal and MTP 6. Nutrition - renal diet /vit 7. Depression - off prior meds- has a short fuse- irritable - given prior history SI, probably needs to be on something- not much insight into his situation 8. Disp - SNF planned  Sheffield Slider, PA-C [facility] Beeper [phone] 07/03/2017,8:08 AM  LOS: 9 days   Subjective:   Irritated about not knowing where he is going. C/o discomfort from incisions.  Objective Vitals:   07/02/17 1721 07/02/17 2020 07/03/17 0450 07/03/17 0452  BP: (!) 138/43 (!) 143/81 124/78   Pulse: 79 82 77   Resp:  18 18   Temp:  (!) 97.5 F (36.4 C) 98.2 F (36.8 C)   TempSrc:  Oral Oral   SpO2:  99% 100%   Weight:    98.5 kg (217 lb 2.5 oz)  Height:       Physical Exam General: Heart: Lungs: Abdomen: Extremities: Dialysis Access: left upper AVGG   Additional Objective Labs: Basic Metabolic Panel: Recent Labs  Lab 07/01/17 0437 07/02/17 0502 07/03/17 0408  NA 132* 134* 134*  K 4.2 4.4 4.4  CL 93* 94* 97*  CO2 26 29 26   GLUCOSE 108* 102* 106*  BUN 25* 23* 32*  CREATININE 7.77* 7.04* 8.65*  CALCIUM 8.6* 9.2 8.8*   Liver Function Tests: No results for input(s): AST, ALT, ALKPHOS, BILITOT, PROT,  ALBUMIN in the last 168 hours. No results for input(s): LIPASE, AMYLASE in the last 168 hours. CBC: Recent Labs  Lab 06/29/17 0403 06/30/17 0340 07/01/17 1211 07/02/17 0502 07/03/17 0408  WBC 6.1 8.5 7.4 7.7 6.2  HGB 10.1* 9.9* 9.3* 9.6* 8.5*  HCT 32.1* 32.6* 29.9* 30.6* 27.3*  MCV 93.0 93.4 92.3 94.2 93.5  PLT 301 328 294 326 327   Blood Culture    Component Value Date/Time   SDES BLOOD RIGHT FOOT 06/24/2017 2255   SPECREQUEST  06/24/2017 2255    BOTTLES DRAWN AEROBIC ONLY Blood Culture results may not be optimal due to an inadequate volume of blood received in culture bottles   CULT  06/24/2017 2255    NO GROWTH 5 DAYS Performed at [facility], [address] [place], [place] [address]    REPTSTATUS 06/30/2017 FINAL 06/24/2017 2255    Cardiac Enzymes: No results for input(s): CKTOTAL, CKMB, CKMBINDEX, TROPONINI in the last 168 hours. CBG: Recent Labs  Lab 07/02/17 0746 07/02/17 1218 07/02/17 1643 07/02/17 2121 07/03/17 0749  GLUCAP 112* 130* 107* 105* 108*   Iron Studies: No results for input(s): IRON, TIBC, TRANSFERRIN, FERRITIN in the last 72 hours. Lab Results  Component Value Date   INR 1.02 06/24/2017   INR 0.98 05/13/2017   INR 0.99 01/14/2017   Studies/Results:  No results found. Medications: . sodium chloride    . sodium chloride    . sodium chloride    . sodium chloride    . ferric gluconate (FERRLECIT/NULECIT) IV Stopped (07/01/17 1156)   . aspirin EC  81 mg Oral Daily  . atorvastatin  80 mg Oral q1800  . calcitRIOL  0.5 mcg Oral Q M,W,F-HD  . calcitRIOL  0.5 mcg Oral Once  . clopidogrel  75 mg Oral Daily  . famotidine  20 mg Oral Daily  . heparin  5,000 Units Subcutaneous Q8H  . insulin aspart  0-15 Units Subcutaneous TID WC  . lidocaine  1 patch Transdermal Q24H  . metoprolol tartrate  12.5 mg Oral BID  . multivitamin  1 tablet Oral QHS  . sevelamer carbonate  1,600 mg Oral TID WC  . sodium chloride flush  3 mL Intravenous Q12H

## 2017-07-03 NOTE — Progress Notes (Signed)
A/O x4.  No complaints voiced during this shift.  Hinton Dyer, RN

## 2017-07-03 NOTE — Progress Notes (Signed)
  PROGRESS NOTE  Patient remains stable for discharge to SNF when PASRR received. Called daughter at patient's request to give her update and left a voicemail.   Noralee Stain, DO [facility] [email or web address] Password [email or web address] 07/03/2017, 10:02 AM

## 2017-07-03 NOTE — Progress Notes (Signed)
CSW checked patient's PASRR request. It  is under review.  Budd Palmer LCSWA [phone]

## 2017-07-04 LAB — CBC
HEMATOCRIT: 26.8 % — AB (ref 39.0–52.0)
Hemoglobin: 8.4 g/dL — ABNORMAL LOW (ref 13.0–17.0)
MCH: 29.1 pg (ref 26.0–34.0)
MCHC: 31.3 g/dL (ref 30.0–36.0)
MCV: 92.7 fL (ref 78.0–100.0)
Platelets: 345 10*3/uL (ref 150–400)
RBC: 2.89 MIL/uL — ABNORMAL LOW (ref 4.22–5.81)
RDW: 15.5 % (ref 11.5–15.5)
WBC: 7 10*3/uL (ref 4.0–10.5)

## 2017-07-04 LAB — GLUCOSE, CAPILLARY: GLUCOSE-CAPILLARY: 121 mg/dL — AB (ref 65–99)

## 2017-07-04 LAB — BASIC METABOLIC PANEL
Anion gap: 10 (ref 5–15)
BUN: 38 mg/dL — AB (ref 6–20)
CHLORIDE: 100 mmol/L — AB (ref 101–111)
CO2: 26 mmol/L (ref 22–32)
CREATININE: 9.46 mg/dL — AB (ref 0.61–1.24)
Calcium: 8.9 mg/dL (ref 8.9–10.3)
GFR calc Af Amer: 6 mL/min — ABNORMAL LOW (ref >60)
GFR calc non Af Amer: 5 mL/min — ABNORMAL LOW (ref >60)
Glucose, Bld: 92 mg/dL (ref 65–99)
POTASSIUM: 4.4 mmol/L (ref 3.5–5.1)
SODIUM: 136 mmol/L (ref 135–145)

## 2017-07-04 LAB — MAGNESIUM: Magnesium: 2.3 mg/dL (ref 1.7–2.4)

## 2017-07-04 MED ORDER — ALTEPLASE 2 MG IJ SOLR: 2.0000 mg | Freq: Once | INTRAMUSCULAR | Status: DC | PRN

## 2017-07-04 MED ORDER — HEPARIN SODIUM (PORCINE) 1000 UNIT/ML DIALYSIS
1000.0000 [IU] | INTRAMUSCULAR | Status: DC | PRN
  Filled 2017-07-04: qty 1

## 2017-07-04 MED ORDER — CALCITRIOL 0.5 MCG PO CAPS
ORAL_CAPSULE | ORAL | Status: AC
  Administered 2017-07-04: 0.5 ug via ORAL
  Filled 2017-07-04: qty 1

## 2017-07-04 MED ORDER — SODIUM CHLORIDE 0.9 % IV SOLN: 100.0000 mL | INTRAVENOUS | Status: DC | PRN

## 2017-07-04 MED ORDER — LIDOCAINE HCL (PF) 1 % IJ SOLN: 5.0000 mL | INTRAMUSCULAR | Status: DC | PRN

## 2017-07-04 MED ORDER — LIDOCAINE-PRILOCAINE 2.5-2.5 % EX CREA
1.0000 "application " | TOPICAL_CREAM | CUTANEOUS | Status: DC | PRN
  Filled 2017-07-04: qty 5

## 2017-07-04 MED ORDER — PENTAFLUOROPROP-TETRAFLUOROETH EX AERO: 1.0000 "application " | INHALATION_SPRAY | CUTANEOUS | Status: DC | PRN

## 2017-07-04 MED ORDER — HEPARIN SODIUM (PORCINE) 1000 UNIT/ML DIALYSIS
20.0000 [IU]/kg | INTRAMUSCULAR | Status: DC | PRN
  Filled 2017-07-04: qty 2

## 2017-07-04 NOTE — Progress Notes (Signed)
PT Cancellation Note  Patient Details Name: Richard Barnett MRN: [id number] DOB: 08-19-1955   Cancelled Treatment:    Reason Eval/Treat Not Completed: Patient at procedure or test/unavailable(HD in am. Will check back as able. )   Amadeo Garnet [name] 07/04/2017, 1:59 PM  Claire Dolores,PT Acute Rehabilitation [phone] (pager)

## 2017-07-04 NOTE — Care Management Note (Signed)
Case Management Note  Patient Details  Name: Richard Barnett MRN: [id number] Date of Birth: 12-30-1955  Subjective/Objective:  Pt presented for Cardiac Arrest- ESRD on HD. Plan was for SNF 07-04-17- plans changed to home with [facility]. Pt is without PCP at this time. Pt will have services via [facility]- CM did call Physician Advisor and he is agreeable with [facility]. Referral sent to [facility]. SOC to begin within 24-48 hours of admission to home.                  Action/Plan: CM did call [facility] to see if they can assist with patient in the Community. CM did call [facility] @ [phone] to see if the patient has Personal Care Services. Pt states he is getting services daily and can't remember name of Aide. Pt states he has transportation to HD. No Pharmacy Listed- previously he was going to [facility]. CM called [facility] and pt has not been to that location since November. Pt will need to go to [facility] to get medications. CSW assisted with transportation home. Notified by Myer Peer with [facility] ([facility]) Jovita Kussmaul: PCP-[facility] Agency: [phone]. CM did ask [facility] to see if CSW can assist with ALF for patient (memory issues vs cognitive deficits). Pt needs to be followed closely in the community- 38 % risk for readmission. Pt states he has a daughter, however she works during the day.   Expected Discharge Date:  07/04/17               Expected Discharge Plan:  Home w Home Health Services  In-House Referral:  Clinical Social Work  Discharge planning Services  CM Consult, [facility] / [facility] (established/new)  Post Acute Care Choice:  Home Health Choice offered to:  Patient  DME Arranged:  N/A DME Agency:  NA  HH Arranged:  RN, Disease Management, PT, Social Work, Nurse's Aide HH Agency:  [facility]  Status of Service:  Completed, signed off  If discussed at [employer], dates discussed:     Additional Comments:  Gala Lewandowsky, RN 07/04/2017, 3:44 PM

## 2017-07-04 NOTE — Progress Notes (Signed)
[facility] Progress Note  Subjective: no c/o on hd now  Vitals:   07/04/17 0915 07/04/17 0945 07/04/17 1015 07/04/17 1045  BP: (!) 142/87 120/76 133/74 108/68  Pulse: 93 (!) 103 (!) 101 (!) 104  Resp:      Temp:      TempSrc:      SpO2:      Weight:      Height:        Inpatient medications: . aspirin EC  81 mg Oral Daily  . atorvastatin  80 mg Oral q1800  . calcitRIOL  0.5 mcg Oral Q M,W,F-HD  . clopidogrel  75 mg Oral Daily  . [START ON 07/05/2017] darbepoetin (ARANESP) injection - DIALYSIS  150 mcg Intravenous Q Tue-HD  . famotidine  20 mg Oral Daily  . heparin  5,000 Units Subcutaneous Q8H  . insulin aspart  0-15 Units Subcutaneous TID WC  . lidocaine  1 patch Transdermal Q24H  . metoprolol tartrate  12.5 mg Oral BID  . multivitamin  1 tablet Oral QHS  . sevelamer carbonate  1,600 mg Oral TID WC  . sodium chloride flush  3 mL Intravenous Q12H   . sodium chloride    . sodium chloride     sodium chloride, sodium chloride, acetaminophen, bisacodyl, ondansetron (ZOFRAN) IV, oxyCODONE, sodium chloride flush  Exam: Alert no distress on hd No jvd Chest clear bilat Cor reg no mrg Abd soft ntnd Ext no edema Left arm avg w bruit  Dialysis: [facility] 4h  98kg  2/2 bath  Hep 5000  Left arm avg -mircera 150 ug every 2 wks last 4/10 -venofer 100mg  x 5 started 4/17 (done) -calctriol 0.5 ug tiw po      Impression: 1  Cardiac arrest/ nstemi/ sp defib-icd on 4/24 2  esrd hd mwf 3  Anemia ckd getting esa q mon here 4  mbd ckd binders and vdra 5 depression per primary 6  Htn/ vol close to dry  Plan - dialysis today   Vinson Moselle MD [facility] pager [phone]   07/04/2017, 12:11 PM   Recent Labs  Lab 07/02/17 0502 07/03/17 0408 07/04/17 0614  NA 134* 134* 136  K 4.4 4.4 4.4  CL 94* 97* 100*  CO2 29 26 26   GLUCOSE 102* 106* 92  BUN 23* 32* 38*  CREATININE 7.04* 8.65* 9.46*  CALCIUM 9.2 8.8* 8.9   No results for input(s):  AST, ALT, ALKPHOS, BILITOT, PROT, ALBUMIN in the last 168 hours. Recent Labs  Lab 07/02/17 0502 07/03/17 0408 07/04/17 0614  WBC 7.7 6.2 7.0  HGB 9.6* 8.5* 8.4*  HCT 30.6* 27.3* 26.8*  MCV 94.2 93.5 92.7  PLT 326 327 345   Iron/TIBC/Ferritin/ %Sat No results found for: IRON, TIBC, FERRITIN, IRONPCTSAT

## 2017-07-04 NOTE — Progress Notes (Signed)
CSW received patient's PASRR # [id number]. CSW to confirm SNF bed availability and support with discharge.  Abigail Butts, LCSWA [phone]

## 2017-07-04 NOTE — Progress Notes (Signed)
PT Cancellation Note  Patient Details Name: Richard Barnett MRN: [id number] DOB: 1955-12-16   Cancelled Treatment:    Reason Eval/Treat Not Completed: Other (comment)(Pt discharging and waiting at desk to leave)   Berline Lopes 07/04/2017, 4:16 PM [facility] Acute Rehabilitation [phone] (pager)

## 2017-07-04 NOTE — Progress Notes (Addendum)
  PROGRESS NOTE  Patient seen in HD. Stable over the weekend without acute issues. Doing well overall. Stable for discharge to SNF today. DC summary updated. Called daughter again with update with no answer. Patient stated he talked to her yesterday with updates.   Addendum: Unfortunately SNF bed now unavailable and difficulty finding placement due to medicaid, HD, psych history. Patient wants to discharge home. Will order home health PT OT RN Aide SW for support. Discussed with caregiver over the phone.    Noralee Stain, DO [facility] [email or web address] Password [facility] 07/04/2017, 10:49 AM

## 2017-07-04 NOTE — Progress Notes (Signed)
CSW following for disposition plan. Unfortunately SNF bed at [facility] identified last week is no longer available. CSW has followed up with other facilities and patient has no other bed offers due to history of suicidal ideation, behaviors, being on dialysis, and having Medicaid.   CSW met with patient at bedside. Patient alert and oriented, sitting up in bed. CSW explained the barriers with SNF placement and informed patient that there are no SNF bed offers at this time. Patient stated, "I want to go home." Patient consented for CSW to contact neighbor, Darden Palmer - patient stated she helps him at home and may be able to provide transportation home. CSW called [place] and she indicated she has not been in touch with patient in nearly a year.  CSW referred to [facility] for home health needs. CSW can support with transportation home for patient if needed.  Estanislado Emms, [place]

## 2017-07-04 NOTE — Progress Notes (Signed)
Patient received discharge information and acknowledged understanding of it. Patient received prescriptions. Patient IVs were removed.  

## 2017-07-06 NOTE — H&P (Signed)
ICD Criteria  Current LVEF:55%. Within 12 months prior to implant: Yes   Heart failure history: No  Cardiomyopathy history: No.  Atrial Fibrillation/Atrial Flutter: No.  Ventricular tachycardia history: No.  Cardiac arrest history: Yes, Ventricular Fibrillation.  History of syndromes with risk of sudden death: No.  Previous ICD: No.  Current ICD indication: Secondary  PPM indication: No.   Class I or II Bradycardia indication present: No  Beta Blocker therapy for 3 or more months: Yes, prescribed.   Ace Inhibitor/ARB therapy for 3 or more months: No, medical reason.

## 2017-07-07 ENCOUNTER — Emergency Department (HOSPITAL_COMMUNITY): Payer: Medicaid Other

## 2017-07-07 ENCOUNTER — Encounter (HOSPITAL_COMMUNITY): Payer: Self-pay

## 2017-07-07 ENCOUNTER — Other Ambulatory Visit: Payer: Self-pay

## 2017-07-07 ENCOUNTER — Inpatient Hospital Stay (HOSPITAL_COMMUNITY)
Admission: EM | Admit: 2017-07-07 | Discharge: 2017-07-23 | DRG: 280 | Disposition: A | Discharge status: Skilled Nursing Facility | Payer: Medicaid Other | Attending: Internal Medicine | Admitting: Internal Medicine

## 2017-07-07 DIAGNOSIS — M1990 Unspecified osteoarthritis, unspecified site: Secondary | ICD-10-CM | POA: Diagnosis not present

## 2017-07-07 DIAGNOSIS — Z794 Long term (current) use of insulin: Secondary | ICD-10-CM | POA: Diagnosis not present

## 2017-07-07 DIAGNOSIS — F4325 Adjustment disorder with mixed disturbance of emotions and conduct: Secondary | ICD-10-CM | POA: Diagnosis present

## 2017-07-07 DIAGNOSIS — I2510 Atherosclerotic heart disease of native coronary artery without angina pectoris: Secondary | ICD-10-CM | POA: Diagnosis present

## 2017-07-07 DIAGNOSIS — Z95810 Presence of automatic (implantable) cardiac defibrillator: Secondary | ICD-10-CM | POA: Diagnosis not present

## 2017-07-07 DIAGNOSIS — Z87891 Personal history of nicotine dependence: Secondary | ICD-10-CM

## 2017-07-07 DIAGNOSIS — R079 Chest pain, unspecified: Secondary | ICD-10-CM | POA: Diagnosis present

## 2017-07-07 DIAGNOSIS — Z86718 Personal history of other venous thrombosis and embolism: Secondary | ICD-10-CM

## 2017-07-07 DIAGNOSIS — Z992 Dependence on renal dialysis: Secondary | ICD-10-CM | POA: Diagnosis not present

## 2017-07-07 DIAGNOSIS — Z7902 Long term (current) use of antithrombotics/antiplatelets: Secondary | ICD-10-CM | POA: Diagnosis not present

## 2017-07-07 DIAGNOSIS — Z880 Allergy status to penicillin: Secondary | ICD-10-CM

## 2017-07-07 DIAGNOSIS — I252 Old myocardial infarction: Secondary | ICD-10-CM | POA: Diagnosis not present

## 2017-07-07 DIAGNOSIS — F329 Major depressive disorder, single episode, unspecified: Secondary | ICD-10-CM | POA: Diagnosis not present

## 2017-07-07 DIAGNOSIS — R0602 Shortness of breath: Secondary | ICD-10-CM

## 2017-07-07 DIAGNOSIS — I214 Non-ST elevation (NSTEMI) myocardial infarction: Secondary | ICD-10-CM | POA: Diagnosis present

## 2017-07-07 DIAGNOSIS — F332 Major depressive disorder, recurrent severe without psychotic features: Secondary | ICD-10-CM | POA: Diagnosis present

## 2017-07-07 DIAGNOSIS — E1122 Type 2 diabetes mellitus with diabetic chronic kidney disease: Secondary | ICD-10-CM | POA: Diagnosis present

## 2017-07-07 DIAGNOSIS — E785 Hyperlipidemia, unspecified: Secondary | ICD-10-CM | POA: Diagnosis present

## 2017-07-07 DIAGNOSIS — Z7982 Long term (current) use of aspirin: Secondary | ICD-10-CM | POA: Diagnosis not present

## 2017-07-07 DIAGNOSIS — Z8674 Personal history of sudden cardiac arrest: Secondary | ICD-10-CM | POA: Diagnosis not present

## 2017-07-07 DIAGNOSIS — R071 Chest pain on breathing: Secondary | ICD-10-CM | POA: Diagnosis not present

## 2017-07-07 DIAGNOSIS — Z955 Presence of coronary angioplasty implant and graft: Secondary | ICD-10-CM | POA: Diagnosis not present

## 2017-07-07 DIAGNOSIS — N189 Chronic kidney disease, unspecified: Secondary | ICD-10-CM

## 2017-07-07 DIAGNOSIS — E119 Type 2 diabetes mellitus without complications: Secondary | ICD-10-CM

## 2017-07-07 DIAGNOSIS — R402252 Coma scale, best verbal response, oriented, at arrival to emergency department: Secondary | ICD-10-CM | POA: Diagnosis present

## 2017-07-07 DIAGNOSIS — I469 Cardiac arrest, cause unspecified: Secondary | ICD-10-CM | POA: Diagnosis not present

## 2017-07-07 DIAGNOSIS — I5032 Chronic diastolic (congestive) heart failure: Secondary | ICD-10-CM | POA: Diagnosis present

## 2017-07-07 DIAGNOSIS — F32A Depression, unspecified: Secondary | ICD-10-CM | POA: Diagnosis present

## 2017-07-07 DIAGNOSIS — R402362 Coma scale, best motor response, obeys commands, at arrival to emergency department: Secondary | ICD-10-CM | POA: Diagnosis present

## 2017-07-07 DIAGNOSIS — I132 Hypertensive heart and chronic kidney disease with heart failure and with stage 5 chronic kidney disease, or end stage renal disease: Secondary | ICD-10-CM | POA: Diagnosis present

## 2017-07-07 DIAGNOSIS — I10 Essential (primary) hypertension: Secondary | ICD-10-CM | POA: Diagnosis present

## 2017-07-07 DIAGNOSIS — D631 Anemia in chronic kidney disease: Secondary | ICD-10-CM | POA: Diagnosis present

## 2017-07-07 DIAGNOSIS — R7303 Prediabetes: Secondary | ICD-10-CM | POA: Diagnosis present

## 2017-07-07 DIAGNOSIS — N186 End stage renal disease: Secondary | ICD-10-CM | POA: Diagnosis present

## 2017-07-07 DIAGNOSIS — R402142 Coma scale, eyes open, spontaneous, at arrival to emergency department: Secondary | ICD-10-CM | POA: Diagnosis present

## 2017-07-07 DIAGNOSIS — I82409 Acute embolism and thrombosis of unspecified deep veins of unspecified lower extremity: Secondary | ICD-10-CM | POA: Diagnosis present

## 2017-07-07 DIAGNOSIS — Z8249 Family history of ischemic heart disease and other diseases of the circulatory system: Secondary | ICD-10-CM

## 2017-07-07 DIAGNOSIS — N2581 Secondary hyperparathyroidism of renal origin: Secondary | ICD-10-CM | POA: Diagnosis present

## 2017-07-07 DIAGNOSIS — Z79899 Other long term (current) drug therapy: Secondary | ICD-10-CM | POA: Diagnosis not present

## 2017-07-07 DIAGNOSIS — K219 Gastro-esophageal reflux disease without esophagitis: Secondary | ICD-10-CM | POA: Diagnosis present

## 2017-07-07 DIAGNOSIS — M1991 Primary osteoarthritis, unspecified site: Secondary | ICD-10-CM | POA: Diagnosis present

## 2017-07-07 DIAGNOSIS — R0789 Other chest pain: Secondary | ICD-10-CM | POA: Diagnosis present

## 2017-07-07 DIAGNOSIS — I222 Subsequent non-ST elevation (NSTEMI) myocardial infarction: Secondary | ICD-10-CM | POA: Diagnosis present

## 2017-07-07 LAB — COMPREHENSIVE METABOLIC PANEL
ALT: 147 U/L — ABNORMAL HIGH (ref 17–63)
ANION GAP: 14 (ref 5–15)
AST: 113 U/L — ABNORMAL HIGH (ref 15–41)
Albumin: 3.7 g/dL (ref 3.5–5.0)
Alkaline Phosphatase: 216 U/L — ABNORMAL HIGH (ref 38–126)
BUN: 20 mg/dL (ref 6–20)
CHLORIDE: 92 mmol/L — AB (ref 101–111)
CO2: 30 mmol/L (ref 22–32)
Calcium: 8.8 mg/dL — ABNORMAL LOW (ref 8.9–10.3)
Creatinine, Ser: 6.87 mg/dL — ABNORMAL HIGH (ref 0.61–1.24)
GFR, EST AFRICAN AMERICAN: 9 mL/min — AB (ref >60)
GFR, EST NON AFRICAN AMERICAN: 8 mL/min — AB (ref >60)
Glucose, Bld: 102 mg/dL — ABNORMAL HIGH (ref 65–99)
Potassium: 4.3 mmol/L (ref 3.5–5.1)
SODIUM: 136 mmol/L (ref 135–145)
Total Bilirubin: 0.6 mg/dL (ref 0.3–1.2)
Total Protein: 6.9 g/dL (ref 6.5–8.1)

## 2017-07-07 LAB — URINALYSIS, ROUTINE W REFLEX MICROSCOPIC
BILIRUBIN URINE: NEGATIVE
Bacteria, UA: NONE SEEN
Glucose, UA: NEGATIVE mg/dL
Hgb urine dipstick: NEGATIVE
Ketones, ur: NEGATIVE mg/dL
LEUKOCYTES UA: NEGATIVE
NITRITE: NEGATIVE
Specific Gravity, Urine: 1.009 (ref 1.005–1.030)
pH: 9 — ABNORMAL HIGH (ref 5.0–8.0)

## 2017-07-07 LAB — CBC WITH DIFFERENTIAL/PLATELET
Basophils Absolute: 0.1 10*3/uL (ref 0.0–0.1)
Basophils Relative: 1 %
EOS ABS: 0.3 10*3/uL (ref 0.0–0.7)
EOS PCT: 4 %
HCT: 26.3 % — ABNORMAL LOW (ref 39.0–52.0)
Hemoglobin: 8.3 g/dL — ABNORMAL LOW (ref 13.0–17.0)
LYMPHS ABS: 2.9 10*3/uL (ref 0.7–4.0)
Lymphocytes Relative: 37 %
MCH: 29.4 pg (ref 26.0–34.0)
MCHC: 31.6 g/dL (ref 30.0–36.0)
MCV: 93.3 fL (ref 78.0–100.0)
MONOS PCT: 14 %
Monocytes Absolute: 1.1 10*3/uL — ABNORMAL HIGH (ref 0.1–1.0)
Neutro Abs: 3.6 10*3/uL (ref 1.7–7.7)
Neutrophils Relative %: 44 %
PLATELETS: 377 10*3/uL (ref 150–400)
RBC: 2.82 MIL/uL — ABNORMAL LOW (ref 4.22–5.81)
RDW: 15.9 % — ABNORMAL HIGH (ref 11.5–15.5)
WBC: 8 10*3/uL (ref 4.0–10.5)

## 2017-07-07 LAB — D-DIMER, QUANTITATIVE (NOT AT ARMC): D DIMER QUANT: 2.69 ug{FEU}/mL — AB (ref 0.00–0.50)

## 2017-07-07 LAB — TROPONIN I
TROPONIN I: 0.03 ng/mL — AB (ref <0.03)
Troponin I: 0.03 ng/mL (ref <0.03)

## 2017-07-07 LAB — BRAIN NATRIURETIC PEPTIDE: B NATRIURETIC PEPTIDE 5: 69.3 pg/mL (ref 0.0–100.0)

## 2017-07-07 LAB — GLUCOSE, CAPILLARY: GLUCOSE-CAPILLARY: 136 mg/dL — AB (ref 65–99)

## 2017-07-07 MED ORDER — SODIUM CHLORIDE 0.9 % IV SOLN
INTRAVENOUS | Status: DC
  Administered 2017-07-07: 09:00:00 via INTRAVENOUS

## 2017-07-07 MED ORDER — ATORVASTATIN CALCIUM 80 MG PO TABS
80.0000 mg | ORAL_TABLET | Freq: Every day | ORAL | Status: DC
  Administered 2017-07-07 – 2017-07-22 (×16): 80 mg via ORAL
  Filled 2017-07-07 (×16): qty 1

## 2017-07-07 MED ORDER — ONDANSETRON HCL 4 MG PO TABS
4.0000 mg | ORAL_TABLET | Freq: Four times a day (QID) | ORAL | Status: DC | PRN
  Administered 2017-07-23: 4 mg via ORAL
  Filled 2017-07-07: qty 1

## 2017-07-07 MED ORDER — TECHNETIUM TO 99M ALBUMIN AGGREGATED
4.2000 | Freq: Once | INTRAVENOUS | Status: AC | PRN
Start: 2017-07-07 — End: 2017-07-07
  Administered 2017-07-07: 4.2 via INTRAVENOUS

## 2017-07-07 MED ORDER — SEVELAMER CARBONATE 800 MG PO TABS
800.0000 mg | ORAL_TABLET | Freq: Three times a day (TID) | ORAL | Status: DC
  Administered 2017-07-07 – 2017-07-17 (×23): 800 mg via ORAL
  Filled 2017-07-07 (×23): qty 1

## 2017-07-07 MED ORDER — ASPIRIN EC 81 MG PO TBEC
81.0000 mg | DELAYED_RELEASE_TABLET | Freq: Every day | ORAL | Status: DC
  Administered 2017-07-07 – 2017-07-23 (×17): 81 mg via ORAL
  Filled 2017-07-07 (×17): qty 1

## 2017-07-07 MED ORDER — HEPARIN SODIUM (PORCINE) 5000 UNIT/ML IJ SOLN: 5000.0000 [IU] | Freq: Three times a day (TID) | INTRAMUSCULAR | Status: DC

## 2017-07-07 MED ORDER — HYDROCODONE-ACETAMINOPHEN 5-325 MG PO TABS
1.0000 | ORAL_TABLET | ORAL | Status: DC | PRN
  Administered 2017-07-09: 2 via ORAL
  Filled 2017-07-07: qty 2

## 2017-07-07 MED ORDER — INSULIN ASPART 100 UNIT/ML ~~LOC~~ SOLN
0.0000 [IU] | Freq: Three times a day (TID) | SUBCUTANEOUS | Status: DC
  Administered 2017-07-08 – 2017-07-10 (×4): 1 [IU] via SUBCUTANEOUS
  Administered 2017-07-11: 2 [IU] via SUBCUTANEOUS
  Administered 2017-07-13: 1 [IU] via SUBCUTANEOUS
  Administered 2017-07-15: 2 [IU] via SUBCUTANEOUS
  Administered 2017-07-16 – 2017-07-19 (×4): 1 [IU] via SUBCUTANEOUS
  Administered 2017-07-20 – 2017-07-21 (×3): 2 [IU] via SUBCUTANEOUS

## 2017-07-07 MED ORDER — PANTOPRAZOLE SODIUM 40 MG PO TBEC
40.0000 mg | DELAYED_RELEASE_TABLET | Freq: Every day | ORAL | Status: DC
  Administered 2017-07-08 – 2017-07-23 (×16): 40 mg via ORAL
  Filled 2017-07-07 (×16): qty 1

## 2017-07-07 MED ORDER — SENNOSIDES-DOCUSATE SODIUM 8.6-50 MG PO TABS: 1.0000 | ORAL_TABLET | Freq: Every evening | ORAL | Status: DC | PRN

## 2017-07-07 MED ORDER — MORPHINE SULFATE (PF) 4 MG/ML IV SOLN: 1.0000 mg | INTRAVENOUS | Status: DC | PRN

## 2017-07-07 MED ORDER — TECHNETIUM TC 99M DIETHYLENETRIAME-PENTAACETIC ACID
31.0000 | Freq: Once | INTRAVENOUS | Status: AC | PRN
  Administered 2017-07-07: 31 via RESPIRATORY_TRACT

## 2017-07-07 MED ORDER — BISACODYL 10 MG RE SUPP: 10.0000 mg | Freq: Every day | RECTAL | Status: DC | PRN

## 2017-07-07 MED ORDER — HEPARIN SODIUM (PORCINE) 5000 UNIT/ML IJ SOLN
5000.0000 [IU] | Freq: Three times a day (TID) | INTRAMUSCULAR | Status: DC
  Administered 2017-07-07 – 2017-07-23 (×40): 5000 [IU] via SUBCUTANEOUS
  Filled 2017-07-07 (×43): qty 1

## 2017-07-07 MED ORDER — METOPROLOL TARTRATE 12.5 MG HALF TABLET
12.5000 mg | ORAL_TABLET | Freq: Two times a day (BID) | ORAL | Status: DC
  Administered 2017-07-07 – 2017-07-23 (×27): 12.5 mg via ORAL
  Filled 2017-07-07 (×28): qty 1

## 2017-07-07 MED ORDER — SERTRALINE HCL 50 MG PO TABS
50.0000 mg | ORAL_TABLET | Freq: Every day | ORAL | Status: DC
  Administered 2017-07-07 – 2017-07-22 (×16): 50 mg via ORAL
  Filled 2017-07-07 (×17): qty 1

## 2017-07-07 MED ORDER — LATANOPROST 0.005 % OP SOLN
1.0000 [drp] | Freq: Every day | OPHTHALMIC | Status: DC
  Administered 2017-07-08 – 2017-07-22 (×14): 1 [drp] via OPHTHALMIC
  Filled 2017-07-07 (×2): qty 2.5

## 2017-07-07 MED ORDER — CLOPIDOGREL BISULFATE 75 MG PO TABS
75.0000 mg | ORAL_TABLET | Freq: Every day | ORAL | Status: DC
  Administered 2017-07-07 – 2017-07-23 (×17): 75 mg via ORAL
  Filled 2017-07-07 (×17): qty 1

## 2017-07-07 MED ORDER — ONDANSETRON HCL 4 MG/2ML IJ SOLN: 4.0000 mg | Freq: Four times a day (QID) | INTRAMUSCULAR | Status: DC | PRN

## 2017-07-07 MED ORDER — ACETAMINOPHEN 325 MG PO TABS: 650.0000 mg | ORAL_TABLET | ORAL | Status: DC | PRN

## 2017-07-07 MED ORDER — GI COCKTAIL ~~LOC~~
30.0000 mL | Freq: Four times a day (QID) | ORAL | Status: DC | PRN
  Administered 2017-07-20: 30 mL via ORAL
  Filled 2017-07-07: qty 30

## 2017-07-07 NOTE — ED Triage Notes (Signed)
Pt presents for evaluation of chest pain. States started this AM. Pt was post cardiac arrest on 4/19, pacemaker placed. Last dialysis yesterday.

## 2017-07-07 NOTE — Progress Notes (Addendum)
2:45pm- CSW received call from Velna Hatchet from [facility] and was informed that she does not have any Medicaid beds at this time. Velna Hatchet expressed that she doesn't know when she will have any Medicaid beds and also suggested that she originally was agreeable to take, however can not for awhile. CSW reached back out to Cindi with [facility] to update her. CSW was informed that Cindi has been speaking with pt and will assist with placement from home with pt if pt is not admitted and is sent back home. CSW offered possible other facilities that may have Medicaid beds to Cindi at this time. Cindi expressed that she is not needing anything from ED CSW at this time and will continue to seek placement for pt from home.   2:27pm- CSW spoke with Cindi and was informed that she has not heard anything form [facility] at this time. CSW was informed that if pt is sent back home then Cindi would continue to work with pt on getting placed from home into a facility. CSW reached back out to admissions at [facility] and left another VM asking that she call CSW back.   1:31pm- CSW went to speak with pt at bedside. Pt not in the room at this time. CSW reached out to Cindi with [facility] to follow up with any possible needs for pt. Left VM.   CSW and RNCM spoke with Cindi [phone] from [facility] and was informed that she is working to see if pt can get another bed with [facility] as needed. CSW did reach out to [facility] to follow up on if they have beds available today. CSW left VM asking that CSW get call back. CSW was informed that Cindi would call CSW if she needed any thing from CSW to aid in this process. CSW will follow for any further needs.    Claude Manges Athleen Feltner, MSW, LCSW-A Emergency Department Clinical Social Worker [phone]

## 2017-07-07 NOTE — Discharge Planning (Signed)
EDCM spoke with [facility] representative to find that they have beds available.  Awaiting call from admissions coordinator.

## 2017-07-07 NOTE — ED Notes (Signed)
Dinner Tray ordered via [facility]

## 2017-07-07 NOTE — Clinical Social Work Note (Signed)
Clinical Social Work Assessment  Patient Details  Name: Richard Barnett MRN: [id number] Date of Birth: 11/08/1955  Date of referral:  07/07/17               Reason for consult:  Facility Placement                Permission sought to share information with:  Other Permission granted to share information::  No  Name::     none given  Agency::  none given  Relationship::   none given   Contact Information:  none given   Housing/Transportation Living arrangements for the past 2 months:  Single Family Home(alone. ) Source of Information:  Patient Patient Interpreter Needed:  None Criminal Activity/Legal Involvement Pertinent to Current Situation/Hospitalization:  No - Comment as needed Significant Relationships:  Neighbor Lives with:  Self Do you feel safe going back to the place where you live?  Yes Need for family participation in patient care:  Yes (Comment)  Care giving concerns:  CSW spoke with pt at bedside. Pt expressed concerns about being home alone and needing more support than pt has. Pt expressed that pt was suppose to go to [facility] a few weeks ago to build back up strength to care for self-however bed was no longer available once pt was ready to discharge. Pt expressed that pt does need more help caring for self.   Social Worker assessment / plan:  CSW spoke with pt at bedside. During this time CSW was informed that pt is from home alone. Per [facility]) she has been working with pt to get pt placed from home and was hoping that [facility] would still be able to take pt-however they are not. CSW spoke with pt and pt confirmed that pt has been speaking with Cindi and that was the plan from what pt had been told. Pt expressed that pt has little to no support to help pt when pt needs things. Pt reported that if it comes down to a point where pt can not do anything then pt would get help from neighbor.   During this assessment pt was lying in bed with lights off  watching tv. Pt was able to answer most questions using "yes or no" responses. Pt expressed verbal understanding of needs as well as plan of care at this time.   Employment status:  Other (Comment)(unknown at this time. ) Insurance information:  [place]icaid [place] PT Recommendations:  Not assessed at this time Information / Referral to community resources:  Skilled Nursing Facility(spoke with pt at bedside about [facility] adn updated him on status of that placement at this time. )  Patient/Family's Response to care:  Pt's response to care is appropriate for diagnosis given at this time. Pt verbalized understanding and willing to take any further services that pt can get.   Patient/Family's Understanding of and Emotional Response to Diagnosis, Current Treatment, and Prognosis: No further questions or concerns have been presented to CSW at this time. Pt appeared to be understanding and agreeable with plan of care at this time. Pt's emotional responseto care was a little sad as pt expressed that pt is needing to get somewhere where pt can get more help for some time. CSW verbalized understanding of this and informed pt that CSW has spoken with Cindi and Cindi had expressed that she would continue to seek placement for pt at this time from the home.   Emotional Assessment Appearance:  Appears stated age Attitude/Demeanor/Rapport:  Engaged Affect (typically observed):  Appropriate, Pleasant Orientation:  Oriented to Self, Oriented to Place, Oriented to  Time, Oriented to Situation Alcohol / Substance use:  Not Applicable Psych involvement (Current and /or in the community):  No (Comment)(not at this time. )  Discharge Needs  Concerns to be addressed:  Care Coordination, Lack of Support, Basic Needs Readmission within the last 30 days:  Yes Current discharge risk:  Dependent with Mobility, Lack of support system Barriers to Discharge:  No Barriers Identified   Robb Matar, LCSWA 07/07/2017,  3:01 PM

## 2017-07-07 NOTE — ED Provider Notes (Signed)
[facility] EMERGENCY DEPARTMENT Provider Note   CSN: [id number] Arrival date & time: 07/07/17  0744     History   Chief Complaint Chief Complaint  Patient presents with  . Chest Pain    HPI Richard Barnett is a 62 y.o. Richard Barnett.  62 year old Richard Barnett presents with sudden onset of chest pain with associated diaphoresis that began this morning.  He is status post cardiac arrest several weeks ago and does have a defibrillator in place.  He denies any syncope or near syncope.  No associated shortness of breath.  Characterizes the pain is dull and worse with movement at times.  Denies any leg pain or swelling.  Has been dealing with this for some time but today was the first day was associated diaphoresis.  He was concerned that his pacemaker might of had a malfunction but denies any palpitations.  Continues to note slight discomfort which is greatly improved without therapy.  Called EMS was transported here.     Past Medical History:  Diagnosis Date  . Arthritis    "right leg" (08/22/2014)  . CAD (coronary artery disease)    a. CAD s/p 2 stent placement in [place] 2016. b. Neg nuc 05/2014 & 01/2016.  Marland Kitchen CKD (chronic kidney disease), stage IV (HCC)    Richard Barnett 08/22/2014  . Daily headache   . Depression    "I always get that" (08/22/2014)  . Diabetes mellitus (HCC)   . DVT (deep venous thrombosis) (HCC) ?   RLE  . GERD (gastroesophageal reflux disease)   . Hypercholesterolemia   . Hypertension     Patient Active Problem List   Diagnosis Date Noted  . Cardiac arrest (HCC) 06/24/2017  . Adjustment disorder with mixed disturbance of emotions and conduct   . Major depressive disorder, recurrent episode, severe (HCC) 05/14/2017  . GERD (gastroesophageal reflux disease) 05/13/2017  . ESRD on dialysis (HCC) 05/13/2017  . Suicidal ideation 05/13/2017  . NSTEMI (non-ST elevated myocardial infarction) (HCC) 01/27/2017  . Diabetes mellitus (HCC)   . Anemia of chronic renal  failure   . Secondary hyperparathyroidism of renal origin (HCC)   . Proteinuria   . Arthritis   . Depression   . Deep vein thrombosis (HCC)   . Myocardial infarction (HCC)   . Heart failure, unspecified (HCC)   . Heart murmur   . Prediabetes 02/22/2015  . Chronic kidney disease, stage IV (severe) (HCC)   . Hyperlipidemia 05/22/2014  . Chest pain 05/21/2014  . Hypokalemia 05/21/2014  . CAD (coronary artery disease) 05/21/2014  . Hypertension 05/21/2014    Past Surgical History:  Procedure Laterality Date  . AV FISTULA PLACEMENT Left 10/01/2016   Procedure: INSERTION OF 4-7MM X 45CM ARTERIOVENOUS (AV) GORE-TEX GRAFT ARM;  Surgeon: Chuck Hint, MD;  Location: [facility] OR;  Service: Vascular;  Laterality: Left;  . CORONARY ANGIOPLASTY WITH STENT PLACEMENT     "1 + 1"  . DRAINAGE AND CLOSURE OF LYMPHOCELE Left 10/29/2016   Procedure: EVACUATION OF LYMPHOCELE LEFT ARM ARTERIOVENOUS GRAFT;  Surgeon: Chuck Hint, MD;  Location: [facility] OR;  Service: Vascular;  Laterality: Left;  . IR AV DIALY SHUNT INTRO NEEDLE/INTRACATH INITIAL W/PTA/IMG LEFT  05/17/2017  . IR DIALY SHUNT INTRO NEEDLE/INTRACATH INITIAL W/IMG LEFT Left 05/16/2017  . IR PTA ADDL CENTRAL DIALYSIS SEG THRU DIALY CIRCUIT LEFT Left 05/17/2017  . IR US GUIDE VASC ACCESS LEFT  05/17/2017  . LEFT HEART CATH AND CORONARY ANGIOGRAPHY N/A 01/14/2017   Procedure: LEFT HEART  CATH AND CORONARY ANGIOGRAPHY;  Surgeon: [place], Peter M, MD;  Location: [facility] INVASIVE CV LAB;  Service: Cardiovascular;  Laterality: N/A;  . LEFT HEART CATH AND CORONARY ANGIOGRAPHY N/A 06/28/2017   Procedure: LEFT HEART CATH AND CORONARY ANGIOGRAPHY;  Surgeon: Lyn Records, MD;  Location: [facility] INVASIVE CV LAB;  Service: Cardiovascular;  Laterality: N/A;  . SUBQ ICD IMPLANT N/A 06/29/2017   Procedure: SUBQ ICD IMPLANT;  Surgeon: Marinus Maw, MD;  Location: [facility] INVASIVE CV LAB;  Service: Cardiovascular;  Laterality: N/A;        Home Medications    Prior  to Admission medications   Medication Sig Start Date End Date Taking? Authorizing Provider  aspirin 81 MG EC tablet Take 1 tablet (81 mg total) by mouth daily. Swallow whole. 07/02/17   Noralee Stain, DO  atorvastatin (LIPITOR) 80 MG tablet Take 1 tablet (80 mg total) by mouth daily at 6 PM. 01/29/17   Chundi, Vahini, MD  Brinzolamide-Brimonidine [facility]) 1-0.2 % SUSP Place 1 drop into both eyes 2 (two) times daily.    [provider]  clopidogrel (PLAVIX) 75 MG tablet Take 1 tablet (75 mg total) by mouth daily. 01/30/17   Chundi, Sherlyn Lees, MD  latanoprost (XALATAN) 0.005 % ophthalmic solution Place 1 drop into both eyes daily.    [provider]  metoprolol tartrate (LOPRESSOR) 25 MG tablet Take 0.5 tablets (12.5 mg total) by mouth 2 (two) times daily. 07/02/17   Noralee Stain, DO  nitroGLYCERIN (NITROSTAT) 0.4 MG SL tablet DISSOLVE 1 TABLET UNDER THE TONGUE AS NEEDED FOR CHEST PAIN. REPEAT AS NEEDED EVERY 5 MINUTES UP TO A TOTAL OF 3 DOSES Patient taking differently: DISSOLVE 1 TABLET (0.4 MG) UNDER THE TONGUE AS NEEDED FOR CHEST PAIN/REPEAT AS NEEDED EVERY 5 MINUTES UP TO A TOTAL OF 3 DOSES 12/15/15   Henrietta Hoover, NP  oxyCODONE (OXY IR/ROXICODONE) 5 MG immediate release tablet Take 1 tablet (5 mg total) by mouth every 6 (six) hours as needed for up to 5 days for severe pain. 07/02/17 07/07/17  Noralee Stain, DO  RENAGEL 800 MG tablet Take 1,600 mg by mouth 3 (three) times daily with meals. 06/23/17   [provider]  sertraline (ZOLOFT) 50 MG tablet Take 1 tablet (50 mg total) by mouth at bedtime. 05/21/17   Penny Pia, MD    Family History Family History  Problem Relation Age of Onset  . Hypertension Mother   . Kidney disease Mother   . Heart disease Mother   . Heart disease Father   . Hypertension Father     Social History Social History   Tobacco Use  . Smoking status: Former Smoker    Packs/day: 0.00    Years: 40.00    Pack years: 0.00     Types: Cigarettes    Last attempt to quit: 08/29/2010    Years since quitting: 6.8  . Smokeless tobacco: Never Used  . Tobacco comment: "quit smoking cigarettes in  2011" 10/27/16  Substance Use Topics  . Alcohol use: No    Comment: 10/27/16 "stopped in ~ 2011"  . Drug use: No    Types: "Crack" cocaine, Other-see comments    Comment: 10/27/16 "stopped in ~ 2011; all types of drugs""     Allergies   Penicillins   Review of Systems Review of Systems  All other systems reviewed and are negative.    Physical Exam Updated Vital Signs BP 120/75 (BP Location: Right Arm)   Pulse 73  Temp 97.7 F (36.5 C) (Oral)   Resp 20   SpO2 100%   Physical Exam  Constitutional: He is oriented to person, place, and time. He appears well-developed and well-nourished.  Non-toxic appearance. No distress.  HENT:  Head: Normocephalic and atraumatic.  Eyes: Pupils are equal, round, and reactive to light. Conjunctivae, EOM and lids are normal.  Neck: Normal range of motion. Neck supple. No tracheal deviation present. No thyroid mass present.  Cardiovascular: Normal rate, regular rhythm and normal heart sounds. Exam reveals no gallop.  No murmur heard. Pulmonary/Chest: Effort normal and breath sounds normal. No stridor. No respiratory distress. He has no decreased breath sounds. He has no wheezes. He has no rhonchi. He has no rales.  Abdominal: Soft. Normal appearance and bowel sounds are normal. He exhibits no distension. There is no tenderness. There is no rebound and no CVA tenderness.  Musculoskeletal: Normal range of motion. He exhibits no edema or tenderness.  Neurological: He is alert and oriented to person, place, and time. He has normal strength. No cranial nerve deficit or sensory deficit. GCS eye subscore is 4. GCS verbal subscore is 5. GCS motor subscore is 6.  Skin: Skin is warm and dry. No abrasion and no rash noted.  Psychiatric: He has a normal mood and affect. His speech is normal and  behavior is normal.  Nursing note and vitals reviewed.    ED Treatments / Results  Labs (all labs ordered are listed, but only abnormal results are displayed) Labs Reviewed  CBC WITH DIFFERENTIAL/PLATELET  COMPREHENSIVE METABOLIC PANEL  TROPONIN I    EKG EKG Interpretation  Date/Time:  Thursday Jul 07 2017 07:56:22 EDT Ventricular Rate:  73 PR Interval:    QRS Duration: 83 QT Interval:  411 QTC Calculation: 453 R Axis:   18 Text Interpretation:  Sinus rhythm Probable left atrial enlargement Abnormal R-wave progression, early transition No significant change since last tracing Confirmed by Lorre Nick ([id number]) on 07/07/2017 2:58:27 PM   Radiology No results found.  Procedures Procedures (including critical care time)  Medications Ordered in ED Medications  0.9 %  sodium chloride infusion (has no administration in time range)     Initial Impression / Assessment and Plan / ED Course  I have reviewed the triage vital signs and the nursing notes.  Pertinent labs & imaging results that were available during my care of the patient were reviewed by me and considered in my medical decision making (see chart for details).     Patient is status post cardiac arrest several weeks ago.  Concern for possible pulmonary embolism and VQ scan was negative for PE.  Troponin is slightly elevated but trending down from his prior studies.  His ICD was interrogated and is functioning properly.  Will consult nephrology for dialysis and I spoke with the hospitalist will admit the patient  Final Clinical Impressions(s) / ED Diagnoses   Final diagnoses:  SOB (shortness of breath)    ED Discharge Orders    None       Lorre Nick, MD 07/07/17 1458

## 2017-07-07 NOTE — H&P (Signed)
History and Physical    Richard Barnett UJW:[id number] DOB: [birth year] DOA: 07/07/2017   PCP: Patient, No Pcp Per   Patient coming from:  Home    Chief Complaint: Chest pain   HPI: Richard Barnett is a 62 y.o. Richard Barnett with with a history of CAD status post stenting in November 2018, and a recent admission for cardiac arrest, with admission from 06/24/2017 through 07/04/2017, status post cardiac catheterization on 06/28/2017, showing diffuse disease in the LAD with 40 to 50% narrowing, subsequently placing an ICD on 06/29/2017, and discharged in stable condition.  At the time, skilled nursing facility was recommended, but patient chose to be discharged to home.  Since his discharge, the patient has been on and off experiencing substernal chest pain.  However, today, he reports sudden onset of severe chest pain, with associated diaphoresis.  This began this morning.  He denies any syncope or presyncope.  He denies any palpitations.  He denies any associated shortness of breath, or cough.  He denies any pleuritic chest pain.  However, the pain is characterized as dull, worse with movements.  In fact, the patient reports not wanting to move, due to the pain which appears to be reproducible.  He was concerned at the time, that his pacemaker was malfunctioning, but this was clear after interrogation.  The patient denies any nausea or vomiting, denies any abdominal pain.  The patient still makes some urine, denies any dysuria or gross hematuria.  He denies any lower extremity swelling or calf pain.  The patient did not take anymedications to treat this pain.  Time of evaluation, he describes it as 10 out of 10.  Compliant with his medications, denies tobacco, alcohol or recreational drug use.   ED Course:  BP (!) 151/83   Pulse 74   Temp 97.7 F (36.5 C) (Oral)   Resp 18   SpO2 100%   EKG shows sinus rhythm, LA ED, abnormal R Wave, no significant change since the last tracing, QTc 453. TN 0.03, and down from  prior studies VQ scan was negative for PE. As above, his ICD was interrogated, and is functioning properly. Last 2D echo was on 420 22,019, EF 60 to 65%, normal systolic, 81 diastolic Nephrology has been informed of the patient admission, last dialysis was yesterday, and his current potassium is 4.3.   Review of Systems:  As per HPI otherwise all other systems reviewed and are negative  Past Medical History:  Diagnosis Date  . Arthritis    "right leg" (08/22/2014)  . CAD (coronary artery disease)    a. CAD s/p 2 stent placement in [place] 2016. b. Neg nuc 05/2014 & 01/2016.  Marland Kitchen CKD (chronic kidney disease), stage IV (HCC)    [name] 08/22/2014  . Daily headache   . Depression    "I always get that" (08/22/2014)  . Diabetes mellitus (HCC)   . DVT (deep venous thrombosis) (HCC) ?   RLE  . GERD (gastroesophageal reflux disease)   . Hypercholesterolemia   . Hypertension     Past Surgical History:  Procedure Laterality Date  . AV FISTULA PLACEMENT Left 10/01/2016   Procedure: INSERTION OF 4-7MM X 45CM ARTERIOVENOUS (AV) GORE-TEX GRAFT ARM;  Surgeon: Chuck Hint, MD;  Location: [facility] OR;  Service: Vascular;  Laterality: Left;  . CORONARY ANGIOPLASTY WITH STENT PLACEMENT     "1 + 1"  . DRAINAGE AND CLOSURE OF LYMPHOCELE Left 10/29/2016   Procedure: EVACUATION OF LYMPHOCELE LEFT ARM ARTERIOVENOUS GRAFT;  Surgeon: Chuck Hint, MD;  Location: [facility] OR;  Service: Vascular;  Laterality: Left;  . IR AV DIALY SHUNT INTRO NEEDLE/INTRACATH INITIAL W/PTA/IMG LEFT  05/17/2017  . IR DIALY SHUNT INTRO NEEDLE/INTRACATH INITIAL W/IMG LEFT Left 05/16/2017  . IR PTA ADDL CENTRAL DIALYSIS SEG THRU DIALY CIRCUIT LEFT Left 05/17/2017  . IR US GUIDE VASC ACCESS LEFT  05/17/2017  . LEFT HEART CATH AND CORONARY ANGIOGRAPHY N/A 01/14/2017   Procedure: LEFT HEART CATH AND CORONARY ANGIOGRAPHY;  Surgeon: [place], Peter M, MD;  Location: [facility] INVASIVE CV LAB;  Service: Cardiovascular;  Laterality: N/A;  .  LEFT HEART CATH AND CORONARY ANGIOGRAPHY N/A 06/28/2017   Procedure: LEFT HEART CATH AND CORONARY ANGIOGRAPHY;  Surgeon: Lyn Records, MD;  Location: [facility] INVASIVE CV LAB;  Service: Cardiovascular;  Laterality: N/A;  . SUBQ ICD IMPLANT N/A 06/29/2017   Procedure: SUBQ ICD IMPLANT;  Surgeon: Marinus Maw, MD;  Location: [facility] INVASIVE CV LAB;  Service: Cardiovascular;  Laterality: N/A;    Social History Social History   Socioeconomic History  . Marital status: Divorced    Spouse name: Not on file  . Number of children: Not on file  . Years of education: Not on file  . Highest education level: Not on file  Occupational History  . Not on file  Social Needs  . Financial resource strain: Not on file  . Food insecurity:    Worry: Not on file    Inability: Not on file  . Transportation needs:    Medical: Not on file    Non-medical: Not on file  Tobacco Use  . Smoking status: Former Smoker    Packs/day: 0.00    Years: 40.00    Pack years: 0.00    Types: Cigarettes    Last attempt to quit: 08/29/2010    Years since quitting: 6.8  . Smokeless tobacco: Never Used  . Tobacco comment: "quit smoking cigarettes in  2011" 10/27/16  Substance and Sexual Activity  . Alcohol use: No    Comment: 10/27/16 "stopped in ~ 2011"  . Drug use: No    Types: "Crack" cocaine, Other-see comments    Comment: 10/27/16 "stopped in ~ 2011; all types of drugs""  . Sexual activity: Never  Lifestyle  . Physical activity:    Days per week: Not on file    Minutes per session: Not on file  . Stress: Not on file  Relationships  . Social connections:    Talks on phone: Not on file    Gets together: Not on file    Attends religious service: Not on file    Active member of club or organization: Not on file    Attends meetings of clubs or organizations: Not on file    Relationship status: Not on file  . Intimate partner violence:    Fear of current or ex partner: Not on file    Emotionally abused: Not on file      Physically abused: Not on file    Forced sexual activity: Not on file  Other Topics Concern  . Not on file  Social History Narrative  . Not on file     Allergies  Allergen Reactions  . Penicillins Anaphylaxis, Swelling and Rash    ANGIOEDEMA/"SWELLING OF ENTIRE BODY" Has patient had a PCN reaction causing immediate rash, facial/tongue/throat swelling, SOB or lightheadedness with hypotension: Yes Has patient had a PCN reaction causing severe rash involving mucus membranes or skin necrosis: No Has patient had a  PCN reaction that required hospitalization: No Has patient had a PCN reaction occurring within the last 10 years: No If all of the above answers are "NO", then may proceed with Cephalosporin use.     Family History  Problem Relation Age of Onset  . Hypertension Mother   . Kidney disease Mother   . Heart disease Mother   . Heart disease Father   . Hypertension Father       Prior to Admission medications   Medication Sig Start Date End Date Taking? Authorizing Provider  atorvastatin (LIPITOR) 80 MG tablet Take 1 tablet (80 mg total) by mouth daily at 6 PM. 01/29/17  Yes Chundi, Vahini, MD  Brinzolamide-Brimonidine [facility]) 1-0.2 % SUSP Place 1 drop into both eyes 2 (two) times daily.   Yes [provider]  clopidogrel (PLAVIX) 75 MG tablet Take 1 tablet (75 mg total) by mouth daily. 01/30/17  Yes Chundi, Vahini, MD  latanoprost (XALATAN) 0.005 % ophthalmic solution Place 1 drop into both eyes daily.   Yes [provider]  metoprolol tartrate (LOPRESSOR) 25 MG tablet Take 0.5 tablets (12.5 mg total) by mouth 2 (two) times daily. 07/02/17  Yes Noralee Stain, DO  nitroGLYCERIN (NITROSTAT) 0.4 MG SL tablet DISSOLVE 1 TABLET UNDER THE TONGUE AS NEEDED FOR CHEST PAIN. REPEAT AS NEEDED EVERY 5 MINUTES UP TO A TOTAL OF 3 DOSES Patient taking differently: DISSOLVE 1 TABLET (0.4 MG) UNDER THE TONGUE AS NEEDED FOR CHEST PAIN/REPEAT AS NEEDED EVERY 5 MINUTES UP  TO A TOTAL OF 3 DOSES 12/15/15  Yes Henrietta Hoover, NP  oxyCODONE (OXY IR/ROXICODONE) 5 MG immediate release tablet Take 1 tablet (5 mg total) by mouth every 6 (six) hours as needed for up to 5 days for severe pain. 07/02/17 07/07/17 Yes Noralee Stain, DO  sertraline (ZOLOFT) 50 MG tablet Take 1 tablet (50 mg total) by mouth at bedtime. 05/21/17  Yes Penny Pia, MD  aspirin 81 MG EC tablet Take 1 tablet (81 mg total) by mouth daily. Swallow whole. Patient not taking: Reported on 07/07/2017 07/02/17   Noralee Stain, DO  RENAGEL 800 MG tablet Take 1,600 mg by mouth 3 (three) times daily with meals. 06/23/17   [provider]    Physical Exam:  Vitals:   07/07/17 1130 07/07/17 1245 07/07/17 1345 07/07/17 1450  BP: 113/81   (!) 151/83  Pulse: 77 76 73 74  Resp: 17 19 12 18   Temp:      TempSrc:      SpO2: 99% 100% 100% 100%   Constitutional: NAD, very anxious, appears very uncomfortable  eyes: PERRL, lids and conjunctivae normal.  Sclera is muddy ENMT: Mucous membranes are moist, without exudate or lesions  Neck: normal, supple, no masses, no thyromegaly Respiratory: clear to auscultation bilaterally, no wheezing, no crackles. Normal respiratory effort  Cardiovascular: Regular rate and rhythm, possible soft 1 out of 6 murmur, no rubs or gallops. No extremity edema. 2+ pedal pulses. No carotid bruits.  Abdomen: Soft, non tender, No hepatosplenomegaly. Bowel sounds positive.  Musculoskeletal: no clubbing / cyanosis. Moves all extremities.  Pain reproducible on the sternal and left costal area, in a patient with recent arrest, status post chest compressions  skin: no jaundice, No lesions.  There is a ICD placed on the chest, somewhat tender to palpation, but there is no erythema seen in the area. Neurologic: Sensation intact  Strength equal in all extremities and Psychiatric:   Alert and oriented x 3. Normal mood.  Labs on Admission: I have personally reviewed following labs and  imaging studies  CBC: Recent Labs  Lab 07/01/17 1211 07/02/17 0502 07/03/17 0408 07/04/17 0614 07/07/17 0808  WBC 7.4 7.7 6.2 7.0 8.0  NEUTROABS  --   --   --   --  3.6  HGB 9.3* 9.6* 8.5* 8.4* 8.3*  HCT 29.9* 30.6* 27.3* 26.8* 26.3*  MCV 92.3 94.2 93.5 92.7 93.3  PLT 294 326 327 345 377    Basic Metabolic Panel: Recent Labs  Lab 07/01/17 0437 07/02/17 0502 07/03/17 0408 07/04/17 0614 07/07/17 0808  NA 132* 134* 134* 136 136  K 4.2 4.4 4.4 4.4 4.3  CL 93* 94* 97* 100* 92*  CO2 26 29 26 26 30   GLUCOSE 108* 102* 106* 92 102*  BUN 25* 23* 32* 38* 20  CREATININE 7.77* 7.04* 8.65* 9.46* 6.87*  CALCIUM 8.6* 9.2 8.8* 8.9 8.8*  MG 2.2 2.2 2.3 2.3  --     GFR: Estimated Creatinine Clearance: 12.4 mL/min (A) (by C-G formula based on SCr of 6.87 mg/dL (H)).  Liver Function Tests: Recent Labs  Lab 07/07/17 0808  AST 113*  ALT 147*  ALKPHOS 216*  BILITOT 0.6  PROT 6.9  ALBUMIN 3.7   No results for input(s): LIPASE, AMYLASE in the last 168 hours. No results for input(s): AMMONIA in the last 168 hours.  Coagulation Profile: No results for input(s): INR, PROTIME in the last 168 hours.  Cardiac Enzymes: Recent Labs  Lab 07/07/17 0808  TROPONINI 0.03*    BNP (last 3 results) No results for input(s): PROBNP in the last 8760 hours.  HbA1C: No results for input(s): HGBA1C in the last 72 hours.  CBG: Recent Labs  Lab 07/03/17 0749 07/03/17 1114 07/03/17 1710 07/03/17 2113 07/04/17 1234  GLUCAP 108* 139* 108* 112* 121*    Lipid Profile: No results for input(s): CHOL, HDL, LDLCALC, TRIG, CHOLHDL, LDLDIRECT in the last 72 hours.  Thyroid Function Tests: No results for input(s): TSH, T4TOTAL, FREET4, T3FREE, THYROIDAB in the last 72 hours.  Anemia Panel: No results for input(s): VITAMINB12, FOLATE, FERRITIN, TIBC, IRON, RETICCTPCT in the last 72 hours.  Urine analysis:    Component Value Date/Time   COLORURINE YELLOW 06/26/2017 0658   APPEARANCEUR  CLEAR 06/26/2017 0658   LABSPEC 1.007 06/26/2017 0658   PHURINE 8.0 06/26/2017 0658   GLUCOSEU NEGATIVE 06/26/2017 0658   HGBUR SMALL (A) 06/26/2017 0658   BILIRUBINUR NEGATIVE 06/26/2017 0658   KETONESUR NEGATIVE 06/26/2017 0658   PROTEINUR >=300 (A) 06/26/2017 0658   UROBILINOGEN 0.2 02/21/2015 0955   NITRITE NEGATIVE 06/26/2017 0658   LEUKOCYTESUR NEGATIVE 06/26/2017 0658    Sepsis Labs: @LABRCNTIP (procalcitonin:4,lacticidven:4) ) Recent Results (from the past 240 hour(s))  Surgical PCR screen     Status: None   Collection Time: 06/29/17 12:14 AM  Result Value Ref Range Status   MRSA, PCR NEGATIVE NEGATIVE Final   Staphylococcus aureus NEGATIVE NEGATIVE Final    Comment: (NOTE) The Xpert SA Assay (FDA approved for NASAL specimens in patients 77 years of age and older), is one component of a comprehensive surveillance program. It is not intended to diagnose infection nor to guide or monitor treatment. Performed at [facility], [address], [place], [place] [address]      Radiological Exams on Admission: Dg Chest 2 View  Result Date: 07/07/2017 CLINICAL DATA:  Mid chest pain, started this AM. Pt was post cardiac arrest on 4/19, pacemaker placed. EXAM: CHEST - 2 VIEW  COMPARISON:  06/30/2017 FINDINGS: Lateral view degraded by patient arm position. Numerous leads and wires project over the chest. Midline trachea. Mild cardiomegaly. No pleural effusion or pneumothorax. No congestive failure. Clear lungs. IMPRESSION: Mild cardiomegaly, without acute disease. Electronically Signed   By: Jeronimo Greaves M.D.   On: 07/07/2017 08:58   Nm Pulmonary Vent And Perf (v/q Scan)  Result Date: 07/07/2017 CLINICAL DATA:  PE suspected, high pretest probability. Chest and back pain. EXAM: NUCLEAR MEDICINE VENTILATION - PERFUSION LUNG SCAN TECHNIQUE: Ventilation images were obtained in multiple projections using inhaled aerosol Tc-1m DTPA. Perfusion images were obtained in multiple  projections after intravenous injection of Tc-1m-MAA. RADIOPHARMACEUTICALS:  31 mCi of Tc-6m DTPA aerosol inhalation and 4.2 mCi Tc35m-MAA IV COMPARISON:  Chest radiograph from earlier today. FINDINGS: Matched defect on the left lateral image correlates with a defibrillator battery pack on chest x-ray. No true parenchymal perfusion or ventilation defect is seen. IMPRESSION: Negative for pulmonary embolism. Electronically Signed   By: Marnee Spring M.D.   On: 07/07/2017 14:32    EKG: Independently reviewed. EKG shows sinus rhythm, LA ED, abnormal R Wave, no significant change since the last tracing, QTc 453.  Assessment/Plan Principal Problem:   Chest pain Active Problems:   CAD (coronary artery disease)   Hypertension   Hyperlipidemia   Prediabetes   Anemia of chronic renal failure   Secondary hyperparathyroidism of renal origin (HCC)   Arthritis   Depression   Deep vein thrombosis (HCC)   Diabetes mellitus (HCC)   NSTEMI (non-ST elevated myocardial infarction) (HCC)   GERD (gastroesophageal reflux disease)   ESRD on dialysis (HCC)   Major depressive disorder, recurrent episode, severe (HCC)   Adjustment disorder with mixed disturbance of emotions and conduct   Cardiac arrest (HCC)    Chest pain syndrome/known CAD, cardiac versus musculoskeletal.  Patient is status post cardiac arrest, status post NSTEMI heart cath on 06/28/2017, showing diffusely diseased LAD with 40 to 50% narrowing.  Patient is status post ICD on 06/29/2017, he is scheduled to be seen by EP as an outpatient.  Troponin is negative.  EKG without acute changes.  Chest x-ray is unrevealing.  VQ scan is negative for PE.  D-dimer was 2.69.  ICD interrogation shows the defibrillator to be working properly.  Last 2D echo on 06/27/2017 shows EF 60 to 65% with normal systolic, grade 1 diastolic.  Admit to Telemetry/vision Chest pain order set Cycle troponin EKG in am continue ASA, O2 and NTG as needed GI  cocktail Continue aspirin, Plavix, Lipitor Lopressor No 2D echo is going to be performed, as the recent one was performed his prior hospitalization on 06/27/2017 Musculoskeletal pain control  If chest pain worsens, or any other troponins or EKG shows changes, will obtain cardiology consultation. PT/OT     Chronic diastolic heart failure, last 2D echo on 06/27/2016 showing EF 60 to 65%, with normal systolic, grade 1 diastolic. Monitor EI's and O's, weigh daily.  Weight currently is 216 pounds, stable. Continue beta-blockers.  Diabetes type 2, the patient is not on insulin at this time. Glucose 121 Obtain hemoglobin A1c Sliding scale insulin  ESRD on HD MWF but last HD was yesterday Wed 5/1  Cr 6.87, K 4.3.  Patient still makes urine although small amount Nephrology involved to inform about patient's admission in view of need for HD while IP  Renal Diet. Continue renal vitamins  Other plans as per Nephrology Check BMET in am  Hyperlipidemia Continue home statins   Hypertension  BP  148/83   Pulse  107    Continue home anti-hypertensive medications    Depression Continue home ZOloft     Remote history of DVT, recent VQ scan is negative for PE.  The patient denies any lower extremity pain, or swelling.  No indication for oral anticoagulation. Continue aspirin and Plavix. Ventricular the patient is on heparin subcu while in hospital.  GERD  Continue PPI GI cocktail prn  Anemia of chronic disease Hemoglobin on admission 8.3 at baseline . No bleeding issues noted  Repeat CBC in am  No transfusion is indicated at this time   DVT prophylaxis:  Hep sq  Code Status:    Full  Family Communication:  Discussed with patient Disposition Plan:  Patient could benefit of skilled nursing facility discharge.  Case managers and social work are to consult. Consults called:    Social work and Secretary/administrator.  Nephrology for dialysis Admission status: Inpatient  telemetry   Marlowe Kays, [place] [facility]   Amion text  [id number]   07/07/2017, 3:01 PM

## 2017-07-08 DIAGNOSIS — R071 Chest pain on breathing: Secondary | ICD-10-CM

## 2017-07-08 DIAGNOSIS — I2510 Atherosclerotic heart disease of native coronary artery without angina pectoris: Secondary | ICD-10-CM

## 2017-07-08 DIAGNOSIS — F332 Major depressive disorder, recurrent severe without psychotic features: Secondary | ICD-10-CM

## 2017-07-08 DIAGNOSIS — F4325 Adjustment disorder with mixed disturbance of emotions and conduct: Secondary | ICD-10-CM

## 2017-07-08 LAB — CBC
HCT: 28.2 % — ABNORMAL LOW (ref 39.0–52.0)
Hemoglobin: 8.7 g/dL — ABNORMAL LOW (ref 13.0–17.0)
MCH: 28.9 pg (ref 26.0–34.0)
MCHC: 30.9 g/dL (ref 30.0–36.0)
MCV: 93.7 fL (ref 78.0–100.0)
Platelets: 424 10*3/uL — ABNORMAL HIGH (ref 150–400)
RBC: 3.01 MIL/uL — AB (ref 4.22–5.81)
RDW: 16 % — AB (ref 11.5–15.5)
WBC: 7.1 10*3/uL (ref 4.0–10.5)

## 2017-07-08 LAB — BASIC METABOLIC PANEL
Anion gap: 14 (ref 5–15)
BUN: 24 mg/dL — AB (ref 6–20)
CO2: 30 mmol/L (ref 22–32)
CREATININE: 8.03 mg/dL — AB (ref 0.61–1.24)
Calcium: 9.1 mg/dL (ref 8.9–10.3)
Chloride: 95 mmol/L — ABNORMAL LOW (ref 101–111)
GFR calc non Af Amer: 6 mL/min — ABNORMAL LOW (ref >60)
GFR, EST AFRICAN AMERICAN: 7 mL/min — AB (ref >60)
Glucose, Bld: 112 mg/dL — ABNORMAL HIGH (ref 65–99)
Potassium: 4.2 mmol/L (ref 3.5–5.1)
SODIUM: 139 mmol/L (ref 135–145)

## 2017-07-08 LAB — HEMOGLOBIN A1C
Hgb A1c MFr Bld: 5 % (ref 4.8–5.6)
Mean Plasma Glucose: 96.8 mg/dL

## 2017-07-08 LAB — TROPONIN I
Troponin I: <0.03 ng/mL (ref <0.03)
Troponin I: <0.03 ng/mL (ref <0.03)

## 2017-07-08 LAB — GLUCOSE, CAPILLARY
GLUCOSE-CAPILLARY: 147 mg/dL — AB (ref 65–99)
Glucose-Capillary: 189 mg/dL — ABNORMAL HIGH (ref 65–99)

## 2017-07-08 MED ORDER — SODIUM CHLORIDE 0.9 % IV SOLN: 100.0000 mL | INTRAVENOUS | Status: DC | PRN

## 2017-07-08 MED ORDER — HEPARIN SODIUM (PORCINE) 1000 UNIT/ML DIALYSIS
1000.0000 [IU] | INTRAMUSCULAR | Status: DC | PRN
  Filled 2017-07-08: qty 1

## 2017-07-08 MED ORDER — HEPARIN SODIUM (PORCINE) 1000 UNIT/ML DIALYSIS
20.0000 [IU]/kg | INTRAMUSCULAR | Status: DC | PRN
  Filled 2017-07-08: qty 2

## 2017-07-08 MED ORDER — LIDOCAINE HCL (PF) 1 % IJ SOLN: 5.0000 mL | INTRAMUSCULAR | Status: DC | PRN

## 2017-07-08 MED ORDER — CALCITRIOL 0.5 MCG PO CAPS
0.5000 ug | ORAL_CAPSULE | ORAL | Status: DC
  Administered 2017-07-08 – 2017-07-20 (×7): 0.5 ug via ORAL
  Filled 2017-07-08 (×3): qty 1

## 2017-07-08 MED ORDER — CALCITRIOL 0.5 MCG PO CAPS
ORAL_CAPSULE | ORAL | Status: AC
  Administered 2017-07-08: 0.5 ug via ORAL
  Filled 2017-07-08: qty 1

## 2017-07-08 MED ORDER — PENTAFLUOROPROP-TETRAFLUOROETH EX AERO: 1.0000 "application " | INHALATION_SPRAY | CUTANEOUS | Status: DC | PRN

## 2017-07-08 MED ORDER — LIDOCAINE-PRILOCAINE 2.5-2.5 % EX CREA
1.0000 "application " | TOPICAL_CREAM | CUTANEOUS | Status: DC | PRN
  Filled 2017-07-08: qty 5

## 2017-07-08 NOTE — Progress Notes (Signed)
PT Cancellation Note  Patient Details Name: Richard Barnett MRN: [id number] DOB: Nov 09, 1955   Cancelled Treatment:    Reason Eval/Treat Not Completed: Patient at procedure or test/unavailable. Pt in HD   Angelina Ok [facility] 07/08/2017, 3:37 PM [facility] PT [phone]

## 2017-07-08 NOTE — Care Management Note (Addendum)
Case Management Note  Patient Details  Name: Torey SOMTOCHUKWU HIRACHETA MRN: [id number] Date of Birth: [birth year]  Subjective/Objective:   Pt presented for Chest Pain- Previously here and was set up with [facility] RN, PT, SW Aide. Cindi CSW with [facility] was working with patient from home to [facility] No beds available at [facility] at this time.                Action/Plan: CM did receive information from [facility]) that his personal care assistant is Grayling Congress and she can be reached @ [phone]. CM did make Maralyn Sago CSW aware to see if pt can be placed @ a long term Care Facility via Medicaid Benefit. If pt can't be placed, pt will need resumption orders for [facility] listed above. No further needs from this CM at this time.   Expected Discharge Date:               Expected Discharge Plan:  Skilled Nursing Facility  In-House Referral:  Clinical Social Work  Discharge planning Services  CM Consult  Post Acute Care Choice:   N/A Choice offered to:   N/A  DME Arranged:    N/A DME Agency:   N/A  HH Arranged:   N/A HH Agency:   N/A  Status of Service: Completed  If discussed at Long Length of Stay Meetings, dates discussed:  07-19-17, 07-21-17  Additional Comments: 1132 07-22-17 Tomi Bamberger, RN,BSN [phone] CM did speak with CSW in regards to disposition needs- plan to transition to  [facility] on 07-23-17 @ 2:30 pm via [employer]. [facility] aware that pt will transition to facility. No further needs from CM at this time.    1157 07-20-17 Tomi Bamberger, RN, BSN [phone] CSW following for SNF at [facility]- bed is available. CSW working on getting patients HD changed from [facility] to [facility]. Information has been faxed- awaiting approval from [facility]. [facility] CM did speak with pt on 07-19-17 in regards to patients belongings. CM will continue to monitor for additional needs.    1541  07-15-17 Tomi Bamberger, RN,BSN [phone] CM did speak with CSW- plan for bed at [facility] and working on assistance with changing HD Centers- No further needs from CM at this time.   Gala Lewandowsky, RN 07/08/2017, 3:05 PM

## 2017-07-08 NOTE — Evaluation (Signed)
Occupational Therapy Evaluation Patient Details Name: Richard Barnett MRN: [id number] DOB: 07-16-1955 Today's Date: 07/08/2017    History of Present Illness  Richard Barnett is a 62 y.o. Dax with with a history of CAD status post stenting in November 2018, and a recent admission for cardiac arrest, with admission from 06/24/2017 through 07/04/2017, status post cardiac catheterization on 06/28/2017, showing diffuse disease in the LAD with 40 to 50% narrowing, subsequently placing an ICD on 06/29/2017, and discharged in stable condition.  At the time, skilled nursing facility was recommended, but patient chose to be discharged to home.  Admitted with chest pain.   Clinical Impression   Pt was struggling at home to perform ADL/IADL. Pt is currently mod A for LB ADL, min guard for bed mobility, Pt requires cues throughout session for precautions. Pt is able to state precautions, but not apply them to functional situations. Pt is very weak, deconditioned, and will require skilled occupational therapy in the acute setting and afterwards at the SNF level to maximize safety and independence. Pt is motivated and wants to regain independence for 22 year old son - and needs higher skilled care to ensure safety and return to PLOF. Next session to focus on energy conservation education as well as bed mobility with precautions (Pt says the hardest part for him is getting in and out of a flat bed with no rails due to the pain)    Follow Up Recommendations  SNF;Supervision/Assistance - 24 hour    Equipment Recommendations  Other (comment)(defer to next venue)    Recommendations for Other Services       Precautions / Restrictions Precautions Precautions: Fall;ICD/Pacemaker Restrictions Weight Bearing Restrictions: Yes LUE Weight Bearing: Non weight bearing Other Position/Activity Restrictions: No pushing, pulling, lifting      Mobility Bed Mobility Overal bed mobility: Needs Assistance Bed Mobility: Supine  to Sit;Sit to Supine     Supine to sit: Supervision Sit to supine: Min guard Sit to sidelying: Min guard General bed mobility comments: vc for precautions  Transfers Overall transfer level: Needs assistance Equipment used: None Transfers: Sit to/from Stand Sit to Stand: Min guard         General transfer comment: Pt with no dizziness, just fatigue with standing    Balance Overall balance assessment: Needs assistance Sitting-balance support: Feet supported Sitting balance-Leahy Scale: Good     Standing balance support: Single extremity supported;No upper extremity supported Standing balance-Leahy Scale: Fair Standing balance comment: hand hold A for balance                           ADL either performed or assessed with clinical judgement   ADL Overall ADL's : Needs assistance/impaired Eating/Feeding: Independent;Sitting   Grooming: Wash/dry hands;Wash/dry face;Set up;Sitting Grooming Details (indicate cue type and reason): EOB, Pt unable to tolerate standing at sink for ADL at this time due to fatigue Upper Body Bathing: Minimal assistance;Sitting   Lower Body Bathing: Moderate assistance   Upper Body Dressing : Moderate assistance;Sitting Upper Body Dressing Details (indicate cue type and reason): educated in sequence for dressing, Pt with no recall from previous instruction Lower Body Dressing: Moderate assistance;Sit to/from stand Lower Body Dressing Details (indicate cue type and reason): assist for tasks that require BUE assist (like donning underwear/pants) Toilet Transfer: Minimal assistance;Ambulation;Regular Toilet;Grab bars Toilet Transfer Details (indicate cue type and reason): HHA for balance Toileting- Clothing Manipulation and Hygiene: Sit to/from stand;Minimal assistance  Functional mobility during ADLs: Minimal assistance General ADL Comments: Pt with generalized weakness/deconditioning and disconnect of precautions      Vision  Patient Visual Report: No change from baseline       Perception     Praxis      Pertinent Vitals/Pain Pain Assessment: Faces Faces Pain Scale: Hurts little more Pain Location: chest Pain Descriptors / Indicators: Aching;Grimacing;Sore Pain Intervention(s): Monitored during session;Repositioned     Hand Dominance Right   Extremity/Trunk Assessment Upper Extremity Assessment Upper Extremity Assessment: LUE deficits/detail LUE Deficits / Details: moves arms spontaneously despite precautions   Lower Extremity Assessment Lower Extremity Assessment: Defer to PT evaluation   Cervical / Trunk Assessment Cervical / Trunk Assessment: Normal   Communication Communication Communication: No difficulties   Cognition Arousal/Alertness: Awake/alert Behavior During Therapy: WFL for tasks assessed/performed Overall Cognitive Status: No family/caregiver present to determine baseline cognitive functioning Area of Impairment: Memory;Problem solving;Safety/judgement                   Current Attention Level: Selective Memory: Decreased short-term memory;Decreased recall of precautions   Safety/Judgement: Decreased awareness of safety;Decreased awareness of deficits   Problem Solving: Slow processing;Requires verbal cues General Comments: Pt can verbalized precautions, but then uses LUE in tasks and requires cues for NWB   General Comments       Exercises     Shoulder Instructions      Home Living Family/patient expects to be discharged to:: Skilled nursing facility Living Arrangements: Alone   Type of Home: Apartment Home Access: Elevator     Home Layout: One level     Bathroom Shower/Tub: Tub/shower unit;Curtain   Firefighter: Standard     Home Equipment: Cane - single point          Prior Functioning/Environment Level of Independence: Independent with assistive device(s)        Comments: was having difficulty in all areas, but still trying to go to  store for himself since d/c        OT Problem List: Decreased activity tolerance;Impaired balance (sitting and/or standing);Pain      OT Treatment/Interventions: Self-care/ADL training;Balance training;DME and/or AE instruction;Patient/family education    OT Goals(Current goals can be found in the care plan section) Acute Rehab OT Goals Patient Stated Goal: get stronger at rehab OT Goal Formulation: With patient Time For Goal Achievement: 07/22/17 Potential to Achieve Goals: Good ADL Goals Pt Will Perform Grooming: with modified independence;standing Pt Will Perform Upper Body Dressing: with modified independence;sitting Pt Will Perform Lower Body Dressing: with modified independence;sit to/from stand Pt Will Transfer to Toilet: with modified independence;ambulating Pt Will Perform Toileting - Clothing Manipulation and hygiene: with modified independence;sit to/from stand Additional ADL Goal #1: Pt will be aware of defibrillator precautions and follow them throughout OT session at independent level Additional ADL Goal #2: Pt will recall 3 energy conservation strategies to use during ADL at independent level  OT Frequency: Min 2X/week   Barriers to D/C: Decreased caregiver support          Co-evaluation              AM-PAC PT "6 Clicks" Daily Activity     Outcome Measure Help from another person eating meals?: None Help from another person taking care of personal grooming?: A Little Help from another person toileting, which includes using toliet, bedpan, or urinal?: A Little Help from another person bathing (including washing, rinsing, drying)?: A Lot Help from another person to put on  and taking off regular upper body clothing?: A Little Help from another person to put on and taking off regular lower body clothing?: A Lot 6 Click Score: 17   End of Session Equipment Utilized During Treatment: Gait belt Nurse Communication: Mobility status;Precautions  Activity  Tolerance: Patient limited by fatigue Patient left: in bed;with call bell/phone within reach  OT Visit Diagnosis: Unsteadiness on feet (R26.81);Muscle weakness (generalized) (M62.81);Pain Pain - Right/Left: Left Pain - part of body: Shoulder;Arm                Time: 1610-9604 OT Time Calculation (min): 21 min Charges:  OT General Charges $OT Visit: 1 Visit OT Evaluation $OT Eval Moderate Complexity: 1 Mod G-Codes:     Sherryl Manges OTR/L [phone]  Evern Bio Yulitza Shorts 07/08/2017, 6:47 PM

## 2017-07-08 NOTE — Progress Notes (Addendum)
[facility] Progress Note   Interval History:  62 year old Richard Barnett with ESRD on HD, HTN, DM, CAD s/p stent, depression. Recent [facility] adm 4/19-4/29/19 s/p cardiac arrest at dialysis center with CPR/NSTEMI. Cath showed diffusely diseased LAD with 40-50% narrowing. AICD placed 4/24. Readmitted with ongoing nonspecific chest pain. No acute changes on EKG. CXR clear. Troponin neg. V/Q scan neg for PE.  Seen on HD. He describes nonspecific pain all over chest, worse when he moves. Pain is reproducible with palpation to chest. Denies SOB, abd pain, N,V,D.    Objective Vitals:   07/08/17 0505 07/08/17 0720 07/08/17 0735 07/08/17 0745  BP: 138/74 134/77 132/84 (!) 141/83  Pulse: 81 80 72 83  Resp:  17 18 17   Temp: 98.5 F (36.9 C) (!) 95.8 F (35.4 C) 97.9 F (36.6 C)   TempSrc: Oral Oral Oral   SpO2: 100% 100% 100%   Weight: 96.5 kg (212 lb 11.9 oz)     Height:       Physical Exam General: WNWD Richard Barnett NAD Heart: RRR Lungs: CTAB  Abdomen: soft NT Extremities: no LE edema  Dialysis Access: LUE AVG cannulated on HD   Dialysis Orders:  [facility] 4 hoursEDW 98 kg MWF HD Bath2K/ 2 Ca, DialyzerF180 BFR 400 mL/ min, Heparin5000 u bolus. AccessLUE AVG. -Mircera 200 q 2 wks (last 5/1)  -Venofer 50 q wk  -Calcitriol 0.5 TIW   Assessment/Plan: 1. Chest pain s/p cardiac arrest 4/19, s/p AICD 4/24. Per primary. Still with musculoskeletal pain following CPR 2. ESRD -  MWF. HD today on schedule  3. HTN/volume - BP controlled. No volume excess on exam. Below EDW here. Follow weights 4. Anemia-  Hgb 8.7. ESA recently dosed as outpatient.  5. MBD of CKD - Ca ok. Continue Calcitriol/Renagel 2 tabs qac 6. Nutrition - Renal diet/vitamins 7. DM - SSI  8. Depression Dispo - Lives alone. SW consulted for facility placement   Ogechi Susann Givens PA-C [facility] Pager [phone] 07/08/2017,9:21 AM  LOS: 1 day   Pt seen, examined and agree w A/P as above.  Vinson Moselle  MD [facility] pager [phone]   07/08/2017, 1:36 PM    Additional Objective Labs: Basic Metabolic Panel: Recent Labs  Lab 07/04/17 0614 07/07/17 0808 07/08/17 0518  NA 136 136 139  K 4.4 4.3 4.2  CL 100* 92* 95*  CO2 26 30 30   GLUCOSE 92 102* 112*  BUN 38* 20 24*  CREATININE 9.46* 6.87* 8.03*  CALCIUM 8.9 8.8* 9.1   CBC: Recent Labs  Lab 07/02/17 0502 07/03/17 0408 07/04/17 0614 07/07/17 0808 07/08/17 0518  WBC 7.7 6.2 7.0 8.0 7.1  NEUTROABS  --   --   --  3.6  --   HGB 9.6* 8.5* 8.4* 8.3* 8.7*  HCT 30.6* 27.3* 26.8* 26.3* 28.2*  MCV 94.2 93.5 92.7 93.3 93.7  PLT 326 327 345 377 424*   Blood Culture    Component Value Date/Time   SDES BLOOD RIGHT FOOT 06/24/2017 2255   SPECREQUEST  06/24/2017 2255    BOTTLES DRAWN AEROBIC ONLY Blood Culture results may not be optimal due to an inadequate volume of blood received in culture bottles   CULT  06/24/2017 2255    NO GROWTH 5 DAYS Performed at [facility], [address] [place], [place] [address]    REPTSTATUS 06/30/2017 FINAL 06/24/2017 2255    Cardiac Enzymes: Recent Labs  Lab 07/07/17 [phone] 07/07/17 1456 07/08/17 0518  TROPONINI 0.03* 0.03* <0.03   CBG: Recent Labs  Lab 07/03/17 1114 07/03/17 1710 07/03/17 2113 07/04/17 1234 07/07/17 2039  GLUCAP 139* 108* 112* 121* 136*   Iron Studies: No results for input(s): IRON, TIBC, TRANSFERRIN, FERRITIN in the last 72 hours. Lab Results  Component Value Date   INR 1.02 06/24/2017   INR 0.98 05/13/2017   INR 0.99 01/14/2017   Medications: . sodium chloride    . sodium chloride     . aspirin EC  81 mg Oral Daily  . atorvastatin  80 mg Oral q1800  . clopidogrel  75 mg Oral Daily  . heparin injection (subcutaneous)  5,000 Units Subcutaneous Q8H  . insulin aspart  0-9 Units Subcutaneous TID WC  . latanoprost  1 drop Both Eyes Daily  . metoprolol tartrate  12.5 mg Oral BID  . pantoprazole  40 mg Oral Q0600  . sertraline   50 mg Oral QHS  . sevelamer carbonate  800 mg Oral TID WC

## 2017-07-08 NOTE — Evaluation (Signed)
Physical Therapy Evaluation Patient Details Name: Richard Barnett MRN: [id number] DOB: 23-Nov-1955 Today's Date: 07/08/2017   History of Present Illness   Richard Barnett is a 62 y.o. Yaviel with with a history of CAD status post stenting in November 2018, and a recent admission for cardiac arrest, with admission from 06/24/2017 through 07/04/2017, status post cardiac catheterization on 06/28/2017, showing diffuse disease in the LAD with 40 to 50% narrowing, subsequently placing an ICD on 06/29/2017, and discharged in stable condition.  At the time, skilled nursing facility was recommended, but patient chose to be discharged to home.  Admitted with chest pain.  Clinical Impression  Patient presents with decreased mobility due to deficits listed in PT problem list.  Currently needs min A for mobility and unable to ambulate due to weakness, low BP and not eating after dialysis.  Encouraged mobility later with nursing staff.  Recommend SNF level rehab at d/c as pt at home previously and struggling with taking care of himself.  Remains limited with regard to awareness of his precautions and has fallen once since d/c home remaining at risk for falls.  PT to follow acutely.    Follow Up Recommendations SNF;Supervision/Assistance - 24 hour    Equipment Recommendations  None recommended by PT    Recommendations for Other Services       Precautions / Restrictions Precautions Precautions: Fall;ICD/Pacemaker Restrictions Weight Bearing Restrictions: Yes LUE Weight Bearing: Non weight bearing Other Position/Activity Restrictions: No pushing, pulling, lifting      Mobility  Bed Mobility Overal bed mobility: Needs Assistance Bed Mobility: Supine to Sit;Sit to Sidelying     Supine to sit: Supervision   Sit to sidelying: Min guard General bed mobility comments: assist for safety and pt using rails and not following ICD precautions to sit on EOB, to supine cues and assist for  technique  Transfers Overall transfer level: Needs assistance Equipment used: None Transfers: Sit to/from Stand Sit to Stand: Supervision         General transfer comment: light headed in standing so took orthostatic vitals as in flow sheet, pt reported also not eating so RN checked CBG and was 147.  Advised RN and pt to try and walk later maybe after dinner  Ambulation/Gait                Stairs            Wheelchair Mobility    Modified Rankin (Stroke Patients Only)       Balance Overall balance assessment: Needs assistance Sitting-balance support: Feet supported Sitting balance-Leahy Scale: Good     Standing balance support: Single extremity supported;No upper extremity supported Standing balance-Leahy Scale: Fair Standing balance comment: hand hold A in standing due to feeling dizzy                             Pertinent Vitals/Pain Faces Pain Scale: Hurts even more Pain Location: chest Pain Descriptors / Indicators: Aching;Grimacing;Sore Pain Intervention(s): Repositioned;Monitored during session    Home Living Family/patient expects to be discharged to:: Skilled nursing facility Living Arrangements: Alone   Type of Home: Apartment Home Access: Elevator     Home Layout: One level Home Equipment: Cane - single point      Prior Function Level of Independence: Independent with assistive device(s)         Comments: was having difficulty, but still trying to go to store for himself since d/c  Hand Dominance   Dominant Hand: Right    Extremity/Trunk Assessment   Upper Extremity Assessment LUE Deficits / Details: moves arms spontaneously despite precautions    Lower Extremity Assessment Lower Extremity Assessment: Generalized weakness       Communication   Communication: No difficulties  Cognition Arousal/Alertness: Awake/alert Behavior During Therapy: WFL for tasks assessed/performed Overall Cognitive Status: No  family/caregiver present to determine baseline cognitive functioning Area of Impairment: Memory;Problem solving;Safety/judgement                   Current Attention Level: Selective Memory: Decreased short-term memory;Decreased recall of precautions   Safety/Judgement: Decreased awareness of safety;Decreased awareness of deficits   Problem Solving: Slow processing;Requires verbal cues        General Comments      Exercises     Assessment/Plan    PT Assessment Patient needs continued PT services  PT Problem List Decreased knowledge of use of DME;Decreased activity tolerance;Decreased strength;Decreased mobility;Decreased cognition;Decreased knowledge of precautions;Decreased safety awareness;Decreased balance       PT Treatment Interventions DME instruction;Therapeutic activities;Therapeutic exercise;Gait training;Patient/family education;Balance training;Functional mobility training    PT Goals (Current goals can be found in the Care Plan section)  Acute Rehab PT Goals Patient Stated Goal: get home PT Goal Formulation: With patient Time For Goal Achievement: 07/22/17 Potential to Achieve Goals: Good    Frequency Min 3X/week   Barriers to discharge        Co-evaluation               AM-PAC PT "6 Clicks" Daily Activity  Outcome Measure Difficulty turning over in bed (including adjusting bedclothes, sheets and blankets)?: A Lot Difficulty moving from lying on back to sitting on the side of the bed? : A Lot Difficulty sitting down on and standing up from a chair with arms (e.g., wheelchair, bedside commode, etc,.)?: Unable Help needed moving to and from a bed to chair (including a wheelchair)?: A Little   Help needed climbing 3-5 steps with a railing? : Total 6 Click Score: 9    End of Session Equipment Utilized During Treatment: Gait belt Activity Tolerance: Treatment limited secondary to medical complications (Comment) Patient left: in bed;with call  bell/phone within reach;with nursing/sitter in room   PT Visit Diagnosis: Other abnormalities of gait and mobility (R26.89);Difficulty in walking, not elsewhere classified (R26.2);Muscle weakness (generalized) (M62.81)    Time: 1610-9604 PT Time Calculation (min) (ACUTE ONLY): 30 min   Charges:   PT Evaluation $PT Eval Moderate Complexity: 1 Mod PT Treatments $Therapeutic Activity: 8-22 mins   PT G CodesSheran Lawless, [place] [phone] 07/08/2017   Elray Mcgregor 07/08/2017, 5:34 PM

## 2017-07-08 NOTE — Progress Notes (Signed)
Patient ID: Richard Barnett, Richard Barnett   DOB: 10/14/1955, 62 y.o.   MRN: [id number]  PROGRESS NOTE    Richard Barnett  KVQ:[id number] DOB: [birth year] DOA: 07/07/2017 PCP: [facility], [facility]    Brief Narrative:  Richard Barnett is a 62 y.o. Richard Barnett with with a history of CAD status post stenting in November 2018, and a recent admission for cardiac arrest, with admission from 06/24/2017 through 07/04/2017, status post cardiac catheterization on 06/28/2017, showing diffuse disease in the LAD with 40 to 50% narrowing, subsequently placing an ICD on 06/29/2017, and discharged in stable condition.  At the time, skilled nursing facility was recommended, but patient chose to be discharged to home.  Since his discharge, the patient has been on and off experiencing substernal chest pain.  However, today, he reports sudden onset of severe chest pain, with associated diaphoresis.  This began this morning.  He denies any syncope or presyncope.  He denies any palpitations.  He denies any associated shortness of breath, or cough.  He denies any pleuritic chest pain.  However, the pain is characterized as dull, worse with movements.  In fact, the patient reports not wanting to move, due to the pain which appears to be reproducible.  He was concerned at the time, that his pacemaker was malfunctioning, but this was clear after interrogation.  The patient denies any nausea or vomiting, denies any abdominal pain.  The patient still makes some urine, denies any dysuria or gross hematuria.  He denies any lower extremity swelling or calf pain.  The patient did not take anymedications to treat this pain.  Time of evaluation, he describes it as 10 out of 10.  Compliant with his medications, denies tobacco, alcohol or recreational drug use.   Assessment & Plan:   Principal Problem:   Chest pain Active Problems:   CAD (coronary artery disease)   Hypertension   Hyperlipidemia   Prediabetes   Anemia of chronic renal failure  Secondary hyperparathyroidism of renal origin (HCC)   Arthritis   Depression   Deep vein thrombosis (HCC)   Diabetes mellitus (HCC)   NSTEMI (non-ST elevated myocardial infarction) (HCC)   GERD (gastroesophageal reflux disease)   ESRD on dialysis (HCC)   Major depressive disorder, recurrent episode, severe (HCC)   Adjustment disorder with mixed disturbance of emotions and conduct   Cardiac arrest (HCC)   Chest pain syndrome   #1 chest pain: Appears to have resolved. Appears to be noncardiac. Continue monitoring on telemetry. Enzymes so far negative. VQ scan negative.  #2 end-stage renal disease: Status post hemodialysis today.  #3 hypertension: Continue home regimen.  #4 depression: Patient still looks withdrawn. Counseling provided.  #5 diabetes: Blood sugar appears well controlled.  #6 disposition: Patient awaiting bed at the skilled facility. Will be discharged when bed available  DVT prophylaxis: Heparin Code Status: Full Family Communication: None available Disposition Plan: Home when ready   Consultants:   Dr Bettina Gavia, Nephrology  Procedures:  -None  Antimicrobials:   None    Subjective: Patient is doing better now. He has no specific complaints. His chest pain is on and off and appears to be atypical. He had an episode while in hemodialysis but currently resolved.  Objective: Vitals:   07/08/17 0945 07/08/17 1015 07/08/17 1045 07/08/17 1115  BP: 135/65 (!) 145/56 135/64 136/78  Pulse: 80 89 88 87  Resp:      Temp:      TempSrc:      SpO2:  99%  Weight:      Height:        Intake/Output Summary (Last 24 hours) at 07/08/2017 1240 Last data filed at 07/08/2017 0426 Gross per 24 hour  Intake 310.17 ml  Output 200 ml  Net 110.17 ml   Filed Weights   07/07/17 1700 07/08/17 0505  Weight: 96.8 kg (213 lb 8 oz) 96.5 kg (212 lb 11.9 oz)    Examination:  General exam: Appears calm and comfortable  Respiratory system: Clear to auscultation.  Respiratory effort normal. Cardiovascular system: S1 & S2 heard, RRR. No JVD, murmurs, rubs, gallops or clicks. No pedal edema. Gastrointestinal system: Abdomen is nondistended, soft and nontender. No organomegaly or masses felt. Normal bowel sounds heard. Central nervous system: Alert and oriented. No focal neurological deficits. Extremities: Symmetric 5 x 5 power. Skin: No rashes, lesions or ulcers Psychiatry: Judgement and insight appear normal. Mood & affect appropriate.     Data Reviewed: I have personally reviewed following labs and imaging studies  CBC: Recent Labs  Lab 07/02/17 0502 07/03/17 0408 07/04/17 0614 07/07/17 0808 07/08/17 0518  WBC 7.7 6.2 7.0 8.0 7.1  NEUTROABS  --   --   --  3.6  --   HGB 9.6* 8.5* 8.4* 8.3* 8.7*  HCT 30.6* 27.3* 26.8* 26.3* 28.2*  MCV 94.2 93.5 92.7 93.3 93.7  PLT 326 327 345 377 424*   Basic Metabolic Panel: Recent Labs  Lab 07/02/17 0502 07/03/17 0408 07/04/17 0614 07/07/17 0808 07/08/17 0518  NA 134* 134* 136 136 139  K 4.4 4.4 4.4 4.3 4.2  CL 94* 97* 100* 92* 95*  CO2 29 26 26 30 30   GLUCOSE 102* 106* 92 102* 112*  BUN 23* 32* 38* 20 24*  CREATININE 7.04* 8.65* 9.46* 6.87* 8.03*  CALCIUM 9.2 8.8* 8.9 8.8* 9.1  MG 2.2 2.3 2.3  --   --    GFR: Estimated Creatinine Clearance: 10.6 mL/min (A) (by C-G formula based on SCr of 8.03 mg/dL (H)). Liver Function Tests: Recent Labs  Lab 07/07/17 0808  AST 113*  ALT 147*  ALKPHOS 216*  BILITOT 0.6  PROT 6.9  ALBUMIN 3.7   No results for input(s): LIPASE, AMYLASE in the last 168 hours. No results for input(s): AMMONIA in the last 168 hours. Coagulation Profile: No results for input(s): INR, PROTIME in the last 168 hours. Cardiac Enzymes: Recent Labs  Lab 07/07/17 0808 07/07/17 1456 07/08/17 0518  TROPONINI 0.03* 0.03* <0.03   BNP (last 3 results) No results for input(s): PROBNP in the last 8760 hours. HbA1C: Recent Labs    07/08/17 0518  HGBA1C 5.0    CBG: Recent Labs  Lab 07/03/17 1114 07/03/17 1710 07/03/17 2113 07/04/17 1234 07/07/17 2039  GLUCAP 139* 108* 112* 121* 136*   Lipid Profile: No results for input(s): CHOL, HDL, LDLCALC, TRIG, CHOLHDL, LDLDIRECT in the last 72 hours. Thyroid Function Tests: No results for input(s): TSH, T4TOTAL, FREET4, T3FREE, THYROIDAB in the last 72 hours. Anemia Panel: No results for input(s): VITAMINB12, FOLATE, FERRITIN, TIBC, IRON, RETICCTPCT in the last 72 hours. Urine analysis:    Component Value Date/Time   COLORURINE YELLOW 07/07/2017 2252   APPEARANCEUR CLEAR 07/07/2017 2252   LABSPEC 1.009 07/07/2017 2252   PHURINE 9.0 (H) 07/07/2017 2252   GLUCOSEU NEGATIVE 07/07/2017 2252   HGBUR NEGATIVE 07/07/2017 2252   BILIRUBINUR NEGATIVE 07/07/2017 2252   KETONESUR NEGATIVE 07/07/2017 2252   PROTEINUR >=300 (A) 07/07/2017 2252   UROBILINOGEN 0.2 02/21/2015 0955  NITRITE NEGATIVE 07/07/2017 2252   LEUKOCYTESUR NEGATIVE 07/07/2017 2252   Sepsis Labs: @LABRCNTIP (procalcitonin:4,lacticidven:4)  ) Recent Results (from the past 240 hour(s))  Surgical PCR screen     Status: None   Collection Time: 06/29/17 12:14 AM  Result Value Ref Range Status   MRSA, PCR NEGATIVE NEGATIVE Final   Staphylococcus aureus NEGATIVE NEGATIVE Final    Comment: (NOTE) The Xpert SA Assay (FDA approved for NASAL specimens in patients 2 years of age and older), is one component of a comprehensive surveillance program. It is not intended to diagnose infection nor to guide or monitor treatment. Performed at [facility], [address], [place], [place] [address]          Radiology Studies: Dg Chest 2 View  Result Date: 07/07/2017 CLINICAL DATA:  Mid chest pain, started this AM. Pt was post cardiac arrest on 4/19, pacemaker placed. EXAM: CHEST - 2 VIEW COMPARISON:  06/30/2017 FINDINGS: Lateral view degraded by patient arm position. Numerous leads and wires project over the chest. Midline  trachea. Mild cardiomegaly. No pleural effusion or pneumothorax. No congestive failure. Clear lungs. IMPRESSION: Mild cardiomegaly, without acute disease. Electronically Signed   By: Jeronimo Greaves M.D.   On: 07/07/2017 08:58   Nm Pulmonary Vent And Perf (v/q Scan)  Result Date: 07/07/2017 CLINICAL DATA:  PE suspected, high pretest probability. Chest and back pain. EXAM: NUCLEAR MEDICINE VENTILATION - PERFUSION LUNG SCAN TECHNIQUE: Ventilation images were obtained in multiple projections using inhaled aerosol Tc-42m DTPA. Perfusion images were obtained in multiple projections after intravenous injection of Tc-75m-MAA. RADIOPHARMACEUTICALS:  31 mCi of Tc-56m DTPA aerosol inhalation and 4.2 mCi Tc7m-MAA IV COMPARISON:  Chest radiograph from earlier today. FINDINGS: Matched defect on the left lateral image correlates with a defibrillator battery pack on chest x-ray. No true parenchymal perfusion or ventilation defect is seen. IMPRESSION: Negative for pulmonary embolism. Electronically Signed   By: Marnee Spring M.D.   On: 07/07/2017 14:32        Scheduled Meds: . aspirin EC  81 mg Oral Daily  . atorvastatin  80 mg Oral q1800  . calcitRIOL  0.5 mcg Oral Q M,W,F-HD  . clopidogrel  75 mg Oral Daily  . heparin injection (subcutaneous)  5,000 Units Subcutaneous Q8H  . insulin aspart  0-9 Units Subcutaneous TID WC  . latanoprost  1 drop Both Eyes Daily  . metoprolol tartrate  12.5 mg Oral BID  . pantoprazole  40 mg Oral Q0600  . sertraline  50 mg Oral QHS  . sevelamer carbonate  800 mg Oral TID WC   Continuous Infusions: . sodium chloride    . sodium chloride       LOS: 1 day    Time spent: 33 minutes    Belmont Valli,LAWAL, MD [facility] Pager [phone]  If 7PM-7AM, please contact night-coverage [email or web address] Password [email or web address] 07/08/2017, 12:40 PM

## 2017-07-08 NOTE — NC FL2 (Signed)
[place] MEDICAID FL2 LEVEL OF CARE SCREENING TOOL     IDENTIFICATION  Patient Name: Richard Barnett Birthdate: May 03, 1955 Sex: Mace Admission Date (Current Location): 07/07/2017  [facility] and [place] Number:  Producer, television/film/video and Address:  [facility] [place]. [facility], [address] [place], [place] [address]      Provider Number: [id number]  Attending Physician Name and Address:  Rometta Emery, MD  Relative Name and Phone Number:       Current Level of Care: Hospital Recommended Level of Care: Skilled Nursing Facility Prior Approval Number:    Date Approved/Denied:   PASRR Number: [id number]  Discharge Plan: SNF    Current Diagnoses: Patient Active Problem List   Diagnosis Date Noted  . Chest pain syndrome 07/07/2017  . Cardiac arrest (HCC) 06/24/2017  . Adjustment disorder with mixed disturbance of emotions and conduct   . Major depressive disorder, recurrent episode, severe (HCC) 05/14/2017  . GERD (gastroesophageal reflux disease) 05/13/2017  . ESRD on dialysis (HCC) 05/13/2017  . Suicidal ideation 05/13/2017  . NSTEMI (non-ST elevated myocardial infarction) (HCC) 01/27/2017  . Diabetes mellitus (HCC)   . Anemia of chronic renal failure   . Secondary hyperparathyroidism of renal origin (HCC)   . Proteinuria   . Arthritis   . Depression   . Deep vein thrombosis (HCC)   . Myocardial infarction (HCC)   . Heart failure, unspecified (HCC)   . Heart murmur   . Prediabetes 02/22/2015  . Chronic kidney disease, stage IV (severe) (HCC)   . Hyperlipidemia 05/22/2014  . Chest pain 05/21/2014  . CAD (coronary artery disease) 05/21/2014  . Hypertension 05/21/2014    Orientation RESPIRATION BLADDER Height & Weight     Self, Time, Situation, Place  Normal Continent Weight: 212 lb 11.9 oz (96.5 kg) Height:  5\' 7"  (170.2 cm)  BEHAVIORAL SYMPTOMS/MOOD NEUROLOGICAL BOWEL NUTRITION STATUS  Other (Comment)(Cooperative, calm.) (None) Continent  Diet(Renal/carb modified with fluid restriction: 1200 mL.)  AMBULATORY STATUS COMMUNICATION OF NEEDS Skin   Limited Assist Verbally Surgical wounds                       Personal Care Assistance Level of Assistance  Bathing, Feeding, Dressing Bathing Assistance: Limited assistance Feeding assistance: Independent Dressing Assistance: Limited assistance     Functional Limitations Info  Sight, Hearing, Speech Sight Info: Adequate Hearing Info: Adequate Speech Info: Adequate    SPECIAL CARE FACTORS FREQUENCY  PT (By licensed PT), OT (By licensed OT), Blood pressure     PT Frequency: 5 x week OT Frequency: 5 x week            Contractures Contractures Info: Not present    Additional Factors Info  Code Status, Allergies, Psychotropic Code Status Info: Full Allergies Info: Penicillins   Insulin Sliding Scale Info: Depression, Adjustment disorder with mixed disturbance of emotions and conduct: Zoloft 50 mg PO QHS.       Current Medications (07/08/2017):  This is the current hospital active medication list Current Facility-Administered Medications  Medication Dose Route Frequency Provider Last Rate Last Dose  . 0.9 %  sodium chloride infusion  100 mL Intravenous PRN Ejigiri, Megan Mans, PA-C      . 0.9 %  sodium chloride infusion  100 mL Intravenous PRN Ejigiri, Megan Mans, PA-C      . acetaminophen (TYLENOL) tablet 650 mg  650 mg Oral Q4H PRN Marcos Eke, PA-C      . aspirin  EC tablet 81 mg  81 mg Oral Daily Marcos Eke, PA-C   81 mg at 07/07/17 1854  . atorvastatin (LIPITOR) tablet 80 mg  80 mg Oral q1800 Marcos Eke, PA-C   80 mg at 07/07/17 1854  . bisacodyl (DULCOLAX) suppository 10 mg  10 mg Rectal Daily PRN Marcos Eke, PA-C      . calcitRIOL (ROCALTROL) capsule 0.5 mcg  0.5 mcg Oral Q M,W,F-HD Tomasa Blase, PA-C      . clopidogrel (PLAVIX) tablet 75 mg  75 mg Oral Daily Marcos Eke, PA-C   75 mg at 07/07/17 1853  . gi cocktail  (Maalox,Lidocaine,Donnatal)  30 mL Oral QID PRN Marcos Eke, PA-C      . heparin injection 1,000 Units  1,000 Units Dialysis PRN Tomasa Blase, PA-C      . heparin injection 1,900 Units  20 Units/kg Dialysis PRN Tomasa Blase, PA-C      . heparin injection 5,000 Units  5,000 Units Subcutaneous Q8H Marcos Eke, PA-C   5,000 Units at 07/08/17 7341  . HYDROcodone-acetaminophen (NORCO/VICODIN) 5-325 MG per tablet 1-2 tablet  1-2 tablet Oral Q4H PRN Marcos Eke, PA-C      . insulin aspart (novoLOG) injection 0-9 Units  0-9 Units Subcutaneous TID WC Wertman, Sara E, PA-C      . latanoprost (XALATAN) 0.005 % ophthalmic solution 1 drop  1 drop Both Eyes Daily Wertman, Sara E, PA-C      . lidocaine (PF) (XYLOCAINE) 1 % injection 5 mL  5 mL Intradermal PRN Tomasa Blase, PA-C      . lidocaine-prilocaine (EMLA) cream 1 application  1 application Topical PRN Tomasa Blase, PA-C      . metoprolol tartrate (LOPRESSOR) tablet 12.5 mg  12.5 mg Oral BID Marlowe Kays E, PA-C   12.5 mg at 07/07/17 2245  . morphine 4 MG/ML injection 1 mg  1 mg Intravenous Q4H PRN Marcos Eke, PA-C      . ondansetron (ZOFRAN) tablet 4 mg  4 mg Oral Q6H PRN Marcos Eke, PA-C       Or  . ondansetron (ZOFRAN) injection 4 mg  4 mg Intravenous Q6H PRN Marcos Eke, PA-C      . pantoprazole (PROTONIX) EC tablet 40 mg  40 mg Oral Q0600 Marcos Eke, PA-C   40 mg at 07/08/17 0650  . pentafluoroprop-tetrafluoroeth (GEBAUERS) aerosol 1 application  1 application Topical PRN Ejigiri, Megan Mans, PA-C      . senna-docusate (Senokot-S) tablet 1 tablet  1 tablet Oral QHS PRN Marcos Eke, PA-C      . sertraline (ZOLOFT) tablet 50 mg  50 mg Oral QHS Marcos Eke, PA-C   50 mg at 07/07/17 2245  . sevelamer carbonate (RENVELA) tablet 800 mg  800 mg Oral TID WC Marcos Eke, PA-C   800 mg at 07/07/17 9379     Discharge Medications: Please see discharge summary for a list of  discharge medications.  Relevant Imaging Results:  Relevant Lab Results:   Additional Information SS#: [id number]. HD MWF at [facility].  Margarito Liner, LCSW

## 2017-07-08 NOTE — Clinical SW OB High Risk (Signed)
Patient has no bed offers at this time. 7 declines and 5 still pending.  Charlynn Court, CSW [phone]

## 2017-07-09 LAB — GLUCOSE, CAPILLARY
GLUCOSE-CAPILLARY: 131 mg/dL — AB (ref 65–99)
GLUCOSE-CAPILLARY: 149 mg/dL — AB (ref 65–99)
Glucose-Capillary: 110 mg/dL — ABNORMAL HIGH (ref 65–99)
Glucose-Capillary: 143 mg/dL — ABNORMAL HIGH (ref 65–99)

## 2017-07-09 NOTE — Progress Notes (Addendum)
[facility] Progress Note   Subjective:  Seen in room. S/p HD yesterday with 2.2L removed. Still with mild CP and dyspnea, which has been stable. No fever, chills, abdominal pain.  Objective Vitals:   07/08/17 1135 07/08/17 1629 07/08/17 2129 07/09/17 0539  BP: 124/74 115/77 112/76 (!) 129/93  Pulse: 89 78 83 83  Resp: 18   18  Temp: (!) 97.4 F (36.3 C) 97.9 F (36.6 C)  98 F (36.7 C)  TempSrc: Oral Oral  Oral  SpO2: 100% 100%    Weight: 93.7 kg (206 lb 9.1 oz)   94 kg (207 lb 4.8 oz)  Height:       Physical Exam General: Well appearing man, NAD Heart: RRR; no murmur Lungs: CTAB Abdomen: soft, non-tender Extremities: No LE edema Dialysis Access: LUE AVG + thrill  Additional Objective Labs: Basic Metabolic Panel: Recent Labs  Lab 07/04/17 0614 07/07/17 0808 07/08/17 0518  NA 136 136 139  K 4.4 4.3 4.2  CL 100* 92* 95*  CO2 26 30 30   GLUCOSE 92 102* 112*  BUN 38* 20 24*  CREATININE 9.46* 6.87* 8.03*  CALCIUM 8.9 8.8* 9.1   Liver Function Tests: Recent Labs  Lab 07/07/17 0808  AST 113*  ALT 147*  ALKPHOS 216*  BILITOT 0.6  PROT 6.9  ALBUMIN 3.7   CBC: Recent Labs  Lab 07/03/17 0408 07/04/17 0614 07/07/17 0808 07/08/17 0518  WBC 6.2 7.0 8.0 7.1  NEUTROABS  --   --  3.6  --   HGB 8.5* 8.4* 8.3* 8.7*  HCT 27.3* 26.8* 26.3* 28.2*  MCV 93.5 92.7 93.3 93.7  PLT 327 345 377 424*   Cardiac Enzymes: Recent Labs  Lab 07/07/17 0808 07/07/17 1456 07/08/17 0518 07/08/17 1237  TROPONINI 0.03* 0.03* <0.03 <0.03   Studies/Results: Nm Pulmonary Vent And Perf (v/q Scan)  Result Date: 07/07/2017 CLINICAL DATA:  PE suspected, high pretest probability. Chest and back pain. EXAM: NUCLEAR MEDICINE VENTILATION - PERFUSION LUNG SCAN TECHNIQUE: Ventilation images were obtained in multiple projections using inhaled aerosol Tc-11m DTPA. Perfusion images were obtained in multiple projections after intravenous injection of Tc-34m-MAA.  RADIOPHARMACEUTICALS:  31 mCi of Tc-51m DTPA aerosol inhalation and 4.2 mCi Tc89m-MAA IV COMPARISON:  Chest radiograph from earlier today. FINDINGS: Matched defect on the left lateral image correlates with a defibrillator battery pack on chest x-ray. No true parenchymal perfusion or ventilation defect is seen. IMPRESSION: Negative for pulmonary embolism. Electronically Signed   By: Marnee Spring M.D.   On: 07/07/2017 14:32   Medications: . sodium chloride    . sodium chloride     . aspirin EC  81 mg Oral Daily  . atorvastatin  80 mg Oral q1800  . calcitRIOL  0.5 mcg Oral Q M,W,F-HD  . clopidogrel  75 mg Oral Daily  . heparin injection (subcutaneous)  5,000 Units Subcutaneous Q8H  . insulin aspart  0-9 Units Subcutaneous TID WC  . latanoprost  1 drop Both Eyes Daily  . metoprolol tartrate  12.5 mg Oral BID  . pantoprazole  40 mg Oral Q0600  . sertraline  50 mg Oral QHS  . sevelamer carbonate  800 mg Oral TID WC    Dialysis Orders: [facility] 4 hoursEDW 98 kg MWF HD Bath2K/ 2 Ca, DialyzerF180 BFR 400 mL/ min, Heparin5000 u bolus. AccessLUE AVG. -Mircera 200 q 2 wks (last 5/1)  -Venofer 50 q wk  -Calcitriol 0.5 TIW  Assessment/Plan: 1. Chest pain s/p cardiac arrest 4/19, s/p AICD 4/24.  Per primary. Still with MSK pain following CPR. S/p negative VQ scan on 5/2. 2. ESRD: Continue per MWF schedule. Mild SOB reported, does not seem consistent with overload and not requiring oxygen. Follow and can consider extra HD if needed. 3. HTN/volume: BP controlled, well below EDW here. Will need to be changed on d/c. 4. Anemia: Hgb 8.7, not due to ESA yet. 5. MBD of CKD - Ca ok. Continue Calcitriol/Renagel 2 tabs qac 6. Nutrition - Renal diet/vitamins 7. DM - SSI  8. Depression 9. Dispo - Lives alone. SW consulted for facility placement   Ozzie Hoyle, Cordelia Poche 07/09/2017, 9:36 AM  [facility] Pager: [phone]  Pt seen, examined and agree w A/P as above.  Vinson Moselle  MD [employer] pager [phone]   07/09/2017, 12:54 PM

## 2017-07-09 NOTE — Clinical Social Work Note (Signed)
Pt still has no bed offers at this time.  [place], [place] [phone]

## 2017-07-09 NOTE — Plan of Care (Signed)
  Problem: Health Behavior/Discharge Planning: Goal: Ability to manage health-related needs will improve Outcome: Progressing   

## 2017-07-09 NOTE — Progress Notes (Addendum)
PROGRESS NOTE    Richard Barnett  JDB:[id number] DOB: December 20, 1955 DOA: 07/07/2017 PCP: [facility], [facility]  Brief Narrative61 y.o.malewithwith a history of CAD status post stenting in November 2018, and a recent admission for cardiac arrest, with admission from 4/19/2019through 07/04/2017, status post cardiac catheterization on 06/28/2017, showing diffuse disease in the LAD with 40 to 50% narrowing, subsequently placing an ICD on 06/29/2017, and discharged in stable condition. At the time, skilled nursing facility was recommended, but patient chose to be discharged to home. Since his discharge, the patient has been on and off experiencing substernal chest pain. However, today, he reports sudden onset of severe chest pain, with associated diaphoresis. This began this morning. He denies any syncope or presyncope. He denies any palpitations. He denies any associated shortness of breath, or cough. He denies any pleuritic chest pain. However, the pain is characterized as dull, worse with movements. In fact, the patient reports not wanting to move, due to the pain which appears to be reproducible. He was concerned at the time, that his pacemaker was malfunctioning, but this was clear after interrogation. The patient denies any nausea or vomiting, denies any abdominal pain. The patient still makes some urine, denies any dysuria or gross hematuria. He denies any lower extremity swelling or calf pain. The patient did not take anymedications to treat this pain. Time of evaluation, he describes it as 10 out of 10.Compliant with his medications, denies tobacco, alcohol or recreational drug use.     Assessment & Plan:   Principal Problem:   Chest pain Active Problems:   CAD (coronary artery disease)   Hypertension   Hyperlipidemia   Prediabetes   Anemia of chronic renal failure   Secondary hyperparathyroidism of renal origin (HCC)   Arthritis   Depression   Deep vein thrombosis  (HCC)   Diabetes mellitus (HCC)   NSTEMI (non-ST elevated myocardial infarction) (HCC)   GERD (gastroesophageal reflux disease)   ESRD on dialysis (HCC)   Major depressive disorder, recurrent episode, severe (HCC)   Adjustment disorder with mixed disturbance of emotions and conduct   Cardiac arrest (HCC)   Chest pain syndrome 1] atypical chest pain patient has pain at the site of incisions.  Also has a incision on the left mid chest which is covered with dressings.  That is where he is complaining of pain.  VQ scan negative.  2] end-stage renal disease on dialysis 3 times a week.  3] hypertension stable continue current medications.  4] type 2 diabetes he is not on any medicines for diabetes.  Blood sugar seems to be stable.  DVT prophylaxis: Heparin  code Status: Full code Family Communication: No family available  Disposition Plan: Skilled nursing facility placement. Consultants: Nephrology  Procedures: None Antimicrobials: None  Subjective: No complaints   Objective: Vitals:   07/08/17 1135 07/08/17 1629 07/08/17 2129 07/09/17 0539  BP: 124/74 115/77 112/76 (!) 129/93  Pulse: 89 78 83 83  Resp: 18   18  Temp: (!) 97.4 F (36.3 C) 97.9 F (36.6 C)  98 F (36.7 C)  TempSrc: Oral Oral  Oral  SpO2: 100% 100%    Weight: 93.7 kg (206 lb 9.1 oz)   94 kg (207 lb 4.8 oz)  Height:        Intake/Output Summary (Last 24 hours) at 07/09/2017 1018 Last data filed at 07/08/2017 2100 Gross per 24 hour  Intake 720 ml  Output 2200 ml  Net -1480 ml   [employer]  07/08/17 0505 07/08/17 1135 07/09/17 0539  Weight: 96.5 kg (212 lb 11.9 oz) 93.7 kg (206 lb 9.1 oz) 94 kg (207 lb 4.8 oz)    Examination:  General exam: Appears calm and comfortable  Respiratory system: Clear to auscultation. Respiratory effort normal. Cardiovascular system: S1 & S2 heard, RRR. No JVD, murmurs, rubs, gallops or clicks. No pedal edema. Gastrointestinal system: Abdomen is nondistended, soft and  nontender. No organomegaly or masses felt. Normal bowel sounds heard. Central nervous system: Alert and oriented. No focal neurological deficits. Extremities: Symmetric 5 x 5 power. Skin: No rashes, lesions or ulcers Psychiatry: Judgement and insight appear normal. Mood & affect appropriate.     Data Reviewed: I have personally reviewed following labs and imaging studies  CBC: Recent Labs  Lab 07/03/17 0408 07/04/17 0614 07/07/17 0808 07/08/17 0518  WBC 6.2 7.0 8.0 7.1  NEUTROABS  --   --  3.6  --   HGB 8.5* 8.4* 8.3* 8.7*  HCT 27.3* 26.8* 26.3* 28.2*  MCV 93.5 92.7 93.3 93.7  PLT 327 345 377 424*   Basic Metabolic Panel: Recent Labs  Lab 07/03/17 0408 07/04/17 0614 07/07/17 0808 07/08/17 0518  NA 134* 136 136 139  K 4.4 4.4 4.3 4.2  CL 97* 100* 92* 95*  CO2 26 26 30 30   GLUCOSE 106* 92 102* 112*  BUN 32* 38* 20 24*  CREATININE 8.65* 9.46* 6.87* 8.03*  CALCIUM 8.8* 8.9 8.8* 9.1  MG 2.3 2.3  --   --    GFR: Estimated Creatinine Clearance: 10.4 mL/min (A) (by C-G formula based on SCr of 8.03 mg/dL (H)). Liver Function Tests: Recent Labs  Lab 07/07/17 0808  AST 113*  ALT 147*  ALKPHOS 216*  BILITOT 0.6  PROT 6.9  ALBUMIN 3.7   No results for input(s): LIPASE, AMYLASE in the last 168 hours. No results for input(s): AMMONIA in the last 168 hours. Coagulation Profile: No results for input(s): INR, PROTIME in the last 168 hours. Cardiac Enzymes: Recent Labs  Lab 07/07/17 0808 07/07/17 1456 07/08/17 0518 07/08/17 1237  TROPONINI 0.03* 0.03* <0.03 <0.03   BNP (last 3 results) No results for input(s): PROBNP in the last 8760 hours. HbA1C: Recent Labs    07/08/17 0518  HGBA1C 5.0   CBG: Recent Labs  Lab 07/04/17 1234 07/07/17 2039 07/08/17 1618 07/08/17 2141 07/09/17 0733  GLUCAP 121* 136* 147* 189* 131*   Lipid Profile: No results for input(s): CHOL, HDL, LDLCALC, TRIG, CHOLHDL, LDLDIRECT in the last 72 hours. Thyroid Function Tests: No  results for input(s): TSH, T4TOTAL, FREET4, T3FREE, THYROIDAB in the last 72 hours. Anemia Panel: No results for input(s): VITAMINB12, FOLATE, FERRITIN, TIBC, IRON, RETICCTPCT in the last 72 hours. Sepsis Labs: No results for input(s): PROCALCITON, LATICACIDVEN in the last 168 hours.  No results found for this or any previous visit (from the past 240 hour(s)).       Radiology Studies: Nm Pulmonary Vent And Perf (v/q Scan)  Result Date: 07/07/2017 CLINICAL DATA:  PE suspected, high pretest probability. Chest and back pain. EXAM: NUCLEAR MEDICINE VENTILATION - PERFUSION LUNG SCAN TECHNIQUE: Ventilation images were obtained in multiple projections using inhaled aerosol Tc-79m DTPA. Perfusion images were obtained in multiple projections after intravenous injection of Tc-31m-MAA. RADIOPHARMACEUTICALS:  31 mCi of Tc-87m DTPA aerosol inhalation and 4.2 mCi Tc9m-MAA IV COMPARISON:  Chest radiograph from earlier today. FINDINGS: Matched defect on the left lateral image correlates with a defibrillator battery pack on chest x-ray. No true parenchymal perfusion  or ventilation defect is seen. IMPRESSION: Negative for pulmonary embolism. Electronically Signed   By: Marnee Spring M.D.   On: 07/07/2017 14:32        Scheduled Meds: . aspirin EC  81 mg Oral Daily  . atorvastatin  80 mg Oral q1800  . calcitRIOL  0.5 mcg Oral Q M,W,F-HD  . clopidogrel  75 mg Oral Daily  . heparin injection (subcutaneous)  5,000 Units Subcutaneous Q8H  . insulin aspart  0-9 Units Subcutaneous TID WC  . latanoprost  1 drop Both Eyes Daily  . metoprolol tartrate  12.5 mg Oral BID  . pantoprazole  40 mg Oral Q0600  . sertraline  50 mg Oral QHS  . sevelamer carbonate  800 mg Oral TID WC   Continuous Infusions: . sodium chloride    . sodium chloride       LOS: 2 days      Alwyn Ren, MD [facility]  If 7PM-7AM, please contact night-coverage [email or web address] Password [email or web address] 07/09/2017, 10:18 AM

## 2017-07-09 NOTE — Progress Notes (Signed)
Physical Therapy Treatment Patient Details Name: Richard Barnett MRN: [id number] DOB: 13-Aug-1955 Today's Date: 07/09/2017    History of Present Illness  Richard Barnett is a 62 y.o. [place] with with a history of CAD status post stenting in November 2018, and a recent admission for cardiac arrest, with admission from 06/24/2017 through 07/04/2017, status post cardiac catheterization on 06/28/2017, showing diffuse disease in the LAD with 40 to 50% narrowing, subsequently placing an ICD on 06/29/2017, and discharged in stable condition.  At the time, skilled nursing facility was recommended, but patient chose to be discharged to home.  Admitted with chest pain.    PT Comments    Pt asleep in bed with lunch tray uneaten. Pt awoken and agreeable to taking short walk before sitting up in chair to eat lunch. Pt supervision for supine to sit EoB with HoB elevated. Pt able to maintain ICD precautions with vc. Pt supervision for sit to stand without AD and min guard for ambulation. Pt gait slowed as he approached door and reported he was feeling nauseous and requested to return to recliner. Once in recliner pt reports feeling slightly better and thought he would feel better if he ate something. Pt did not want to participate in any therapeutic activity. PT continues to recommend SNF level rehab at d/c to improve strength and endurance to be able to safely navigate in his environment.     Follow Up Recommendations  SNF;Supervision/Assistance - 24 hour     Equipment Recommendations  None recommended by PT    Recommendations for Other Services       Precautions / Restrictions Precautions Precautions: Fall;ICD/Pacemaker Restrictions Weight Bearing Restrictions: Yes LUE Weight Bearing: Non weight bearing Other Position/Activity Restrictions: No pushing, pulling, lifting    Mobility  Bed Mobility Overal bed mobility: Needs Assistance Bed Mobility: Supine to Sit     Supine to sit: Supervision   Sit to  sidelying: Supervision General bed mobility comments: supervision for safety. vc for precaution  Transfers Overall transfer level: Needs assistance Equipment used: None Transfers: Sit to/from Stand Sit to Stand: Supervision         General transfer comment: slow power up and steadying in standing   Ambulation/Gait Ambulation/Gait assistance: Min guard Ambulation Distance (Feet): 20 Feet Assistive device: None Gait Pattern/deviations: Step-through pattern;Shuffle Gait velocity: slowed Gait velocity interpretation: <1.8 ft/sec, indicate of risk for recurrent falls General Gait Details: slow, labored pace, c/o of nausea after 8 feet ambulation and request to sit back down, agrees to ambulate to recliner to sit up for lunch       Balance Overall balance assessment: Needs assistance Sitting-balance support: Feet supported Sitting balance-Leahy Scale: Good     Standing balance support: No upper extremity supported Standing balance-Leahy Scale: Fair Standing balance comment: min guard for ambulation without AD                            Cognition Arousal/Alertness: Awake/alert Behavior During Therapy: WFL for tasks assessed/performed Overall Cognitive Status: No family/caregiver present to determine baseline cognitive functioning Area of Impairment: Memory;Problem solving;Safety/judgement                   Current Attention Level: Selective Memory: Decreased short-term memory;Decreased recall of precautions   Safety/Judgement: Decreased awareness of safety;Decreased awareness of deficits   Problem Solving: Slow processing;Requires verbal cues General Comments: requires vc to not use L UE  General Comments General comments (skin integrity, edema, etc.): Pt with c/o of nausea with mobility, reports he ate breakfast and just felt nauseous with walking in room.       Pertinent Vitals/Pain Pain Assessment: Faces Faces Pain Scale: Hurts little  more Pain Location: chest Pain Descriptors / Indicators: Aching;Grimacing;Sore Pain Intervention(s): Monitored during session;Limited activity within patient's tolerance;Repositioned           PT Goals (current goals can now be found in the care plan section) Acute Rehab PT Goals Patient Stated Goal: get home PT Goal Formulation: With patient Time For Goal Achievement: 07/22/17 Potential to Achieve Goals: Good Progress towards PT goals: Progressing toward goals    Frequency    Min 3X/week      PT Plan Current plan remains appropriate       AM-PAC PT "6 Clicks" Daily Activity  Outcome Measure  Difficulty turning over in bed (including adjusting bedclothes, sheets and blankets)?: A Little Difficulty moving from lying on back to sitting on the side of the bed? : A Little Difficulty sitting down on and standing up from a chair with arms (e.g., wheelchair, bedside commode, etc,.)?: A Little Help needed moving to and from a bed to chair (including a wheelchair)?: A Little Help needed walking in hospital room?: A Little Help needed climbing 3-5 steps with a railing? : Total 6 Click Score: 16    End of Session Equipment Utilized During Treatment: Gait belt Activity Tolerance: Other (comment)(limited to to nausea with ambulation ) Patient left: with call bell/phone within reach;with nursing/sitter in room;in chair Nurse Communication: Mobility status;Other (comment)(nausea) PT Visit Diagnosis: Other abnormalities of gait and mobility (R26.89);Difficulty in walking, not elsewhere classified (R26.2);Muscle weakness (generalized) (M62.81) Pain - part of body: (stomach)     Time: 4034-7425 PT Time Calculation (min) (ACUTE ONLY): 9 min  Charges:  $Gait Training: 8-22 mins                    G Codes:       [name] PT, DPT Acute Rehabilitation  [phone] Pager [phone]     [name] 07/09/2017, 1:04 PM

## 2017-07-09 NOTE — Plan of Care (Signed)
  Problem: Education: Goal: Knowledge of General Education information will improve Outcome: Completed/Met

## 2017-07-10 LAB — GLUCOSE, CAPILLARY
GLUCOSE-CAPILLARY: 117 mg/dL — AB (ref 65–99)
Glucose-Capillary: 128 mg/dL — ABNORMAL HIGH (ref 65–99)
Glucose-Capillary: 103 mg/dL — ABNORMAL HIGH (ref 65–99)
Glucose-Capillary: 101 mg/dL — ABNORMAL HIGH (ref 65–99)

## 2017-07-10 NOTE — Progress Notes (Addendum)
  [facility] Progress Note   Subjective:  Seen in room. Issues with CP and mild SOB again overnight, look comfortable at this time. No fever/chills.  Objective Vitals:   07/09/17 2030 07/09/17 2151 07/10/17 0517 07/10/17 0800  BP: 125/77 122/80 137/84   Pulse: 93 82 76   Resp:  18 18   Temp:  98.7 F (37.1 C) 98.4 F (36.9 C)   TempSrc:  Oral Oral   SpO2:  98% 98% 98%  Weight:   95.2 kg (209 lb 14.4 oz)   Height:       Physical Exam General: Well appearing man, NAD Heart: RRR; no murmur Lungs: CTAB Abdomen: soft, non-tender Extremities: No LE edema Dialysis Access: LUE AVG + thrill  Additional Objective Labs: Basic Metabolic Panel: Recent Labs  Lab 07/04/17 0614 07/07/17 0808 07/08/17 0518  NA 136 136 139  K 4.4 4.3 4.2  CL 100* 92* 95*  CO2 26 30 30   GLUCOSE 92 102* 112*  BUN 38* 20 24*  CREATININE 9.46* 6.87* 8.03*  CALCIUM 8.9 8.8* 9.1   Liver Function Tests: Recent Labs  Lab 07/07/17 0808  AST 113*  ALT 147*  ALKPHOS 216*  BILITOT 0.6  PROT 6.9  ALBUMIN 3.7   CBC: Recent Labs  Lab 07/04/17 0614 07/07/17 0808 07/08/17 0518  WBC 7.0 8.0 7.1  NEUTROABS  --  3.6  --   HGB 8.4* 8.3* 8.7*  HCT 26.8* 26.3* 28.2*  MCV 92.7 93.3 93.7  PLT 345 377 424*   Cardiac Enzymes: Recent Labs  Lab 07/07/17 0808 07/07/17 1456 07/08/17 0518 07/08/17 1237  TROPONINI 0.03* 0.03* <0.03 <0.03   CBG: Recent Labs  Lab 07/09/17 0733 07/09/17 1143 07/09/17 1651 07/09/17 2150 07/10/17 0735  GLUCAP 131* 110* 143* 149* 117*   Medications: . sodium chloride    . sodium chloride     . aspirin EC  81 mg Oral Daily  . atorvastatin  80 mg Oral q1800  . calcitRIOL  0.5 mcg Oral Q M,W,F-HD  . clopidogrel  75 mg Oral Daily  . heparin injection (subcutaneous)  5,000 Units Subcutaneous Q8H  . insulin aspart  0-9 Units Subcutaneous TID WC  . latanoprost  1 drop Both Eyes Daily  . metoprolol tartrate  12.5 mg Oral BID  . pantoprazole  40 mg  Oral Q0600  . sertraline  50 mg Oral QHS  . sevelamer carbonate  800 mg Oral TID WC   Dialysis Orders: [facility] 4 hoursEDW 98 kg MWF HD Bath2K/ 2 Ca, DialyzerF180 BFR 400 mL/ min, Heparin5000 u bolus. AccessLUE AVG. -Mircera200 q 2 wks (last 5/1)  -Venofer50 q wk  -Calcitriol 0.5TIW  Assessment/Plan: 1.Chest pain s/p cardiac arrest 4/19, s/p AICD 4/24. Per primary. Still with MSK pain following CPR. S/p negative VQ scan on 5/2. 2. ESRD: Continue per MWF schedule. Mild SOB reported, will try to challenge EDW lower w/ HD. 3.HTN/volume: BP controlled, well below EDW here. Will need to be changed on d/c. 4.Anemia: Hgb 8.7, not due to ESA yet. 5.MBD of CKD- Ca ok. Continue Calcitriol/Renagel 2 tabs qac 6. Nutrition -Renal diet/vitamins 7. DM - SSI  8. Depression 9. Dispo - Lives alone. SW consulted for facility placement   Ozzie Hoyle, Cordelia Poche 07/10/2017, 9:10 AM  [facility] Pager: [phone]  Pt seen, examined and agree w A/P as above.  Vinson Moselle MD [employer] pager [phone]   07/10/2017, 2:28 PM

## 2017-07-10 NOTE — Plan of Care (Signed)
  Problem: Clinical Measurements: Goal: Ability to maintain clinical measurements within normal limits will improve Outcome: Progressing   Problem: Clinical Measurements: Goal: Respiratory complications will improve Outcome: Progressing   Problem: Clinical Measurements: Goal: Cardiovascular complication will be avoided Outcome: Progressing   

## 2017-07-10 NOTE — Progress Notes (Signed)
PROGRESS NOTE    Richard Barnett  ZOX:[id number] DOB: Sep 13, 1955 DOA: 07/07/2017 PCP: [facility], [facility]  Brief Narrative: 62 y.o.malewithwith a history of CAD status post stenting in November 2018, and a recent admission for cardiac arrest, with admission from 4/19/2019through 07/04/2017, status post cardiac catheterization on 06/28/2017, showing diffuse disease in the LAD with 40 to 50% narrowing, subsequently placing an ICD on 06/29/2017, and discharged in stable condition. At the time, skilled nursing facility was recommended, but patient chose to be discharged to home. Since his discharge, the patient has been on and off experiencing substernal chest pain. However, today, he reports sudden onset of severe chest pain, with associated diaphoresis. This began this morning. He denies any syncope or presyncope. He denies any palpitations. He denies any associated shortness of breath, or cough. He denies any pleuritic chest pain. However, the pain is characterized as dull, worse with movements. In fact, the patient reports not wanting to move, due to the pain which appears to be reproducible. He was concerned at the time, that his pacemaker was malfunctioning, but this was clear after interrogation. The patient denies any nausea or vomiting, denies any abdominal pain. The patient still makes some urine, denies any dysuria or gross hematuria. He denies any lower extremity swelling or calf pain. The patient did not take anymedications to treat this pain. Time of evaluation, he describes it as 10 out of 10.Compliant with his medications, denies tobacco, alcohol or recreational drug use.    Assessment & Plan:   Principal Problem:   Chest pain Active Problems:   CAD (coronary artery disease)   Hypertension   Hyperlipidemia   Prediabetes   Anemia of chronic renal failure   Secondary hyperparathyroidism of renal origin (HCC)   Arthritis   Depression   Deep vein thrombosis  (HCC)   Diabetes mellitus (HCC)   NSTEMI (non-ST elevated myocardial infarction) (HCC)   GERD (gastroesophageal reflux disease)   ESRD on dialysis (HCC)   Major depressive disorder, recurrent episode, severe (HCC)   Adjustment disorder with mixed disturbance of emotions and conduct   Cardiac arrest (HCC)   Chest pain syndrome  1] atypical chest pain patient has pain at the site of incisions.  Also has a incision on the left mid chest which is covered with dressings.  That is where he is complaining of pain.  VQ scan negative.  2] end-stage renal disease on dialysis 3 times a week.  3] hypertension stable continue current medications.  4] type 2 diabetes he is not on any medicines for diabetes.  Blood sugar seems to be stable.  5] status post cardiac arrest 419 and AICD 06/29/2017      DVT prophylaxis: heparin Code Status:full Family Communication: No family available Disposition Plan: Patient is awaiting placement Consultants:  Nephrology Procedures: None Antimicrobials: None  Subjective: No complaints eating breakfast.  Objective: Vitals:   07/09/17 2030 07/09/17 2151 07/10/17 0517 07/10/17 0800  BP: 125/77 122/80 137/84   Pulse: 93 82 76   Resp:  18 18   Temp:  98.7 F (37.1 C) 98.4 F (36.9 C)   TempSrc:  Oral Oral   SpO2:  98% 98% 98%  Weight:   95.2 kg (209 lb 14.4 oz)   Height:        Intake/Output Summary (Last 24 hours) at 07/10/2017 1204 Last data filed at 07/10/2017 0800 Gross per 24 hour  Intake 840 ml  Output -  Net 840 ml   [employer]  07/08/17 1135 07/09/17 0539 07/10/17 0517  Weight: 93.7 kg (206 lb 9.1 oz) 94 kg (207 lb 4.8 oz) 95.2 kg (209 lb 14.4 oz)    Examination:  General exam: Appears calm and comfortable awake alert responds appropriately. Respiratory system: Clear to auscultation. Respiratory effort normal. Cardiovascular system: S1 & S2 heard, RRR. No JVD, murmurs, rubs, gallops or clicks. No pedal edema. Gastrointestinal  system: Abdomen is nondistended, soft and nontender. No organomegaly or masses felt. Normal bowel sounds heard. Central nervous system: Alert and oriented. No focal neurological deficits. Extremities: Symmetric 5 x 5 power. Skin: No rashes, lesions or ulcers Psychiatry: Judgement and insight appear normal. Mood & affect appropriate.     Data Reviewed: I have personally reviewed following labs and imaging studies  CBC: Recent Labs  Lab 07/04/17 0614 07/07/17 0808 07/08/17 0518  WBC 7.0 8.0 7.1  NEUTROABS  --  3.6  --   HGB 8.4* 8.3* 8.7*  HCT 26.8* 26.3* 28.2*  MCV 92.7 93.3 93.7  PLT 345 377 424*   Basic Metabolic Panel: Recent Labs  Lab 07/04/17 0614 07/07/17 0808 07/08/17 0518  NA 136 136 139  K 4.4 4.3 4.2  CL 100* 92* 95*  CO2 26 30 30   GLUCOSE 92 102* 112*  BUN 38* 20 24*  CREATININE 9.46* 6.87* 8.03*  CALCIUM 8.9 8.8* 9.1  MG 2.3  --   --    GFR: Estimated Creatinine Clearance: 10.5 mL/min (A) (by C-G formula based on SCr of 8.03 mg/dL (H)). Liver Function Tests: Recent Labs  Lab 07/07/17 0808  AST 113*  ALT 147*  ALKPHOS 216*  BILITOT 0.6  PROT 6.9  ALBUMIN 3.7   No results for input(s): LIPASE, AMYLASE in the last 168 hours. No results for input(s): AMMONIA in the last 168 hours. Coagulation Profile: No results for input(s): INR, PROTIME in the last 168 hours. Cardiac Enzymes: Recent Labs  Lab 07/07/17 0808 07/07/17 1456 07/08/17 0518 07/08/17 1237  TROPONINI 0.03* 0.03* <0.03 <0.03   BNP (last 3 results) No results for input(s): PROBNP in the last 8760 hours. HbA1C: Recent Labs    07/08/17 0518  HGBA1C 5.0   CBG: Recent Labs  Lab 07/09/17 1143 07/09/17 1651 07/09/17 2150 07/10/17 0735 07/10/17 1140  GLUCAP 110* 143* 149* 117* 128*   Lipid Profile: No results for input(s): CHOL, HDL, LDLCALC, TRIG, CHOLHDL, LDLDIRECT in the last 72 hours. Thyroid Function Tests: No results for input(s): TSH, T4TOTAL, FREET4, T3FREE,  THYROIDAB in the last 72 hours. Anemia Panel: No results for input(s): VITAMINB12, FOLATE, FERRITIN, TIBC, IRON, RETICCTPCT in the last 72 hours. Sepsis Labs: No results for input(s): PROCALCITON, LATICACIDVEN in the last 168 hours.  No results found for this or any previous visit (from the past 240 hour(s)).       Radiology Studies: No results found.      Scheduled Meds: . aspirin EC  81 mg Oral Daily  . atorvastatin  80 mg Oral q1800  . calcitRIOL  0.5 mcg Oral Q M,W,F-HD  . clopidogrel  75 mg Oral Daily  . heparin injection (subcutaneous)  5,000 Units Subcutaneous Q8H  . insulin aspart  0-9 Units Subcutaneous TID WC  . latanoprost  1 drop Both Eyes Daily  . metoprolol tartrate  12.5 mg Oral BID  . pantoprazole  40 mg Oral Q0600  . sertraline  50 mg Oral QHS  . sevelamer carbonate  800 mg Oral TID WC   Continuous Infusions: . sodium chloride    .  sodium chloride       LOS: 3 days     Alwyn Ren, MD [facility]  If 7PM-7AM, please contact night-coverage [email or web address] Password [email or web address] 07/10/2017, 12:04 PM

## 2017-07-11 LAB — RENAL FUNCTION PANEL
ALBUMIN: 3.7 g/dL (ref 3.5–5.0)
Anion gap: 12 (ref 5–15)
BUN: 30 mg/dL — ABNORMAL HIGH (ref 6–20)
CALCIUM: 9.2 mg/dL (ref 8.9–10.3)
CO2: 28 mmol/L (ref 22–32)
Chloride: 94 mmol/L — ABNORMAL LOW (ref 101–111)
Creatinine, Ser: 10.5 mg/dL — ABNORMAL HIGH (ref 0.61–1.24)
GFR calc non Af Amer: 5 mL/min — ABNORMAL LOW (ref >60)
GFR, EST AFRICAN AMERICAN: 5 mL/min — AB (ref >60)
GLUCOSE: 102 mg/dL — AB (ref 65–99)
PHOSPHORUS: 4.8 mg/dL — AB (ref 2.5–4.6)
Potassium: 4.1 mmol/L (ref 3.5–5.1)
Sodium: 134 mmol/L — ABNORMAL LOW (ref 135–145)

## 2017-07-11 LAB — CBC
HCT: 30 % — ABNORMAL LOW (ref 39.0–52.0)
Hemoglobin: 9.3 g/dL — ABNORMAL LOW (ref 13.0–17.0)
MCH: 29.3 pg (ref 26.0–34.0)
MCHC: 31 g/dL (ref 30.0–36.0)
MCV: 94.6 fL (ref 78.0–100.0)
PLATELETS: 464 10*3/uL — AB (ref 150–400)
RBC: 3.17 MIL/uL — AB (ref 4.22–5.81)
RDW: 16.4 % — ABNORMAL HIGH (ref 11.5–15.5)
WBC: 9.8 10*3/uL (ref 4.0–10.5)

## 2017-07-11 LAB — GLUCOSE, CAPILLARY
GLUCOSE-CAPILLARY: 116 mg/dL — AB (ref 65–99)
Glucose-Capillary: 169 mg/dL — ABNORMAL HIGH (ref 65–99)
Glucose-Capillary: 109 mg/dL — ABNORMAL HIGH (ref 65–99)

## 2017-07-11 MED ORDER — HEPARIN SODIUM (PORCINE) 1000 UNIT/ML DIALYSIS
20.0000 [IU]/kg | INTRAMUSCULAR | Status: DC | PRN
  Filled 2017-07-11: qty 2

## 2017-07-11 MED ORDER — SENNOSIDES-DOCUSATE SODIUM 8.6-50 MG PO TABS: 1.0000 | ORAL_TABLET | Freq: Every evening | ORAL | Status: AC | PRN

## 2017-07-11 MED ORDER — CALCITRIOL 0.5 MCG PO CAPS
ORAL_CAPSULE | ORAL | Status: AC
  Filled 2017-07-11: qty 1

## 2017-07-11 MED ORDER — CALCITRIOL 0.5 MCG PO CAPS
0.5000 ug | ORAL_CAPSULE | ORAL | 0 refills | Status: AC
Start: 2017-07-11 — End: ?

## 2017-07-11 MED ORDER — LIDOCAINE HCL (PF) 1 % IJ SOLN: 5.0000 mL | INTRAMUSCULAR | 0 refills | Status: AC | PRN

## 2017-07-11 MED ORDER — PENTAFLUOROPROP-TETRAFLUOROETH EX AERO: 1.0000 "application " | INHALATION_SPRAY | CUTANEOUS | 0 refills | Status: AC | PRN

## 2017-07-11 MED ORDER — LIDOCAINE-PRILOCAINE 2.5-2.5 % EX CREA: 1.0000 "application " | TOPICAL_CREAM | CUTANEOUS | 0 refills | Status: AC | PRN

## 2017-07-11 NOTE — Progress Notes (Signed)
  [facility] Progress Note    Subjective:   Feeling better   Objective:   BP (!) 135/92   Pulse 78   Temp 98.2 F (36.8 C)   Resp 17   Ht 5\' 7"  (1.702 m)   Wt 94.6 kg (208 lb 8.9 oz)   SpO2 98%   BMI 32.66 kg/m   Intake/Output: I/O last 3 completed shifts: In: 720 [P.O.:720] Out: -    Intake/Output this shift:  No intake/output data recorded. Weight change: 0.181 kg (6.4 oz)  Physical Exam: Gen: WD WN AAM in NAD CVS: no rub Resp: cta Abd: benign Ext: LUE AVG +T/B  Labs: BMET Recent Labs  Lab 07/07/17 0808 07/08/17 0518 07/11/17 0650  NA 136 139 134*  K 4.3 4.2 4.1  CL 92* 95* 94*  CO2 30 30 28   GLUCOSE 102* 112* 102*  BUN 20 24* 30*  CREATININE 6.87* 8.03* 10.50*  ALBUMIN 3.7  --  3.7  CALCIUM 8.8* 9.1 9.2  PHOS  --   --  4.8*   CBC Recent Labs  Lab 07/07/17 0808 07/08/17 0518 07/11/17 0650  WBC 8.0 7.1 9.8  NEUTROABS 3.6  --   --   HGB 8.3* 8.7* 9.3*  HCT 26.3* 28.2* 30.0*  MCV 93.3 93.7 94.6  PLT 377 424* 464*    @IMGRELPRIORS @ Medications:    . aspirin EC  81 mg Oral Daily  . atorvastatin  80 mg Oral q1800  . calcitRIOL  0.5 mcg Oral Q M,W,F-HD  . clopidogrel  75 mg Oral Daily  . heparin injection (subcutaneous)  5,000 Units Subcutaneous Q8H  . insulin aspart  0-9 Units Subcutaneous TID WC  . latanoprost  1 drop Both Eyes Daily  . metoprolol tartrate  12.5 mg Oral BID  . pantoprazole  40 mg Oral Q0600  . sertraline  50 mg Oral QHS  . sevelamer carbonate  800 mg Oral TID WC   Dialysis Orders: [facility] 4 hoursEDW 98 kg MWF  HD Bath2K/ 2 Ca, DialyzerF180 BFR 400 mL/ min, Heparin5000 u bolus. AccessLUE AVG. -Mircera200 q 2 wks (last 5/1)  -Venofer50 q wk  -Calcitriol 0.5TIW  Assessment/ Plan:   1. Atypical chest pain- at site of AICD in left mid-axillary region. 2. ESRD cont with HD qMWF 3. Anemia: Hgb improving. 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 Nutrition: renal  diet 6. Hypertension: stable, will need to change edw as he is below normal edw. (will need standing weight to make sure post weight today) 7. DM per primary svc 8. Depression 9. Disposition- lives along and SW consulted for placement.  No bed offers at this time.    Irena Cords, MD [facility], [facility] Pager [phone] 07/11/2017, 9:09 AM

## 2017-07-11 NOTE — Discharge Summary (Addendum)
Physician Discharge Summary  Richard Barnett CVK:[id number] DOB: Jul 27, 1955 DOA: 07/07/2017  PCP: [facility]  Admit date: 07/07/2017 Discharge date: 07/18/2017 Admitted From: home Disposition: snf Recommendations for Outpatient Follow-up:  1. Follow up with PCP in 1-2 weeks 2. Please obtain BMP/CBC in one week  Home Health:none Equipment/Devicesnone Discharge Conditionstable CODE STATUS:full Diet recommendation: renal diet Brief/Interim Summary: 62 y.o.malewithwith a history of CAD status post stenting in November 2018, and a recent admission for cardiac arrest, with admission from 4/19/2019through 07/04/2017, status post cardiac catheterization on 06/28/2017, showing diffuse disease in the LAD with 40 to 50% narrowing, subsequently placing an ICD on 06/29/2017, and discharged in stable condition. At the time, skilled nursing facility was recommended, but patient chose to be discharged to home. Since his discharge, the patient has been on and off experiencing substernal chest pain. However, today, he reports sudden onset of severe chest pain, with associated diaphoresis. This began this morning. He denies any syncope or presyncope. He denies any palpitations. He denies any associated shortness of breath, or cough. He denies any pleuritic chest pain. However, the pain is characterized as dull, worse with movements. In fact, the patient reports not wanting to move, due to the pain which appears to be reproducible. He was concerned at the time, that his pacemaker was malfunctioning, but this was clear after interrogation. The patient denies any nausea or vomiting, denies any abdominal pain. The patient still makes some urine, denies any dysuria or gross hematuria. He denies any lower extremity swelling or calf pain. The patient did not take anymedications to treat this pain. Time of evaluation, he describes it as 10 out of 10.Compliant with his medications, denies  tobacco, alcohol or recreational drug use.    Discharge Diagnoses:  Principal Problem:   Chest pain Active Problems:   CAD (coronary artery disease)   Hypertension   Hyperlipidemia   Prediabetes   Anemia of chronic renal failure   Secondary hyperparathyroidism of renal origin (HCC)   Arthritis   Depression   Deep vein thrombosis (HCC)   Diabetes mellitus (HCC)   NSTEMI (non-ST elevated myocardial infarction) (HCC)   GERD (gastroesophageal reflux disease)   ESRD on dialysis (HCC)   Major depressive disorder, recurrent episode, severe (HCC)   Adjustment disorder with mixed disturbance of emotions and conduct   Cardiac arrest (HCC)   Chest pain syndrome 1]atypical chest pain patient has pain at the site of incisions. Also has a incision on the left mid chest which is covered with dressings. That is where he is complaining of pain. VQ scan negative.  2]end-stage renal disease on dialysis 3 times a week.  3]hypertension stable continue current medications.  4]type 2 diabeteshe is not on any medicines for diabetes. Blood sugar seems to be stable.  5] status post cardiac arrest 419 and AICD 06/29/2017       Discharge Instructions  Discharge Instructions    Call MD for:  difficulty breathing, headache or visual disturbances   Complete by:  As directed    Call MD for:  persistant nausea and vomiting   Complete by:  As directed    Call MD for:  severe uncontrolled pain   Complete by:  As directed    Diet - low sodium heart healthy   Complete by:  As directed    Increase activity slowly   Complete by:  As directed      Allergies as of 07/11/2017      Reactions  Penicillins Anaphylaxis, Swelling, Rash   ANGIOEDEMA/"SWELLING OF ENTIRE BODY" Has patient had a PCN reaction causing immediate rash, facial/tongue/throat swelling, SOB or lightheadedness with hypotension: Yes Has patient had a PCN reaction causing severe rash involving mucus membranes or skin  necrosis: No Has patient had a PCN reaction that required hospitalization: No Has patient had a PCN reaction occurring within the last 10 years: No If all of the above answers are "NO", then may proceed with Cephalosporin use.      Medication List    STOP taking these medications   oxyCODONE 5 MG immediate release tablet Commonly known as:  Oxy IR/ROXICODONE     TAKE these medications   aspirin 81 MG EC tablet Take 1 tablet (81 mg total) by mouth daily. Swallow whole.   atorvastatin 80 MG tablet Commonly known as:  LIPITOR Take 1 tablet (80 mg total) by mouth daily at 6 PM.   calcitRIOL 0.5 MCG capsule Commonly known as:  ROCALTROL Take 1 capsule (0.5 mcg total) by mouth every Monday, Wednesday, and Friday with hemodialysis.   clopidogrel 75 MG tablet Commonly known as:  PLAVIX Take 1 tablet (75 mg total) by mouth daily.   latanoprost 0.005 % ophthalmic solution Commonly known as:  XALATAN Place 1 drop into both eyes daily.   lidocaine (PF) 1 % Soln injection Commonly known as:  XYLOCAINE Inject 5 mLs into the skin as needed (topical anesthesia for hemodialysis ifGEBAUERS is ineffective.).   lidocaine-prilocaine cream Commonly known as:  EMLA Apply 1 application topically as needed (topical anesthesia for hemodialysis if Gebauers and Lidocaine injection are ineffective.).   metoprolol tartrate 25 MG tablet Commonly known as:  LOPRESSOR Take 0.5 tablets (12.5 mg total) by mouth 2 (two) times daily.   nitroGLYCERIN 0.4 MG SL tablet Commonly known as:  NITROSTAT DISSOLVE 1 TABLET UNDER THE TONGUE AS NEEDED FOR CHEST PAIN. REPEAT AS NEEDED EVERY 5 MINUTES UP TO A TOTAL OF 3 DOSES What changed:  See the new instructions.   pentafluoroprop-tetrafluoroeth Aero Commonly known as:  GEBAUERS Apply 1 application topically as needed (topical anesthesia for hemodialysis).   RENAGEL 800 MG tablet Generic drug:  sevelamer Take 1,600 mg by mouth 3 (three) times daily with  meals.   senna-docusate 8.6-50 MG tablet Commonly known as:  Senokot-S Take 1 tablet by mouth at bedtime as needed for mild constipation.   sertraline 50 MG tablet Commonly known as:  ZOLOFT Take 1 tablet (50 mg total) by mouth at bedtime.   SIMBRINZA 1-0.2 % Susp Generic drug:  Brinzolamide-Brimonidine Place 1 drop into both eyes 2 (two) times daily.      Follow-up Information    [facility] Follow up.   Specialty:  Family Medicine Contact information: [address] [place] [address] [phone]        Delano Metz, MD Follow up.   Specialty:  Nephrology Contact information: [address] [place] [address] [phone]          Allergies  Allergen Reactions  . Penicillins Anaphylaxis, Swelling and Rash    ANGIOEDEMA/"SWELLING OF ENTIRE BODY" Has patient had a PCN reaction causing immediate rash, facial/tongue/throat swelling, SOB or lightheadedness with hypotension: Yes Has patient had a PCN reaction causing severe rash involving mucus membranes or skin necrosis: No Has patient had a PCN reaction that required hospitalization: No Has patient had a PCN reaction occurring within the last 10 years: No If all of the above answers  are "NO", then may proceed with Cephalosporin use.     Consultations: Nephrology Procedures/Studies: Dg Chest 2 View  Result Date: 07/07/2017 CLINICAL DATA:  Mid chest pain, started this AM. Pt was post cardiac arrest on 4/19, pacemaker placed. EXAM: CHEST - 2 VIEW COMPARISON:  06/30/2017 FINDINGS: Lateral view degraded by patient arm position. Numerous leads and wires project over the chest. Midline trachea. Mild cardiomegaly. No pleural effusion or pneumothorax. No congestive failure. Clear lungs. IMPRESSION: Mild cardiomegaly, without acute disease. Electronically Signed   By: Jeronimo Greaves M.D.   On: 07/07/2017 08:58   Ct Head Wo Contrast  Result Date: 06/24/2017 CLINICAL DATA:  Status  post cardiac arrest. Patient arrived from dialysis unresponsive and pulseless. Now having seizure-like activities. EXAM: CT HEAD WITHOUT CONTRAST TECHNIQUE: Contiguous axial images were obtained from the base of the skull through the vertex without intravenous contrast. COMPARISON:  12/23/2016 head CT FINDINGS: Brain: Chronic moderate small vessel ischemic disease with chronic left basal ganglial lacunar infarct. No acute intracranial hemorrhage, midline shift or edema. Redemonstration of mild to moderate ventriculomegaly. No intra-axial mass nor extra-axial fluid collections. Midline fourth ventricle and basal cisterns. No effacement. No large vascular territory infarct. Vascular: No hyperdense vessel sign.  No unexpected calcifications. Skull: Intact bony calvarium.  Small left mastoid effusion. Sinuses/Orbits: Mild ethmoid sinus mucosal thickening. No air-fluid levels. Intact orbits and globes. Other: None IMPRESSION: Redemonstration of chronic moderate small vessel ischemic disease and left basal ganglial lacunar infarct. Central atrophy. No acute intracranial abnormality. Electronically Signed   By: Tollie Eth M.D.   On: 06/24/2017 18:30   Nm Pulmonary Vent And Perf (v/q Scan)  Result Date: 07/07/2017 CLINICAL DATA:  PE suspected, high pretest probability. Chest and back pain. EXAM: NUCLEAR MEDICINE VENTILATION - PERFUSION LUNG SCAN TECHNIQUE: Ventilation images were obtained in multiple projections using inhaled aerosol Tc-48m DTPA. Perfusion images were obtained in multiple projections after intravenous injection of Tc-64m-MAA. RADIOPHARMACEUTICALS:  31 mCi of Tc-54m DTPA aerosol inhalation and 4.2 mCi Tc65m-MAA IV COMPARISON:  Chest radiograph from earlier today. FINDINGS: Matched defect on the left lateral image correlates with a defibrillator battery pack on chest x-ray. No true parenchymal perfusion or ventilation defect is seen. IMPRESSION: Negative for pulmonary embolism. Electronically Signed   By:  Marnee Spring M.D.   On: 07/07/2017 14:32   Dg Chest Port 1 View  Result Date: 06/30/2017 CLINICAL DATA:  Worsening chest pain after ICD. EXAM: PORTABLE CHEST 1 VIEW COMPARISON:  Chest radiograph June 24, 2017 FINDINGS: Cardiac silhouette is mildly enlarged, improved in appearance from prior examination. Mediastinal silhouette is nonsuspicious. No pleural effusion or focal consolidation. LEFT cardiac defibrillator with lead tip projecting midline. LEFT chest wall subcutaneous emphysema consistent with recent placement of ICD. IMPRESSION: Mild cardiomegaly.  No acute pulmonary process. Electronically Signed   By: Awilda Metro M.D.   On: 06/30/2017 05:22   Dg Chest Portable 1 View  Result Date: 06/24/2017 CLINICAL DATA:  Status post cardiac arrest EXAM: PORTABLE CHEST 1 VIEW COMPARISON:  05/13/2017 FINDINGS: Cardiac shadow is prominent but accentuated by the portable technique. Lungs are well aerated bilaterally. No focal infiltrate or pneumothorax is seen. No acute bony abnormality is noted. IMPRESSION: No acute abnormality noted. Electronically Signed   By: Alcide Clever M.D.   On: 06/24/2017 18:10   (Echo, Carotid, EGD, Colonoscopy, ERCP)    Subjective:   Discharge Exam: Vitals:   07/11/17 0900 07/11/17 0930  BP: (!) 153/95 (!) 142/98  Pulse: 81 88  Resp:  Temp:    SpO2:     Vitals:   07/11/17 0800 07/11/17 0830 07/11/17 0900 07/11/17 0930  BP: (!) 154/85 (!) 135/92 (!) 153/95 (!) 142/98  Pulse: 77 78 81 88  Resp: 17     Temp:      TempSrc:      SpO2:      Weight:      Height:        General: Pt is alert, awake, not in acute distress Cardiovascular: RRR, S1/S2 +, no rubs, no gallops Respiratory: CTA bilaterally, no wheezing, no rhonchi Abdominal: Soft, NT, ND, bowel sounds + Extremities: no edema, no cyanosis    The results of significant diagnostics from this hospitalization (including imaging, microbiology, ancillary and laboratory) are listed below for  reference.     Microbiology: No results found for this or any previous visit (from the past 240 hour(s)).   Labs: BNP (last 3 results) Recent Labs    08/10/16 0846 05/13/17 1845 07/07/17 0808  BNP 25.2 28.1 69.3   Basic Metabolic Panel: Recent Labs  Lab 07/07/17 0808 07/08/17 0518 07/11/17 0650  NA 136 139 134*  K 4.3 4.2 4.1  CL 92* 95* 94*  CO2 30 30 28   GLUCOSE 102* 112* 102*  BUN 20 24* 30*  CREATININE 6.87* 8.03* 10.50*  CALCIUM 8.8* 9.1 9.2  PHOS  --   --  4.8*   Liver Function Tests: Recent Labs  Lab 07/07/17 0808 07/11/17 0650  AST 113*  --   ALT 147*  --   ALKPHOS 216*  --   BILITOT 0.6  --   PROT 6.9  --   ALBUMIN 3.7 3.7   No results for input(s): LIPASE, AMYLASE in the last 168 hours. No results for input(s): AMMONIA in the last 168 hours. CBC: Recent Labs  Lab 07/07/17 0808 07/08/17 0518 07/11/17 0650  WBC 8.0 7.1 9.8  NEUTROABS 3.6  --   --   HGB 8.3* 8.7* 9.3*  HCT 26.3* 28.2* 30.0*  MCV 93.3 93.7 94.6  PLT 377 424* 464*   Cardiac Enzymes: Recent Labs  Lab 07/07/17 0808 07/07/17 1456 07/08/17 0518 07/08/17 1237  TROPONINI 0.03* 0.03* <0.03 <0.03   BNP: Invalid input(s): POCBNP CBG: Recent Labs  Lab 07/09/17 2150 07/10/17 0735 07/10/17 1140 07/10/17 1626 07/10/17 2106  GLUCAP 149* 117* 128* 103* 101*   D-Dimer No results for input(s): DDIMER in the last 72 hours. Hgb A1c No results for input(s): HGBA1C in the last 72 hours. Lipid Profile No results for input(s): CHOL, HDL, LDLCALC, TRIG, CHOLHDL, LDLDIRECT in the last 72 hours. Thyroid function studies No results for input(s): TSH, T4TOTAL, T3FREE, THYROIDAB in the last 72 hours.  Invalid input(s): FREET3 Anemia work up No results for input(s): VITAMINB12, FOLATE, FERRITIN, TIBC, IRON, RETICCTPCT in the last 72 hours. Urinalysis    Component Value Date/Time   COLORURINE YELLOW 07/07/2017 2252   APPEARANCEUR CLEAR 07/07/2017 2252   LABSPEC 1.009 07/07/2017  2252   PHURINE 9.0 (H) 07/07/2017 2252   GLUCOSEU NEGATIVE 07/07/2017 2252   HGBUR NEGATIVE 07/07/2017 2252   BILIRUBINUR NEGATIVE 07/07/2017 2252   KETONESUR NEGATIVE 07/07/2017 2252   PROTEINUR >=300 (A) 07/07/2017 2252   UROBILINOGEN 0.2 02/21/2015 0955   NITRITE NEGATIVE 07/07/2017 2252   LEUKOCYTESUR NEGATIVE 07/07/2017 2252   Sepsis Labs Invalid input(s): PROCALCITONIN,  WBC,  LACTICIDVEN Microbiology No results found for this or any previous visit (from the past 240 hour(s)).   Time coordinating discharge: Over 39  minutes  SIGNED:   Alwyn Ren, MD  [facility] 07/11/2017, 9:54 AM Pager   If 7PM-7AM, please contact night-coverage [email or web address] Password [email or web address]

## 2017-07-11 NOTE — Progress Notes (Addendum)
3:38 pm [facility] has reviewed patient's referral and will not offer a bed. Patient does not have any bed offers. CSW to consult with clinical supervisor on placement for patient. CSW sent out additional referrals outside of [place]. Will need to arrange transportation to patient's dialysis center in [place] if placed out of town, or switch patient to a different center. CSW to follow.  10:12 am Patient does not have any SNF bed offers. CSW awaiting call back from [facility] to confirm their Medicaid bed availability. CSW to follow and support with discharge when bed available.  Abigail Butts, LCSWA [phone]

## 2017-07-12 ENCOUNTER — Ambulatory Visit: Payer: Self-pay

## 2017-07-12 ENCOUNTER — Encounter: Payer: Self-pay | Admitting: *Deleted

## 2017-07-12 LAB — GLUCOSE, CAPILLARY
GLUCOSE-CAPILLARY: 114 mg/dL — AB (ref 65–99)
GLUCOSE-CAPILLARY: 126 mg/dL — AB (ref 65–99)
Glucose-Capillary: 131 mg/dL — ABNORMAL HIGH (ref 65–99)
Glucose-Capillary: 137 mg/dL — ABNORMAL HIGH (ref 65–99)

## 2017-07-12 NOTE — Progress Notes (Signed)
CSW continuing to await bed offers from [facility] and [facility] - have left messages for admissions at these facilities. CSW to follow up with the facilities again tomorrow and support with discharge when bed identified.  Abigail Butts, LCSWA [phone]

## 2017-07-12 NOTE — Progress Notes (Addendum)
[facility] Progress Note   Subjective: No C/Os. Eating lunch, barely stopping eating to talk. No C/O chest pain at present.   Objective Vitals:   07/11/17 1046 07/11/17 1505 07/11/17 2148 07/12/17 0401  BP:  113/84 113/77 113/74  Pulse:  89 84 85  Resp:   16 16  Temp:  98.6 F (37 C) 98.9 F (37.2 C) 98.5 F (36.9 C)  TempSrc:  Oral Oral Oral  SpO2:  100% 99% 99%  Weight: 91.8 kg (202 lb 6.1 oz)   92.9 kg (204 lb 11.2 oz)  Height:       Physical Exam General: WN,WD NAD Heart:S1,S2 No M/G/R Lungs: CTAB A/P Abdomen: active BS Extremities: No LE edema Dialysis Access: LUA AVG + bruit   Additional Objective Labs: Basic Metabolic Panel: Recent Labs  Lab 07/07/17 0808 07/08/17 0518 07/11/17 0650  NA 136 139 134*  K 4.3 4.2 4.1  CL 92* 95* 94*  CO2 30 30 28   GLUCOSE 102* 112* 102*  BUN 20 24* 30*  CREATININE 6.87* 8.03* 10.50*  CALCIUM 8.8* 9.1 9.2  PHOS  --   --  4.8*   Liver Function Tests: Recent Labs  Lab 07/07/17 0808 07/11/17 0650  AST 113*  --   ALT 147*  --   ALKPHOS 216*  --   BILITOT 0.6  --   PROT 6.9  --   ALBUMIN 3.7 3.7   No results for input(s): LIPASE, AMYLASE in the last 168 hours. CBC: Recent Labs  Lab 07/07/17 0808 07/08/17 0518 07/11/17 0650  WBC 8.0 7.1 9.8  NEUTROABS 3.6  --   --   HGB 8.3* 8.7* 9.3*  HCT 26.3* 28.2* 30.0*  MCV 93.3 93.7 94.6  PLT 377 424* 464*   Blood Culture    Component Value Date/Time   SDES BLOOD RIGHT FOOT 06/24/2017 2255   SPECREQUEST  06/24/2017 2255    BOTTLES DRAWN AEROBIC ONLY Blood Culture results may not be optimal due to an inadequate volume of blood received in culture bottles   CULT  06/24/2017 2255    NO GROWTH 5 DAYS Performed at [facility], [address] [place], [place] [address]    REPTSTATUS 06/30/2017 FINAL 06/24/2017 2255    Cardiac Enzymes: Recent Labs  Lab 07/07/17 0808 07/07/17 1456 07/08/17 0518 07/08/17 1237  TROPONINI 0.03* 0.03* <0.03  <0.03   CBG: Recent Labs  Lab 07/11/17 1202 07/11/17 1610 07/11/17 2144 07/12/17 0755 07/12/17 1055  GLUCAP 169* 109* 116* 114* 131*   Iron Studies: No results for input(s): IRON, TIBC, TRANSFERRIN, FERRITIN in the last 72 hours. @lablastinr3 @ Studies/Results: No results found. Medications:  . aspirin EC  81 mg Oral Daily  . atorvastatin  80 mg Oral q1800  . calcitRIOL  0.5 mcg Oral Q M,W,F-HD  . clopidogrel  75 mg Oral Daily  . heparin injection (subcutaneous)  5,000 Units Subcutaneous Q8H  . insulin aspart  0-9 Units Subcutaneous TID WC  . latanoprost  1 drop Both Eyes Daily  . metoprolol tartrate  12.5 mg Oral BID  . pantoprazole  40 mg Oral Q0600  . sertraline  50 mg Oral QHS  . sevelamer carbonate  800 mg Oral TID WC   Dialysis Orders: [facility] 4 hoursEDW 98 kg MWF  HD Bath2K/ 2 Ca, DialyzerF180 BFR 400 mL/ min, Heparin5000 u bolus. AccessLUE AVG. -Mircera200 q 2 wks (last 5/1)  -Venofer50 q wk  -Calcitriol 0.5TIW  Assessment/Plan: 1. Atypical chest pain- at site  of AICD in left mid-axillary region. Device/implant wound check per EP today. No issues. Troponin 0.03 flat trend.  2. ESRD MWF via LUA AVG. K+ 4.1. Usual heparin dose.  3. Anemia: Hgb ^ 9.3. Last ESA 07/06/17. Monitor.  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 Nutrition: renal diet 6. Hypertension: HD 07/11/17 Pre wt 94.6 Net UF 3 liters Post wt 91.8. Drastically below OP EDW. Get standing wt next tx. BP controlled on low side. UFG 1-1.5 liters with HD tomorrow. Probably need lower EDW on DC.  7. DM per primary svc 8. Depression 9. Disposition- lives alone and SW consulted for placement.  No bed offers at this time.      Rita H. Brown NP-C 07/12/2017, 12:26 PM  [facility] [phone]   I have seen and examined this patient and agree with plan and assessment in the above note with renal recommendations/intervention highlighted.  No new complaints today and  tolerated HD well.  Jomarie Longs A Tawnia Schirm,MD 07/12/2017 2:12 PM

## 2017-07-12 NOTE — Progress Notes (Addendum)
Patient is due for routine post implant wound/device check visit. Left infra-axillary device site steri-strips were removed, skin edges are approximated and healing well, no drainage.   When Left arm is raised device/area are more pronounced, though with arm down I do not appreciate any swelling or obvious hematoma. No erythema or increased heat to the surrounding tissues. Patient reports mild tenderness, unable to lay on his left side comfortably.  Educated that this will resolve with time.  Sternal wounds remain with tegaderm in place, these and steri-strips were removed from both sites.  Skin edges are approximated and healing well, both at superior and inferior wounds.  No swelling,/hematoma, no erythema or increased heat to the surrounding tissues.  No pain is reported   [employer] rep will check device as part of routine post-implant check.  Francis Dowse, PA-C

## 2017-07-12 NOTE — Progress Notes (Signed)
Physical Therapy Treatment Patient Details Name: Richard Barnett MRN: [id number] DOB: May 15, 1955 Today's Date: 07/12/2017    History of Present Illness  Richard Barnett is a 62 y.o. Richard Barnett with with a history of CAD status post stenting in November 2018, and a recent admission for cardiac arrest, with admission from 06/24/2017 through 07/04/2017, status post cardiac catheterization on 06/28/2017, showing diffuse disease in the LAD with 40 to 50% narrowing, subsequently placing an ICD on 06/29/2017, and discharged in stable condition.  At the time, skilled nursing facility was recommended, but patient chose to be discharged to home.  Admitted with chest pain.    PT Comments    Pt making steady progress. Continue to recommend ST-SNF.   Follow Up Recommendations  SNF;Supervision/Assistance - 24 hour     Equipment Recommendations  None recommended by PT    Recommendations for Other Services       Precautions / Restrictions Precautions Precautions: Fall;ICD/Pacemaker Restrictions Weight Bearing Restrictions: No Other Position/Activity Restrictions: No pushing, pulling, lifting    Mobility  Bed Mobility Overal bed mobility: Needs Assistance Bed Mobility: Supine to Sit     Supine to sit: Supervision   Sit to sidelying: Supervision General bed mobility comments: supervision for safety  Transfers Overall transfer level: Needs assistance Equipment used: None Transfers: Sit to/from Stand Sit to Stand: Supervision         General transfer comment: supervision for safety  Ambulation/Gait Ambulation/Gait assistance: Min guard   Assistive device: None Gait Pattern/deviations: Step-through pattern;Decreased stride length Gait velocity: decr Gait velocity interpretation: 1.31 - 2.62 ft/sec, indicative of limited community ambulator General Gait Details: Slightly unsteady gait with assist for balance and safety   Stairs             Wheelchair Mobility    Modified Rankin  (Stroke Patients Only)       Balance Overall balance assessment: Needs assistance Sitting-balance support: Feet supported Sitting balance-Leahy Scale: Good     Standing balance support: No upper extremity supported Standing balance-Leahy Scale: Fair                              Cognition Arousal/Alertness: Awake/alert Behavior During Therapy: WFL for tasks assessed/performed Overall Cognitive Status: No family/caregiver present to determine baseline cognitive functioning Area of Impairment: Memory;Problem solving;Safety/judgement                   Current Attention Level: Selective Memory: Decreased short-term memory;Decreased recall of precautions   Safety/Judgement: Decreased awareness of safety;Decreased awareness of deficits   Problem Solving: Slow processing;Requires verbal cues        Exercises      General Comments        Pertinent Vitals/Pain Pain Assessment: No/denies pain    Home Living                      Prior Function            PT Goals (current goals can now be found in the care plan section) Progress towards PT goals: Progressing toward goals    Frequency    Min 3X/week      PT Plan Current plan remains appropriate    Co-evaluation              AM-PAC PT "6 Clicks" Daily Activity  Outcome Measure  Difficulty turning over in bed (including adjusting bedclothes, sheets and blankets)?: A Little Difficulty  moving from lying on back to sitting on the side of the bed? : A Little Difficulty sitting down on and standing up from a chair with arms (e.g., wheelchair, bedside commode, etc,.)?: A Little Help needed moving to and from a bed to chair (including a wheelchair)?: A Little Help needed walking in hospital room?: A Little Help needed climbing 3-5 steps with a railing? : Total 6 Click Score: 16    End of Session   Activity Tolerance: Patient tolerated treatment well Patient left: with call  bell/phone within reach;in bed Nurse Communication: Mobility status(nausea) PT Visit Diagnosis: Other abnormalities of gait and mobility (R26.89);Difficulty in walking, not elsewhere classified (R26.2);Muscle weakness (generalized) (M62.81)     Time: 5284-1324 PT Time Calculation (min) (ACUTE ONLY): 9 min  Charges:  $Gait Training: 8-22 mins                    G Codes:       [facility] PT [phone]    Angelina Ok [facility] 07/12/2017, 2:15 PM

## 2017-07-12 NOTE — Progress Notes (Signed)
PROGRESS NOTE    Richard Barnett  JSE:[id number] DOB: 1955-05-17 DOA: 07/07/2017 PCP: Care, [facility] Kussma[facility]  Brief Narrative:  62 y.o.malewithwith a history of CAD status post stenting in November 2018, and a recent admission for cardiac arrest, with admission from 4/19/2019through 07/04/2017, status post cardiac catheterization on 06/28/2017, showing diffuse disease in the LAD with 40 to 50% narrowing, subsequently placing an ICD on 06/29/2017, and discharged in stable condition. At the time, skilled nursing facility was recommended, but patient chose to be discharged to home. Since his discharge, the patient has been on and off experiencing substernal chest pain. However, today, he reports sudden onset of severe chest pain, with associated diaphoresis. This began this morning. He denies any syncope or presyncope. He denies any palpitations. He denies any associated shortness of breath, or cough. He denies any pleuritic chest pain. However, the pain is characterized as dull, worse with movements. In fact, the patient reports not wanting to move, due to the pain which appears to be reproducible. He was concerned at the time, that his pacemaker was malfunctioning, but this was clear after interrogation. The patient denies any nausea or vomiting, denies any abdominal pain. The patient still makes some urine, denies any dysuria or gross hematuria. He denies any lower extremity swelling or calf pain. The patient did not take anymedications to treat this pain. Time of evaluation, he describes it as 10 out of 10.Compliant with his medications, denies tobacco, alcohol or recreational drug use.    Assessment & Plan:   Principal Problem:   Chest pain Active Problems:   CAD (coronary artery disease)   Hypertension   Hyperlipidemia   Prediabetes   Anemia of chronic renal failure   Secondary hyperparathyroidism of renal origin (HCC)   Arthritis   Depression   Deep vein  thrombosis (HCC)   Diabetes mellitus (HCC)   NSTEMI (non-ST elevated myocardial infarction) (HCC)   GERD (gastroesophageal reflux disease)   ESRD on dialysis (HCC)   Major depressive disorder, recurrent episode, severe (HCC)   Adjustment disorder with mixed disturbance of emotions and conduct   Cardiac arrest (HCC)   Chest pain syndrome  1]atypical chest pain patient has pain at the site of incisions. Also has a incision on the left mid chest which is covered with dressings. That is where he is complaining of pain. VQ scan negative.  2]end-stage renal disease on dialysis 3 times a week.  3]hypertension stable continue current medications.  4]type 2 diabeteshe is not on any medicines for diabetes. Blood sugar seems to be stable.  5] status post cardiac arrest 419 and AICD 06/29/2017      DVT prophylaxis:HEPARIN Code Status:FULL Family Communication: NONE Disposition Plan: AWAIT PLACEMENT  Consultants:  RENAL Procedures: NONE Antimicrobials:NONE  Subjective:NO COMPLANITS   Objective: Vitals:   07/11/17 1046 07/11/17 1505 07/11/17 2148 07/12/17 0401  BP:  113/84 113/77 113/74  Pulse:  89 84 85  Resp:   16 16  Temp:  98.6 F (37 C) 98.9 F (37.2 C) 98.5 F (36.9 C)  TempSrc:  Oral Oral Oral  SpO2:  100% 99% 99%  Weight: 91.8 kg (202 lb 6.1 oz)   92.9 kg (204 lb 11.2 oz)  Height:        Intake/Output Summary (Last 24 hours) at 07/12/2017 1002 Last data filed at 07/12/2017 0941 Gross per 24 hour  Intake 762 ml  Output 3000 ml  Net -2238 ml   Filed Weights   07/11/17 0640 07/11/17 1046  07/12/17 0401  Weight: 94.6 kg (208 lb 8.9 oz) 91.8 kg (202 lb 6.1 oz) 92.9 kg (204 lb 11.2 oz)    Examination:  General exam: Appears calm and comfortable  Respiratory system: Clear to auscultation. Respiratory effort normal. Cardiovascular system: S1 & S2 heard, RRR. No JVD, murmurs, rubs, gallops or clicks. No pedal edema. Gastrointestinal system: Abdomen is  nondistended, soft and nontender. No organomegaly or masses felt. Normal bowel sounds heard. Central nervous system: Alert and oriented. No focal neurological deficits. Extremities: Symmetric 5 x 5 power. Skin: No rashes, lesions or ulcers Psychiatry: Judgement and insight appear normal. Mood & affect appropriate.     Data Reviewed: I have personally reviewed following labs and imaging studies  CBC: Recent Labs  Lab 07/07/17 0808 07/08/17 0518 07/11/17 0650  WBC 8.0 7.1 9.8  NEUTROABS 3.6  --   --   HGB 8.3* 8.7* 9.3*  HCT 26.3* 28.2* 30.0*  MCV 93.3 93.7 94.6  PLT 377 424* 464*   Basic Metabolic Panel: Recent Labs  Lab 07/07/17 0808 07/08/17 0518 07/11/17 0650  NA 136 139 134*  K 4.3 4.2 4.1  CL 92* 95* 94*  CO2 30 30 28   GLUCOSE 102* 112* 102*  BUN 20 24* 30*  CREATININE 6.87* 8.03* 10.50*  CALCIUM 8.8* 9.1 9.2  PHOS  --   --  4.8*   GFR: Estimated Creatinine Clearance: 7.9 mL/min (A) (by C-G formula based on SCr of 10.5 mg/dL (H)). Liver Function Tests: Recent Labs  Lab 07/07/17 0808 07/11/17 0650  AST 113*  --   ALT 147*  --   ALKPHOS 216*  --   BILITOT 0.6  --   PROT 6.9  --   ALBUMIN 3.7 3.7   No results for input(s): LIPASE, AMYLASE in the last 168 hours. No results for input(s): AMMONIA in the last 168 hours. Coagulation Profile: No results for input(s): INR, PROTIME in the last 168 hours. Cardiac Enzymes: Recent Labs  Lab 07/07/17 0808 07/07/17 1456 07/08/17 0518 07/08/17 1237  TROPONINI 0.03* 0.03* <0.03 <0.03   BNP (last 3 results) No results for input(s): PROBNP in the last 8760 hours. HbA1C: No results for input(s): HGBA1C in the last 72 hours. CBG: Recent Labs  Lab 07/10/17 2106 07/11/17 1202 07/11/17 1610 07/11/17 2144 07/12/17 0755  GLUCAP 101* 169* 109* 116* 114*   Lipid Profile: No results for input(s): CHOL, HDL, LDLCALC, TRIG, CHOLHDL, LDLDIRECT in the last 72 hours. Thyroid Function Tests: No results for input(s):  TSH, T4TOTAL, FREET4, T3FREE, THYROIDAB in the last 72 hours. Anemia Panel: No results for input(s): VITAMINB12, FOLATE, FERRITIN, TIBC, IRON, RETICCTPCT in the last 72 hours. Sepsis Labs: No results for input(s): PROCALCITON, LATICACIDVEN in the last 168 hours.  No results found for this or any previous visit (from the past 240 hour(s)).       Radiology Studies: No results found.      Scheduled Meds: . aspirin EC  81 mg Oral Daily  . atorvastatin  80 mg Oral q1800  . calcitRIOL  0.5 mcg Oral Q M,W,F-HD  . clopidogrel  75 mg Oral Daily  . heparin injection (subcutaneous)  5,000 Units Subcutaneous Q8H  . insulin aspart  0-9 Units Subcutaneous TID WC  . latanoprost  1 drop Both Eyes Daily  . metoprolol tartrate  12.5 mg Oral BID  . pantoprazole  40 mg Oral Q0600  . sertraline  50 mg Oral QHS  . sevelamer carbonate  800 mg Oral TID WC  Continuous Infusions:   LOS: 5 days       Alwyn Ren, MD If 7PM-7AM, please contact night-coverage [email or web address] Password [email or web address] 07/12/2017, 10:02 AM

## 2017-07-12 NOTE — Progress Notes (Signed)
CSW following to support with SNF placement for patient. CSW has sent additional referrals to [facility], [address] in [place] and [place], [address], [address], and [facility] in [place]. Referral has been declined by all facilities except [facility] and [facility] - CSW continues to await bed offer from these facilities.   Abigail Butts, LCSWA [phone]

## 2017-07-12 NOTE — Progress Notes (Signed)
Routine S-ICD wound check performed during hospitalization by Francis Dowse, PA.  See her note for details.  Device check (performed by industry representative) is included below:

## 2017-07-13 LAB — RENAL FUNCTION PANEL
Albumin: 3.7 g/dL (ref 3.5–5.0)
Anion gap: 15 (ref 5–15)
BUN: 41 mg/dL — ABNORMAL HIGH (ref 6–20)
CO2: 26 mmol/L (ref 22–32)
Calcium: 9.1 mg/dL (ref 8.9–10.3)
Chloride: 93 mmol/L — ABNORMAL LOW (ref 101–111)
Creatinine, Ser: 11.39 mg/dL — ABNORMAL HIGH (ref 0.61–1.24)
GFR calc Af Amer: 5 mL/min — ABNORMAL LOW (ref >60)
GFR calc non Af Amer: 4 mL/min — ABNORMAL LOW (ref >60)
Glucose, Bld: 105 mg/dL — ABNORMAL HIGH (ref 65–99)
Phosphorus: 5.1 mg/dL — ABNORMAL HIGH (ref 2.5–4.6)
Potassium: 4.1 mmol/L (ref 3.5–5.1)
Sodium: 134 mmol/L — ABNORMAL LOW (ref 135–145)

## 2017-07-13 LAB — CBC
HCT: 30.3 % — ABNORMAL LOW (ref 39.0–52.0)
Hemoglobin: 9.4 g/dL — ABNORMAL LOW (ref 13.0–17.0)
MCH: 29.4 pg (ref 26.0–34.0)
MCHC: 31 g/dL (ref 30.0–36.0)
MCV: 94.7 fL (ref 78.0–100.0)
Platelets: 446 10*3/uL — ABNORMAL HIGH (ref 150–400)
RBC: 3.2 MIL/uL — ABNORMAL LOW (ref 4.22–5.81)
RDW: 17.3 % — ABNORMAL HIGH (ref 11.5–15.5)
WBC: 9.1 10*3/uL (ref 4.0–10.5)

## 2017-07-13 LAB — GLUCOSE, CAPILLARY
GLUCOSE-CAPILLARY: 147 mg/dL — AB (ref 65–99)
Glucose-Capillary: 148 mg/dL — ABNORMAL HIGH (ref 65–99)
Glucose-Capillary: 137 mg/dL — ABNORMAL HIGH (ref 65–99)

## 2017-07-13 MED ORDER — LIDOCAINE-PRILOCAINE 2.5-2.5 % EX CREA
1.0000 "application " | TOPICAL_CREAM | CUTANEOUS | Status: DC | PRN
  Filled 2017-07-13: qty 5

## 2017-07-13 MED ORDER — CALCITRIOL 0.5 MCG PO CAPS
ORAL_CAPSULE | ORAL | Status: AC
  Filled 2017-07-13: qty 1

## 2017-07-13 MED ORDER — HEPARIN SODIUM (PORCINE) 1000 UNIT/ML DIALYSIS
5000.0000 [IU] | Freq: Once | INTRAMUSCULAR | Status: DC
  Filled 2017-07-13: qty 5

## 2017-07-13 MED ORDER — SODIUM CHLORIDE 0.9 % IV SOLN: 100.0000 mL | INTRAVENOUS | Status: DC | PRN

## 2017-07-13 MED ORDER — LIDOCAINE HCL (PF) 1 % IJ SOLN: 5.0000 mL | INTRAMUSCULAR | Status: DC | PRN

## 2017-07-13 MED ORDER — PENTAFLUOROPROP-TETRAFLUOROETH EX AERO: 1.0000 "application " | INHALATION_SPRAY | CUTANEOUS | Status: DC | PRN

## 2017-07-13 NOTE — Progress Notes (Addendum)
12:37 [facility] has declined to offer bed. Patient has no bed offers at this time. CSW to continue to follow and will consult with clinical supervisor on disposition plan.  11:12 am [facility] does not have bed for patient. CSW following up again with [facility].  10:09 am CSW following up with SNFs again this morning for bed offer. [facility] has declined patient. CSW awaiting call back from [facility].  Abigail Butts, LCSWA [phone]

## 2017-07-13 NOTE — Progress Notes (Signed)
OT Cancellation Note  Patient Details Name: Richard Barnett MRN: [id number] DOB: [birth year]   Cancelled Treatment:    Reason Eval/Treat Not Completed: Patient at procedure or test/ unavailable. Pt at HD, will check back later as able/appropriate  Galen Manila 07/13/2017, 10:27 AM

## 2017-07-13 NOTE — Progress Notes (Signed)
PROGRESS NOTE    Richard Barnett  ZOX:[id number] DOB: 05-Nov-1955 DOA: 07/07/2017 PCP: Care, [facility]   Brief Narrative61 y.o.malewithwith a history of CAD status post stenting in November 2018, and a recent admission for cardiac arrest, with admission from 4/19/2019through 07/04/2017, status post cardiac catheterization on 06/28/2017, showing diffuse disease in the LAD with 40 to 50% narrowing, subsequently placing an ICD on 06/29/2017, and discharged in stable condition. At the time, skilled nursing facility was recommended, but patient chose to be discharged to home. Since his discharge, the patient has been on and off experiencing substernal chest pain. However, today, he reports sudden onset of severe chest pain, with associated diaphoresis. This began this morning. He denies any syncope or presyncope. He denies any palpitations. He denies any associated shortness of breath, or cough. He denies any pleuritic chest pain. However, the pain is characterized as dull, worse with movements. In fact, the patient reports not wanting to move, due to the pain which appears to be reproducible. He was concerned at the time, that his pacemaker was malfunctioning, but this was clear after interrogation. The patient denies any nausea or vomiting, denies any abdominal pain. The patient still makes some urine, denies any dysuria or gross hematuria. He denies any lower extremity swelling or calf pain. The patient did not take anymedications to treat this pain. Time of evaluation, he describes it as 10 out of 10.Compliant with his medications, denies tobacco, alcohol or recreational drug use.     Assessment & Plan:   Principal Problem:   Chest pain Active Problems:   CAD (coronary artery disease)   Hypertension   Hyperlipidemia   Prediabetes   Anemia of chronic renal failure   Secondary hyperparathyroidism of renal origin (HCC)   Arthritis   Depression   Deep vein  thrombosis (HCC)   Diabetes mellitus (HCC)   NSTEMI (non-ST elevated myocardial infarction) (HCC)   GERD (gastroesophageal reflux disease)   ESRD on dialysis (HCC)   Major depressive disorder, recurrent episode, severe (HCC)   Adjustment disorder with mixed disturbance of emotions and conduct   Cardiac arrest (HCC)   Chest pain syndrome  1]atypical chest pain patient has pain at the site of incisions. Also has a incision on the left mid chest which is covered with dressings. That is where he is complaining of pain. VQ scan negative.  2]end-stage renal disease on dialysis 3 times a week.  3]hypertension stable continue current medications.  4]type 2 diabeteshe is not on any medicines for diabetes. Blood sugar seems to be stable.  5]status post cardiac arrest 419 and AICD 06/29/2017     DVT prophylaxis:heparin Code Status:full Family Communication:none Disposition Plan:await placement Consultants: renal  Procedures:none Antimicrobials:  None Subjective: No complaints  Objective: Vitals:   07/13/17 0730 07/13/17 0800 07/13/17 0830 07/13/17 0900  BP: 121/80 126/82 123/73 (!) 142/76  Pulse: 76 77 79 79  Resp: 18 17 16 14   Temp:      TempSrc:      SpO2:      Weight:      Height:        Intake/Output Summary (Last 24 hours) at 07/13/2017 0934 Last data filed at 07/13/2017 0510 Gross per 24 hour  Intake 840 ml  Output 0 ml  Net 840 ml   Filed Weights   07/12/17 0401 07/13/17 0509 07/13/17 0700  Weight: 92.9 kg (204 lb 11.2 oz) 93.5 kg (206 lb 1.6 oz) 93.6 kg (206 lb 5.6 oz)  Examination:  General exam: Appears calm and comfortable  Respiratory system: Clear to auscultation. Respiratory effort normal. Cardiovascular system: S1 & S2 heard, RRR. No JVD, murmurs, rubs, gallops or clicks. No pedal edema. Gastrointestinal system: Abdomen is nondistended, soft and nontender. No organomegaly or masses felt. Normal bowel sounds heard. Central nervous  system: Alert and oriented. No focal neurological deficits. Extremities: Symmetric 5 x 5 power. Skin: No rashes, lesions or ulcers Psychiatry: Judgement and insight appear normal. Mood & affect appropriate.     Data Reviewed: I have personally reviewed following labs and imaging studies  CBC: Recent Labs  Lab 07/07/17 0808 07/08/17 0518 07/11/17 0650 07/13/17 0720  WBC 8.0 7.1 9.8 9.1  NEUTROABS 3.6  --   --   --   HGB 8.3* 8.7* 9.3* 9.4*  HCT 26.3* 28.2* 30.0* 30.3*  MCV 93.3 93.7 94.6 94.7  PLT 377 424* 464* 446*   Basic Metabolic Panel: Recent Labs  Lab 07/07/17 0808 07/08/17 0518 07/11/17 0650 07/13/17 0720  NA 136 139 134* 134*  K 4.3 4.2 4.1 4.1  CL 92* 95* 94* 93*  CO2 30 30 28 26   GLUCOSE 102* 112* 102* 105*  BUN 20 24* 30* 41*  CREATININE 6.87* 8.03* 10.50* 11.39*  CALCIUM 8.8* 9.1 9.2 9.1  PHOS  --   --  4.8* 5.1*   GFR: Estimated Creatinine Clearance: 7.3 mL/min (A) (by C-G formula based on SCr of 11.39 mg/dL (H)). Liver Function Tests: Recent Labs  Lab 07/07/17 0808 07/11/17 0650 07/13/17 0720  AST 113*  --   --   ALT 147*  --   --   ALKPHOS 216*  --   --   BILITOT 0.6  --   --   PROT 6.9  --   --   ALBUMIN 3.7 3.7 3.7   No results for input(s): LIPASE, AMYLASE in the last 168 hours. No results for input(s): AMMONIA in the last 168 hours. Coagulation Profile: No results for input(s): INR, PROTIME in the last 168 hours. Cardiac Enzymes: Recent Labs  Lab 07/07/17 0808 07/07/17 1456 07/08/17 0518 07/08/17 1237  TROPONINI 0.03* 0.03* <0.03 <0.03   BNP (last 3 results) No results for input(s): PROBNP in the last 8760 hours. HbA1C: No results for input(s): HGBA1C in the last 72 hours. CBG: Recent Labs  Lab 07/11/17 2144 07/12/17 0755 07/12/17 1055 07/12/17 1630 07/12/17 2117  GLUCAP 116* 114* 131* 137* 126*   Lipid Profile: No results for input(s): CHOL, HDL, LDLCALC, TRIG, CHOLHDL, LDLDIRECT in the last 72 hours. Thyroid  Function Tests: No results for input(s): TSH, T4TOTAL, FREET4, T3FREE, THYROIDAB in the last 72 hours. Anemia Panel: No results for input(s): VITAMINB12, FOLATE, FERRITIN, TIBC, IRON, RETICCTPCT in the last 72 hours. Sepsis Labs: No results for input(s): PROCALCITON, LATICACIDVEN in the last 168 hours.  No results found for this or any previous visit (from the past 240 hour(s)).       Radiology Studies: No results found.      Scheduled Meds: . aspirin EC  81 mg Oral Daily  . atorvastatin  80 mg Oral q1800  . calcitRIOL  0.5 mcg Oral Q M,W,F-HD  . clopidogrel  75 mg Oral Daily  . heparin injection (subcutaneous)  5,000 Units Subcutaneous Q8H  . heparin  5,000 Units Dialysis Once in dialysis  . insulin aspart  0-9 Units Subcutaneous TID WC  . latanoprost  1 drop Both Eyes Daily  . metoprolol tartrate  12.5 mg Oral BID  .  pantoprazole  40 mg Oral Q0600  . sertraline  50 mg Oral QHS  . sevelamer carbonate  800 mg Oral TID WC   Continuous Infusions: . sodium chloride    . sodium chloride       LOS: 6 days    Larna Daughters 7PM-7AM, please contact night-coverage [email or web address] Password [facility] 07/13/2017, 9:34 AM

## 2017-07-13 NOTE — Procedures (Signed)
I was present at this dialysis session. I have reviewed the session itself and made appropriate changes.   Filed Weights   07/12/17 0401 07/13/17 0509 07/13/17 0700  Weight: 92.9 kg (204 lb 11.2 oz) 93.5 kg (206 lb 1.6 oz) 93.6 kg (206 lb 5.6 oz)    Recent Labs  Lab 07/13/17 0720  NA 134*  K 4.1  CL 93*  CO2 26  GLUCOSE 105*  BUN 41*  CREATININE 11.39*  CALCIUM 9.1  PHOS 5.1*    Recent Labs  Lab 07/07/17 0808 07/08/17 0518 07/11/17 0650 07/13/17 0720  WBC 8.0 7.1 9.8 9.1  NEUTROABS 3.6  --   --   --   HGB 8.3* 8.7* 9.3* 9.4*  HCT 26.3* 28.2* 30.0* 30.3*  MCV 93.3 93.7 94.6 94.7  PLT 377 424* 464* 446*    Scheduled Meds: . aspirin EC  81 mg Oral Daily  . atorvastatin  80 mg Oral q1800  . calcitRIOL  0.5 mcg Oral Q M,W,F-HD  . clopidogrel  75 mg Oral Daily  . heparin injection (subcutaneous)  5,000 Units Subcutaneous Q8H  . heparin  5,000 Units Dialysis Once in dialysis  . insulin aspart  0-9 Units Subcutaneous TID WC  . latanoprost  1 drop Both Eyes Daily  . metoprolol tartrate  12.5 mg Oral BID  . pantoprazole  40 mg Oral Q0600  . sertraline  50 mg Oral QHS  . sevelamer carbonate  800 mg Oral TID WC   Continuous Infusions: . sodium chloride    . sodium chloride     PRN Meds:.sodium chloride, sodium chloride, acetaminophen, bisacodyl, gi cocktail, HYDROcodone-acetaminophen, lidocaine (PF), lidocaine-prilocaine, morphine injection, ondansetron **OR** ondansetron (ZOFRAN) IV, pentafluoroprop-tetrafluoroeth, senna-docusate    Dialysis Orders: [facility] 4 hoursEDW (was 98kg but has been 93kg in the hospital) MWF  HD Bath2K/ 2 Ca, DialyzerF180 BFR 400 mL/ min, Heparin5000 u bolus. AccessLUE AVG. -Mircera200 q 2 wks (last 5/1)  -Venofer50 q wk  -Calcitriol 0.5TIW  Assessment/Plan: 1. Atypical chest pain- at site of AICD in left mid-axillary region. Device/implant wound check per EP today. No issues. Troponin 0.03 flat trend.  2. ESRDMWF via LUA AVG.  Tolerating it well. Usual heparin dose.  3. Anemia:Hgb ^ 9.3. Last ESA 07/06/17. Monitor.  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 Nutrition:renal diet 6. Hypertension: Drastically below OP EDW. BP stable.  Will need standing post weight  7. DM per primary svc 8. Depression 9. Disposition- lives alone and SW consulted for placement. No bed offers at this time.      Irena Cords,  MD 07/13/2017, 8:50 AM

## 2017-07-14 LAB — GLUCOSE, CAPILLARY
GLUCOSE-CAPILLARY: 114 mg/dL — AB (ref 65–99)
Glucose-Capillary: 111 mg/dL — ABNORMAL HIGH (ref 65–99)
Glucose-Capillary: 117 mg/dL — ABNORMAL HIGH (ref 65–99)
Glucose-Capillary: 140 mg/dL — ABNORMAL HIGH (ref 65–99)

## 2017-07-14 NOTE — Plan of Care (Signed)
  Problem: Clinical Measurements: Goal: Will remain free from infection Outcome: Progressing Note:  No s/s of infection noted. Goal: Respiratory complications will improve Outcome: Progressing Note:  No s/s of respiratory complications.   Problem: Activity: Goal: Risk for activity intolerance will decrease Outcome: Progressing Note:  Ambulates in room without difficulty.

## 2017-07-14 NOTE — Progress Notes (Signed)
  [facility] Progress Note    Subjective:   No complaints   Objective:   BP 125/82   Pulse 90   Temp 98.3 F (36.8 C) (Oral)   Resp 18   Ht 5\' 7"  (1.702 m)   Wt 92.2 kg (203 lb 4.8 oz)   SpO2 100%   BMI 31.84 kg/m   Intake/Output: I/O last 3 completed shifts: In: 720 [P.O.:720] Out: 2206 [Other:2206]   Intake/Output this shift:  Total I/O In: 240 [P.O.:240] Out: -  Weight change: -1.586 kg (-3 lb 8 oz)  Physical Exam: Gen: NAD CVS: no rub Resp: cta Abd: benign Ext: LUE AVG +T/B  Labs: BMET Recent Labs  Lab 07/08/17 0518 07/11/17 0650 07/13/17 0720  NA 139 134* 134*  K 4.2 4.1 4.1  CL 95* 94* 93*  CO2 30 28 26   GLUCOSE 112* 102* 105*  BUN 24* 30* 41*  CREATININE 8.03* 10.50* 11.39*  ALBUMIN  --  3.7 3.7  CALCIUM 9.1 9.2 9.1  PHOS  --  4.8* 5.1*   CBC Recent Labs  Lab 07/08/17 0518 07/11/17 0650 07/13/17 0720  WBC 7.1 9.8 9.1  HGB 8.7* 9.3* 9.4*  HCT 28.2* 30.0* 30.3*  MCV 93.7 94.6 94.7  PLT 424* 464* 446*    @IMGRELPRIORS @ Medications:    . aspirin EC  81 mg Oral Daily  . atorvastatin  80 mg Oral q1800  . calcitRIOL  0.5 mcg Oral Q M,W,F-HD  . clopidogrel  75 mg Oral Daily  . heparin injection (subcutaneous)  5,000 Units Subcutaneous Q8H  . insulin aspart  0-9 Units Subcutaneous TID WC  . latanoprost  1 drop Both Eyes Daily  . metoprolol tartrate  12.5 mg Oral BID  . pantoprazole  40 mg Oral Q0600  . sertraline  50 mg Oral QHS  . sevelamer carbonate  800 mg Oral TID WC   Dialysis Orders: [facility] 4 hoursEDW (was 98kg but has been 93kg or below in the hospital) MWF  HD Bath2K/ 2 Ca, DialyzerF180 BFR 400 mL/ min, Heparin5000 u bolus. AccessLUE AVG. -Mircera200 q 2 wks (last 5/1)  -Venofer50 q wk  -Calcitriol 0.5TIW  Assessment/ Plan:   1. Atypical chest pain- at site of AICD in left mid-axillary region.Device/implant wound check per EP. No issues. Troponin 0.03 flat trend. 2. ZMCEYEMVVK LUA AVG.  Tolerating it well. Usual heparin dose. 3. Anemia:Hgb^ 9.3.Last ESA 07/06/17. Monitor. 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 Nutrition:renal diet 6. Hypertension: Drastically below OP EDW. BP stable.  Will need standing post weight  7. DM per primary svc 8. Depression 9. Disposition- lives alone and SW consulted for placement. No bed offers at this time.Placement will be an issue since he also has ESRD.  Will need to get Nephrology to accept patient once he does get a bed offer.   Irena Cords, MD [facility], [facility] Pager [phone] 07/14/2017, 12:37 PM

## 2017-07-14 NOTE — Progress Notes (Addendum)
PROGRESS NOTE    Richard Barnett  ZOX:[id number] DOB: [birth year] DOA: 07/07/2017 PCP: [facility], [facility]   Brief Narrative:62 y.o.malewithwith a history of CAD status post stenting in November 2018, and a recent admission for cardiac arrest, with admission from 4/19/2019through 07/04/2017, status post cardiac catheterization on 06/28/2017, showing diffuse disease in the LAD with 40 to 50% narrowing, subsequently placing an ICD on 06/29/2017, and discharged in stable condition. At the time, skilled nursing facility was recommended, but patient chose to be discharged to home. Since his discharge, the patient has been on and off experiencing substernal chest pain. However, today, he reports sudden onset of severe chest pain, with associated diaphoresis. This began this morning. He denies any syncope or presyncope. He denies any palpitations. He denies any associated shortness of breath, or cough. He denies any pleuritic chest pain. However, the pain is characterized as dull, worse with movements. In fact, the patient reports not wanting to move, due to the pain which appears to be reproducible. He was concerned at the time, that his pacemaker was malfunctioning, but this was clear after interrogation. The patient denies any nausea or vomiting, denies any abdominal pain. The patient still makes some urine, denies any dysuria or gross hematuria. He denies any lower extremity swelling or calf pain. The patient did not take anymedications to treat this pain. Time of evaluation, he describes it as 10 out of 10.Compliant with his medications, denies tobacco, alcohol or recreational drug use.    Assessment & Plan:   Principal Problem:   Chest pain Active Problems:   CAD (coronary artery disease)   Hypertension   Hyperlipidemia   Prediabetes   Anemia of chronic renal failure   Secondary hyperparathyroidism of renal origin (HCC)   Arthritis   Depression   Deep vein thrombosis  (HCC)   Diabetes mellitus (HCC)   NSTEMI (non-ST elevated myocardial infarction) (HCC)   GERD (gastroesophageal reflux disease)   ESRD on dialysis (HCC)   Major depressive disorder, recurrent episode, severe (HCC)   Adjustment disorder with mixed disturbance of emotions and conduct   Cardiac arrest (HCC)   Chest pain syndrome 1]atypical chest pain patient has pain at the site of incisions. Also has a incision on the left mid chest which is covered with dressings. That is where he is complaining of pain. VQ scan negative.  2]end-stage renal disease on dialysis 3 times a week.  3]hypertension stable continue current medications.  4]type 2 diabeteshe is not on any medicines for diabetes. Blood sugar seems to be stable.  5]status post cardiac arrest 419 and AICD 06/29/2017      DVT prophylaxis: heparin Code Status: full Family Communication: none Consultants:  renal  Procedures: none Antimicrobials: none  Subjective: Anxious to be discharged   Objective: Vitals:   07/13/17 2033 07/14/17 0407 07/14/17 0410 07/14/17 0940  BP: 114/76  113/70 125/82  Pulse: 89  80 90  Resp: 17  18   Temp: 98 F (36.7 C)  98.3 F (36.8 C)   TempSrc: Oral  Oral   SpO2: 98%  100%   Weight:  92.2 kg (203 lb 4.8 oz)    Height:        Intake/Output Summary (Last 24 hours) at 07/14/2017 1318 Last data filed at 07/14/2017 1242 Gross per 24 hour  Intake 1060 ml  Output -  Net 1060 ml   Filed Weights   07/13/17 0700 07/13/17 1100 07/14/17 0407  Weight: 93.6 kg (206 lb 5.6 oz)  91.9 kg (202 lb 9.6 oz) 92.2 kg (203 lb 4.8 oz)    Examination:  General exam: Appears calm and comfortable  Respiratory system: Clear to auscultation. Respiratory effort normal. Cardiovascular system: S1 & S2 heard, RRR. No JVD, murmurs, rubs, gallops or clicks. No pedal edema. Gastrointestinal system: Abdomen is nondistended, soft and nontender. No organomegaly or masses felt. Normal bowel sounds  heard. Central nervous system: Alert and oriented. No focal neurological deficits. Extremities: Symmetric 5 x 5 power. Skin: No rashes, lesions or ulcers Psychiatry: Judgement and insight appear normal. Mood & affect appropriate.     Data Reviewed: I have personally reviewed following labs and imaging studies  CBC: Recent Labs  Lab 07/08/17 0518 07/11/17 0650 07/13/17 0720  WBC 7.1 9.8 9.1  HGB 8.7* 9.3* 9.4*  HCT 28.2* 30.0* 30.3*  MCV 93.7 94.6 94.7  PLT 424* 464* 446*   Basic Metabolic Panel: Recent Labs  Lab 07/08/17 0518 07/11/17 0650 07/13/17 0720  NA 139 134* 134*  K 4.2 4.1 4.1  CL 95* 94* 93*  CO2 30 28 26   GLUCOSE 112* 102* 105*  BUN 24* 30* 41*  CREATININE 8.03* 10.50* 11.39*  CALCIUM 9.1 9.2 9.1  PHOS  --  4.8* 5.1*   GFR: Estimated Creatinine Clearance: 7.3 mL/min (A) (by C-G formula based on SCr of 11.39 mg/dL (H)). Liver Function Tests: Recent Labs  Lab 07/11/17 0650 07/13/17 0720  ALBUMIN 3.7 3.7   No results for input(s): LIPASE, AMYLASE in the last 168 hours. No results for input(s): AMMONIA in the last 168 hours. Coagulation Profile: No results for input(s): INR, PROTIME in the last 168 hours. Cardiac Enzymes: Recent Labs  Lab 07/07/17 1456 07/08/17 0518 07/08/17 1237  TROPONINI 0.03* <0.03 <0.03   BNP (last 3 results) No results for input(s): PROBNP in the last 8760 hours. HbA1C: No results for input(s): HGBA1C in the last 72 hours. CBG: Recent Labs  Lab 07/13/17 1316 07/13/17 1607 07/13/17 2142 07/14/17 0740 07/14/17 1129  GLUCAP 148* 147* 137* 111* 117*   Lipid Profile: No results for input(s): CHOL, HDL, LDLCALC, TRIG, CHOLHDL, LDLDIRECT in the last 72 hours. Thyroid Function Tests: No results for input(s): TSH, T4TOTAL, FREET4, T3FREE, THYROIDAB in the last 72 hours. Anemia Panel: No results for input(s): VITAMINB12, FOLATE, FERRITIN, TIBC, IRON, RETICCTPCT in the last 72 hours. Sepsis Labs: No results for  input(s): PROCALCITON, LATICACIDVEN in the last 168 hours.  No results found for this or any previous visit (from the past 240 hour(s)).       Radiology Studies: No results found.      Scheduled Meds: . aspirin EC  81 mg Oral Daily  . atorvastatin  80 mg Oral q1800  . calcitRIOL  0.5 mcg Oral Q M,W,F-HD  . clopidogrel  75 mg Oral Daily  . heparin injection (subcutaneous)  5,000 Units Subcutaneous Q8H  . insulin aspart  0-9 Units Subcutaneous TID WC  . latanoprost  1 drop Both Eyes Daily  . metoprolol tartrate  12.5 mg Oral BID  . pantoprazole  40 mg Oral Q0600  . sertraline  50 mg Oral QHS  . sevelamer carbonate  800 mg Oral TID WC   Continuous Infusions:   LOS: 7 days     Alwyn Ren, MD [facility]  If 7PM-7AM, please contact night-coverage [email or web address] Password [email or web address] 07/14/2017, 1:18 PM

## 2017-07-14 NOTE — Progress Notes (Signed)
Physical Therapy Treatment Patient Details Name: Richard Barnett MRN: [id number] DOB: 09/26/1955 Today's Date: 07/14/2017    History of Present Illness  Richard Barnett is a 62 y.o. Manoj with with a history of CAD status post stenting in November 2018, and a recent admission for cardiac arrest, with admission from 06/24/2017 through 07/04/2017, status post cardiac catheterization on 06/28/2017, showing diffuse disease in the LAD with 40 to 50% narrowing, subsequently placing an ICD on 06/29/2017, and discharged in stable condition.  At the time, skilled nursing facility was recommended, but patient chose to be discharged to home.  Admitted with chest pain.    PT Comments    Pt continues to make good progress with mobility.   Follow Up Recommendations  SNF;Supervision/Assistance - 24 hour     Equipment Recommendations  None recommended by PT    Recommendations for Other Services       Precautions / Restrictions Precautions Precautions: Fall;ICD/Pacemaker Restrictions Weight Bearing Restrictions: No    Mobility  Bed Mobility               General bed mobility comments: Pt sitting EOB  Transfers Overall transfer level: Needs assistance Equipment used: None Transfers: Sit to/from Stand Sit to Stand: Supervision         General transfer comment: supervision for safety  Ambulation/Gait Ambulation/Gait assistance: Min guard Ambulation Distance (Feet): 200 Feet Assistive device: None Gait Pattern/deviations: Step-through pattern;Decreased stride length Gait velocity: decr Gait velocity interpretation: 1.31 - 2.62 ft/sec, indicative of limited community ambulator General Gait Details: Slightly unsteady gait with assist for balance and safety   Stairs             Wheelchair Mobility    Modified Rankin (Stroke Patients Only)       Balance Overall balance assessment: Needs assistance Sitting-balance support: Feet supported Sitting balance-Leahy Scale: Good     Standing balance support: No upper extremity supported Standing balance-Leahy Scale: Fair                              Cognition Arousal/Alertness: Awake/alert Behavior During Therapy: WFL for tasks assessed/performed Overall Cognitive Status: No family/caregiver present to determine baseline cognitive functioning Area of Impairment: Memory;Problem solving                     Memory: Decreased short-term memory;Decreased recall of precautions       Problem Solving: Slow processing;Requires verbal cues        Exercises      General Comments        Pertinent Vitals/Pain Pain Assessment: Faces Faces Pain Scale: Hurts little more Pain Location: lt flank Pain Descriptors / Indicators: Grimacing Pain Intervention(s): Monitored during session    Home Living                      Prior Function            PT Goals (current goals can now be found in the care plan section) Progress towards PT goals: Progressing toward goals    Frequency    Min 2X/week      PT Plan Current plan remains appropriate;Frequency needs to be updated    Co-evaluation              AM-PAC PT "6 Clicks" Daily Activity  Outcome Measure  Difficulty turning over in bed (including adjusting bedclothes, sheets and blankets)?: A Little Difficulty  moving from lying on back to sitting on the side of the bed? : A Little Difficulty sitting down on and standing up from a chair with arms (e.g., wheelchair, bedside commode, etc,.)?: A Little Help needed moving to and from a bed to chair (including a wheelchair)?: A Little Help needed walking in hospital room?: A Little Help needed climbing 3-5 steps with a railing? : A Lot 6 Click Score: 17    End of Session   Activity Tolerance: Patient tolerated treatment well Patient left: with call bell/phone within reach;in bed   PT Visit Diagnosis: Other abnormalities of gait and mobility (R26.89);Difficulty in walking,  not elsewhere classified (R26.2);Muscle weakness (generalized) (M62.81)     Time: 4742-5956 PT Time Calculation (min) (ACUTE ONLY): 8 min  Charges:  $Gait Training: 8-22 mins                    G Codes:       [facility] PT [phone]    Angelina Ok [facility] 07/14/2017, 4:54 PM

## 2017-07-14 NOTE — Progress Notes (Addendum)
2:35 pm [facility] has offered a bed for patient. CSW to coordinate patient transfer to outpatient dialysis center in [place]. Patient will need to be transferred to new dialysis before he can be discharged to the facility.   10:52 am CSW consulted with physician advisor regarding placement for patient. Faxed referral to [facility] and discussed patient's needs with admissions coordinator, Bjorn Loser. CSW awaiting bed offer from [facility].  Abigail Butts, Theresia Majors [phone]

## 2017-07-14 NOTE — Progress Notes (Signed)
CSW coordinating transfer to [facility] dialysis in [place], as patient has bed at [facility]. CSW has requested records from [facility] at [facility] to submit to [facility]. CSW to follow and support.  Abigail Butts, LCSWA [phone]

## 2017-07-15 LAB — RENAL FUNCTION PANEL
ALBUMIN: 3.9 g/dL (ref 3.5–5.0)
Anion gap: 16 — ABNORMAL HIGH (ref 5–15)
BUN: 43 mg/dL — AB (ref 6–20)
CO2: 26 mmol/L (ref 22–32)
CREATININE: 11.6 mg/dL — AB (ref 0.61–1.24)
Calcium: 9.1 mg/dL (ref 8.9–10.3)
Chloride: 94 mmol/L — ABNORMAL LOW (ref 101–111)
GFR calc Af Amer: 5 mL/min — ABNORMAL LOW (ref >60)
GFR, EST NON AFRICAN AMERICAN: 4 mL/min — AB (ref >60)
Glucose, Bld: 94 mg/dL (ref 65–99)
PHOSPHORUS: 6 mg/dL — AB (ref 2.5–4.6)
POTASSIUM: 4 mmol/L (ref 3.5–5.1)
Sodium: 136 mmol/L (ref 135–145)

## 2017-07-15 LAB — CBC
HEMATOCRIT: 31.9 % — AB (ref 39.0–52.0)
Hemoglobin: 10.2 g/dL — ABNORMAL LOW (ref 13.0–17.0)
MCH: 30 pg (ref 26.0–34.0)
MCHC: 32 g/dL (ref 30.0–36.0)
MCV: 93.8 fL (ref 78.0–100.0)
PLATELETS: 327 10*3/uL (ref 150–400)
RBC: 3.4 MIL/uL — ABNORMAL LOW (ref 4.22–5.81)
RDW: 17.6 % — AB (ref 11.5–15.5)
WBC: 6 10*3/uL (ref 4.0–10.5)

## 2017-07-15 LAB — GLUCOSE, CAPILLARY
GLUCOSE-CAPILLARY: 102 mg/dL — AB (ref 65–99)
GLUCOSE-CAPILLARY: 164 mg/dL — AB (ref 65–99)
Glucose-Capillary: 119 mg/dL — ABNORMAL HIGH (ref 65–99)

## 2017-07-15 MED ORDER — HEPARIN SODIUM (PORCINE) 1000 UNIT/ML DIALYSIS: 20.0000 [IU]/kg | INTRAMUSCULAR | Status: DC | PRN

## 2017-07-15 MED ORDER — CALCITRIOL 0.5 MCG PO CAPS
ORAL_CAPSULE | ORAL | Status: AC
  Filled 2017-07-15: qty 1

## 2017-07-15 NOTE — Progress Notes (Signed)
PROGRESS NOTE    Richard Barnett  BTC:[id number] DOB: 1955-06-22 DOA: 07/07/2017 PCP: [facility]   Brief Narrative::62 y.o.malewithwith a history of CAD status post stenting in November 2018, and a recent admission for cardiac arrest, with admission from 4/19/2019through 07/04/2017, status post cardiac catheterization on 06/28/2017, showing diffuse disease in the LAD with 40 to 50% narrowing, subsequently placing an ICD on 06/29/2017, and discharged in stable condition. At the time, skilled nursing facility was recommended, but patient chose to be discharged to home. Since his discharge, the patient has been on and off experiencing substernal chest pain. However, today, he reports sudden onset of severe chest pain, with associated diaphoresis. This began this morning. He denies any syncope or presyncope. He denies any palpitations. He denies any associated shortness of breath, or cough. He denies any pleuritic chest pain. However, the pain is characterized as dull, worse with movements. In fact, the patient reports not wanting to move, due to the pain which appears to be reproducible. He was concerned at the time, that his pacemaker was malfunctioning, but this was clear after interrogation. The patient denies any nausea or vomiting, denies any abdominal pain. The patient still makes some urine, denies any dysuria or gross hematuria. He denies any lower extremity swelling or calf pain. The patient did not take anymedications to treat this pain. Time of evaluation, he describes it as 10 out of 10.Compliant with his medications, denies tobacco, alcohol or recreational drug use.     Assessment & Plan:   Principal Problem:   Chest pain Active Problems:   CAD (coronary artery disease)   Hypertension   Hyperlipidemia   Prediabetes   Anemia of chronic renal failure   Secondary hyperparathyroidism of renal origin (HCC)   Arthritis   Depression   Deep vein  thrombosis (HCC)   Diabetes mellitus (HCC)   NSTEMI (non-ST elevated myocardial infarction) (HCC)   GERD (gastroesophageal reflux disease)   ESRD on dialysis (HCC)   Major depressive disorder, recurrent episode, severe (HCC)   Adjustment disorder with mixed disturbance of emotions and conduct   Cardiac arrest (HCC)   Chest pain syndrome  1]atypical chest pain patient has pain at the site of incisions. Also has a incision on the left mid chest which is covered with dressings. That is where he is complaining of pain. VQ scan negative.  2]end-stage renal disease on dialysis 3 times a week.  3]hypertension stable continue current medications.  4]type 2 diabeteshe is not on any medicines for diabetes. Blood sugar seems to be stable.  5]status post cardiac arrest 419 and AICD 06/29/2017       DVT prophylaxis:  Code Status:  Family Communication Disposition Plan:  Consultants:   renal  Procedures:none Antimicrobials none  Subjective: No new complaints  Objective: Vitals:   07/15/17 0630 07/15/17 0720 07/15/17 0739 07/15/17 0744  BP:  126/80 (!) 140/94 136/82  Pulse:  73 78 73  Resp:  15 16 17   Temp:  98.1 F (36.7 C)    TempSrc:  Oral    SpO2:  100%    Weight: 92.8 kg (204 lb 8 oz) 93 kg (205 lb 0.4 oz)    Height:        Intake/Output Summary (Last 24 hours) at 07/15/2017 0902 Last data filed at 07/14/2017 2100 Gross per 24 hour  Intake 940 ml  Output -  Net 940 ml   Filed Weights   07/14/17 0407 07/15/17 0630 07/15/17 0720  Weight:  92.2 kg (203 lb 4.8 oz) 92.8 kg (204 lb 8 oz) 93 kg (205 lb 0.4 oz)    Examination:  General exam: Appears calm and comfortable  Respiratory system: Clear to auscultation. Respiratory effort normal. Cardiovascular system: S1 & S2 heard, RRR. No JVD, murmurs, rubs, gallops or clicks. No pedal edema. Gastrointestinal system: Abdomen is nondistended, soft and nontender. No organomegaly or masses felt. Normal bowel  sounds heard. Central nervous system: Alert and oriented. No focal neurological deficits. Extremities: Symmetric 5 x 5 power. Skin: No rashes, lesions or ulcers Psychiatry: Judgement and insight appear normal. Mood & affect appropriate.     Data Reviewed: I have personally reviewed following labs and imaging studies  CBC: Recent Labs  Lab 07/11/17 0650 07/13/17 0720 07/15/17 0621  WBC 9.8 9.1 6.0  HGB 9.3* 9.4* 10.2*  HCT 30.0* 30.3* 31.9*  MCV 94.6 94.7 93.8  PLT 464* 446* 327   Basic Metabolic Panel: Recent Labs  Lab 07/11/17 0650 07/13/17 0720  NA 134* 134*  K 4.1 4.1  CL 94* 93*  CO2 28 26  GLUCOSE 102* 105*  BUN 30* 41*  CREATININE 10.50* 11.39*  CALCIUM 9.2 9.1  PHOS 4.8* 5.1*   GFR: Estimated Creatinine Clearance: 7.3 mL/min (A) (by C-G formula based on SCr of 11.39 mg/dL (H)). Liver Function Tests: Recent Labs  Lab 07/11/17 0650 07/13/17 0720  ALBUMIN 3.7 3.7   No results for input(s): LIPASE, AMYLASE in the last 168 hours. No results for input(s): AMMONIA in the last 168 hours. Coagulation Profile: No results for input(s): INR, PROTIME in the last 168 hours. Cardiac Enzymes: Recent Labs  Lab 07/08/17 1237  TROPONINI <0.03   BNP (last 3 results) No results for input(s): PROBNP in the last 8760 hours. HbA1C: No results for input(s): HGBA1C in the last 72 hours. CBG: Recent Labs  Lab 07/13/17 2142 07/14/17 0740 07/14/17 1129 07/14/17 1604 07/14/17 2102  GLUCAP 137* 111* 117* 114* 140*   Lipid Profile: No results for input(s): CHOL, HDL, LDLCALC, TRIG, CHOLHDL, LDLDIRECT in the last 72 hours. Thyroid Function Tests: No results for input(s): TSH, T4TOTAL, FREET4, T3FREE, THYROIDAB in the last 72 hours. Anemia Panel: No results for input(s): VITAMINB12, FOLATE, FERRITIN, TIBC, IRON, RETICCTPCT in the last 72 hours. Sepsis Labs: No results for input(s): PROCALCITON, LATICACIDVEN in the last 168 hours.  No results found for this or any  previous visit (from the past 240 hour(s)).       Radiology Studies: No results found.      Scheduled Meds: . aspirin EC  81 mg Oral Daily  . atorvastatin  80 mg Oral q1800  . calcitRIOL      . calcitRIOL  0.5 mcg Oral Q M,W,F-HD  . clopidogrel  75 mg Oral Daily  . heparin injection (subcutaneous)  5,000 Units Subcutaneous Q8H  . insulin aspart  0-9 Units Subcutaneous TID WC  . latanoprost  1 drop Both Eyes Daily  . metoprolol tartrate  12.5 mg Oral BID  . pantoprazole  40 mg Oral Q0600  . sertraline  50 mg Oral QHS  . sevelamer carbonate  800 mg Oral TID WC   Continuous Infusions:   LOS: 8 days    Alwyn Ren, MD [facility] If 7PM-7AM, please contact night-coverage [email or web address] Password [email or web address] 07/15/2017, 9:02 AM

## 2017-07-15 NOTE — Progress Notes (Signed)
OT Cancellation Note  Patient Details Name: Richard Barnett MRN: [id number] DOB: 13-Mar-1955   Cancelled Treatment:    Reason Eval/Treat Not Completed: Patient at procedure or test/ unavailable. Pt still at HD since this morning, have checked on twice today. OT will re attempt next available time  Galen Manila 07/15/2017, 12:37 PM

## 2017-07-15 NOTE — Plan of Care (Signed)
  Problem: Clinical Measurements: Goal: Will remain free from infection Outcome: Progressing Note:  No s/s of infection noted. Goal: Respiratory complications will improve Outcome: Progressing Note:  No s/s of respiratory complications. Goal: Cardiovascular complication will be avoided Outcome: Progressing Note:  No cardiovascular complications noted.

## 2017-07-15 NOTE — Progress Notes (Addendum)
4:01 pm CSW continues to await call back from [facility] regarding patient's transfer to their center in [place].   1:48 pm CSW has faxed clinical records to [facility] to request transfer to [facility] dialysis center in [place]. Patient currently [facility] HD at [facility]), but there is no [facility] HD center in [place], where patient will go to SNF. CSW has spoke to [facility] representative via phone and provided patient demographics and additional information for transfer. [facility] to review clinicals and follow up for admission to their HD facility. CSW to follow.  Abigail Butts, LCSWA [phone]

## 2017-07-15 NOTE — Procedures (Signed)
I was present at this dialysis session. I have reviewed the session itself and made appropriate changes.   Filed Weights   07/14/17 0407 07/15/17 0630 07/15/17 0720  Weight: 92.2 kg (203 lb 4.8 oz) 92.8 kg (204 lb 8 oz) 93 kg (205 lb 0.4 oz)    Recent Labs  Lab 07/13/17 0720  NA 134*  K 4.1  CL 93*  CO2 26  GLUCOSE 105*  BUN 41*  CREATININE 11.39*  CALCIUM 9.1  PHOS 5.1*    Recent Labs  Lab 07/11/17 0650 07/13/17 0720 07/15/17 0621  WBC 9.8 9.1 6.0  HGB 9.3* 9.4* 10.2*  HCT 30.0* 30.3* 31.9*  MCV 94.6 94.7 93.8  PLT 464* 446* 327    Scheduled Meds: . aspirin EC  81 mg Oral Daily  . atorvastatin  80 mg Oral q1800  . calcitRIOL      . calcitRIOL  0.5 mcg Oral Q M,W,F-HD  . clopidogrel  75 mg Oral Daily  . heparin injection (subcutaneous)  5,000 Units Subcutaneous Q8H  . insulin aspart  0-9 Units Subcutaneous TID WC  . latanoprost  1 drop Both Eyes Daily  . metoprolol tartrate  12.5 mg Oral BID  . pantoprazole  40 mg Oral Q0600  . sertraline  50 mg Oral QHS  . sevelamer carbonate  800 mg Oral TID WC   Continuous Infusions: PRN Meds:.acetaminophen, bisacodyl, gi cocktail, HYDROcodone-acetaminophen, morphine injection, ondansetron **OR** ondansetron (ZOFRAN) IV, senna-docusate    Dialysis Orders: [facility] 4 hoursEDW(was 98kg but has been 93kg or below in the hospital)MWF  HD Bath2K/ 2 Ca, DialyzerF180 BFR 400 mL/ min, Heparin5000 u bolus. AccessLUE AVG. -Mircera200 q 2 wks (last 5/1)  -Venofer50 q wk  -Calcitriol 0.5TIW  Assessment/ Plan:   1. Atypical chest pain- at site of AICD in left mid-axillary region.Device/implant wound check per EP. No issues. Troponin 0.03 flat trend. 2. S/p cardiac arrest in the field- s/p AICD placement 3. AMS post cardiac arrest- impaired ambulation and cognition.  Lives alone and unable to care for himself at this time. 4. [id number] LUA AVG.Tolerating it well.Usual heparin dose. 5. Anemia:Hgb^ 9.3.Last ESA  07/06/17. Monitor. 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 Nutrition:renal diet 8. Hypertension: Drastically below OP EDW.BP stable. Will need standing post weight  9. DM per primary svc 10. Depression 11. Disposition- lives alone andSW consulted for placement. Will need to get Nephrology to accept patient at [facility] as he was accepted at [facility].  Transfer process has been initiated.    Irena Cords,  MD 07/15/2017, 8:53 AM

## 2017-07-16 LAB — GLUCOSE, CAPILLARY
Glucose-Capillary: 112 mg/dL — ABNORMAL HIGH (ref 65–99)
Glucose-Capillary: 112 mg/dL — ABNORMAL HIGH (ref 65–99)
Glucose-Capillary: 138 mg/dL — ABNORMAL HIGH (ref 65–99)
Glucose-Capillary: 122 mg/dL — ABNORMAL HIGH (ref 65–99)

## 2017-07-16 NOTE — Progress Notes (Signed)
PROGRESS NOTE    Richard Barnett  ZJQ:[id number] DOB: 08-28-1955 DOA: 07/07/2017 PCP: Care, [facility] Kussma[facility]  Brief Narrative: ::62 y.o.malewithwith a history of CAD status post stenting in November 2018, and a recent admission for cardiac arrest, with admission from 4/19/2019through 07/04/2017, status post cardiac catheterization on 06/28/2017, showing diffuse disease in the LAD with 40 to 50% narrowing, subsequently placing an ICD on 06/29/2017, and discharged in stable condition. At the time, skilled nursing facility was recommended, but patient chose to be discharged to home. Since his discharge, the patient has been on and off experiencing substernal chest pain. However, today, he reports sudden onset of severe chest pain, with associated diaphoresis. This began this morning. He denies any syncope or presyncope. He denies any palpitations. He denies any associated shortness of breath, or cough. He denies any pleuritic chest pain. However, the pain is characterized as dull, worse with movements. In fact, the patient reports not wanting to move, due to the pain which appears to be reproducible. He was concerned at the time, that his pacemaker was malfunctioning, but this was clear after interrogation. The patient denies any nausea or vomiting, denies any abdominal pain. The patient still makes some urine, denies any dysuria or gross hematuria. He denies any lower extremity swelling or calf pain. The patient did not take anymedications to treat this pain. Time of evaluation, he describes it as 10 out of 10.Compliant with his medications, denies tobacco, alcohol or recreational drug use.    Assessment & Plan:   Principal Problem:   Chest pain Active Problems:   CAD (coronary artery disease)   Hypertension   Hyperlipidemia   Prediabetes   Anemia of chronic renal failure   Secondary hyperparathyroidism of renal origin (HCC)   Arthritis   Depression   Deep vein  thrombosis (HCC)   Diabetes mellitus (HCC)   NSTEMI (non-ST elevated myocardial infarction) (HCC)   GERD (gastroesophageal reflux disease)   ESRD on dialysis (HCC)   Major depressive disorder, recurrent episode, severe (HCC)   Adjustment disorder with mixed disturbance of emotions and conduct   Cardiac arrest (HCC)   Chest pain syndrome  1]atypical chest pain patient has pain at the site of incisions. Also has a incision on the left mid chest which is covered with dressings. That is where he is complaining of pain. VQ scan negative.  2]end-stage renal disease on dialysis 3 times a week.  3]hypertension stable continue current medications.  4]type 2 diabeteshe is not on any medicines for diabetes. Blood sugar seems to be stable.  5]status post cardiac arrest 419 and AICD 06/29/2017       DVT prophylaxis heparin Code Status: Full code  Consultants:  Nephrology  Procedures: None Antimicrobials: None  Subjective: No complaints wants to be discharged   Objective: Vitals:   07/15/17 1222 07/15/17 2112 07/15/17 2134 07/16/17 0630  BP: (!) 139/97  123/78 119/82  Pulse: 95 92 86 86  Resp:   18   Temp: 97.9 F (36.6 C)   98.5 F (36.9 C)  TempSrc: Oral   Oral  SpO2: 100%  100% 100%  Weight:    91 kg (200 lb 9.9 oz)  Height:        Intake/Output Summary (Last 24 hours) at 07/16/2017 0947 Last data filed at 07/15/2017 2100 Gross per 24 hour  Intake 780 ml  Output 2050 ml  Net -1270 ml   Filed Weights   07/15/17 0720 07/15/17 1139 07/16/17 0630  Weight: 93  kg (205 lb 0.4 oz) 91 kg (200 lb 9.9 oz) 91 kg (200 lb 9.9 oz)    Examination:  General exam: Appears calm and comfortable  Respiratory system: Clear to auscultation. Respiratory effort normal. Cardiovascular system: S1 & S2 heard, RRR. No JVD, murmurs, rubs, gallops or clicks. No pedal edema. Gastrointestinal system: Abdomen is nondistended, soft and nontender. No organomegaly or masses felt.  Normal bowel sounds heard. Central nervous system: Alert and oriented. No focal neurological deficits. Extremities: Symmetric 5 x 5 power. Skin: No rashes, lesions or ulcers Psychiatry: Judgement and insight appear normal. Mood & affect appropriate.     Data Reviewed: I have personally reviewed following labs and imaging studies  CBC: Recent Labs  Lab 07/11/17 0650 07/13/17 0720 07/15/17 0621  WBC 9.8 9.1 6.0  HGB 9.3* 9.4* 10.2*  HCT 30.0* 30.3* 31.9*  MCV 94.6 94.7 93.8  PLT 464* 446* 327   Basic Metabolic Panel: Recent Labs  Lab 07/11/17 0650 07/13/17 0720 07/15/17 0621  NA 134* 134* 136  K 4.1 4.1 4.0  CL 94* 93* 94*  CO2 28 26 26   GLUCOSE 102* 105* 94  BUN 30* 41* 43*  CREATININE 10.50* 11.39* 11.60*  CALCIUM 9.2 9.1 9.1  PHOS 4.8* 5.1* 6.0*   GFR: Estimated Creatinine Clearance: 7.1 mL/min (A) (by C-G formula based on SCr of 11.6 mg/dL (H)). Liver Function Tests: Recent Labs  Lab 07/11/17 0650 07/13/17 0720 07/15/17 0621  ALBUMIN 3.7 3.7 3.9   No results for input(s): LIPASE, AMYLASE in the last 168 hours. No results for input(s): AMMONIA in the last 168 hours. Coagulation Profile: No results for input(s): INR, PROTIME in the last 168 hours. Cardiac Enzymes: No results for input(s): CKTOTAL, CKMB, CKMBINDEX, TROPONINI in the last 168 hours. BNP (last 3 results) No results for input(s): PROBNP in the last 8760 hours. HbA1C: No results for input(s): HGBA1C in the last 72 hours. CBG: Recent Labs  Lab 07/14/17 2102 07/15/17 1219 07/15/17 1640 07/15/17 2136 07/16/17 0746  GLUCAP 140* 102* 164* 119* 112*   Lipid Profile: No results for input(s): CHOL, HDL, LDLCALC, TRIG, CHOLHDL, LDLDIRECT in the last 72 hours. Thyroid Function Tests: No results for input(s): TSH, T4TOTAL, FREET4, T3FREE, THYROIDAB in the last 72 hours. Anemia Panel: No results for input(s): VITAMINB12, FOLATE, FERRITIN, TIBC, IRON, RETICCTPCT in the last 72 hours. Sepsis  Labs: No results for input(s): PROCALCITON, LATICACIDVEN in the last 168 hours.  No results found for this or any previous visit (from the past 240 hour(s)).       Radiology Studies: No results found.      Scheduled Meds: . aspirin EC  81 mg Oral Daily  . atorvastatin  80 mg Oral q1800  . calcitRIOL  0.5 mcg Oral Q M,W,F-HD  . clopidogrel  75 mg Oral Daily  . heparin injection (subcutaneous)  5,000 Units Subcutaneous Q8H  . insulin aspart  0-9 Units Subcutaneous TID WC  . latanoprost  1 drop Both Eyes Daily  . metoprolol tartrate  12.5 mg Oral BID  . pantoprazole  40 mg Oral Q0600  . sertraline  50 mg Oral QHS  . sevelamer carbonate  800 mg Oral TID WC   Continuous Infusions:   LOS: 9 days     Alwyn Ren, MD [facility]  If 7PM-7AM, please contact night-coverage [email or web address] Password [facility] 07/16/2017, 9:47 AM

## 2017-07-16 NOTE — Progress Notes (Addendum)
Subjective:  No co, tolerated hd yesterday/ noted for dc today    Objective Vital signs in last 24 hours: Vitals:   07/15/17 1222 07/15/17 2112 07/15/17 2134 07/16/17 0630  BP: (!) 139/97  123/78 119/82  Pulse: 95 92 86 86  Resp:   18   Temp: 97.9 F (36.6 C)   98.5 F (36.9 C)  TempSrc: Oral   Oral  SpO2: 100%  100% 100%  Weight:    91 kg (200 lb 9.9 oz)  Height:       Weight change: 0.239 kg (8.4 oz)  Physical Exam: General: alert NAD Heart: RRR No mrg Lungs: CTA Abdomen: BS + ,soft , ND, NT Extremities: no pedal edema  Dialysis Access: pos bruit  LUA AVG  Dialysis Orders: [facility] 4 hoursEDW(was 98kg ) now 91 kg in the hospital)MWF  HD Bath2K/ 2 Ca, DialyzerF180 BFR 400 mL/ min, Heparin5000 u bolus. AccessLUE AVG. -Mircera200 q 2 wks (last 5/1)  -Venofer50 q wk  -Calcitriol 0.5TIW  Assessment/ Plan:  1. Atypical chest pain- at site of AICD in left mid-axillary region.no cp this am reported to me /VQ scan neg. 2. S/p cardiac arrest in the field- s/p AICD placement 3. AMS post cardiac arrest- impaired ambulation and cognition.  Lives alone and unable to care for himself at this time. Noted SW  Placing in [place] area =[facility] 4. ESRD: HD MWFTolerating it well.Usual heparin dose. 5. Anemia:Hgb^ 9.3>10.2.Last ESA 07/06/17. Monitor. 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 Nutrition:renal diet 8. Hypertension: Drastically below OP EDW.BP stable. lower edw ~ 91kg   9. DM per primary svc 10. Depression per admitting  11. Disposition- lives alone andSW consulted for placement. noted Marcene Corning in [place] reviewing his records as noted Yesterday per SW.Will need to get Nephrology to accept patient at [facility] as he was accepted at [facility].  Transfer process  initiated.   Lenny Pastel, PA-C [facility] Beeper [phone] 07/16/2017,11:41 AM  LOS: 9 days   Labs: Basic Metabolic Panel: Recent  Labs  Lab 07/11/17 0650 07/13/17 0720 07/15/17 0621  NA 134* 134* 136  K 4.1 4.1 4.0  CL 94* 93* 94*  CO2 28 26 26   GLUCOSE 102* 105* 94  BUN 30* 41* 43*  CREATININE 10.50* 11.39* 11.60*  CALCIUM 9.2 9.1 9.1  PHOS 4.8* 5.1* 6.0*   Liver Function Tests: Recent Labs  Lab 07/11/17 0650 07/13/17 0720 07/15/17 0621  ALBUMIN 3.7 3.7 3.9   No results for input(s): LIPASE, AMYLASE in the last 168 hours. No results for input(s): AMMONIA in the last 168 hours. CBC: Recent Labs  Lab 07/11/17 0650 07/13/17 0720 07/15/17 0621  WBC 9.8 9.1 6.0  HGB 9.3* 9.4* 10.2*  HCT 30.0* 30.3* 31.9*  MCV 94.6 94.7 93.8  PLT 464* 446* 327   Cardiac Enzymes: No results for input(s): CKTOTAL, CKMB, CKMBINDEX, TROPONINI in the last 168 hours. CBG: Recent Labs  Lab 07/15/17 1219 07/15/17 1640 07/15/17 2136 07/16/17 0746 07/16/17 1124  GLUCAP 102* 164* 119* 112* 112*    Studies/Results: No results found. Medications:  . aspirin EC  81 mg Oral Daily  . atorvastatin  80 mg Oral q1800  . calcitRIOL  0.5 mcg Oral Q M,W,F-HD  . clopidogrel  75 mg Oral Daily  . heparin injection (subcutaneous)  5,000 Units Subcutaneous Q8H  . insulin aspart  0-9 Units Subcutaneous TID WC  . latanoprost  1 drop Both Eyes Daily  . metoprolol tartrate  12.5 mg Oral BID  . pantoprazole  40 mg Oral Q0600  . sertraline  50 mg Oral QHS  . sevelamer carbonate  800 mg Oral TID WC   I have seen and examined this patient and agree with plan and assessment in the above note with renal recommendations/intervention highlighted.   Awaiting acceptance at [facility].   Jomarie Longs A Lakeesha Fontanilla,MD 07/16/2017 2:08 PM

## 2017-07-16 NOTE — Plan of Care (Signed)
  Problem: Activity: Goal: Risk for activity intolerance will decrease Outcome: Progressing Note:  Ambulates in room without difficulty.   Problem: Clinical Measurements: Goal: Will remain free from infection Outcome: Completed/Met Note:  No s/s of infection noted. Goal: Respiratory complications will improve Outcome: Completed/Met Note:  No s/s of respiratory complications noted.  O2 sat WNL on room air. Goal: Cardiovascular complication will be avoided Outcome: Completed/Met Note:  No s/s of cardiovascular complication.  NSR on tele.  VSS.

## 2017-07-17 LAB — GLUCOSE, CAPILLARY
GLUCOSE-CAPILLARY: 103 mg/dL — AB (ref 65–99)
Glucose-Capillary: 130 mg/dL — ABNORMAL HIGH (ref 65–99)
Glucose-Capillary: 97 mg/dL (ref 65–99)
Glucose-Capillary: 132 mg/dL — ABNORMAL HIGH (ref 65–99)

## 2017-07-17 MED ORDER — SEVELAMER CARBONATE 800 MG PO TABS
1600.0000 mg | ORAL_TABLET | Freq: Three times a day (TID) | ORAL | Status: DC
  Administered 2017-07-17 – 2017-07-19 (×6): 1600 mg via ORAL
  Filled 2017-07-17 (×6): qty 2

## 2017-07-17 MED ORDER — SEVELAMER CARBONATE 800 MG PO TABS: 1600.0000 mg | ORAL_TABLET | Freq: Three times a day (TID) | ORAL | 0 refills | Status: AC

## 2017-07-17 NOTE — Progress Notes (Signed)
PROGRESS NOTE    Richard Barnett  ZOX:[id number] DOB: 09-18-1955 DOA: 07/07/2017 PCP: [facility], [facility]   Brief Narrative:61 y.o.malewithwith a history of CAD status post stenting in November 2018, and a recent admission for cardiac arrest, with admission from 4/19/2019through 07/04/2017, status post cardiac catheterization on 06/28/2017, showing diffuse disease in the LAD with 40 to 50% narrowing, subsequently placing an ICD on 06/29/2017, and discharged in stable condition. At the time, skilled nursing facility was recommended, but patient chose to be discharged to home. Since his discharge, the patient has been on and off experiencing substernal chest pain. However, today, he reports sudden onset of severe chest pain, with associated diaphoresis. This began this morning. He denies any syncope or presyncope. He denies any palpitations. He denies any associated shortness of breath, or cough. He denies any pleuritic chest pain. However, the pain is characterized as dull, worse with movements. In fact, the patient reports not wanting to move, due to the pain which appears to be reproducible. He was concerned at the time, that his pacemaker was malfunctioning, but this was clear after interrogation. The patient denies any nausea or vomiting, denies any abdominal pain. The patient still makes some urine, denies any dysuria or gross hematuria. He denies any lower extremity swelling or calf pain. The patient did not take anymedications to treat this pain. Time of evaluation, he describes it as 10 out of 10.Compliant with his medications, denies tobacco, alcohol or recreational drug use.    Assessment & Plan:   Principal Problem:   Chest pain Active Problems:   CAD (coronary artery disease)   Hypertension   Hyperlipidemia   Prediabetes   Anemia of chronic renal failure   Secondary hyperparathyroidism of renal origin (HCC)   Arthritis   Depression   Deep vein  thrombosis (HCC)   Diabetes mellitus (HCC)   NSTEMI (non-ST elevated myocardial infarction) (HCC)   GERD (gastroesophageal reflux disease)   ESRD on dialysis (HCC)   Major depressive disorder, recurrent episode, severe (HCC)   Adjustment disorder with mixed disturbance of emotions and conduct   Cardiac arrest (HCC)   Chest pain syndrome 1]atypical chest pain patient has pain at the site of incisions. Also has a incision on the left mid chest which is covered with dressings. That is where he is complaining of pain. VQ scan negative.  2]end-stage renal disease on dialysis 3 times a week.  3]hypertension stable continue current medications.  4]type 2 diabeteshe is not on any medicines for diabetes. Blood sugar seems to be stable.  5]status post cardiac arrest 419 and AICD 06/29/2017      DVT prophylaxis: Heparin Code Status: Full code  Family Communication: No family available  Disposition Plan: Awaiting SNF placement  Consultants  Nephrology  Procedures: None Antimicrobials: None  Subjective: No complaints  Objective: Vitals:   07/16/17 1522 07/16/17 2206 07/17/17 0516 07/17/17 0518  BP: 133/81 123/82  118/75  Pulse: 95 86  81  Resp:    18  Temp: 98.2 F (36.8 C) 98.7 F (37.1 C)    TempSrc: Oral Oral    SpO2: 98% 98%  100%  Weight:   92.4 kg (203 lb 9.6 oz)   Height:        Intake/Output Summary (Last 24 hours) at 07/17/2017 1342 Last data filed at 07/16/2017 1739 Gross per 24 hour  Intake 238 ml  Output -  Net 238 ml   Filed Weights   07/15/17 1139 07/16/17 0630 07/17/17 0516  Weight: 91 kg (200 lb 9.9 oz) 91 kg (200 lb 9.9 oz) 92.4 kg (203 lb 9.6 oz)    Examination:  General exam: Appears calm and comfortable  Respiratory system: Clear to auscultation. Respiratory effort normal. Cardiovascular system: S1 & S2 heard, RRR. No JVD, murmurs, rubs, gallops or clicks. No pedal edema. Gastrointestinal system: Abdomen is nondistended, soft  and nontender. No organomegaly or masses felt. Normal bowel sounds heard. Central nervous system: Alert and oriented. No focal neurological deficits. Extremities: Symmetric 5 x 5 power. Skin: No rashes, lesions or ulcers Psychiatry: Judgement and insight appear normal. Mood & affect appropriate.     Data Reviewed: I have personally reviewed following labs and imaging studies  CBC: Recent Labs  Lab 07/11/17 0650 07/13/17 0720 07/15/17 0621  WBC 9.8 9.1 6.0  HGB 9.3* 9.4* 10.2*  HCT 30.0* 30.3* 31.9*  MCV 94.6 94.7 93.8  PLT 464* 446* 327   Basic Metabolic Panel: Recent Labs  Lab 07/11/17 0650 07/13/17 0720 07/15/17 0621  NA 134* 134* 136  K 4.1 4.1 4.0  CL 94* 93* 94*  CO2 28 26 26   GLUCOSE 102* 105* 94  BUN 30* 41* 43*  CREATININE 10.50* 11.39* 11.60*  CALCIUM 9.2 9.1 9.1  PHOS 4.8* 5.1* 6.0*   GFR: Estimated Creatinine Clearance: 7.2 mL/min (A) (by C-G formula based on SCr of 11.6 mg/dL (H)). Liver Function Tests: Recent Labs  Lab 07/11/17 0650 07/13/17 0720 07/15/17 0621  ALBUMIN 3.7 3.7 3.9   No results for input(s): LIPASE, AMYLASE in the last 168 hours. No results for input(s): AMMONIA in the last 168 hours. Coagulation Profile: No results for input(s): INR, PROTIME in the last 168 hours. Cardiac Enzymes: No results for input(s): CKTOTAL, CKMB, CKMBINDEX, TROPONINI in the last 168 hours. BNP (last 3 results) No results for input(s): PROBNP in the last 8760 hours. HbA1C: No results for input(s): HGBA1C in the last 72 hours. CBG: Recent Labs  Lab 07/16/17 1124 07/16/17 1524 07/16/17 2209 07/17/17 0741 07/17/17 1119  GLUCAP 112* 138* 122* 103* 130*   Lipid Profile: No results for input(s): CHOL, HDL, LDLCALC, TRIG, CHOLHDL, LDLDIRECT in the last 72 hours. Thyroid Function Tests: No results for input(s): TSH, T4TOTAL, FREET4, T3FREE, THYROIDAB in the last 72 hours. Anemia Panel: No results for input(s): VITAMINB12, FOLATE, FERRITIN, TIBC,  IRON, RETICCTPCT in the last 72 hours. Sepsis Labs: No results for input(s): PROCALCITON, LATICACIDVEN in the last 168 hours.  No results found for this or any previous visit (from the past 240 hour(s)).       Radiology Studies: No results found.      Scheduled Meds: . aspirin EC  81 mg Oral Daily  . atorvastatin  80 mg Oral q1800  . calcitRIOL  0.5 mcg Oral Q M,W,F-HD  . clopidogrel  75 mg Oral Daily  . heparin injection (subcutaneous)  5,000 Units Subcutaneous Q8H  . insulin aspart  0-9 Units Subcutaneous TID WC  . latanoprost  1 drop Both Eyes Daily  . metoprolol tartrate  12.5 mg Oral BID  . pantoprazole  40 mg Oral Q0600  . sertraline  50 mg Oral QHS  . sevelamer carbonate  1,600 mg Oral TID WC   Continuous Infusions:   LOS: 10 days      Alwyn Ren, MD [facility] If 7PM-7AM, please contact night-coverage [email or web address] Password [email or web address] 07/17/2017, 1:42 PM

## 2017-07-17 NOTE — Progress Notes (Signed)
PROGRESS NOTE    Richard Barnett  FTD:[id number] DOB: October 20, 1955 DOA: 07/07/2017 PCP: [facility]  Brief Narrative: Assessment & Plan:   Principal Problem:   Chest pain Active Problems:   CAD (coronary artery disease)   Hypertension   Hyperlipidemia   Prediabetes   Anemia of chronic renal failure   Secondary hyperparathyroidism of renal origin (HCC)   Arthritis   Depression   Deep vein thrombosis (HCC)   Diabetes mellitus (HCC)   NSTEMI (non-ST elevated myocardial infarction) (HCC)   GERD (gastroesophageal reflux disease)   ESRD on dialysis (HCC)   Major depressive disorder, recurrent episode, severe (HCC)   Adjustment disorder with mixed disturbance of emotions and conduct   Cardiac arrest (HCC)   Chest pain syndrome   1]atypical chest pain patient has pain at the site of incisions. Also has a incision on the left mid chest which is covered with dressings. That is where he is complaining of pain. VQ scan negative.  2]end-stage renal disease on dialysis 3 times a week.  3]hypertension stable continue current medications.  4]type 2 diabeteshe is not on any medicines for diabetes. Blood sugar seems to be stable.  5]status post cardiac arrest 16 and AICD 06/29/2017      DVT prophylaxis: Heparin Code Status: Full code Family Communication: No family available Disposition Plan: Outpatient dialysis being arranged at [facility] in [place] then patient will be discharged to a skilled nursing facility near the dialysis center. Consultants: Renal  procedures: None Antimicrobials: None  Subjective:   Objective: Vitals:   07/16/17 1522 07/16/17 2206 07/17/17 0516 07/17/17 0518  BP: 133/81 123/82  118/75  Pulse: 95 86  81  Resp:    18  Temp: 98.2 F (36.8 C) 98.7 F (37.1 C)    TempSrc: Oral Oral    SpO2: 98% 98%  100%  Weight:   92.4 kg (203 lb 9.6 oz)   Height:        Intake/Output Summary (Last 24 hours) at 07/17/2017 1350 Last  data filed at 07/16/2017 1739 Gross per 24 hour  Intake 238 ml  Output -  Net 238 ml   Filed Weights   07/15/17 1139 07/16/17 0630 07/17/17 0516  Weight: 91 kg (200 lb 9.9 oz) 91 kg (200 lb 9.9 oz) 92.4 kg (203 lb 9.6 oz)    Examination:  General exam: Appears calm and comfortable  Respiratory system: Clear to auscultation. Respiratory effort normal. Cardiovascular system: S1 & S2 heard, RRR. No JVD, murmurs, rubs, gallops or clicks. No pedal edema. Gastrointestinal system: Abdomen is nondistended, soft and nontender. No organomegaly or masses felt. Normal bowel sounds heard. Central nervous system: Alert and oriented. No focal neurological deficits. Extremities: Symmetric 5 x 5 power. Skin: No rashes, lesions or ulcers Psychiatry: Judgement and insight appear normal. Mood & affect appropriate.     Data Reviewed: I have personally reviewed following labs and imaging studies  CBC: Recent Labs  Lab 07/11/17 0650 07/13/17 0720 07/15/17 0621  WBC 9.8 9.1 6.0  HGB 9.3* 9.4* 10.2*  HCT 30.0* 30.3* 31.9*  MCV 94.6 94.7 93.8  PLT 464* 446* 327   Basic Metabolic Panel: Recent Labs  Lab 07/11/17 0650 07/13/17 0720 07/15/17 0621  NA 134* 134* 136  K 4.1 4.1 4.0  CL 94* 93* 94*  CO2 28 26 26   GLUCOSE 102* 105* 94  BUN 30* 41* 43*  CREATININE 10.50* 11.39* 11.60*  CALCIUM 9.2 9.1 9.1  PHOS 4.8* 5.1* 6.0*  GFR: Estimated Creatinine Clearance: 7.2 mL/min (A) (by C-G formula based on SCr of 11.6 mg/dL (H)). Liver Function Tests: Recent Labs  Lab 07/11/17 0650 07/13/17 0720 07/15/17 0621  ALBUMIN 3.7 3.7 3.9   No results for input(s): LIPASE, AMYLASE in the last 168 hours. No results for input(s): AMMONIA in the last 168 hours. Coagulation Profile: No results for input(s): INR, PROTIME in the last 168 hours. Cardiac Enzymes: No results for input(s): CKTOTAL, CKMB, CKMBINDEX, TROPONINI in the last 168 hours. BNP (last 3 results) No results for input(s): PROBNP in  the last 8760 hours. HbA1C: No results for input(s): HGBA1C in the last 72 hours. CBG: Recent Labs  Lab 07/16/17 1124 07/16/17 1524 07/16/17 2209 07/17/17 0741 07/17/17 1119  GLUCAP 112* 138* 122* 103* 130*   Lipid Profile: No results for input(s): CHOL, HDL, LDLCALC, TRIG, CHOLHDL, LDLDIRECT in the last 72 hours. Thyroid Function Tests: No results for input(s): TSH, T4TOTAL, FREET4, T3FREE, THYROIDAB in the last 72 hours. Anemia Panel: No results for input(s): VITAMINB12, FOLATE, FERRITIN, TIBC, IRON, RETICCTPCT in the last 72 hours. Sepsis Labs: No results for input(s): PROCALCITON, LATICACIDVEN in the last 168 hours.  No results found for this or any previous visit (from the past 240 hour(s)).       Radiology Studies: No results found.      Scheduled Meds: . aspirin EC  81 mg Oral Daily  . atorvastatin  80 mg Oral q1800  . calcitRIOL  0.5 mcg Oral Q M,W,F-HD  . clopidogrel  75 mg Oral Daily  . heparin injection (subcutaneous)  5,000 Units Subcutaneous Q8H  . insulin aspart  0-9 Units Subcutaneous TID WC  . latanoprost  1 drop Both Eyes Daily  . metoprolol tartrate  12.5 mg Oral BID  . pantoprazole  40 mg Oral Q0600  . sertraline  50 mg Oral QHS  . sevelamer carbonate  1,600 mg Oral TID WC   Continuous Infusions:   LOS: 10 days     Alwyn Ren, MD [facility]  If 7PM-7AM, please contact night-coverage [email or web address] Password [email or web address] 07/17/2017, 1:50 PM

## 2017-07-17 NOTE — Progress Notes (Signed)
Pt's dialysis is in the progress of being set up in [place]. Cliniciall records were faxed to [facility] in [place] on 5/10. CSW will follow up with [facility].  Budd Palmer LCSWA [phone]

## 2017-07-17 NOTE — Progress Notes (Addendum)
Subjective:  No cos / noted for NHP in [place] area  Objective Vital signs in last 24 hours: Vitals:   07/16/17 1522 07/16/17 2206 07/17/17 0516 07/17/17 0518  BP: 133/81 123/82  118/75  Pulse: 95 86  81  Resp:    18  Temp: 98.2 F (36.8 C) 98.7 F (37.1 C)    TempSrc: Oral Oral    SpO2: 98% 98%  100%  Weight:   92.4 kg (203 lb 9.6 oz)   Height:       Weight change: -0.648 kg (-1 lb 6.8 oz)   Physical Exam: General: alert NAD Heart: RRR No mrg Lungs: CTA Abdomen: BS + ,soft , ND, NT Extremities: no pedal edema  Dialysis Access: pos bruit  LUA AVG  Dialysis Orders: [facility] 4 hoursEDW(was 98kg ) now 91 kg in the hospital)MWF  HD Bath2K/ 2 Ca, DialyzerF180 BFR 400 mL/ min, Heparin5000 u bolus. AccessLUE AVG. -Mircera200 q 2 wks (last 5/1)  -Venofer50 q wk  -Calcitriol 0.5TIW   Problem/Plan 1. Atypical chest pain- No resolved,at site of AICD in left mid-axillary region.no cp this am  /VQ scan neg. 2. S/p cardiac arrest in the field- s/p AICD placement 3. AMS post cardiac arrest- impaired ambulation and cognition. Lives alone and unable to care for himself at this time. Noted SW  Placing in [place] area =[facility] 4. ESRD: HD MWFTolerating it well.Usual heparin dose. 5. Anemia:Hgb^ 9.3>10.2.Last ESA 07/06/17. Monitor. 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 Nutrition:renal diet 8. Hypertension: Drastically below OP EDW.BP stable. lower edw ~ 91kg   9. DM per primary svc 10. Depression per admitting  11. Disposition- lives alone andSW consulted for placement. noted [facility] [place] reviewing his records as noted  per SW.Will need to get Nephrology to accept patient at [facility] as he was accepted [facility]. Transfer process  initiated.  Lenny Pastel, PA-C [facility] [phone] 07/17/2017,9:50 AM  LOS: 10 days   Labs: Basic Metabolic Panel: Recent Labs  Lab  07/11/17 0650 07/13/17 0720 07/15/17 0621  NA 134* 134* 136  K 4.1 4.1 4.0  CL 94* 93* 94*  CO2 28 26 26   GLUCOSE 102* 105* 94  BUN 30* 41* 43*  CREATININE 10.50* 11.39* 11.60*  CALCIUM 9.2 9.1 9.1  PHOS 4.8* 5.1* 6.0*   Liver Function Tests: Recent Labs  Lab 07/11/17 0650 07/13/17 0720 07/15/17 0621  ALBUMIN 3.7 3.7 3.9   No results for input(s): LIPASE, AMYLASE in the last 168 hours. No results for input(s): AMMONIA in the last 168 hours. CBC: Recent Labs  Lab 07/11/17 0650 07/13/17 0720 07/15/17 0621  WBC 9.8 9.1 6.0  HGB 9.3* 9.4* 10.2*  HCT 30.0* 30.3* 31.9*  MCV 94.6 94.7 93.8  PLT 464* 446* 327   Cardiac Enzymes: No results for input(s): CKTOTAL, CKMB, CKMBINDEX, TROPONINI in the last 168 hours. CBG: Recent Labs  Lab 07/16/17 0746 07/16/17 1124 07/16/17 1524 07/16/17 2209 07/17/17 0741  GLUCAP 112* 112* 138* 122* 103*    Studies/Results: No results found. Medications:  . aspirin EC  81 mg Oral Daily  . atorvastatin  80 mg Oral q1800  . calcitRIOL  0.5 mcg Oral Q M,W,F-HD  . clopidogrel  75 mg Oral Daily  . heparin injection (subcutaneous)  5,000 Units Subcutaneous Q8H  . insulin aspart  0-9 Units Subcutaneous TID WC  . latanoprost  1 drop Both Eyes Daily  . metoprolol tartrate  12.5  mg Oral BID  . pantoprazole  40 mg Oral Q0600  . sertraline  50 mg Oral QHS  . sevelamer carbonate  800 mg Oral TID WC    I have seen and examined this patient and agree with plan and assessment in the above note with renal recommendations/intervention highlighted.  Plan for HD tomorrow with new edw. Awaiting acceptance from [facility]. Jomarie Longs A Eliga Arvie,MD 07/17/2017 12:04 PM

## 2017-07-18 ENCOUNTER — Other Ambulatory Visit: Payer: Self-pay | Admitting: Internal Medicine

## 2017-07-18 LAB — RENAL FUNCTION PANEL
ALBUMIN: 3.8 g/dL (ref 3.5–5.0)
ANION GAP: 15 (ref 5–15)
BUN: 53 mg/dL — ABNORMAL HIGH (ref 6–20)
CO2: 24 mmol/L (ref 22–32)
Calcium: 8.9 mg/dL (ref 8.9–10.3)
Chloride: 97 mmol/L — ABNORMAL LOW (ref 101–111)
Creatinine, Ser: 13.13 mg/dL — ABNORMAL HIGH (ref 0.61–1.24)
GFR, EST AFRICAN AMERICAN: 4 mL/min — AB (ref >60)
GFR, EST NON AFRICAN AMERICAN: 3 mL/min — AB (ref >60)
Glucose, Bld: 129 mg/dL — ABNORMAL HIGH (ref 65–99)
PHOSPHORUS: 6.3 mg/dL — AB (ref 2.5–4.6)
POTASSIUM: 3.8 mmol/L (ref 3.5–5.1)
Sodium: 136 mmol/L (ref 135–145)

## 2017-07-18 LAB — CBC WITH DIFFERENTIAL/PLATELET
BASOS ABS: 0 10*3/uL (ref 0.0–0.1)
BASOS PCT: 1 %
Eosinophils Absolute: 0.3 10*3/uL (ref 0.0–0.7)
Eosinophils Relative: 5 %
HEMATOCRIT: 28.8 % — AB (ref 39.0–52.0)
HEMOGLOBIN: 9 g/dL — AB (ref 13.0–17.0)
Lymphocytes Relative: 39 %
Lymphs Abs: 2.4 10*3/uL (ref 0.7–4.0)
MCH: 29 pg (ref 26.0–34.0)
MCHC: 31.3 g/dL (ref 30.0–36.0)
MCV: 92.9 fL (ref 78.0–100.0)
MONOS PCT: 13 %
Monocytes Absolute: 0.8 10*3/uL (ref 0.1–1.0)
NEUTROS PCT: 42 %
Neutro Abs: 2.6 10*3/uL (ref 1.7–7.7)
Platelets: 299 10*3/uL (ref 150–400)
RBC: 3.1 MIL/uL — AB (ref 4.22–5.81)
RDW: 17.2 % — ABNORMAL HIGH (ref 11.5–15.5)
WBC: 6.2 10*3/uL (ref 4.0–10.5)

## 2017-07-18 LAB — GLUCOSE, CAPILLARY
GLUCOSE-CAPILLARY: 104 mg/dL — AB (ref 65–99)
GLUCOSE-CAPILLARY: 98 mg/dL (ref 65–99)
Glucose-Capillary: 120 mg/dL — ABNORMAL HIGH (ref 65–99)
Glucose-Capillary: 154 mg/dL — ABNORMAL HIGH (ref 65–99)

## 2017-07-18 MED ORDER — HEPARIN 1000 UNIT/ML FOR PERITONEAL DIALYSIS
4000.0000 [IU] | Freq: Once | INTRAMUSCULAR | Status: AC
  Administered 2017-07-18: 4000 [IU] via INTRAPERITONEAL
  Filled 2017-07-18: qty 4

## 2017-07-18 NOTE — Progress Notes (Signed)
[facility] Progress Note  Subjective: no new c.o  Vitals:   07/17/17 0518 07/17/17 1442 07/17/17 2135 07/18/17 0421  BP: 118/75 111/76 126/78 116/72  Pulse: 81 61 82 75  Resp: 18   13  Temp:  98.5 F (36.9 C) 98.9 F (37.2 C) 98.3 F (36.8 C)  TempSrc:  Oral Oral Oral  SpO2: 100% 98% 100% 100%  Weight:    93.2 kg (205 lb 8 oz)  Height:        Inpatient medications: . aspirin EC  81 mg Oral Daily  . atorvastatin  80 mg Oral q1800  . calcitRIOL  0.5 mcg Oral Q M,W,F-HD  . clopidogrel  75 mg Oral Daily  . heparin injection (subcutaneous)  5,000 Units Subcutaneous Q8H  . insulin aspart  0-9 Units Subcutaneous TID WC  . latanoprost  1 drop Both Eyes Daily  . metoprolol tartrate  12.5 mg Oral BID  . pantoprazole  40 mg Oral Q0600  . sertraline  50 mg Oral QHS  . sevelamer carbonate  1,600 mg Oral TID WC    acetaminophen, bisacodyl, gi cocktail, heparin, HYDROcodone-acetaminophen, morphine injection, ondansetron **OR** ondansetron (ZOFRAN) IV, senna-docusate  Exam: Alert no distress No jvd Chest cta bilat RRR no mrg Abd soft ntnd  Ext no edema LUA AVG+bruit  [facility]  4h  edw was 98kg, now down to 91kg here  Heparin 5000 L AVG  2/2 bath  mircera 200 ug last 5/1 due 5/15 venofer 50 per wk calcitriol 0.5 tiw       Impression: 1  Chest pain - atypical per cards, VQ neg 2  Sp cardiac arrest - sp ICD placement 3  Impaired cognition/ ambulation - felt to be ABI after cardiac arrest / resucitation 4  Esrd on hd mwf 5  Anemia ckd - stable Hb 10's 6  HTN / vol - 8 kg under prior dry wt 7  Depression 8  Dispo - possible dc soon to SNF in [facility] -HD today   Vinson Moselle MD [facility] pager [phone]   07/18/2017, 2:00 PM   Recent Labs  Lab 07/13/17 0720 07/15/17 0621  NA 134* 136  K 4.1 4.0  CL 93* 94*  CO2 26 26  GLUCOSE 105* 94  BUN 41* 43*  CREATININE 11.39* 11.60*  CALCIUM 9.1 9.1  PHOS 5.1* 6.0*    Recent Labs  Lab 07/13/17 0720 07/15/17 0621  ALBUMIN 3.7 3.9   Recent Labs  Lab 07/13/17 0720 07/15/17 0621  WBC 9.1 6.0  HGB 9.4* 10.2*  HCT 30.3* 31.9*  MCV 94.7 93.8  PLT 446* 327   Iron/TIBC/Ferritin/ %Sat No results found for: IRON, TIBC, FERRITIN, IRONPCTSAT

## 2017-07-18 NOTE — Progress Notes (Signed)
CSW coordinating transfer of patient's dialysis to [facility] in [place]. CSW spoke to [employer] at [place] [phone] ext [phone]) and Ilene requested additional clinicals (H&P, progress notes, and nephrology consult notes) from patient's admission. CSW faxed requested clinicals. CSW awaiting call back regarding patient's admission to [facility] for HD treatment.  CSW met with patient at bedside. Patient remains agreeable to go to [facility], as long as he can bring his television from home. CSW confirmed with SNF that he is allowed to do this. Patient very anxious about coordinating bringing his belongings from home, as he will be unable to bring television in transport [place]. CSW left message for patient's daughter Kendrick Fries, to request assistance in bringing patient's belongings to [place].  CSW to follow.  Estanislado Emms, [place]

## 2017-07-18 NOTE — Progress Notes (Signed)
Occupational Therapy Treatment Patient Details Name: Richard Barnett MRN: [id number] DOB: [birth year] Today's Date: 07/18/2017    History of present illness  Richard Barnett is a 62 y.o. [place] with with a history of CAD status post stenting in November 2018, and a recent admission for cardiac arrest, with admission from 06/24/2017 through 07/04/2017, status post cardiac catheterization on 06/28/2017, showing diffuse disease in the LAD with 40 to 50% narrowing, subsequently placing an ICD on 06/29/2017, and discharged in stable condition.  At the time, skilled nursing facility was recommended, but patient chose to be discharged to home.  Admitted with chest pain.   OT comments  Pt making progress with functional goals, however continued to fatigue easily with minimal exertion during ADLs and ADL mobility. OT will continue to follow acutely  Follow Up Recommendations  SNF;Supervision/Assistance - 24 hour    Equipment Recommendations  Other (comment)(TBD at next venue of care)    Recommendations for Other Services      Precautions / Restrictions Precautions Precautions: Fall;ICD/Pacemaker Restrictions Weight Bearing Restrictions: No LUE Weight Bearing: Non weight bearing Other Position/Activity Restrictions: No pushing, pulling, lifting       Mobility Bed Mobility Overal bed mobility: Needs Assistance Bed Mobility: Supine to Sit;Sit to Supine     Supine to sit: Supervision Sit to supine: Supervision      Transfers Overall transfer level: Needs assistance Equipment used: None Transfers: Sit to/from Stand Sit to Stand: Supervision         General transfer comment: supervision for safety    Balance Overall balance assessment: Needs assistance Sitting-balance support: Feet supported;No upper extremity supported Sitting balance-Leahy Scale: Good     Standing balance support: No upper extremity supported;During functional activity Standing balance-Leahy Scale: Fair                              ADL either performed or assessed with clinical judgement   ADL Overall ADL's : Needs assistance/impaired     Grooming: Wash/dry hands;Wash/dry face;Set up;Supervision/safety;Standing Grooming Details (indicate cue type and reason):  Pt unable to tolerate standing at sink for >3 minutes for grooming  at this time due to fatigue         Upper Body Dressing : Sitting;Minimal assistance   Lower Body Dressing: Sit to/from stand;Minimal assistance Lower Body Dressing Details (indicate cue type and reason): assist for tasks that require BUE assist (like donning underwear/pants) Toilet Transfer: Ambulation;Regular Toilet;Grab bars;Supervision/safety   Toileting- Clothing Manipulation and Hygiene: Sit to/from stand;Min guard         General ADL Comments: reviewed L UE precautions. Pt educated on energy conservation techniques and ADL A/E with handouts provided     Vision Patient Visual Report: No change from baseline     Perception     Praxis      Cognition Arousal/Alertness: Awake/alert Behavior During Therapy: WFL for tasks assessed/performed Overall Cognitive Status: No family/caregiver present to determine baseline cognitive functioning Area of Impairment: Memory;Problem solving                     Memory: Decreased short-term memory;Decreased recall of precautions Following Commands: Follows one step commands with increased time Safety/Judgement: Decreased awareness of safety;Decreased awareness of deficits   Problem Solving: Slow processing;Requires verbal cues          Exercises     Shoulder Instructions       General Comments  Pertinent Vitals/ Pain       Pain Assessment: 0-10 Pain Score: 3  Pain Location: chest Pain Descriptors / Indicators: Sore Pain Intervention(s): Monitored during session  Home Living                                          Prior Functioning/Environment               Frequency  Min 2X/week        Progress Toward Goals  OT Goals(current goals can now be found in the care plan section)  Progress towards OT goals: Progressing toward goals     Plan Discharge plan remains appropriate    Co-evaluation                 AM-PAC PT "6 Clicks" Daily Activity     Outcome Measure   Help from another person eating meals?: None Help from another person taking care of personal grooming?: A Little Help from another person toileting, which includes using toliet, bedpan, or urinal?: A Little Help from another person bathing (including washing, rinsing, drying)?: A Lot Help from another person to put on and taking off regular upper body clothing?: A Little Help from another person to put on and taking off regular lower body clothing?: A Lot 6 Click Score: 17    End of Session    OT Visit Diagnosis: Unsteadiness on feet (R26.81);Muscle weakness (generalized) (M62.81);Pain Pain - Right/Left: Left Pain - part of body: Shoulder;Arm   Activity Tolerance Patient limited by fatigue   Patient Left in bed;with call bell/phone within reach   Nurse Communication Mobility status;Precautions    Functional Assessment Tool Used: AM-PAC 6 Clicks Daily Activity   Time: 1610-9604 OT Time Calculation (min): 25 min  Charges: OT G-codes **NOT FOR INPATIENT CLASS** Functional Assessment Tool Used: AM-PAC 6 Clicks Daily Activity OT General Charges $OT Visit: 1 Visit OT Treatments $Self Care/Home Management : 8-22 mins $Therapeutic Activity: 8-22 mins     Galen Manila 07/18/2017, 12:59 PM

## 2017-07-19 DIAGNOSIS — F329 Major depressive disorder, single episode, unspecified: Secondary | ICD-10-CM

## 2017-07-19 DIAGNOSIS — M1990 Unspecified osteoarthritis, unspecified site: Secondary | ICD-10-CM

## 2017-07-19 DIAGNOSIS — R0789 Other chest pain: Principal | ICD-10-CM

## 2017-07-19 DIAGNOSIS — E785 Hyperlipidemia, unspecified: Secondary | ICD-10-CM

## 2017-07-19 DIAGNOSIS — I469 Cardiac arrest, cause unspecified: Secondary | ICD-10-CM

## 2017-07-19 LAB — GLUCOSE, CAPILLARY
GLUCOSE-CAPILLARY: 106 mg/dL — AB (ref 65–99)
GLUCOSE-CAPILLARY: 112 mg/dL — AB (ref 65–99)
Glucose-Capillary: 139 mg/dL — ABNORMAL HIGH (ref 65–99)
Glucose-Capillary: 122 mg/dL — ABNORMAL HIGH (ref 65–99)

## 2017-07-19 MED ORDER — SEVELAMER CARBONATE 800 MG PO TABS
2400.0000 mg | ORAL_TABLET | Freq: Three times a day (TID) | ORAL | Status: DC
  Administered 2017-07-19 – 2017-07-23 (×11): 2400 mg via ORAL
  Filled 2017-07-19 (×11): qty 3

## 2017-07-19 MED ORDER — DARBEPOETIN ALFA 40 MCG/0.4ML IJ SOSY
40.0000 ug | PREFILLED_SYRINGE | INTRAMUSCULAR | Status: DC
  Administered 2017-07-20: 40 ug via INTRAVENOUS
  Filled 2017-07-19: qty 0.4

## 2017-07-19 NOTE — Progress Notes (Addendum)
[facility] Progress Note   Subjective: Asking me to send him home. Upset because he still hasn't left the hospital. Says no one is doing anything, he just wants to go home. No C/O Chest pain.    Objective Vitals:   07/18/17 2106 07/18/17 2344 07/19/17 0515 07/19/17 0841  BP: 125/76  105/83 120/77  Pulse: 92  82   Resp: 16 16 14    Temp: 98.6 F (37 C)  98.1 F (36.7 C)   TempSrc: Oral  Oral   SpO2: 100%  100%   Weight:   90.5 kg (199 lb 9.6 oz)   Height:       Physical Exam General: WN,WD NAD Heart:S1,S2 No M/G/R Lungs: CTAB A/P Abdomen: active BS Extremities: No LE edema Dialysis Access: LUA AVG + bruit   Additional Objective Labs: Basic Metabolic Panel: Recent Labs  Lab 07/13/17 0720 07/15/17 0621 07/18/17 1355  NA 134* 136 136  K 4.1 4.0 3.8  CL 93* 94* 97*  CO2 26 26 24   GLUCOSE 105* 94 129*  BUN 41* 43* 53*  CREATININE 11.39* 11.60* 13.13*  CALCIUM 9.1 9.1 8.9  PHOS 5.1* 6.0* 6.3*   Liver Function Tests: Recent Labs  Lab 07/13/17 0720 07/15/17 0621 07/18/17 1355  ALBUMIN 3.7 3.9 3.8   No results for input(s): LIPASE, AMYLASE in the last 168 hours. CBC: Recent Labs  Lab 07/13/17 0720 07/15/17 0621 07/18/17 1355  WBC 9.1 6.0 6.2  NEUTROABS  --   --  2.6  HGB 9.4* 10.2* 9.0*  HCT 30.3* 31.9* 28.8*  MCV 94.7 93.8 92.9  PLT 446* 327 299   Blood Culture    Component Value Date/Time   SDES BLOOD RIGHT FOOT 06/24/2017 2255   SPECREQUEST  06/24/2017 2255    BOTTLES DRAWN AEROBIC ONLY Blood Culture results may not be optimal due to an inadequate volume of blood received in culture bottles   CULT  06/24/2017 2255    NO GROWTH 5 DAYS Performed at [facility], [address], [place], [place] [address]    REPTSTATUS 06/30/2017 FINAL 06/24/2017 2255    Cardiac Enzymes: No results for input(s): CKTOTAL, CKMB, CKMBINDEX, TROPONINI in the last 168 hours. CBG: Recent Labs  Lab 07/18/17 0745 07/18/17 1132 07/18/17 1843  07/18/17 2110 07/19/17 0746  GLUCAP 104* 120* 98 154* 106*   Iron Studies: No results for input(s): IRON, TIBC, TRANSFERRIN, FERRITIN in the last 72 hours. @lablastinr3 @ Studies/Results: No results found. Medications:  . aspirin EC  81 mg Oral Daily  . atorvastatin  80 mg Oral q1800  . calcitRIOL  0.5 mcg Oral Q M,W,F-HD  . clopidogrel  75 mg Oral Daily  . heparin injection (subcutaneous)  5,000 Units Subcutaneous Q8H  . insulin aspart  0-9 Units Subcutaneous TID WC  . latanoprost  1 drop Both Eyes Daily  . metoprolol tartrate  12.5 mg Oral BID  . pantoprazole  40 mg Oral Q0600  . sertraline  50 mg Oral QHS  . sevelamer carbonate  1,600 mg Oral TID WC     Dialysis Orders: [facility] 4 hoursEDW 98 kg MWF  HD Bath2K/ 2 Ca, DialyzerF180 BFR 400 mL/ min, Heparin5000 u bolus. AccessLUE AVG. -Mircera200 q 2 wks (last 5/1)  -Venofer50 q wk  -Calcitriol 0.5TIW  Assessment/Plan: 1. Atypical chest pain- at site of AICD in left mid-axillary region. Device/implant wound check per EP today. No issues. Troponin 0.03 flat trend. VQ scan negative 2. ESRDMWF via LUA AVG. HD tomorrow  if still here. Usual heparin dose.  3. Anemia:Hgb 9.0 Last ESA 07/06/17. Aranesp with HD tomorrow if still here.  4. CKD-MBD:stable cont with calcitriol and renagel Ca 8.9 Phos 6.3 Increase binder dose. 5. Nutrition:Albumin 3.8 renal/carb mod diet 6. Hypertension/Volume: HD 07/18/17 Kg 93.3 kg Net UF 3.0 Post wt 90 kg . Needs lower EDW on DC.  7. DM per primary svc 8. Depression  Disposition- Has been discharged to SNF/[facility]. Follow up this PM and be sure he actually leaves. Will need HD orders for tomorrow if still here.   Rita H. Brown NP-C 07/19/2017, 9:55 AM  [facility] [phone]  Pt seen, examined and agree w A/P as above.  Vinson Moselle MD [employer] pager [phone]   07/19/2017, 10:35 AM

## 2017-07-19 NOTE — Progress Notes (Signed)
CSW followed up with [facility] in [place] this morning. Spoke to [employer] - she has sent clinicals to [facility] facility/nephrology for review and will follow up with CSW.   CSW also spoke to patient's daughter, Kendrick Fries, via phone. CSW discussed patient's concerns about bringing his television to [facility]. Patient expressing that he will not go to the SNF if he can't bring his television. Kendrick Fries indicated she would try to arrange to get patient's TV and other belongings to [place], but she does not have a car.  CSW met with patient at bedside. Patient visibly anxious and in discussion, preoccupied with his TV. Patient stating he will just go home if he can't get his belongings to [place]. CSW did reflect on patient's multiple readmissions and explained the reason for lengthy time involved in arranging the transfer to [place]. CSW attempted to reflect to patient that we would end up in the same situation if he returns home and does not go to the SNF. Patient continued to be preoccupied with TV. Patient indicated he could pay for transportation to [place] if needed so he can go get his things first. Patient stated he does not trust his daughter to make the arrangements. CSW consulting with clinical supervisor regarding transportation arrangements for patient.  Also continue to await admission at [facility] in [place]. CSW to follow.  Estanislado Emms, [place]

## 2017-07-19 NOTE — Progress Notes (Signed)
Physical Therapy Treatment Patient Details Name: Richard Barnett MRN: [id number] DOB: 05-05-1955 Today's Date: 07/19/2017    History of Present Illness Pt is a 62 y.o. Alpha with recent admission for cardiac arrest s/p pacemaker placement 06/29/17, now admitted 07/07/17 with c/o chest pain likely associated with recent resuscitative efforts. PMH includes ESRD (HD MWF), HTN, CKD, CHF, GERD, DVT, DM.   PT Comments    Pt progressing with mobility. Continues to demonstrate decreased gait speed and instability with higher level balance, putting patient at increased risk for falls. Poor safety awareness and insight into deficits. Will follow acutely.  Follow Up Recommendations  SNF;Supervision/Assistance - 24 hour     Equipment Recommendations  None recommended by PT    Recommendations for Other Services       Precautions / Restrictions Precautions Precautions: Fall;ICD/Pacemaker Restrictions Other Position/Activity Restrictions: LUE pacemaker precautions    Mobility  Bed Mobility               General bed mobility comments: Received pt sitting EOB  Transfers Overall transfer level: Needs assistance Equipment used: None Transfers: Sit to/from Stand Sit to Stand: Supervision            Ambulation/Gait Ambulation/Gait assistance: Min guard Ambulation Distance (Feet): 200 Feet Assistive device: None Gait Pattern/deviations: Step-through pattern;Decreased stride length Gait velocity: Decreased Gait velocity interpretation: <1.8 ft/sec, indicate of risk for recurrent falls General Gait Details: Slow, slightly unsteady amb with intermittent min guard for balance and safety. 2x increased lateral sway with self-corrected LOB   Stairs             Wheelchair Mobility    Modified Rankin (Stroke Patients Only)       Balance Overall balance assessment: Needs assistance Sitting-balance support: Feet supported;No upper extremity supported Sitting balance-Leahy  Scale: Good     Standing balance support: No upper extremity supported;During functional activity Standing balance-Leahy Scale: Fair               High level balance activites: Side stepping;Backward walking;Direction changes;Turns;Sudden stops High Level Balance Comments: Slight instability with lateral sway during higher level balance, pt able to correct 2x LOB            Cognition Arousal/Alertness: Awake/alert Behavior During Therapy: WFL for tasks assessed/performed Overall Cognitive Status: No family/caregiver present to determine baseline cognitive functioning Area of Impairment: Memory;Problem solving;Following commands;Safety/judgement                     Memory: Decreased short-term memory;Decreased recall of precautions Following Commands: Follows multi-step commands with increased time Safety/Judgement: Decreased awareness of safety;Decreased awareness of deficits   Problem Solving: Slow processing;Requires verbal cues        Exercises      General Comments        Pertinent Vitals/Pain Pain Assessment: No/denies pain    Home Living                      Prior Function            PT Goals (current goals can now be found in the care plan section) Acute Rehab PT Goals Patient Stated Goal: get home PT Goal Formulation: With patient Time For Goal Achievement: 07/22/17 Potential to Achieve Goals: Good Progress towards PT goals: Progressing toward goals    Frequency    Min 2X/week      PT Plan Current plan remains appropriate    Co-evaluation  AM-PAC PT "6 Clicks" Daily Activity  Outcome Measure  Difficulty turning over in bed (including adjusting bedclothes, sheets and blankets)?: A Little Difficulty moving from lying on back to sitting on the side of the bed? : A Little Difficulty sitting down on and standing up from a chair with arms (e.g., wheelchair, bedside commode, etc,.)?: A Little Help needed  moving to and from a bed to chair (including a wheelchair)?: A Little Help needed walking in hospital room?: A Little Help needed climbing 3-5 steps with a railing? : A Little 6 Click Score: 18    End of Session Equipment Utilized During Treatment: Gait belt Activity Tolerance: Patient tolerated treatment well Patient left: in bed;with call bell/phone within reach Nurse Communication: Mobility status PT Visit Diagnosis: Other abnormalities of gait and mobility (R26.89);Difficulty in walking, not elsewhere classified (R26.2);Muscle weakness (generalized) (M62.81)     Time: 3382-5053 PT Time Calculation (min) (ACUTE ONLY): 13 min  Charges:  $Gait Training: 8-22 mins                    G Codes:      Ina Homes, PT, DPT Acute Rehab Services  Pager: [phone]  Malachy Chamber 07/19/2017, 3:19 PM

## 2017-07-19 NOTE — Progress Notes (Signed)
PROGRESS NOTE    Richard Barnett  ZOX:[id number] DOB: 1955/04/09 DOA: 07/07/2017 PCP: Care, [facility]   Brief Narrative: 62 y.o.malewithwith a history of CAD status post stenting in November 2018, and a recent admission for cardiac arrest, with admission from 4/19/2019through 07/04/2017, status post cardiac catheterization on 06/28/2017, showing diffuse disease in the LAD with 40 to 50% narrowing, subsequently placing an ICD on 06/29/2017, and discharged in stable condition. At the time, skilled nursing facility was recommended, but patient chose to be discharged to home. Since his discharge, the patient has been on and off experiencing substernal chest pain. Patient presented this time with sudden onset of severe chest pain, with associated diaphoresis.  Patient was then admitted to the hospital and following conditions were addressed during hospitalization,  Assessment & Plan:   Principal Problem:   Chest pain Active Problems:   CAD (coronary artery disease)   Hypertension   Hyperlipidemia   Prediabetes   Anemia of chronic renal failure   Secondary hyperparathyroidism of renal origin (HCC)   Arthritis   Depression   Deep vein thrombosis (HCC)   Diabetes mellitus (HCC)   NSTEMI (non-ST elevated myocardial infarction) (HCC)   GERD (gastroesophageal reflux disease)   ESRD on dialysis (HCC)   Major depressive disorder, recurrent episode, severe (HCC)   Adjustment disorder with mixed disturbance of emotions and conduct   Cardiac arrest (HCC)   Chest pain syndrome  Atypical chest pain.  Cardiology on board.  No other intervention planned  Status post cardiac arrest, status post ICD placement on 06/29/2017.  On aspirin Lipitor Plavix.  Chronic diastolic congestive heart failure.  Compensated at this time.  End-stage renal disease on hemodialysis Monday Wednesday Friday.  Nephrology on board. compensated  Hypertension.  Closely monitor.  Persistent to be  stable.  Anemia of chronic kidney disease.  Closely monitor.  History of depression, on sertraline  GERD on PPI.  Remote history of DVT.  Recent VQ scan is negative.  On aspirin and Plavix.  Diabetes mellitus.  Diabetic diet sliding scale insulin, Accu-Cheks.  We will monitor.  Debility deconditioning -awaiting for skilled nursing facility placement  DVT prophylaxis: Heparin Code Status: Full code Family Communication: No family available Disposition Plan: Skilled nursing facility in [place] needs outpatient hemodialysis arrangement.  Social workers on Insurance risk surveyor: Nephrology,  Procedures: Hemodialysis  Antimicrobials: None  Subjective: Patient denies any chest pain, palpitations or shortness of breath.  Objective: Vitals:   07/18/17 2106 07/18/17 2344 07/19/17 0515 07/19/17 0841  BP: 125/76  105/83 120/77  Pulse: 92  82   Resp: 16 16 14    Temp: 98.6 F (37 C)  98.1 F (36.7 C)   TempSrc: Oral  Oral   SpO2: 100%  100%   Weight:   90.5 kg (199 lb 9.6 oz)   Height:        Intake/Output Summary (Last 24 hours) at 07/19/2017 0952 Last data filed at 07/18/2017 2100 Gross per 24 hour  Intake 120 ml  Output 3000 ml  Net -2880 ml   Filed Weights   07/18/17 1345 07/18/17 1756 07/19/17 0515  Weight: 93.3 kg (205 lb 11 oz) 90 kg (198 lb 6.6 oz) 90.5 kg (199 lb 9.6 oz)    Examination:  General exam: Appears calm and comfortable ,Not in distress,average built HEENT:PERRL,Oral mucosa moist, Ear/Nose normal on gross exam Respiratory system: Bilateral equal air entry, normal vesicular breath sounds, no wheezes or crackles  Cardiovascular system: S1 & S2  heard, RRR. No JVD, murmurs, rubs, gallops or clicks. No pedal edema.  Left chest wall AICD Gastrointestinal system: Abdomen is nondistended, soft and nontender. No organomegaly or masses felt. Normal bowel sounds heard. Central nervous system: Alert and oriented. No focal neurological deficits. Extremities: No  edema, no clubbing ,no cyanosis, distal peripheral pulses palpable.  Upper extremity fistula Skin: No rashes, lesions or ulcers,no icterus ,no pallor MSK: Normal muscle bulk,tone ,power Psychiatry: Judgement and insight appear normal. Mood & affect appropriate.   Data Reviewed: I have personally reviewed following labs and imaging studies  CBC: Recent Labs  Lab 07/13/17 0720 07/15/17 0621 07/18/17 1355  WBC 9.1 6.0 6.2  NEUTROABS  --   --  2.6  HGB 9.4* 10.2* 9.0*  HCT 30.3* 31.9* 28.8*  MCV 94.7 93.8 92.9  PLT 446* 327 299   Basic Metabolic Panel: Recent Labs  Lab 07/13/17 0720 07/15/17 0621 07/18/17 1355  NA 134* 136 136  K 4.1 4.0 3.8  CL 93* 94* 97*  CO2 26 26 24   GLUCOSE 105* 94 129*  BUN 41* 43* 53*  CREATININE 11.39* 11.60* 13.13*  CALCIUM 9.1 9.1 8.9  PHOS 5.1* 6.0* 6.3*   GFR: Estimated Creatinine Clearance: 6.3 mL/min (A) (by C-G formula based on SCr of 13.13 mg/dL (H)). Liver Function Tests: Recent Labs  Lab 07/13/17 0720 07/15/17 0621 07/18/17 1355  ALBUMIN 3.7 3.9 3.8   No results for input(s): LIPASE, AMYLASE in the last 168 hours. No results for input(s): AMMONIA in the last 168 hours. Coagulation Profile: No results for input(s): INR, PROTIME in the last 168 hours. Cardiac Enzymes: No results for input(s): CKTOTAL, CKMB, CKMBINDEX, TROPONINI in the last 168 hours. BNP (last 3 results) No results for input(s): PROBNP in the last 8760 hours. HbA1C: No results for input(s): HGBA1C in the last 72 hours. CBG: Recent Labs  Lab 07/18/17 0745 07/18/17 1132 07/18/17 1843 07/18/17 2110 07/19/17 0746  GLUCAP 104* 120* 98 154* 106*   Lipid Profile: No results for input(s): CHOL, HDL, LDLCALC, TRIG, CHOLHDL, LDLDIRECT in the last 72 hours. Thyroid Function Tests: No results for input(s): TSH, T4TOTAL, FREET4, T3FREE, THYROIDAB in the last 72 hours. Anemia Panel: No results for input(s): VITAMINB12, FOLATE, FERRITIN, TIBC, IRON, RETICCTPCT in  the last 72 hours. Sepsis Labs: No results for input(s): PROCALCITON, LATICACIDVEN in the last 168 hours.  No results found for this or any previous visit (from the past 240 hour(s)).    Radiology Studies: No results found.  Scheduled Meds: . aspirin EC  81 mg Oral Daily  . atorvastatin  80 mg Oral q1800  . calcitRIOL  0.5 mcg Oral Q M,W,F-HD  . clopidogrel  75 mg Oral Daily  . heparin injection (subcutaneous)  5,000 Units Subcutaneous Q8H  . insulin aspart  0-9 Units Subcutaneous TID WC  . latanoprost  1 drop Both Eyes Daily  . metoprolol tartrate  12.5 mg Oral BID  . pantoprazole  40 mg Oral Q0600  . sertraline  50 mg Oral QHS  . sevelamer carbonate  1,600 mg Oral TID WC   Continuous Infusions:   LOS: 12 days    Time spent: More than 50% of that time was spent in counseling and/or coordination of care.   Joycelyn Das, MD [facility] Pager [phone]  If 7PM-7AM, please contact night-coverage [email or web address] Password [facility] 07/19/2017, 9:52 AM

## 2017-07-19 NOTE — Progress Notes (Signed)
CSW received call from [place] at [facility]. They requested additional clinicals- PT/OT notes. CSW faxed notes. Awaiting follow up.  RNCM assisted with contacting community supports for patient, as patient very concerned about getting his belongings down to [place].   CSW to follow.  Abigail Butts, LCSWA [phone]

## 2017-07-20 DIAGNOSIS — N189 Chronic kidney disease, unspecified: Secondary | ICD-10-CM

## 2017-07-20 DIAGNOSIS — E1122 Type 2 diabetes mellitus with diabetic chronic kidney disease: Secondary | ICD-10-CM

## 2017-07-20 DIAGNOSIS — N186 End stage renal disease: Secondary | ICD-10-CM

## 2017-07-20 DIAGNOSIS — Z794 Long term (current) use of insulin: Secondary | ICD-10-CM

## 2017-07-20 DIAGNOSIS — Z992 Dependence on renal dialysis: Secondary | ICD-10-CM

## 2017-07-20 DIAGNOSIS — D631 Anemia in chronic kidney disease: Secondary | ICD-10-CM

## 2017-07-20 LAB — RENAL FUNCTION PANEL
Albumin: 4 g/dL (ref 3.5–5.0)
Anion gap: 18 — ABNORMAL HIGH (ref 5–15)
BUN: 43 mg/dL — ABNORMAL HIGH (ref 6–20)
CO2: 22 mmol/L (ref 22–32)
Calcium: 8.9 mg/dL (ref 8.9–10.3)
Chloride: 93 mmol/L — ABNORMAL LOW (ref 101–111)
Creatinine, Ser: 11.84 mg/dL — ABNORMAL HIGH (ref 0.61–1.24)
GFR calc Af Amer: 5 mL/min — ABNORMAL LOW (ref >60)
GFR calc non Af Amer: 4 mL/min — ABNORMAL LOW (ref >60)
Glucose, Bld: 102 mg/dL — ABNORMAL HIGH (ref 65–99)
Phosphorus: 5.5 mg/dL — ABNORMAL HIGH (ref 2.5–4.6)
Potassium: 3.7 mmol/L (ref 3.5–5.1)
Sodium: 133 mmol/L — ABNORMAL LOW (ref 135–145)

## 2017-07-20 LAB — GLUCOSE, CAPILLARY
GLUCOSE-CAPILLARY: 198 mg/dL — AB (ref 65–99)
Glucose-Capillary: 106 mg/dL — ABNORMAL HIGH (ref 65–99)
Glucose-Capillary: 154 mg/dL — ABNORMAL HIGH (ref 65–99)
Glucose-Capillary: 125 mg/dL — ABNORMAL HIGH (ref 65–99)

## 2017-07-20 LAB — CBC
HCT: 32.3 % — ABNORMAL LOW (ref 39.0–52.0)
Hemoglobin: 10.5 g/dL — ABNORMAL LOW (ref 13.0–17.0)
MCH: 29.7 pg (ref 26.0–34.0)
MCHC: 32.5 g/dL (ref 30.0–36.0)
MCV: 91.5 fL (ref 78.0–100.0)
Platelets: 343 K/uL (ref 150–400)
RBC: 3.53 MIL/uL — ABNORMAL LOW (ref 4.22–5.81)
RDW: 16.9 % — ABNORMAL HIGH (ref 11.5–15.5)
WBC: 6.7 10*3/uL (ref 4.0–10.5)

## 2017-07-20 MED ORDER — LIDOCAINE HCL (PF) 1 % IJ SOLN: 5.0000 mL | INTRAMUSCULAR | Status: DC | PRN

## 2017-07-20 MED ORDER — ALTEPLASE 2 MG IJ SOLR: 2.0000 mg | Freq: Once | INTRAMUSCULAR | Status: DC | PRN

## 2017-07-20 MED ORDER — HEPARIN SODIUM (PORCINE) 1000 UNIT/ML DIALYSIS: 5000.0000 [IU] | Freq: Once | INTRAMUSCULAR | Status: DC

## 2017-07-20 MED ORDER — LIDOCAINE-PRILOCAINE 2.5-2.5 % EX CREA: 1.0000 "application " | TOPICAL_CREAM | CUTANEOUS | Status: DC | PRN

## 2017-07-20 MED ORDER — PENTAFLUOROPROP-TETRAFLUOROETH EX AERO: 1.0000 "application " | INHALATION_SPRAY | CUTANEOUS | Status: DC | PRN

## 2017-07-20 MED ORDER — SODIUM CHLORIDE 0.9 % IV SOLN: 100.0000 mL | INTRAVENOUS | Status: DC | PRN

## 2017-07-20 MED ORDER — HEPARIN SODIUM (PORCINE) 1000 UNIT/ML DIALYSIS: 1000.0000 [IU] | INTRAMUSCULAR | Status: DC | PRN

## 2017-07-20 MED ORDER — DARBEPOETIN ALFA 40 MCG/0.4ML IJ SOSY
PREFILLED_SYRINGE | INTRAMUSCULAR | Status: AC
  Administered 2017-07-20: 40 ug via INTRAVENOUS
  Filled 2017-07-20: qty 0.4

## 2017-07-20 MED ORDER — CALCITRIOL 0.5 MCG PO CAPS
ORAL_CAPSULE | ORAL | Status: AC
Start: 2017-07-20 — End: 2017-07-21
  Filled 2017-07-20: qty 1

## 2017-07-20 NOTE — Progress Notes (Signed)
[facility] Progress Note   Subjective: no new c/o's.   Objective Vitals:   07/19/17 2236 07/20/17 0604 07/20/17 0605 07/20/17 1222  BP:  108/75  120/86  Pulse: 90 77  95  Resp:    12  Temp:  98.2 F (36.8 C)  97.6 F (36.4 C)  TempSrc:  Oral  Oral  SpO2:  99%  100%  Weight:   91.5 kg (201 lb 11.2 oz)   Height:       Physical Exam General: WN,WD NAD Heart:S1,S2 No M/G/R Lungs: CTAB A/P Abdomen: active BS Extremities: No LE edema Dialysis Access: LUA AVG + bruit   Additional Objective Labs: Basic Metabolic Panel: Recent Labs  Lab 07/15/17 0621 07/18/17 1355  NA 136 136  K 4.0 3.8  CL 94* 97*  CO2 26 24  GLUCOSE 94 129*  BUN 43* 53*  CREATININE 11.60* 13.13*  CALCIUM 9.1 8.9  PHOS 6.0* 6.3*   Liver Function Tests: Recent Labs  Lab 07/15/17 0621 07/18/17 1355  ALBUMIN 3.9 3.8   No results for input(s): LIPASE, AMYLASE in the last 168 hours. CBC: Recent Labs  Lab 07/15/17 0621 07/18/17 1355  WBC 6.0 6.2  NEUTROABS  --  2.6  HGB 10.2* 9.0*  HCT 31.9* 28.8*  MCV 93.8 92.9  PLT 327 299   Blood Culture    Component Value Date/Time   SDES BLOOD RIGHT FOOT 06/24/2017 2255   SPECREQUEST  06/24/2017 2255    BOTTLES DRAWN AEROBIC ONLY Blood Culture results may not be optimal due to an inadequate volume of blood received in culture bottles   CULT  06/24/2017 2255    NO GROWTH 5 DAYS Performed at [facility], [address], [place], [place] [address]    REPTSTATUS 06/30/2017 FINAL 06/24/2017 2255    Cardiac Enzymes: No results for input(s): CKTOTAL, CKMB, CKMBINDEX, TROPONINI in the last 168 hours. CBG: Recent Labs  Lab 07/19/17 1113 07/19/17 1648 07/19/17 2136 07/20/17 0720 07/20/17 1122  GLUCAP 139* 122* 112* 106* 154*   Iron Studies: No results for input(s): IRON, TIBC, TRANSFERRIN, FERRITIN in the last 72 hours. @lablastinr3 @ Studies/Results: No results found. Medications:  . aspirin EC  81 mg Oral Daily  .  atorvastatin  80 mg Oral q1800  . calcitRIOL  0.5 mcg Oral Q M,W,F-HD  . clopidogrel  75 mg Oral Daily  . darbepoetin (ARANESP) injection - DIALYSIS  40 mcg Intravenous Q Wed-HD  . heparin injection (subcutaneous)  5,000 Units Subcutaneous Q8H  . insulin aspart  0-9 Units Subcutaneous TID WC  . latanoprost  1 drop Both Eyes Daily  . metoprolol tartrate  12.5 mg Oral BID  . pantoprazole  40 mg Oral Q0600  . sertraline  50 mg Oral QHS  . sevelamer carbonate  2,400 mg Oral TID WC     Dialysis Orders: [facility] MWF 4h   98kg  2/2 bath Hep 5000  LUE AVG  -Mircera200 q 2 wks (last 5/1)  -Venofer50 q wk  -Calcitriol 0.5TIW  Assessment: 1. Debility/ memory impairment - w/ cognitive and physical decline after recent cardiac arrest; presumed ABI related to arrest. Can't take care of himself, SW's attempting SNF placement but not going well.  2. Atypical chest pain- at site of AICD in left mid-axillary region. Device/implant wound check per EP today. No issues. Troponin 0.03 flat trend. VQ scan negative 3. ESRDMWF via LUA AVG. HD today usual heparin dose.  4. Anemia:Hgb 9.0 Last ESA 07/06/17. Aranesp with  HD today 5. CKD-MBD:stable cont with calcitriol and renagel Ca 8.9 Phos 6.3 Increase binder dose. 6. Nutrition:Albumin 3.8 renal/carb mod diet 7. Hypertension/Volume: has lost weight 98 > 91kg here.  Lower edw at dc.  8. DM per primary svc 9. Depression  Plan - HD today   Vinson Moselle MD [facility] pager [phone]   07/20/2017, 2:09 PM

## 2017-07-20 NOTE — Progress Notes (Signed)
CSW left voicemail message for Richard Barnett at [facility] to request update on transfer of patient to [facility] for HD. Awaiting return call. CSW to follow and support with discharge to [facility] when patient's HD at new center is arranged.  Abigail Butts, LCSWA [phone]

## 2017-07-20 NOTE — Progress Notes (Signed)
Patient c/o abdominal pain all around abdomen, no radiation with pain.  Gave patient a GI cocktail for relief.  I will continue to monitor patient.

## 2017-07-20 NOTE — Progress Notes (Addendum)
PROGRESS NOTE  Shahzain GLENMORE LUIZ WYS:[id number] DOB: Apr 13, 1955 DOA: 07/07/2017 PCP: Care, [facility]uss[facility] Total Access   LOS: 13 days   Brief Narrative / Interim history: 62 year old [place] with history of end-stage renal disease, coronary artery disease with prior stenting in 2018, recent admission for cardiac arrest, admission from April 19 April 29th 2019 when he underwent a cardiac cath on April 23 which showed diffuse disease in the LAD with 40 to 50% narrowing, currently has an ICD in place placed on April 24, discharged in stable condition was readmitted with chest pain.  This is associated with diaphoresis.  Cardiology (EP) saw patient last week.  Nephrology was consulted.  Assessment & Plan: Principal Problem:   Chest pain Active Problems:   CAD (coronary artery disease)   Hypertension   Hyperlipidemia   Prediabetes   Anemia of chronic renal failure   Secondary hyperparathyroidism of renal origin (HCC)   Arthritis   Depression   Deep vein thrombosis (HCC)   Diabetes mellitus (HCC)   NSTEMI (non-ST elevated myocardial infarction) (HCC)   GERD (gastroesophageal reflux disease)   ESRD on dialysis (HCC)   Major depressive disorder, recurrent episode, severe (HCC)   Adjustment disorder with mixed disturbance of emotions and conduct   Cardiac arrest [facility])   Chest pain syndrome   Coronary artery disease -Cardiology evaluated, his pain is atypical, no further interventions planned at this point -He is status post cardiac arrest and has an ICD which was placed on 06/29/2017.  Continue aspirin, Lipitor, Plavix.  Wound check was done by EP last week  Chronic diastolic CHF -He appears euvolemic, he is compensated.  Volume management per dialysis  End-stage renal disease on IHD MWF -Nephrology consulted  Hypertension -Blood pressure well controlled, continue metoprolol  Hyperlipidemia -Continue statin  Depression -Continue sertraline  Remote history of DVT -Recent VQ scan is  negative, on aspirin and Plavix   Type 2 diabetes mellitus -Sliding scale insulin  Debility / deconditioning -Patient felt to possibly might have had a degree of ABI, will needs placement.  He has a bed in [facility], establishing outpatient dialysis is still pending at this time   DVT prophylaxis: heparin Code Status: Full code Family Communication: no family at bedside Disposition Plan: Awaiting placement  Consultants:   Nephrology  Cardiology  Procedures:   HD  Antimicrobials:  None    Subjective: -He is feeling well, has no complaints, has no chest pain, no shortness of breath, no abdominal pain, nausea or vomiting  Objective: Vitals:   07/19/17 2236 07/20/17 0604 07/20/17 0605 07/20/17 1222  BP:  108/75  120/86  Pulse: 90 77  95  Resp:    12  Temp:  98.2 F (36.8 C)  97.6 F (36.4 C)  TempSrc:  Oral  Oral  SpO2:  99%  100%  Weight:   91.5 kg (201 lb 11.2 oz)   Height:        Intake/Output Summary (Last 24 hours) at 07/20/2017 1430 Last data filed at 07/20/2017 1255 Gross per 24 hour  Intake 708 ml  Output -  Net 708 ml   Filed Weights   07/18/17 1756 07/19/17 0515 07/20/17 0605  Weight: 90 kg (198 lb 6.6 oz) 90.5 kg (199 lb 9.6 oz) 91.5 kg (201 lb 11.2 oz)    Examination:  Constitutional: NAD Eyes:  lids and conjunctivae normal Respiratory: clear to auscultation bilaterally, no wheezing, no crackles. Normal respiratory effort. Cardiovascular: Regular rate and rhythm, no murmurs / rubs /  gallops. No LE edema. Abdomen: no tenderness. Bowel sounds positive.  Skin: no rashes Neurologic: CN 2-12 grossly intact. Strength 5/5 in all 4.    Data Reviewed: I have independently reviewed following labs and imaging studies   CBC: Recent Labs  Lab 07/15/17 0621 07/18/17 1355  WBC 6.0 6.2  NEUTROABS  --  2.6  HGB 10.2* 9.0*  HCT 31.9* 28.8*  MCV 93.8 92.9  PLT 327 299   Basic Metabolic Panel: Recent Labs  Lab 07/15/17 0621  07/18/17 1355  NA 136 136  K 4.0 3.8  CL 94* 97*  CO2 26 24  GLUCOSE 94 129*  BUN 43* 53*  CREATININE 11.60* 13.13*  CALCIUM 9.1 8.9  PHOS 6.0* 6.3*   GFR: Estimated Creatinine Clearance: 6.3 mL/min (A) (by C-G formula based on SCr of 13.13 mg/dL (H)). Liver Function Tests: Recent Labs  Lab 07/15/17 0621 07/18/17 1355  ALBUMIN 3.9 3.8   No results for input(s): LIPASE, AMYLASE in the last 168 hours. No results for input(s): AMMONIA in the last 168 hours. Coagulation Profile: No results for input(s): INR, PROTIME in the last 168 hours. Cardiac Enzymes: No results for input(s): CKTOTAL, CKMB, CKMBINDEX, TROPONINI in the last 168 hours. BNP (last 3 results) No results for input(s): PROBNP in the last 8760 hours. HbA1C: No results for input(s): HGBA1C in the last 72 hours. CBG: Recent Labs  Lab 07/19/17 1113 07/19/17 1648 07/19/17 2136 07/20/17 0720 07/20/17 1122  GLUCAP 139* 122* 112* 106* 154*   Lipid Profile: No results for input(s): CHOL, HDL, LDLCALC, TRIG, CHOLHDL, LDLDIRECT in the last 72 hours. Thyroid Function Tests: No results for input(s): TSH, T4TOTAL, FREET4, T3FREE, THYROIDAB in the last 72 hours. Anemia Panel: No results for input(s): VITAMINB12, FOLATE, FERRITIN, TIBC, IRON, RETICCTPCT in the last 72 hours. Urine analysis:    Component Value Date/Time   COLORURINE YELLOW 07/07/2017 2252   APPEARANCEUR CLEAR 07/07/2017 2252   LABSPEC 1.009 07/07/2017 2252   PHURINE 9.0 (H) 07/07/2017 2252   GLUCOSEU NEGATIVE 07/07/2017 2252   HGBUR NEGATIVE 07/07/2017 2252   BILIRUBINUR NEGATIVE 07/07/2017 2252   KETONESUR NEGATIVE 07/07/2017 2252   PROTEINUR >=300 (A) 07/07/2017 2252   UROBILINOGEN 0.2 02/21/2015 0955   NITRITE NEGATIVE 07/07/2017 2252   LEUKOCYTESUR NEGATIVE 07/07/2017 2252   Sepsis Labs: Invalid input(s): PROCALCITONIN, LACTICIDVEN  No results found for this or any previous visit (from the past 240 hour(s)).    Radiology Studies: No  results found.   Scheduled Meds: . aspirin EC  81 mg Oral Daily  . atorvastatin  80 mg Oral q1800  . calcitRIOL  0.5 mcg Oral Q M,W,F-HD  . clopidogrel  75 mg Oral Daily  . darbepoetin (ARANESP) injection - DIALYSIS  40 mcg Intravenous Q Wed-HD  . heparin injection (subcutaneous)  5,000 Units Subcutaneous Q8H  . heparin  5,000 Units Dialysis Once in dialysis  . insulin aspart  0-9 Units Subcutaneous TID WC  . latanoprost  1 drop Both Eyes Daily  . metoprolol tartrate  12.5 mg Oral BID  . pantoprazole  40 mg Oral Q0600  . sertraline  50 mg Oral QHS  . sevelamer carbonate  2,400 mg Oral TID WC   Continuous Infusions: . sodium chloride    . sodium chloride       Pamella Pert, MD, PhD [facility] Pager [phone]  If 7PM-7AM, please contact night-coverage [email or web address] Password [email or web address] 07/20/2017, 2:30 PM

## 2017-07-21 LAB — GLUCOSE, CAPILLARY
GLUCOSE-CAPILLARY: 115 mg/dL — AB (ref 65–99)
Glucose-Capillary: 113 mg/dL — ABNORMAL HIGH (ref 65–99)
Glucose-Capillary: 114 mg/dL — ABNORMAL HIGH (ref 65–99)
Glucose-Capillary: 152 mg/dL — ABNORMAL HIGH (ref 65–99)

## 2017-07-21 NOTE — Progress Notes (Signed)
PROGRESS NOTE  Richard Barnett OMV:[id number] DOB: 11-Aug-1955 DOA: 07/07/2017 PCP: [facility]   LOS: 14 days   Brief Narrative / Interim history: 62 year old Richard Barnett with history of end-stage renal disease, coronary artery disease with prior stenting in 2018, recent admission for cardiac arrest, admission from April 19 April 29th 2019 when he underwent a cardiac cath on April 23 which showed diffuse disease in the LAD with 40 to 50% narrowing, currently has an ICD in place placed on April 24, discharged in stable condition was readmitted with chest pain.  This is associated with diaphoresis.  Cardiology (EP) saw patient last week.  Nephrology was consulted.  Assessment & Plan: Principal Problem:   Chest pain Active Problems:   CAD (coronary artery disease)   Hypertension   Hyperlipidemia   Prediabetes   Anemia of chronic renal failure   Secondary hyperparathyroidism of renal origin (HCC)   Arthritis   Depression   Deep vein thrombosis (HCC)   Diabetes mellitus (HCC)   NSTEMI (non-ST elevated myocardial infarction) (HCC)   GERD (gastroesophageal reflux disease)   ESRD on dialysis (HCC)   Major depressive disorder, recurrent episode, severe (HCC)   Adjustment disorder with mixed disturbance of emotions and conduct   Cardiac arrest [facility])   Chest pain syndrome   Coronary artery disease -Cardiology evaluated, his pain is atypical, no further interventions planned at this point -He is status post cardiac arrest and has an ICD which was placed on 06/29/2017.  Continue aspirin, Lipitor, Plavix.  Wound check was done by EP last week -Stable, no chest pain  Chronic diastolic CHF -He appears euvolemic, he is compensated.  Volume management per dialysis  End-stage renal disease on IHD MWF -Nephrology consulted  Hypertension -Blood pressure well controlled, continue metoprolol  Hyperlipidemia -Continue statin  Depression -Continue sertraline  Remote history of  DVT -Recent VQ scan is negative, on aspirin and Plavix   Type 2 diabetes mellitus -Sliding scale insulin  Debility / deconditioning -Patient felt to possibly might have had a degree of ABI, will needs placement.  He has a bed in [facility], establishing outpatient dialysis is still pending at this time -Placement is pending   DVT prophylaxis: heparin Code Status: Full code Family Communication: no family at bedside Disposition Plan: Awaiting placement  Consultants:   Nephrology  Cardiology  Procedures:   HD  Antimicrobials:  None    Subjective: - no chest pain, shortness of breath, no abdominal pain, nausea or vomiting.   Objective: Vitals:   07/20/17 1903 07/20/17 2124 07/20/17 2343 07/21/17 0625  BP: 102/69 131/78  112/77  Pulse: (!) 110 (!) 102 (!) 105 89  Resp: 18     Temp: 99.7 F (37.6 C) 98 F (36.7 C)  98.4 F (36.9 C)  TempSrc: Oral Oral  Oral  SpO2: 100% 99%  100%  Weight:    89.6 kg (197 lb 9.6 oz)  Height:        Intake/Output Summary (Last 24 hours) at 07/21/2017 1208 Last data filed at 07/21/2017 0900 Gross per 24 hour  Intake 540 ml  Output 2400 ml  Net -1860 ml   Filed Weights   07/20/17 1330 07/20/17 1730 07/21/17 0625  Weight: 91.9 kg (202 lb 9.6 oz) 89.5 kg (197 lb 5 oz) 89.6 kg (197 lb 9.6 oz)    Examination:  Constitutional: NAD Respiratory: CTA biL Cardiovascular: RRR  Data Reviewed: I have independently reviewed following labs and imaging studies   CBC:  Recent Labs  Lab 07/15/17 0621 07/18/17 1355 07/20/17 1527  WBC 6.0 6.2 6.7  NEUTROABS  --  2.6  --   HGB 10.2* 9.0* 10.5*  HCT 31.9* 28.8* 32.3*  MCV 93.8 92.9 91.5  PLT 327 299 343   Basic Metabolic Panel: Recent Labs  Lab 07/15/17 0621 07/18/17 1355 07/20/17 1526  NA 136 136 133*  K 4.0 3.8 3.7  CL 94* 97* 93*  CO2 26 24 22   GLUCOSE 94 129* 102*  BUN 43* 53* 43*  CREATININE 11.60* 13.13* 11.84*  CALCIUM 9.1 8.9 8.9  PHOS 6.0* 6.3* 5.5*     GFR: Estimated Creatinine Clearance: 6.9 mL/min (A) (by C-G formula based on SCr of 11.84 mg/dL (H)). Liver Function Tests: Recent Labs  Lab 07/15/17 0621 07/18/17 1355 07/20/17 1526  ALBUMIN 3.9 3.8 4.0   No results for input(s): LIPASE, AMYLASE in the last 168 hours. No results for input(s): AMMONIA in the last 168 hours. Coagulation Profile: No results for input(s): INR, PROTIME in the last 168 hours. Cardiac Enzymes: No results for input(s): CKTOTAL, CKMB, CKMBINDEX, TROPONINI in the last 168 hours. BNP (last 3 results) No results for input(s): PROBNP in the last 8760 hours. HbA1C: No results for input(s): HGBA1C in the last 72 hours. CBG: Recent Labs  Lab 07/20/17 1122 07/20/17 1858 07/20/17 2122 07/21/17 0751 07/21/17 1059  GLUCAP 154* 198* 125* 113* 114*   Lipid Profile: No results for input(s): CHOL, HDL, LDLCALC, TRIG, CHOLHDL, LDLDIRECT in the last 72 hours. Thyroid Function Tests: No results for input(s): TSH, T4TOTAL, FREET4, T3FREE, THYROIDAB in the last 72 hours. Anemia Panel: No results for input(s): VITAMINB12, FOLATE, FERRITIN, TIBC, IRON, RETICCTPCT in the last 72 hours. Urine analysis:    Component Value Date/Time   COLORURINE YELLOW 07/07/2017 2252   APPEARANCEUR CLEAR 07/07/2017 2252   LABSPEC 1.009 07/07/2017 2252   PHURINE 9.0 (H) 07/07/2017 2252   GLUCOSEU NEGATIVE 07/07/2017 2252   HGBUR NEGATIVE 07/07/2017 2252   BILIRUBINUR NEGATIVE 07/07/2017 2252   KETONESUR NEGATIVE 07/07/2017 2252   PROTEINUR >=300 (A) 07/07/2017 2252   UROBILINOGEN 0.2 02/21/2015 0955   NITRITE NEGATIVE 07/07/2017 2252   LEUKOCYTESUR NEGATIVE 07/07/2017 2252   Sepsis Labs: Invalid input(s): PROCALCITONIN, LACTICIDVEN  No results found for this or any previous visit (from the past 240 hour(s)).    Radiology Studies: No results found.   Scheduled Meds: . aspirin EC  81 mg Oral Daily  . atorvastatin  80 mg Oral q1800  . calcitRIOL  0.5 mcg Oral Q  M,W,F-HD  . clopidogrel  75 mg Oral Daily  . darbepoetin (ARANESP) injection - DIALYSIS  40 mcg Intravenous Q Wed-HD  . heparin injection (subcutaneous)  5,000 Units Subcutaneous Q8H  . heparin  5,000 Units Dialysis Once in dialysis  . insulin aspart  0-9 Units Subcutaneous TID WC  . latanoprost  1 drop Both Eyes Daily  . metoprolol tartrate  12.5 mg Oral BID  . pantoprazole  40 mg Oral Q0600  . sertraline  50 mg Oral QHS  . sevelamer carbonate  2,400 mg Oral TID WC   Continuous Infusions: . sodium chloride    . sodium chloride       Pamella Pert, MD, PhD [facility] Pager [phone]  If 7PM-7AM, please contact night-coverage [email or web address] Password [facility] 07/21/2017, 12:08 PM

## 2017-07-21 NOTE — Progress Notes (Signed)
[facility] Progress Note   Subjective: no new c/o's.   Objective Vitals:   07/20/17 1903 07/20/17 2124 07/20/17 2343 07/21/17 0625  BP: 102/69 131/78  112/77  Pulse: (!) 110 (!) 102 (!) 105 89  Resp: 18     Temp: 99.7 F (37.6 C) 98 F (36.7 C)  98.4 F (36.9 C)  TempSrc: Oral Oral  Oral  SpO2: 100% 99%  100%  Weight:    89.6 kg (197 lb 9.6 oz)  Height:       Physical Exam General: WN,WD NAD Heart:S1,S2 No M/G/R Lungs: CTAB A/P Abdomen: active BS Extremities: No LE edema Dialysis Access: LUA AVG + bruit   Additional Objective Labs: Basic Metabolic Panel: Recent Labs  Lab 07/15/17 0621 07/18/17 1355 07/20/17 1526  NA 136 136 133*  K 4.0 3.8 3.7  CL 94* 97* 93*  CO2 26 24 22   GLUCOSE 94 129* 102*  BUN 43* 53* 43*  CREATININE 11.60* 13.13* 11.84*  CALCIUM 9.1 8.9 8.9  PHOS 6.0* 6.3* 5.5*   Liver Function Tests: Recent Labs  Lab 07/15/17 0621 07/18/17 1355 07/20/17 1526  ALBUMIN 3.9 3.8 4.0   No results for input(s): LIPASE, AMYLASE in the last 168 hours. CBC: Recent Labs  Lab 07/15/17 0621 07/18/17 1355 07/20/17 1527  WBC 6.0 6.2 6.7  NEUTROABS  --  2.6  --   HGB 10.2* 9.0* 10.5*  HCT 31.9* 28.8* 32.3*  MCV 93.8 92.9 91.5  PLT 327 299 343   Blood Culture    Component Value Date/Time   SDES BLOOD RIGHT FOOT 06/24/2017 2255   SPECREQUEST  06/24/2017 2255    BOTTLES DRAWN AEROBIC ONLY Blood Culture results may not be optimal due to an inadequate volume of blood received in culture bottles   CULT  06/24/2017 2255    NO GROWTH 5 DAYS Performed at [facility], [address], [place], [place] [address]    REPTSTATUS 06/30/2017 FINAL 06/24/2017 2255    Cardiac Enzymes: No results for input(s): CKTOTAL, CKMB, CKMBINDEX, TROPONINI in the last 168 hours. CBG: Recent Labs  Lab 07/20/17 1122 07/20/17 1858 07/20/17 2122 07/21/17 0751 07/21/17 1059  GLUCAP 154* 198* 125* 113* 114*   Iron Studies: No results for  input(s): IRON, TIBC, TRANSFERRIN, FERRITIN in the last 72 hours. @lablastinr3 @ Studies/Results: No results found. Medications: . sodium chloride    . sodium chloride     . aspirin EC  81 mg Oral Daily  . atorvastatin  80 mg Oral q1800  . calcitRIOL  0.5 mcg Oral Q M,W,F-HD  . clopidogrel  75 mg Oral Daily  . darbepoetin (ARANESP) injection - DIALYSIS  40 mcg Intravenous Q Wed-HD  . heparin injection (subcutaneous)  5,000 Units Subcutaneous Q8H  . heparin  5,000 Units Dialysis Once in dialysis  . insulin aspart  0-9 Units Subcutaneous TID WC  . latanoprost  1 drop Both Eyes Daily  . metoprolol tartrate  12.5 mg Oral BID  . pantoprazole  40 mg Oral Q0600  . sertraline  50 mg Oral QHS  . sevelamer carbonate  2,400 mg Oral TID WC     Dialysis Orders: [facility] MWF 4h   98kg  2/2 bath Hep 5000  LUE AVG  -Mircera200 q 2 wks (last 5/1)  -Venofer50 q wk  -Calcitriol 0.5TIW  Assessment: 1. Debility/ memory impairment - w/ cognitive and physical decline after recent cardiac arrest; presumed ABI related to arrest. SW's attempting SNF placement.  2. Atypical chest  pain- at site of AICD in left mid-axillary region. Device/implant wound check per EP. No issues. Troponin 0.03 flat trend. VQ scan negative 3. ESRDMWF via LUA AVG. HD Friday usual heparin.  4. Anemia:Hgb 9.0 Last ESA 07/06/17. Aranesp with HD 40 /wk on wed 5. CKD-MBD:stable cont with calcitriol and renagel Ca 8.9 Phos 6.3 Increase binder dose. 6. Nutrition:Albumin 3.8 renal/carb mod diet 7. Hypertension/Volume: has lost weight 98 > 90 kg here.  Lower edw at dc.  8. DM per primary svc 9. Depression  Plan - HD Friday, SNF placement   Vinson Moselle MD [facility] pager [phone]   07/21/2017, 1:25 PM

## 2017-07-21 NOTE — Clinical Social Work Note (Addendum)
CSW spoke with Dyann Ruddle at [place]. She stated there are no updates yet. The NP has reviewed the clinicals but they are waiting on the medical director to review. Dyann Ruddle will call with updates. MD aware.  Dayton Scrape, [place]  12:07 pm CSW met with patient and daughter at bedside and provided update.  Dayton Scrape, [place] [phone]  4:23 pm Patient has been clipped to [facility] on [address] in [place] on a TTS schedule. First treatment is Tuesday 5/21. He needs to be there at 9:45 am for first treatment to complete paperwork. Chair time is 10:15 am. SNF, attending MD, and nephrology MD aware. Patient will go to HD on Saturday morning instead of tomorrow and then discharge Saturday afternoon. CSW called [employer] [employer] to set up transport for 3:00 pm. They are working on getting a driver and will call back this afternoon or tomorrow morning. If they are unable to get a driver, Surveyor, quantity of social work advised to call [employer]. Patient has been updated with the above information and will notify family.  Dayton Scrape, [place]

## 2017-07-21 NOTE — Progress Notes (Signed)
Nutrition Brief Note  Chart reviewed due to LOS. Patient with weight fluctuations related to fluid shifts with HD. I/O -6.4 L since admission.   Body mass index is 30.95 kg/m. Patient meets criteria for obesity based on current BMI.   Current diet order is low sodium heart healthy, patient is consuming approximately 100% of meals at this time. Labs and medications reviewed.   Nutrition focused physical exam completed.  No muscle or subcutaneous fat depletion noticed.  Arrangements are being made for dialysis treatments to be changed to a center in [place] near the SNF to which he will be discharged.   No nutrition interventions warranted at this time. If nutrition issues arise, please consult RD.   Joaquin Courts, RD, LDN, CNSC Pager [phone] After Hours Pager [phone]

## 2017-07-22 LAB — GLUCOSE, CAPILLARY
GLUCOSE-CAPILLARY: 114 mg/dL — AB (ref 65–99)
GLUCOSE-CAPILLARY: 125 mg/dL — AB (ref 65–99)
GLUCOSE-CAPILLARY: 106 mg/dL — AB (ref 65–99)
Glucose-Capillary: 100 mg/dL — ABNORMAL HIGH (ref 65–99)
Glucose-Capillary: 97 mg/dL (ref 65–99)

## 2017-07-22 MED ORDER — RENA-VITE PO TABS
1.0000 | ORAL_TABLET | Freq: Every day | ORAL | Status: DC
  Administered 2017-07-22: 1 via ORAL
  Filled 2017-07-22: qty 1

## 2017-07-22 NOTE — Progress Notes (Signed)
[facility] Progress Note   Dialysis Orders: [facility] MWF 4h   98kg  2/2 bath Hep 5000  LUE AVG  -Mircera200 q 2 wks (last 5/1)  -Venofer50 q wk  -Calcitriol 0.5TIW   Assessment/Plan: 1. Debility/ memory impairment - w/ cognitive and physical decline after recent cardiac arrest; presumed ABI related to arrest. SW's attempting SNF placement. Plan is for d/c Saturday after HD  2. Atypical chest pain- at site of AICD in left mid-axillary region.Device/implant wound check per EP. No issues. Troponin 0.03 flat trend.VQ scan negative 3. MGNOIBBCWU LUA AVG. Transitioning to TTS schedule for d/c - HD deferred until Sat first round- transfer to [facility] [address]  Appreciate all SW help in this transition 4. Anemia:Hgb9.0> 10.5 5/15 Last ESA 07/06/17. Aranesp with HD 40 /wk on wed 5. CKD-MBD:stable cont with calcitriol and renagel P 5.5 Wed   6. Nutrition:Albumin 4 renal/carb mod diet/add vits 7. Hypertension/Volume: has lost weight 98 > 90 kg here.  post wt 89.5 Wed  8. DM per primary svc 9. Depression  Sheffield Slider, PA-C [facility] Beeper [phone] 07/22/2017,3:00 PM  LOS: 15 days   Pt seen, examined and agree w A/P as above.  Vinson Moselle MD [employer] pager [phone]   07/22/2017, 3:00 PM    Subjective:  C/o cold food. Sleeping when breakfast came.    Objective Vitals:   07/21/17 2113 07/21/17 2330 07/22/17 0607 07/22/17 1449  BP: 117/78  105/67 133/80  Pulse: 91  82 99  Resp: 19 15    Temp: 98.4 F (36.9 C)   98.2 F (36.8 C)  TempSrc: Oral   Oral  SpO2: 100%  97% 100%  Weight:      Height:       Physical Exam General: NAD irritable Heart: RRR Lungs: no rales Abdomen: soft NT + BS Extremities: no LE edema Dialysis Access:  Left upper AVGG + bruit   Additional Objective Labs: Basic Metabolic Panel: Recent Labs  Lab 07/18/17 1355 07/20/17 1526  NA 136 133*  K 3.8 3.7  CL 97* 93*   CO2 24 22  GLUCOSE 129* 102*  BUN 53* 43*  CREATININE 13.13* 11.84*  CALCIUM 8.9 8.9  PHOS 6.3* 5.5*   Liver Function Tests: Recent Labs  Lab 07/18/17 1355 07/20/17 1526  ALBUMIN 3.8 4.0   No results for input(s): LIPASE, AMYLASE in the last 168 hours. CBC: Recent Labs  Lab 07/18/17 1355 07/20/17 1527  WBC 6.2 6.7  NEUTROABS 2.6  --   HGB 9.0* 10.5*  HCT 28.8* 32.3*  MCV 92.9 91.5  PLT 299 343   Blood Culture    Component Value Date/Time   SDES BLOOD RIGHT FOOT 06/24/2017 2255   SPECREQUEST  06/24/2017 2255    BOTTLES DRAWN AEROBIC ONLY Blood Culture results may not be optimal due to an inadequate volume of blood received in culture bottles   CULT  06/24/2017 2255    NO GROWTH 5 DAYS Performed at [facility], [address], [place], [place] [address]    REPTSTATUS 06/30/2017 FINAL 06/24/2017 2255    Cardiac Enzymes: No results for input(s): CKTOTAL, CKMB, CKMBINDEX, TROPONINI in the last 168 hours. CBG: Recent Labs  Lab 07/21/17 1059 07/21/17 1609 07/21/17 2132 07/22/17 0759 07/22/17 1201  GLUCAP 114* 152* 115* 114* 125*   Iron Studies: No results for input(s): IRON, TIBC, TRANSFERRIN, FERRITIN in the last 72 hours. Lab Results  Component Value Date  INR 1.02 06/24/2017   INR 0.98 05/13/2017   INR 0.99 01/14/2017   Studies/Results: No results found. Medications:  . aspirin EC  81 mg Oral Daily  . atorvastatin  80 mg Oral q1800  . calcitRIOL  0.5 mcg Oral Q M,W,F-HD  . clopidogrel  75 mg Oral Daily  . darbepoetin (ARANESP) injection - DIALYSIS  40 mcg Intravenous Q Wed-HD  . heparin injection (subcutaneous)  5,000 Units Subcutaneous Q8H  . insulin aspart  0-9 Units Subcutaneous TID WC  . latanoprost  1 drop Both Eyes Daily  . metoprolol tartrate  12.5 mg Oral BID  . multivitamin  1 tablet Oral QHS  . pantoprazole  40 mg Oral Q0600  . sertraline  50 mg Oral QHS  . sevelamer carbonate  2,400 mg Oral TID WC

## 2017-07-22 NOTE — Progress Notes (Cosign Needed)
[facility] Progress Note   Dialysis Orders: [facility] MWF 4h   98kg  2/2 bath Hep 5000  LUE AVG  -Mircera200 q 2 wks (last 5/1)  -Venofer50 q wk  -Calcitriol 0.5TIW   Assessment/Plan: 1. Debility/ memory impairment - w/ cognitive and physical decline after recent cardiac arrest; presumed ABI related to arrest. SW's attempting SNF placement. Plan is for d/c Saturday after HD  2. Atypical chest pain- at site of AICD in left mid-axillary region.Device/implant wound check per EP. No issues. Troponin 0.03 flat trend.VQ scan negative 3. CBULAGTXMI LUA AVG. Transitioning to TTS schedule for d/c - HD deferred until Sat first round- transfer to [facility] [address]  Appreciate all SW help in this transition 4. Anemia:Hgb9.0> 10.5 5/15 Last ESA 07/06/17. Aranesp with HD 40 /wk on wed 5. CKD-MBD:stable cont with calcitriol and renagel P 5.5 Wed   6. Nutrition:Albumin 4 renal/carb mod diet/add vits 7. Hypertension/Volume: has lost weight 98 > 90 kg here.  post wt 89.5 Wed  8. DM per primary svc 9. Depression  Sheffield Slider, PA-C [facility] [phone] 07/22/2017,9:18 AM  LOS: 15 days   Subjective:  C/o cold food. Sleeping when breakfast came.    Objective Vitals:   07/21/17 1407 07/21/17 2113 07/21/17 2330 07/22/17 0607  BP: 111/81 117/78  105/67  Pulse: 73 91  82  Resp: 15 19 15    Temp: 98.2 F (36.8 C) 98.4 F (36.9 C)    TempSrc: Oral Oral    SpO2: 96% 100%  97%  Weight:      Height:       Physical Exam General: NAD irritable Heart: RRR Lungs: no rales Abdomen: soft NT + BS Extremities: no LE edema Dialysis Access:  Left upper AVGG + bruit   Additional Objective Labs: Basic Metabolic Panel: Recent Labs  Lab 07/18/17 1355 07/20/17 1526  NA 136 133*  K 3.8 3.7  CL 97* 93*  CO2 24 22  GLUCOSE 129* 102*  BUN 53* 43*  CREATININE 13.13* 11.84*  CALCIUM 8.9 8.9  PHOS 6.3* 5.5*   Liver Function  Tests: Recent Labs  Lab 07/18/17 1355 07/20/17 1526  ALBUMIN 3.8 4.0   No results for input(s): LIPASE, AMYLASE in the last 168 hours. CBC: Recent Labs  Lab 07/18/17 1355 07/20/17 1527  WBC 6.2 6.7  NEUTROABS 2.6  --   HGB 9.0* 10.5*  HCT 28.8* 32.3*  MCV 92.9 91.5  PLT 299 343   Blood Culture    Component Value Date/Time   SDES BLOOD RIGHT FOOT 06/24/2017 2255   SPECREQUEST  06/24/2017 2255    BOTTLES DRAWN AEROBIC ONLY Blood Culture results may not be optimal due to an inadequate volume of blood received in culture bottles   CULT  06/24/2017 2255    NO GROWTH 5 DAYS Performed at [facility], [address], [place], [place] [address]    REPTSTATUS 06/30/2017 FINAL 06/24/2017 2255    Cardiac Enzymes: No results for input(s): CKTOTAL, CKMB, CKMBINDEX, TROPONINI in the last 168 hours. CBG: Recent Labs  Lab 07/21/17 0751 07/21/17 1059 07/21/17 1609 07/21/17 2132 07/22/17 0759  GLUCAP 113* 114* 152* 115* 114*   Iron Studies: No results for input(s): IRON, TIBC, TRANSFERRIN, FERRITIN in the last 72 hours. Lab Results  Component Value Date   INR 1.02 06/24/2017   INR 0.98 05/13/2017   INR 0.99 01/14/2017   Studies/Results: No results found. Medications:  . aspirin EC  81  mg Oral Daily  . atorvastatin  80 mg Oral q1800  . calcitRIOL  0.5 mcg Oral Q M,W,F-HD  . clopidogrel  75 mg Oral Daily  . darbepoetin (ARANESP) injection - DIALYSIS  40 mcg Intravenous Q Wed-HD  . heparin injection (subcutaneous)  5,000 Units Subcutaneous Q8H  . insulin aspart  0-9 Units Subcutaneous TID WC  . latanoprost  1 drop Both Eyes Daily  . metoprolol tartrate  12.5 mg Oral BID  . pantoprazole  40 mg Oral Q0600  . sertraline  50 mg Oral QHS  . sevelamer carbonate  2,400 mg Oral TID WC

## 2017-07-22 NOTE — Progress Notes (Addendum)
Physical Therapy Treatment Patient Details Name: Richard Barnett MRN: [id number] DOB: 08/23/1955 Today's Date: 07/22/2017    History of Present Illness 62 year old Richard Barnett with history of end-stage renal disease, coronary artery disease with prior stenting in 2018, recent admission for cardiac arrest, admission from April 19 April 29th 2019 when he underwent a cardiac cath on April 23 which showed diffuse disease in the LAD with 40 to 50% narrowing, currently has an ICD in place placed on April 24, discharged in stable condition was readmitted with chest pain.  This is associated with diaphoresis.  Cardiology (EP) saw patient last week.  Nephrology was consulted.    PT Comments    Patient performed well. Processing of instructions remains slow. Bed mobility independent with supervision only for safety. Transfers with supervision for safety. Ambulation without assistive device 300 feet with mild LOB with independent correction when looking to side to converse with therapist and when looking around unit while ambulating. Pt admitted with above diagnosis. Pt currently with functional limitations due to the deficits listed below (see PT Problem List). Pt will benefit from skilled PT to increase their independence and safety with mobility to allow discharge to the venue listed below.     Follow Up Recommendations  SNF;Supervision/Assistance - 24 hour     Equipment Recommendations  None recommended by PT    Recommendations for Other Services       Precautions / Restrictions Precautions Precautions: Fall;ICD/Pacemaker Restrictions Weight Bearing Restrictions: No Other Position/Activity Restrictions: LUE pacemaker precautions    Mobility  Bed Mobility Overal bed mobility: Needs Assistance Bed Mobility: Supine to Sit;Sit to Supine     Supine to sit: Supervision Sit to supine: Supervision Sit to sidelying: Supervision    Transfers Overall transfer level: Needs assistance Equipment used:  None Transfers: Sit to/from Stand Sit to Stand: Supervision         General transfer comment: supervision for safety  Ambulation/Gait Ambulation/Gait assistance: Min guard Ambulation Distance (Feet): 300 Feet Assistive device: None Gait Pattern/deviations: Step-through pattern;Decreased stride length Gait velocity: Decreased   General Gait Details: Slow, slightly unsteady amb with intermittent min guard for balance and safety. 2x increased lateral sway with self-corrected LOB; fatigued upon return to recliner   Stairs             Wheelchair Mobility    Modified Rankin (Stroke Patients Only)       Balance Overall balance assessment: Needs assistance Sitting-balance support: Feet supported;No upper extremity supported Sitting balance-Leahy Scale: Good     Standing balance support: No upper extremity supported;During functional activity Standing balance-Leahy Scale: Fair                 High Level Balance Comments: Slight instability with lateral sway during higher level balance, pt able to correct 2x LOB            Cognition Arousal/Alertness: Awake/alert Behavior During Therapy: WFL for tasks assessed/performed Overall Cognitive Status: No family/caregiver present to determine baseline cognitive functioning Area of Impairment: Memory;Problem solving;Safety/judgement                     Memory: Decreased short-term memory Following Commands: Follows multi-step commands with increased time Safety/Judgement: Decreased awareness of safety;Decreased awareness of deficits   Problem Solving: Slow processing;Requires verbal cues        Exercises General Exercises - Upper Extremity Shoulder Flexion: Seated;AROM;Both;10 reps General Exercises - Lower Extremity Ankle Circles/Pumps: Seated;AROM;Both;10 reps;Strengthening Long Arc Quad: Seated;AROM;Strengthening;Both;10 reps Hip Flexion/Marching: Seated;AROM;Strengthening;Both;10  reps     General Comments        Pertinent Vitals/Pain Pain Assessment: No/denies pain    Home Living                      Prior Function            PT Goals (current goals can now be found in the care plan section) Acute Rehab PT Goals Patient Stated Goal: get home PT Goal Formulation: With patient Time For Goal Achievement: 07/22/17 Potential to Achieve Goals: Good    Frequency    Min 2X/week      PT Plan Current plan remains appropriate    Co-evaluation              AM-PAC PT "6 Clicks" Daily Activity  Outcome Measure  Difficulty turning over in bed (including adjusting bedclothes, sheets and blankets)?: A Little Difficulty moving from lying on back to sitting on the side of the bed? : A Little Difficulty sitting down on and standing up from a chair with arms (e.g., wheelchair, bedside commode, etc,.)?: A Little Help needed moving to and from a bed to chair (including a wheelchair)?: A Little Help needed walking in hospital room?: A Little Help needed climbing 3-5 steps with a railing? : A Little 6 Click Score: 18    End of Session   Activity Tolerance: Patient tolerated treatment well Patient left: with call bell/phone within reach;in chair Nurse Communication: Mobility status PT Visit Diagnosis: Other abnormalities of gait and mobility (R26.89);Difficulty in walking, not elsewhere classified (R26.2);Muscle weakness (generalized) (M62.81)     Time: 1610-9604 PT Time Calculation (min) (ACUTE ONLY): 23 min  Charges:  $Gait Training: 8-22 mins $Therapeutic Exercise: 8-22 mins                    G Codes:       Richard Barnett D. Hartnett-Rands, MS, PT [facility] PT [facility] [phone] 07/22/2017, 11:43 AM

## 2017-07-22 NOTE — Clinical Social Work Note (Signed)
[facility] [employer] will pick up patient around 2:30 pm tomorrow. They will call nursing station about 30 minutes before arrival. Unit CSW notified.  Charlynn Court, CSW [phone]

## 2017-07-22 NOTE — Progress Notes (Signed)
PROGRESS NOTE  Richard Barnett:[id number] DOB: 12-Jul-1955 DOA: 07/07/2017 PCP: [facility]   LOS: 15 days   Brief Narrative / Interim history: 62 year old Richard Barnett with history of end-stage renal disease, coronary artery disease with prior stenting in 2018, recent admission for cardiac arrest, admission from April 19 April 29th 2019 when he underwent a cardiac cath on April 23 which showed diffuse disease in the LAD with 40 to 50% narrowing, currently has an ICD in place placed on April 24, discharged in stable condition was readmitted with chest pain.  This is associated with diaphoresis.  Cardiology (EP) saw patient last week.  Nephrology was consulted.  Assessment & Plan: Principal Problem:   Chest pain Active Problems:   CAD (coronary artery disease)   Hypertension   Hyperlipidemia   Prediabetes   Anemia of chronic renal failure   Secondary hyperparathyroidism of renal origin (HCC)   Arthritis   Depression   Deep vein thrombosis (HCC)   Diabetes mellitus (HCC)   NSTEMI (non-ST elevated myocardial infarction) (HCC)   GERD (gastroesophageal reflux disease)   ESRD on dialysis (HCC)   Major depressive disorder, recurrent episode, severe (HCC)   Adjustment disorder with mixed disturbance of emotions and conduct   Cardiac arrest [facility])   Chest pain syndrome   Coronary artery disease -Cardiology evaluated patient while hospitalized, his pain is atypical, no further interventions planned at this point. He is status post cardiac arrest and has an ICD which was placed on 06/29/2017.  Continue aspirin, Lipitor, Plavix.  Wound check was done by EP last week. Stable, no chest pain.  Patient underwent cardiac catheterization in June 28, 2017, results as below   [place] LAD lesion is 30% stenosed.  Prox LAD lesion is 30% stenosed.  Ost 1st Diag lesion is 85% stenosed.  1st Mrg lesion is 95% stenosed.  Prox RCA to Mid RCA lesion is 10% stenosed.  Lat 1st Mrg lesion is  90% stenosed.  LV end diastolic pressure is moderately elevated.  The left ventricular ejection fraction is 55-65% by visual estimate.  Mid LAD-1 lesion is 60% stenosed.  Mid LAD-2 lesion is 30% stenosed.    Widely patent left main coronary artery.  Diffusely diseased LAD with 40-50% narrowing in the proximal mid and distal vessel.  Small first diagonal contains ostial 85% stenosis.  Previously placed stent in the small first obtuse marginal contains diffuse ISR with up to 95% narrowing.  There is still antegrade flow.  Generalized nonobstructive atherosclerosis is noted throughout the vessels in the circumflex territory.  Nondominant RCA.  Anterobasal hypokinesis.  Overall EF 60% with elevated EDP of 24 mmHg consistent with chronic diastolic heart failure.  Overall when this study is compared to the late 2018 images, no significant change has occurred.  RECOMMENDATIONS:   The patient has regions that are capable of producing ischemia and possibly are related to the patient's sudden death episode.  There is not a mechanical solution as the vascular territories are supplied by tiny vessels (first diagonal and first obtuse marginal) which will not have long-term benefit from stenting and or not large enough for bypass grafting.  Consider EP evaluation to discuss the pros and cons of defibrillator therapy.  Chronic diastolic CHF -He appears euvolemic, he is compensated.  Volume management per dialysis End-stage renal disease on IHD MWF -continue HD Hypertension -continue metoprolol Hyperlipidemia -Continue statin Depression -Continue sertraline Remote history of DVT -Recent VQ scan is negative, on aspirin and Plavix  Type  2 diabetes mellitus -Sliding scale insulin Debility / deconditioning -Patient felt to possibly might have had a degree of ABI, to go to SNF   DVT prophylaxis: heparin Code Status: Full code Family Communication: no family at bedside Disposition Plan:  Awaiting placement  Consultants:   Nephrology  Cardiology  Procedures:   HD  Antimicrobials:  None    Subjective: -feeling well   Objective: Vitals:   07/21/17 1407 07/21/17 2113 07/21/17 2330 07/22/17 0607  BP: 111/81 117/78  105/67  Pulse: 73 91  82  Resp: 15 19 15    Temp: 98.2 F (36.8 C) 98.4 F (36.9 C)    TempSrc: Oral Oral    SpO2: 96% 100%  97%  Weight:      Height:        Intake/Output Summary (Last 24 hours) at 07/22/2017 1108 Last data filed at 07/22/2017 9509 Gross per 24 hour  Intake 680 ml  Output -  Net 680 ml   Filed Weights   07/20/17 1330 07/20/17 1730 07/21/17 0625  Weight: 91.9 kg (202 lb 9.6 oz) 89.5 kg (197 lb 5 oz) 89.6 kg (197 lb 9.6 oz)    Examination:  Constitutional: NAD Respiratory: CTA Cardiovascular: RRR  Data Reviewed: I have independently reviewed following labs and imaging studies   CBC: Recent Labs  Lab 07/18/17 1355 07/20/17 1527  WBC 6.2 6.7  NEUTROABS 2.6  --   HGB 9.0* 10.5*  HCT 28.8* 32.3*  MCV 92.9 91.5  PLT 299 343   Basic Metabolic Panel: Recent Labs  Lab 07/18/17 1355 07/20/17 1526  NA 136 133*  K 3.8 3.7  CL 97* 93*  CO2 24 22  GLUCOSE 129* 102*  BUN 53* 43*  CREATININE 13.13* 11.84*  CALCIUM 8.9 8.9  PHOS 6.3* 5.5*   GFR: Estimated Creatinine Clearance: 6.9 mL/min (A) (by C-G formula based on SCr of 11.84 mg/dL (H)). Liver Function Tests: Recent Labs  Lab 07/18/17 1355 07/20/17 1526  ALBUMIN 3.8 4.0   No results for input(s): LIPASE, AMYLASE in the last 168 hours. No results for input(s): AMMONIA in the last 168 hours. Coagulation Profile: No results for input(s): INR, PROTIME in the last 168 hours. Cardiac Enzymes: No results for input(s): CKTOTAL, CKMB, CKMBINDEX, TROPONINI in the last 168 hours. BNP (last 3 results) No results for input(s): PROBNP in the last 8760 hours. HbA1C: No results for input(s): HGBA1C in the last 72 hours. CBG: Recent Labs  Lab 07/21/17 0751  07/21/17 1059 07/21/17 1609 07/21/17 2132 07/22/17 0759  GLUCAP 113* 114* 152* 115* 114*   Lipid Profile: No results for input(s): CHOL, HDL, LDLCALC, TRIG, CHOLHDL, LDLDIRECT in the last 72 hours. Thyroid Function Tests: No results for input(s): TSH, T4TOTAL, FREET4, T3FREE, THYROIDAB in the last 72 hours. Anemia Panel: No results for input(s): VITAMINB12, FOLATE, FERRITIN, TIBC, IRON, RETICCTPCT in the last 72 hours. Urine analysis:    Component Value Date/Time   COLORURINE YELLOW 07/07/2017 2252   APPEARANCEUR CLEAR 07/07/2017 2252   LABSPEC 1.009 07/07/2017 2252   PHURINE 9.0 (H) 07/07/2017 2252   GLUCOSEU NEGATIVE 07/07/2017 2252   HGBUR NEGATIVE 07/07/2017 2252   BILIRUBINUR NEGATIVE 07/07/2017 2252   KETONESUR NEGATIVE 07/07/2017 2252   PROTEINUR >=300 (A) 07/07/2017 2252   UROBILINOGEN 0.2 02/21/2015 0955   NITRITE NEGATIVE 07/07/2017 2252   LEUKOCYTESUR NEGATIVE 07/07/2017 2252   Sepsis Labs: Invalid input(s): PROCALCITONIN, LACTICIDVEN  No results found for this or any previous visit (from the past 240 hour(s)).  Radiology Studies: No results found.   Scheduled Meds: . aspirin EC  81 mg Oral Daily  . atorvastatin  80 mg Oral q1800  . calcitRIOL  0.5 mcg Oral Q M,W,F-HD  . clopidogrel  75 mg Oral Daily  . darbepoetin (ARANESP) injection - DIALYSIS  40 mcg Intravenous Q Wed-HD  . heparin injection (subcutaneous)  5,000 Units Subcutaneous Q8H  . insulin aspart  0-9 Units Subcutaneous TID WC  . latanoprost  1 drop Both Eyes Daily  . metoprolol tartrate  12.5 mg Oral BID  . multivitamin  1 tablet Oral QHS  . pantoprazole  40 mg Oral Q0600  . sertraline  50 mg Oral QHS  . sevelamer carbonate  2,400 mg Oral TID WC   Continuous Infusions:    Pamella Pert, MD, PhD [facility] Pager [phone]  If 7PM-7AM, please contact night-coverage [email or web address] Password [email or web address] 07/22/2017, 11:08 AM

## 2017-07-23 DIAGNOSIS — N2581 Secondary hyperparathyroidism of renal origin: Secondary | ICD-10-CM

## 2017-07-23 DIAGNOSIS — I10 Essential (primary) hypertension: Secondary | ICD-10-CM

## 2017-07-23 LAB — RENAL FUNCTION PANEL
ANION GAP: 17 — AB (ref 5–15)
Albumin: 4.2 g/dL (ref 3.5–5.0)
BUN: 56 mg/dL — ABNORMAL HIGH (ref 6–20)
CHLORIDE: 89 mmol/L — AB (ref 101–111)
CO2: 24 mmol/L (ref 22–32)
Calcium: 9.3 mg/dL (ref 8.9–10.3)
Creatinine, Ser: 14.82 mg/dL — ABNORMAL HIGH (ref 0.61–1.24)
GFR, EST AFRICAN AMERICAN: 4 mL/min — AB (ref >60)
GFR, EST NON AFRICAN AMERICAN: 3 mL/min — AB (ref >60)
Glucose, Bld: 103 mg/dL — ABNORMAL HIGH (ref 65–99)
POTASSIUM: 4.3 mmol/L (ref 3.5–5.1)
Phosphorus: 6.1 mg/dL — ABNORMAL HIGH (ref 2.5–4.6)
Sodium: 130 mmol/L — ABNORMAL LOW (ref 135–145)

## 2017-07-23 LAB — GLUCOSE, CAPILLARY: GLUCOSE-CAPILLARY: 91 mg/dL (ref 65–99)

## 2017-07-23 LAB — CBC
HEMATOCRIT: 32.2 % — AB (ref 39.0–52.0)
HEMOGLOBIN: 10.2 g/dL — AB (ref 13.0–17.0)
MCH: 28.8 pg (ref 26.0–34.0)
MCHC: 31.7 g/dL (ref 30.0–36.0)
MCV: 91 fL (ref 78.0–100.0)
Platelets: 350 10*3/uL (ref 150–400)
RBC: 3.54 MIL/uL — AB (ref 4.22–5.81)
RDW: 16.2 % — ABNORMAL HIGH (ref 11.5–15.5)
WBC: 6.1 10*3/uL (ref 4.0–10.5)

## 2017-07-23 MED ORDER — HEPARIN SODIUM (PORCINE) 1000 UNIT/ML DIALYSIS: 5000.0000 [IU] | Freq: Once | INTRAMUSCULAR | Status: DC

## 2017-07-23 NOTE — Progress Notes (Signed)
Patient will Discharge To: [facility] Anticipated DC Date: 07/23/17 Family Notified:yes, left voice message on daughter's phone Fenton Foy [phone] Transport By: Lennette Bihari [phone]   Per MD patient ready for DC to [facility] . RN, patient, patient's family, and facility notified of DC. Assessment, Fl2/Pasrr, and Discharge Summary sent to facility. RN given number for report [phone]). DC packet on chart. Ambulance transport requested for patient.   CSW signing off.  Budd Palmer LCSWA [phone]

## 2017-07-23 NOTE — Progress Notes (Signed)
Pt off unit first half of shift for HD....... Upon return assessment completed.    F/u with social worker in regards to dc to SNF in [place]. Will update pt. Pt is very anxious to leave.  [facility] in [place] at [phone], Report given to Davis Gourd LPN ( [place] with [facility])  She stated that they are ready to receive patient.

## 2017-07-23 NOTE — Progress Notes (Signed)
No changes in initial AM assessment at this time. Remove IV and tele.  Transport arrived. Pt transport to [facility]. Belongs returned to pt. Included a wallet with 500.00. Called back to receiving facility to let them know that he has 500.00 in cash on him.  Stable at time of d/c without c/o.

## 2017-07-23 NOTE — Progress Notes (Deleted)
[facility] Progress Note   Dialysis Orders: [facility] MWF 4h   98kg  2/2 bath Hep 5000  LUE AVG  -Mircera200 q 2 wks (last 5/1)  -Venofer50 q wk  -Calcitriol 0.5TIW  Assessment/Plan: 1. Debility/ memory impairment - w/ cognitive and physical decline after recent cardiac arrest; presumed ABI related to arrest. SW's attempting SNF placement. Plan is for d/c Saturday after HD  2. Atypical chest pain- at site of AICD in left mid-axillary region.Device/implant wound check per EP. No issues. Troponin 0.03 flat trend.VQ scan negative 3. [id number] LUA AVG. Transitioning to TTS schedule for d/c - For transfer to [facility] [address]  Appreciate all SW help in this transition - HD being initiated - labs pending; will forward d/c info to his new HD unit after d/c today. 4. Anemia:Hgb9.0> 10.5 5/15 Last ESA 07/06/17. 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 Nutrition:Albumin 4 renal/carb mod diet/add vits 7. Hypertension/Volume: has lost weight 98 > 90 kg here.  post wt 89.5 Wed  8. DM per primary svc 9. Depression  Sheffield Slider, PA-C [facility] Beeper [phone] 07/23/2017,8:11 AM  LOS: 16 days      Subjective:  No c/o - hopes he is really leaving today  Objective Vitals:   07/22/17 0607 07/22/17 1449 07/22/17 2136 07/23/17 0524  BP: 105/67 133/80 (!) 147/75 122/77  Pulse: 82 99 86 78  Resp:   19 12  Temp:  98.2 F (36.8 C) 98 F (36.7 C) 98.4 F (36.9 C)  TempSrc:  Oral Oral Oral  SpO2: 97% 100%  100%  Weight:    91.6 kg (201 lb 14.4 oz)  Height:       Physical Exam HD being initiated General: NAD  Heart: RRR Lungs: no rales Abdomen: soft obese ? degree of ascites NT + BS Extremities: no LE edema Dialysis Access:  Left upper AVGG + bruit  Additional Objective Labs: Basic Metabolic Panel: Recent Labs  Lab 07/18/17 1355 07/20/17 1526  NA  136 133*  K 3.8 3.7  CL 97* 93*  CO2 24 22  GLUCOSE 129* 102*  BUN 53* 43*  CREATININE 13.13* 11.84*  CALCIUM 8.9 8.9  PHOS 6.3* 5.5*   Liver Function Tests: Recent Labs  Lab 07/18/17 1355 07/20/17 1526  ALBUMIN 3.8 4.0   No results for input(s): LIPASE, AMYLASE in the last 168 hours. CBC: Recent Labs  Lab 07/18/17 1355 07/20/17 1527  WBC 6.2 6.7  NEUTROABS 2.6  --   HGB 9.0* 10.5*  HCT 28.8* 32.3*  MCV 92.9 91.5  PLT 299 343   Blood Culture    Component Value Date/Time   SDES BLOOD RIGHT FOOT 06/24/2017 2255   SPECREQUEST  06/24/2017 2255    BOTTLES DRAWN AEROBIC ONLY Blood Culture results may not be optimal due to an inadequate volume of blood received in culture bottles   CULT  06/24/2017 2255    NO GROWTH 5 DAYS Performed at [facility], [address] [place], [place] [address]    REPTSTATUS 06/30/2017 FINAL 06/24/2017 2255    Cardiac Enzymes: No results for input(s): CKTOTAL, CKMB, CKMBINDEX, TROPONINI in the last 168 hours. CBG: Recent Labs  Lab 07/22/17 0759 07/22/17 1201 07/22/17 1623 07/22/17 2211 07/22/17 2237  GLUCAP 114* 125* 106* 100* 97   Iron Studies: No results for input(s): IRON, TIBC, TRANSFERRIN, FERRITIN in the last  72 hours. Lab Results  Component Value Date   INR 1.02 06/24/2017   INR 0.98 05/13/2017   INR 0.99 01/14/2017   Studies/Results: No results found. Medications:  . aspirin EC  81 mg Oral Daily  . atorvastatin  80 mg Oral q1800  . calcitRIOL  0.5 mcg Oral Q M,W,F-HD  . clopidogrel  75 mg Oral Daily  . darbepoetin (ARANESP) injection - DIALYSIS  40 mcg Intravenous Q Wed-HD  . heparin injection (subcutaneous)  5,000 Units Subcutaneous Q8H  . insulin aspart  0-9 Units Subcutaneous TID WC  . latanoprost  1 drop Both Eyes Daily  . metoprolol tartrate  12.5 mg Oral BID  . multivitamin  1 tablet Oral QHS  . pantoprazole  40 mg Oral Q0600  . sertraline  50 mg Oral QHS  . sevelamer carbonate  2,400 mg Oral TID  WC

## 2017-07-23 NOTE — Discharge Summary (Signed)
Physician Discharge Summary  Richard Barnett JXB:[id number] DOB: Jul 12, 1955 DOA: 07/07/2017  PCP: [facility]  Admit date: 07/07/2017 Discharge date: 07/23/2017  Admitted From: home Disposition:  SNF  Recommendations for Outpatient Follow-up:  1. Follow up with PCP in 1-2 weeks 2. Continue HD as scheduled   Home Health: none Equipment/Devices: none  Discharge Condition: stable CODE STATUS: Full code Diet recommendation: renal  HPI: Per Dr. Willette Pa, 62 year old man discharged from our facility several days ago after a cardiac arrest in the field.  Now presents with chest pain likely associated with recent resuscitative efforts.  He is on hemodialysis.  He is not walking well has significant disabilities and we had referred him to a skilled nursing facility but at the time of discharge he was unwilling.  Patient now is more willing to consider skilled nursing facility given the difficulties he is having caring for himself at home.  Given his recent cardiac arrest, ICD placement on 06/29/2017, diffusely diseased LAD with 40 to 50% narrowing patient will be placed in the hospital under inpatient status will perform serial enzymes repeat EKG and continue with hemodialysis per his schedule  Hospital Course: Coronary artery disease -Cardiology evaluated patient while hospitalized, his pain is atypical, no further interventions planned at this point. He is status post cardiac arrest and has an ICD which was placed on 06/29/2017.  Continue aspirin, Lipitor, Plavix.  Wound check was done by EP last week. Stable, no chest pain.  Patient underwent cardiac catheterization in June 28, 2017, results as below   [place] LAD lesion is 30% stenosed.  Prox LAD lesion is 30% stenosed.  Ost 1st Diag lesion is 85% stenosed.  1st Mrg lesion is 95% stenosed.  Prox RCA to Mid RCA lesion is 10% stenosed.  Lat 1st Mrg lesion is 90% stenosed.  LV end diastolic pressure is moderately  elevated.  The left ventricular ejection fraction is 55-65% by visual estimate.  Mid LAD-1 lesion is 60% stenosed.  Mid LAD-2 lesion is 30% stenosed.   Widely patent left main coronary artery.  Diffusely diseased LAD with 40-50% narrowing in the proximal mid and distal vessel. Small first diagonal contains ostial 85% stenosis.  Previously placed stent in the small first obtuse marginal contains diffuse ISR with up to 95% narrowing. There is still antegrade flow. Generalized nonobstructive atherosclerosis is noted throughout the vessels in the circumflex territory.  Nondominant RCA.  Anterobasal hypokinesis. Overall EF 60% with elevated EDP of 24 mmHg consistent with chronic diastolic heart failure.  Overall when this study is compared to the late 2018 images, no significant change has occurred.  RECOMMENDATIONS:   The patient has regions that are capable of producing ischemia and possibly are related to the patient's sudden death episode. There is not a mechanical solution as the vascular territories are supplied by tiny vessels (first diagonal and first obtuse marginal) which will not have long-term benefit from stenting and or not large enough for bypass grafting.  Consider EP evaluation to discuss the pros and cons of defibrillator therapy.  Chronic diastolic CHF -He appears euvolemic, he is compensated.  Volume management per dialysis End-stage renal disease on IHD MWF -continue HD Hypertension -continue metoprolol Hyperlipidemia -Continue statin Depression -Continue sertraline Remote history of DVT -Recent VQ scan is negative, on aspirin and Plavix  Type 2 diabetes mellitus -Sliding scale insulin Debility / deconditioning -Patient felt to possibly might have had a degree of ABI, to go to [facility]    Discharge  Diagnoses:  Principal Problem:   Chest pain Active Problems:   CAD (coronary artery disease)   Hypertension   Hyperlipidemia   Prediabetes   Anemia of  chronic renal failure   Secondary hyperparathyroidism of renal origin (HCC)   Arthritis   Depression   Deep vein thrombosis (HCC)   Diabetes mellitus (HCC)   NSTEMI (non-ST elevated myocardial infarction) (HCC)   GERD (gastroesophageal reflux disease)   ESRD on dialysis (HCC)   Major depressive disorder, recurrent episode, severe (HCC)   Adjustment disorder with mixed disturbance of emotions and conduct   Cardiac arrest [facility])   Chest pain syndrome     Discharge Instructions  Discharge Instructions    Call MD for:  difficulty breathing, headache or visual disturbances   Complete by:  As directed    Call MD for:  persistant nausea and vomiting   Complete by:  As directed    Call MD for:  severe uncontrolled pain   Complete by:  As directed    Diet - low sodium heart healthy   Complete by:  As directed    Increase activity slowly   Complete by:  As directed      Allergies as of 07/23/2017      Reactions   Penicillins Anaphylaxis, Swelling, Rash   ANGIOEDEMA/"SWELLING OF ENTIRE BODY" Has patient had a PCN reaction causing immediate rash, facial/tongue/throat swelling, SOB or lightheadedness with hypotension: Yes Has patient had a PCN reaction causing severe rash involving mucus membranes or skin necrosis: No Has patient had a PCN reaction that required hospitalization: No Has patient had a PCN reaction occurring within the last 10 years: No If all of the above answers are "NO", then may proceed with Cephalosporin use.      Medication List    STOP taking these medications   oxyCODONE 5 MG immediate release tablet Commonly known as:  Oxy IR/ROXICODONE     TAKE these medications   aspirin 81 MG EC tablet Take 1 tablet (81 mg total) by mouth daily. Swallow whole.   atorvastatin 80 MG tablet Commonly known as:  LIPITOR Take 1 tablet (80 mg total) by mouth daily at 6 PM.   calcitRIOL 0.5 MCG capsule Commonly known as:  ROCALTROL Take 1 capsule (0.5 mcg total) by mouth  every Monday, Wednesday, and Friday with hemodialysis.   clopidogrel 75 MG tablet Commonly known as:  PLAVIX Take 1 tablet (75 mg total) by mouth daily.   latanoprost 0.005 % ophthalmic solution Commonly known as:  XALATAN Place 1 drop into both eyes daily.   lidocaine (PF) 1 % Soln injection Commonly known as:  XYLOCAINE Inject 5 mLs into the skin as needed (topical anesthesia for hemodialysis ifGEBAUERS is ineffective.).   lidocaine-prilocaine cream Commonly known as:  EMLA Apply 1 application topically as needed (topical anesthesia for hemodialysis if Gebauers and Lidocaine injection are ineffective.).   metoprolol tartrate 25 MG tablet Commonly known as:  LOPRESSOR Take 0.5 tablets (12.5 mg total) by mouth 2 (two) times daily.   nitroGLYCERIN 0.4 MG SL tablet Commonly known as:  NITROSTAT DISSOLVE 1 TABLET UNDER THE TONGUE AS NEEDED FOR CHEST PAIN. REPEAT AS NEEDED EVERY 5 MINUTES UP TO A TOTAL OF 3 DOSES What changed:  See the new instructions.   pentafluoroprop-tetrafluoroeth Aero Commonly known as:  GEBAUERS Apply 1 application topically as needed (topical anesthesia for hemodialysis).   RENAGEL 800 MG tablet Generic drug:  sevelamer Take 1,600 mg by mouth 3 (three) times daily with  meals.   senna-docusate 8.6-50 MG tablet Commonly known as:  Senokot-S Take 1 tablet by mouth at bedtime as needed for mild constipation.   sertraline 50 MG tablet Commonly known as:  ZOLOFT Take 1 tablet (50 mg total) by mouth at bedtime.   sevelamer carbonate 800 MG tablet Commonly known as:  RENVELA Take 2 tablets (1,600 mg total) by mouth 3 (three) times daily with meals.   SIMBRINZA 1-0.2 % Susp Generic drug:  Brinzolamide-Brimonidine Place 1 drop into both eyes 2 (two) times daily.      Follow-up Information    [facility] Follow up.   Specialty:  Family Medicine Contact information: [address] [place]  [address] [phone]        Delano Metz, MD Follow up.   Specialty:  Nephrology Contact information: [address] [place] [address] [phone]           Consultations:  Cardiology  Nephrology   Procedures/Studies:  None   Dg Chest 2 View  Result Date: 07/07/2017 CLINICAL DATA:  Mid chest pain, started this AM. Pt was post cardiac arrest on 4/19, pacemaker placed. EXAM: CHEST - 2 VIEW COMPARISON:  06/30/2017 FINDINGS: Lateral view degraded by patient arm position. Numerous leads and wires project over the chest. Midline trachea. Mild cardiomegaly. No pleural effusion or pneumothorax. No congestive failure. Clear lungs. IMPRESSION: Mild cardiomegaly, without acute disease. Electronically Signed   By: Jeronimo Greaves M.D.   On: 07/07/2017 08:58   Ct Head Wo Contrast  Result Date: 06/24/2017 CLINICAL DATA:  Status post cardiac arrest. Patient arrived from dialysis unresponsive and pulseless. Now having seizure-like activities. EXAM: CT HEAD WITHOUT CONTRAST TECHNIQUE: Contiguous axial images were obtained from the base of the skull through the vertex without intravenous contrast. COMPARISON:  12/23/2016 head CT FINDINGS: Brain: Chronic moderate small vessel ischemic disease with chronic left basal ganglial lacunar infarct. No acute intracranial hemorrhage, midline shift or edema. Redemonstration of mild to moderate ventriculomegaly. No intra-axial mass nor extra-axial fluid collections. Midline fourth ventricle and basal cisterns. No effacement. No large vascular territory infarct. Vascular: No hyperdense vessel sign.  No unexpected calcifications. Skull: Intact bony calvarium.  Small left mastoid effusion. Sinuses/Orbits: Mild ethmoid sinus mucosal thickening. No air-fluid levels. Intact orbits and globes. Other: None IMPRESSION: Redemonstration of chronic moderate small vessel ischemic disease and left basal ganglial lacunar infarct. Central atrophy. No acute intracranial  abnormality. Electronically Signed   By: Tollie Eth M.D.   On: 06/24/2017 18:30   Nm Pulmonary Vent And Perf (v/q Scan)  Result Date: 07/07/2017 CLINICAL DATA:  PE suspected, high pretest probability. Chest and back pain. EXAM: NUCLEAR MEDICINE VENTILATION - PERFUSION LUNG SCAN TECHNIQUE: Ventilation images were obtained in multiple projections using inhaled aerosol Tc-38m DTPA. Perfusion images were obtained in multiple projections after intravenous injection of Tc-2m-MAA. RADIOPHARMACEUTICALS:  31 mCi of Tc-41m DTPA aerosol inhalation and 4.2 mCi Tc74m-MAA IV COMPARISON:  Chest radiograph from earlier today. FINDINGS: Matched defect on the left lateral image correlates with a defibrillator battery pack on chest x-ray. No true parenchymal perfusion or ventilation defect is seen. IMPRESSION: Negative for pulmonary embolism. Electronically Signed   By: Marnee Spring M.D.   On: 07/07/2017 14:32   Dg Chest Port 1 View  Result Date: 06/30/2017 CLINICAL DATA:  Worsening chest pain after ICD. EXAM: PORTABLE CHEST 1 VIEW COMPARISON:  Chest radiograph June 24, 2017 FINDINGS: Cardiac silhouette is mildly enlarged, improved in appearance from  prior examination. Mediastinal silhouette is nonsuspicious. No pleural effusion or focal consolidation. LEFT cardiac defibrillator with lead tip projecting midline. LEFT chest wall subcutaneous emphysema consistent with recent placement of ICD. IMPRESSION: Mild cardiomegaly.  No acute pulmonary process. Electronically Signed   By: Awilda Metro M.D.   On: 06/30/2017 05:22   Dg Chest Portable 1 View  Result Date: 06/24/2017 CLINICAL DATA:  Status post cardiac arrest EXAM: PORTABLE CHEST 1 VIEW COMPARISON:  05/13/2017 FINDINGS: Cardiac shadow is prominent but accentuated by the portable technique. Lungs are well aerated bilaterally. No focal infiltrate or pneumothorax is seen. No acute bony abnormality is noted. IMPRESSION: No acute abnormality noted. Electronically  Signed   By: Alcide Clever M.D.   On: 06/24/2017 18:10      Subjective: - no chest pain, shortness of breath, no abdominal pain, nausea or vomiting.   Discharge Exam: Vitals:   07/22/17 2136 07/23/17 0524  BP: (!) 147/75 122/77  Pulse: 86 78  Resp: 19 12  Temp: 98 F (36.7 C) 98.4 F (36.9 C)  SpO2:  100%    General: Pt is alert, awake, not in acute distress Cardiovascular: RRR, S1/S2 +, no rubs, no gallops Respiratory: CTA bilaterally, no wheezing, no rhonchi Abdominal: Soft, NT, ND, bowel sounds + Extremities: no edema, no cyanosis    The results of significant diagnostics from this hospitalization (including imaging, microbiology, ancillary and laboratory) are listed below for reference.     Microbiology: No results found for this or any previous visit (from the past 240 hour(s)).   Labs: BNP (last 3 results) Recent Labs    08/10/16 0846 05/13/17 1845 07/07/17 0808  BNP 25.2 28.1 69.3   Basic Metabolic Panel: Recent Labs  Lab 07/18/17 1355 07/20/17 1526  NA 136 133*  K 3.8 3.7  CL 97* 93*  CO2 24 22  GLUCOSE 129* 102*  BUN 53* 43*  CREATININE 13.13* 11.84*  CALCIUM 8.9 8.9  PHOS 6.3* 5.5*   Liver Function Tests: Recent Labs  Lab 07/18/17 1355 07/20/17 1526  ALBUMIN 3.8 4.0   No results for input(s): LIPASE, AMYLASE in the last 168 hours. No results for input(s): AMMONIA in the last 168 hours. CBC: Recent Labs  Lab 07/18/17 1355 07/20/17 1527  WBC 6.2 6.7  NEUTROABS 2.6  --   HGB 9.0* 10.5*  HCT 28.8* 32.3*  MCV 92.9 91.5  PLT 299 343   Cardiac Enzymes: No results for input(s): CKTOTAL, CKMB, CKMBINDEX, TROPONINI in the last 168 hours. BNP: Invalid input(s): POCBNP CBG: Recent Labs  Lab 07/22/17 0759 07/22/17 1201 07/22/17 1623 07/22/17 2211 07/22/17 2237  GLUCAP 114* 125* 106* 100* 97   D-Dimer No results for input(s): DDIMER in the last 72 hours. Hgb A1c No results for input(s): HGBA1C in the last 72 hours. Lipid  Profile No results for input(s): CHOL, HDL, LDLCALC, TRIG, CHOLHDL, LDLDIRECT in the last 72 hours. Thyroid function studies No results for input(s): TSH, T4TOTAL, T3FREE, THYROIDAB in the last 72 hours.  Invalid input(s): FREET3 Anemia work up No results for input(s): VITAMINB12, FOLATE, FERRITIN, TIBC, IRON, RETICCTPCT in the last 72 hours. Urinalysis    Component Value Date/Time   COLORURINE YELLOW 07/07/2017 2252   APPEARANCEUR CLEAR 07/07/2017 2252   LABSPEC 1.009 07/07/2017 2252   PHURINE 9.0 (H) 07/07/2017 2252   GLUCOSEU NEGATIVE 07/07/2017 2252   HGBUR NEGATIVE 07/07/2017 2252   BILIRUBINUR NEGATIVE 07/07/2017 2252   KETONESUR NEGATIVE 07/07/2017 2252   PROTEINUR >=300 (A) 07/07/2017 2252  UROBILINOGEN 0.2 02/21/2015 0955   NITRITE NEGATIVE 07/07/2017 2252   LEUKOCYTESUR NEGATIVE 07/07/2017 2252   Sepsis Labs Invalid input(s): PROCALCITONIN,  WBC,  LACTICIDVEN   Time coordinating discharge: 40 minutes  SIGNED:  Pamella Pert, MD  [facility] 07/23/2017, 8:21 AM Pager [phone]  If 7PM-7AM, please contact night-coverage [email or web address] Password [email or web address]

## 2017-07-23 NOTE — Progress Notes (Signed)
[facility] Progress Note   Dialysis Orders: [facility] MWF 4h   98kg  2/2 bath Hep 5000  LUE AVG  -Mircera200 q 2 wks (last 5/1)  -Venofer50 q wk  -Calcitriol 0.5TIW  Assessment/Plan: 1. Debility/ memory impairment - w/ cognitive and physical decline after recent cardiac arrest; presumed ABI related to arrest. SW's attempting SNF placement. Plan is for d/c Saturday after HD  2. Atypical chest pain- at site of AICD in left mid-axillary region.Device/implant wound check per EP. No issues. Troponin 0.03 flat trend.VQ scan negative 3. ZOXWRUEAVW LUA AVG. Transitioning to TTS schedule for d/c - For transfer to [facility] [address]  Appreciate all SW help in this transition - HD being initiated - labs pending; will forward d/c info to his new HD unit after d/c today. 4. Anemia:Hgb9.0> 10.5 5/15 Last ESA 07/06/17. 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 Nutrition:Albumin 4 renal/carb mod diet/add vits 7. Hypertension/Volume: has lost weight 98 > 90 kg here.  post wt 89.5 Wed  8. DM per primary svc 9. Depression  Sheffield Slider, PA-C [facility] [phone] 07/23/2017,1:06 PM  LOS: 16 days   Pt seen, examined and agree w A/P as above.  Vinson Moselle MD [facility] pager [phone]   07/23/2017, 1:06 PM       Subjective:  No c/o - hopes he is really leaving today  Objective Vitals:   07/23/17 1130 07/23/17 1200 07/23/17 1213 07/23/17 1255  BP: 118/69 138/77 133/64 119/90  Pulse: (!) 106 97 97 (!) 106  Resp:   13 20  Temp:   97.9 F (36.6 C) 98.6 F (37 C)  TempSrc:   Oral Oral  SpO2:   98% 100%  Weight:   90.2 kg (198 lb 13.7 oz)   Height:       Physical Exam HD being initiated General: NAD  Heart: RRR Lungs: no rales Abdomen: soft obese ? degree of ascites NT + BS Extremities: no LE edema Dialysis Access:  Left upper AVGG  + bruit  Additional Objective Labs: Basic Metabolic Panel: Recent Labs  Lab 07/18/17 1355 07/20/17 1526 07/23/17 0823  NA 136 133* 130*  K 3.8 3.7 4.3  CL 97* 93* 89*  CO2 24 22 24   GLUCOSE 129* 102* 103*  BUN 53* 43* 56*  CREATININE 13.13* 11.84* 14.82*  CALCIUM 8.9 8.9 9.3  PHOS 6.3* 5.5* 6.1*   Liver Function Tests: Recent Labs  Lab 07/18/17 1355 07/20/17 1526 07/23/17 0823  ALBUMIN 3.8 4.0 4.2   No results for input(s): LIPASE, AMYLASE in the last 168 hours. CBC: Recent Labs  Lab 07/18/17 1355 07/20/17 1527 07/23/17 0823  WBC 6.2 6.7 6.1  NEUTROABS 2.6  --   --   HGB 9.0* 10.5* 10.2*  HCT 28.8* 32.3* 32.2*  MCV 92.9 91.5 91.0  PLT 299 343 350   Blood Culture    Component Value Date/Time   SDES BLOOD RIGHT FOOT 06/24/2017 2255   SPECREQUEST  06/24/2017 2255    BOTTLES DRAWN AEROBIC ONLY Blood Culture results may not be optimal due to an inadequate volume of blood received in culture bottles   CULT  06/24/2017 2255    NO GROWTH 5 DAYS Performed at [facility], [address], [place], [place] [address]    REPTSTATUS 06/30/2017 FINAL 06/24/2017 2255    Cardiac Enzymes: No results  for input(s): CKTOTAL, CKMB, CKMBINDEX, TROPONINI in the last 168 hours. CBG: Recent Labs  Lab 07/22/17 1201 07/22/17 1623 07/22/17 2211 07/22/17 2237 07/23/17 1254  GLUCAP 125* 106* 100* 97 91   Iron Studies: No results for input(s): IRON, TIBC, TRANSFERRIN, FERRITIN in the last 72 hours. Lab Results  Component Value Date   INR 1.02 06/24/2017   INR 0.98 05/13/2017   INR 0.99 01/14/2017   Studies/Results: No results found. Medications:  . aspirin EC  81 mg Oral Daily  . atorvastatin  80 mg Oral q1800  . calcitRIOL  0.5 mcg Oral Q M,W,F-HD  . clopidogrel  75 mg Oral Daily  . darbepoetin (ARANESP) injection - DIALYSIS  40 mcg Intravenous Q Wed-HD  . heparin injection (subcutaneous)  5,000 Units Subcutaneous Q8H  . [START ON 07/24/2017] heparin  5,000  Units Dialysis Once in dialysis  . insulin aspart  0-9 Units Subcutaneous TID WC  . latanoprost  1 drop Both Eyes Daily  . metoprolol tartrate  12.5 mg Oral BID  . multivitamin  1 tablet Oral QHS  . pantoprazole  40 mg Oral Q0600  . sertraline  50 mg Oral QHS  . sevelamer carbonate  2,400 mg Oral TID WC

## 2017-07-27 ENCOUNTER — Other Ambulatory Visit: Payer: Self-pay | Admitting: Internal Medicine

## 2017-10-11 ENCOUNTER — Ambulatory Visit: Payer: Self-pay

## 2018-08-08 ENCOUNTER — Telehealth: Payer: Self-pay | Admitting: Cardiology

## 2018-08-08 NOTE — Telephone Encounter (Signed)
Spoke w/ pt and he stated that he his in a facility. I informed him that we have not been able to check is ICD since he has had it implanted. Pt verbalized understanding and gave the phone to his nurse. She stated to call the following number and ask to speak w/ Okey Regal to get an appt scheduled. I will send a message to scheduling to get pt scheduled.  [phone] - [employer]

## 2018-09-04 ENCOUNTER — Telehealth: Payer: Self-pay | Admitting: Internal Medicine

## 2018-09-04 NOTE — Telephone Encounter (Signed)
New Message ° ° ° °Left message to confirm appt and answer COVID questions  °

## 2018-09-05 ENCOUNTER — Encounter: Payer: Medicaid Other | Admitting: Internal Medicine

## 2018-10-03 ENCOUNTER — Encounter: Payer: Medicaid Other | Admitting: Internal Medicine

## 2018-11-02 ENCOUNTER — Other Ambulatory Visit: Payer: Self-pay

## 2018-11-02 ENCOUNTER — Encounter: Payer: Self-pay | Admitting: Internal Medicine

## 2018-11-02 ENCOUNTER — Ambulatory Visit (INDEPENDENT_AMBULATORY_CARE_PROVIDER_SITE_OTHER): Payer: Medicaid Other | Admitting: Internal Medicine

## 2018-11-02 ENCOUNTER — Encounter (INDEPENDENT_AMBULATORY_CARE_PROVIDER_SITE_OTHER): Payer: Self-pay

## 2018-11-02 VITALS — BP 104/62 | HR 90 | Ht 67.0 in | Wt 215.6 lb

## 2018-11-02 DIAGNOSIS — Z95810 Presence of automatic (implantable) cardiac defibrillator: Secondary | ICD-10-CM | POA: Diagnosis not present

## 2018-11-02 DIAGNOSIS — I469 Cardiac arrest, cause unspecified: Secondary | ICD-10-CM | POA: Diagnosis not present

## 2018-11-02 LAB — CUP PACEART INCLINIC DEVICE CHECK
Date Time Interrogation Session: 20200827102402
Implantable Lead Implant Date: 20190424
Implantable Lead Location: 753862
Implantable Lead Model: 3401
Implantable Lead Serial Number: 122497
Implantable Pulse Generator Implant Date: 20190424
Pulse Gen Serial Number: 242349

## 2018-11-02 NOTE — Patient Instructions (Signed)
Medication Instructions:  Your physician recommends that you continue on your current medications as directed. Please refer to the Current Medication list given to you today.  Labwork: None ordered.  Testing/Procedures: None ordered.  Follow-Up: Your physician wants you to follow-up in: one year with Dr. Lovena Le.   You will receive a reminder letter in the mail two months in advance. If you don't receive a letter, please call our office to schedule the follow-up appointment.  Remote monitoring is used to monitor your ICD from home. This monitoring reduces the number of office visits required to check your device to one time per year. It allows [place] to keep an eye on the functioning of your device to ensure it is working properly. You are scheduled for a device check from home on 02/05/2019. You may send your transmission at any time that day. If you have a wireless device, the transmission will be sent automatically. After your physician reviews your transmission, you will receive a postcard with your next transmission date.  Any Other Special Instructions Will Be Listed Below (If Applicable).  If you need a refill on your cardiac medications before your next appointment, please call your pharmacy.

## 2018-11-02 NOTE — Progress Notes (Signed)
HPI Richard Barnett returns today for followup. He is a pleasant 63 yo man with a ischemic CM, ESRD on HD, s/p ICD insertion. He has not had any additional shocks since his last visit. He denies chest pain or sob. No edema. No syncope.  Allergies  Allergen Reactions  . Penicillins Anaphylaxis, Swelling and Rash    ANGIOEDEMA/"SWELLING OF ENTIRE BODY" Has patient had a PCN reaction causing immediate rash, facial/tongue/throat swelling, SOB or lightheadedness with hypotension: Yes Has patient had a PCN reaction causing severe rash involving mucus membranes or skin necrosis: No Has patient had a PCN reaction that required hospitalization: No Has patient had a PCN reaction occurring within the last 10 years: No If all of the above answers are "NO", then may proceed with Cephalosporin use.      Current Outpatient Medications  Medication Sig Dispense Refill  . acetaminophen (TYLENOL) 500 MG tablet Take 500 mg by mouth every 6 (six) hours as needed.    Marland Kitchen. amitriptyline (ELAVIL) 25 MG tablet Take 25 mg by mouth at bedtime.    Marland Kitchen. apixaban (ELIQUIS) 2.5 MG TABS tablet Take 2.5 mg by mouth 2 (two) times daily.    Marland Kitchen. aspirin 81 MG EC tablet Take 1 tablet (81 mg total) by mouth daily. Swallow whole. 30 tablet 0  . atorvastatin (LIPITOR) 80 MG tablet Take 1 tablet (80 mg total) by mouth daily at 6 PM. 30 tablet 0  . calcitRIOL (ROCALTROL) 0.5 MCG capsule Take 1 capsule (0.5 mcg total) by mouth every Monday, Wednesday, and Friday with hemodialysis. 30 capsule 0  . clopidogrel (PLAVIX) 75 MG tablet Take 1 tablet (75 mg total) by mouth daily. 30 tablet 0  . ferrous sulfate 324 MG TBEC Take 324 mg by mouth daily with breakfast.    . furosemide (LASIX) 80 MG tablet Take 160 mg by mouth. SUN, TUES, THUR, SAT    . haloperidol (HALDOL) 10 MG tablet Take 10 mg by mouth as needed.    Marland Kitchen. ibuprofen (ADVIL) 600 MG tablet Take 600 mg by mouth every 6 (six) hours as needed.    . isosorbide mononitrate (IMDUR) 30 MG  24 hr tablet Take 30 mg by mouth daily. SUN, TUES, THUR, SAT    . Lacosamide (VIMPAT) 100 MG TABS Take 100 mg by mouth 2 (two) times daily.    Marland Kitchen. lamoTRIgine (LAMICTAL) 25 MG tablet Take 25 mg by mouth 2 (two) times daily.    Marland Kitchen. latanoprost (XALATAN) 0.005 % ophthalmic solution Place 1 drop into both eyes daily.    Marland Kitchen. lidocaine, PF, (XYLOCAINE) 1 % SOLN injection Inject 5 mLs into the skin as needed (topical anesthesia for hemodialysis ifGEBAUERS is ineffective.). 5 mL 0  . lidocaine-prilocaine (EMLA) cream Apply 1 application topically as needed (topical anesthesia for hemodialysis if Gebauers and Lidocaine injection are ineffective.). 30 g 0  . loperamide (IMODIUM) 2 MG capsule Take 2 mg by mouth as needed for diarrhea or loose stools.    . memantine (NAMENDA) 5 MG tablet Take 5 mg by mouth 2 (two) times daily.    . metoprolol tartrate (LOPRESSOR) 25 MG tablet Take 0.5 tablets (12.5 mg total) by mouth 2 (two) times daily. 60 tablet 0  . metoprolol tartrate (LOPRESSOR) 25 MG tablet Take 25 mg by mouth daily. SUN, TUES, THUR, SAT    . nitroGLYCERIN (NITROSTAT) 0.4 MG SL tablet DISSOLVE 1 TABLET UNDER THE TONGUE AS NEEDED FOR CHEST PAIN. REPEAT AS NEEDED EVERY 5 MINUTES UP TO  A TOTAL OF 3 DOSES 25 tablet 2  . omeprazole (PRILOSEC) 20 MG capsule Take 20 mg by mouth daily.    . pentafluoroprop-tetrafluoroeth (GEBAUERS) AERO Apply 1 application topically as needed (topical anesthesia for hemodialysis). 30 mL 0  . pyridOXINE (B-6) 50 MG tablet Take 50 mg by mouth daily.    Marland Kitchen RENAGEL 800 MG tablet Take 1,600 mg by mouth 3 (three) times daily with meals.  4  . senna-docusate (SENOKOT-S) 8.6-50 MG tablet Take 1 tablet by mouth at bedtime as needed for mild constipation.    . sertraline (ZOLOFT) 50 MG tablet Take 1 tablet (50 mg total) by mouth at bedtime. 30 tablet 0  . sevelamer carbonate (RENVELA) 800 MG tablet Take 2 tablets (1,600 mg total) by mouth 3 (three) times daily with meals. 90 tablet 0  .  vitamin B-12 (CYANOCOBALAMIN) 1000 MCG tablet Take 1,000 mcg by mouth daily.     No current facility-administered medications for this visit.      Past Medical History:  Diagnosis Date  . Arthritis    "right leg" (08/22/2014)  . CAD (coronary artery disease)    a. CAD s/p 2 stent placement in [place] 2016. b. Neg nuc 05/2014 & 01/2016.  Marland Kitchen CKD (chronic kidney disease), stage IV (HCC)    [name] 08/22/2014  . Daily headache   . Depression    "I always get that" (08/22/2014)  . Diabetes mellitus (HCC)   . DVT (deep venous thrombosis) (HCC) ?   RLE  . GERD (gastroesophageal reflux disease)   . Hypercholesterolemia   . Hypertension     ROS:   All systems reviewed and negative except as noted in the HPI.   Past Surgical History:  Procedure Laterality Date  . AV FISTULA PLACEMENT Left 10/01/2016   Procedure: INSERTION OF 4-7MM X 45CM ARTERIOVENOUS (AV) GORE-TEX GRAFT ARM;  Surgeon: Chuck Hint, MD;  Location: [facility] OR;  Service: Vascular;  Laterality: Left;  . CORONARY ANGIOPLASTY WITH STENT PLACEMENT     "1 + 1"  . DRAINAGE AND CLOSURE OF LYMPHOCELE Left 10/29/2016   Procedure: EVACUATION OF LYMPHOCELE LEFT ARM ARTERIOVENOUS GRAFT;  Surgeon: Chuck Hint, MD;  Location: [facility] OR;  Service: Vascular;  Laterality: Left;  . IR AV DIALY SHUNT INTRO NEEDLE/INTRACATH INITIAL W/PTA/IMG LEFT  05/17/2017  . IR DIALY SHUNT INTRO NEEDLE/INTRACATH INITIAL W/IMG LEFT Left 05/16/2017  . IR PTA ADDL CENTRAL DIALYSIS SEG THRU DIALY CIRCUIT LEFT Left 05/17/2017  . IR US GUIDE VASC ACCESS LEFT  05/17/2017  . LEFT HEART CATH AND CORONARY ANGIOGRAPHY N/A 01/14/2017   Procedure: LEFT HEART CATH AND CORONARY ANGIOGRAPHY;  Surgeon: [place], Peter M, MD;  [facility] CV LAB;  Service: Cardiovascular;  Laterality: N/A;  . LEFT HEART CATH AND CORONARY ANGIOGRAPHY N/A 06/28/2017   Procedure: LEFT HEART CATH AND CORONARY ANGIOGRAPHY;  Surgeon: Lyn Records, MD;  Location: [facility] INVASIVE CV LAB;   Service: Cardiovascular;  Laterality: N/A;  . SUBQ ICD IMPLANT N/A 06/29/2017   Procedure: SUBQ ICD IMPLANT;  Surgeon: Marinus Maw, MD;  Location: [facility] INVASIVE CV LAB;  Service: Cardiovascular;  Laterality: N/A;     Family History  Problem Relation Age of Onset  . Hypertension Mother   . Kidney disease Mother   . Heart disease Mother   . Heart disease Father   . Hypertension Father      Social History   Socioeconomic History  . Marital status: Divorced    Spouse name: Not  on file  . Number of children: Not on file  . Years of education: Not on file  . Highest education level: Not on file  Occupational History  . Not on file  Social Needs  . Financial resource strain: Not on file  . Food insecurity    Worry: Not on file    Inability: Not on file  . Transportation needs    Medical: Not on file    Non-medical: Not on file  Tobacco Use  . Smoking status: Former Smoker    Packs/day: 0.00    Years: 40.00    Pack years: 0.00    Types: Cigarettes    Quit date: 08/29/2010    Years since quitting: 8.1  . Smokeless tobacco: Never Used  . Tobacco comment: "quit smoking cigarettes in  2011" 10/27/16  Substance and Sexual Activity  . Alcohol use: No    Comment: 10/27/16 "stopped in ~ 2011"  . Drug use: No    Types: "Crack" cocaine, Other-see comments    Comment: 10/27/16 "stopped in ~ 2011; all types of drugs""  . Sexual activity: Never  Lifestyle  . Physical activity    Days per week: Not on file    Minutes per session: Not on file  . Stress: Not on file  Relationships  . Social Herbalist on phone: Not on file    Gets together: Not on file    Attends religious service: Not on file    Active member of club or organization: Not on file    Attends meetings of clubs or organizations: Not on file    Relationship status: Not on file  . Intimate partner violence    Fear of current or ex partner: Not on file    Emotionally abused: Not on file    Physically  abused: Not on file    Forced sexual activity: Not on file  Other Topics Concern  . Not on file  Social History Narrative  . Not on file     BP 104/62   Pulse 90   Ht 5\' 7"  (1.702 m)   Wt 215 lb 9.6 oz (97.8 kg)   SpO2 98%   BMI 33.77 kg/m   Physical Exam:  Well appearing NAD HEENT: Unremarkable Neck:  No JVD, no thyromegally Lymphatics:  No adenopathy Back:  No CVA tenderness Lungs:  Clear with no wheezes HEART:  Regular rate rhythm, no murmurs, no rubs, no clicks Abd:  soft, positive bowel sounds, no organomegally, no rebound, no guarding Ext:  2 plus pulses, no edema, no cyanosis, no clubbing Skin:  No rashes no nodules Neuro:  CN II through XII intact, motor grossly intact  EKG - nsr   DEVICE  Normal device function.  See PaceArt for details.   Assess/Plan: 1. ICM - He denies anginal symptoms. He will continue his current meds. 2. ICD - his BSX S-ICD is working normally and has 85% battery longevity, approx 7 more years. He will recheck in with [place] in a year. We will take back over his outpatient monitoring as he does not want to have followup in [place]. 3. HTN - his bp is controlled. We will follow.  Mikle Bosworth.D.

## 2019-02-19 ENCOUNTER — Ambulatory Visit (INDEPENDENT_AMBULATORY_CARE_PROVIDER_SITE_OTHER): Payer: Medicaid Other | Admitting: *Deleted

## 2019-02-19 DIAGNOSIS — I469 Cardiac arrest, cause unspecified: Secondary | ICD-10-CM | POA: Diagnosis not present

## 2019-02-19 LAB — CUP PACEART REMOTE DEVICE CHECK
Battery Remaining Percentage: 81 %
Date Time Interrogation Session: 20201213234400
Implantable Lead Implant Date: 20190424
Implantable Lead Location: 753862
Implantable Lead Model: 3401
Implantable Lead Serial Number: 122497
Implantable Pulse Generator Implant Date: 20190424
Pulse Gen Serial Number: 242349

## 2019-03-25 NOTE — Progress Notes (Signed)
ICD remote 

## 2019-05-23 ENCOUNTER — Encounter: Payer: Medicaid Other | Admitting: Internal Medicine

## 2019-05-23 ENCOUNTER — Telehealth: Payer: Self-pay

## 2019-05-23 NOTE — Telephone Encounter (Signed)
Unable to speak  with patient to remind of missed remote transmission 

## 2019-06-05 ENCOUNTER — Encounter: Payer: Medicaid Other | Admitting: Student

## 2019-06-23 IMAGING — DX DG CHEST 1V PORT
1 series · 1 of 1 positions shown · non-contrast
Comparison: 05/13/2017

CLINICAL DATA: Status post cardiac arrest

EXAM:
PORTABLE CHEST 1 VIEW

[chest ap]
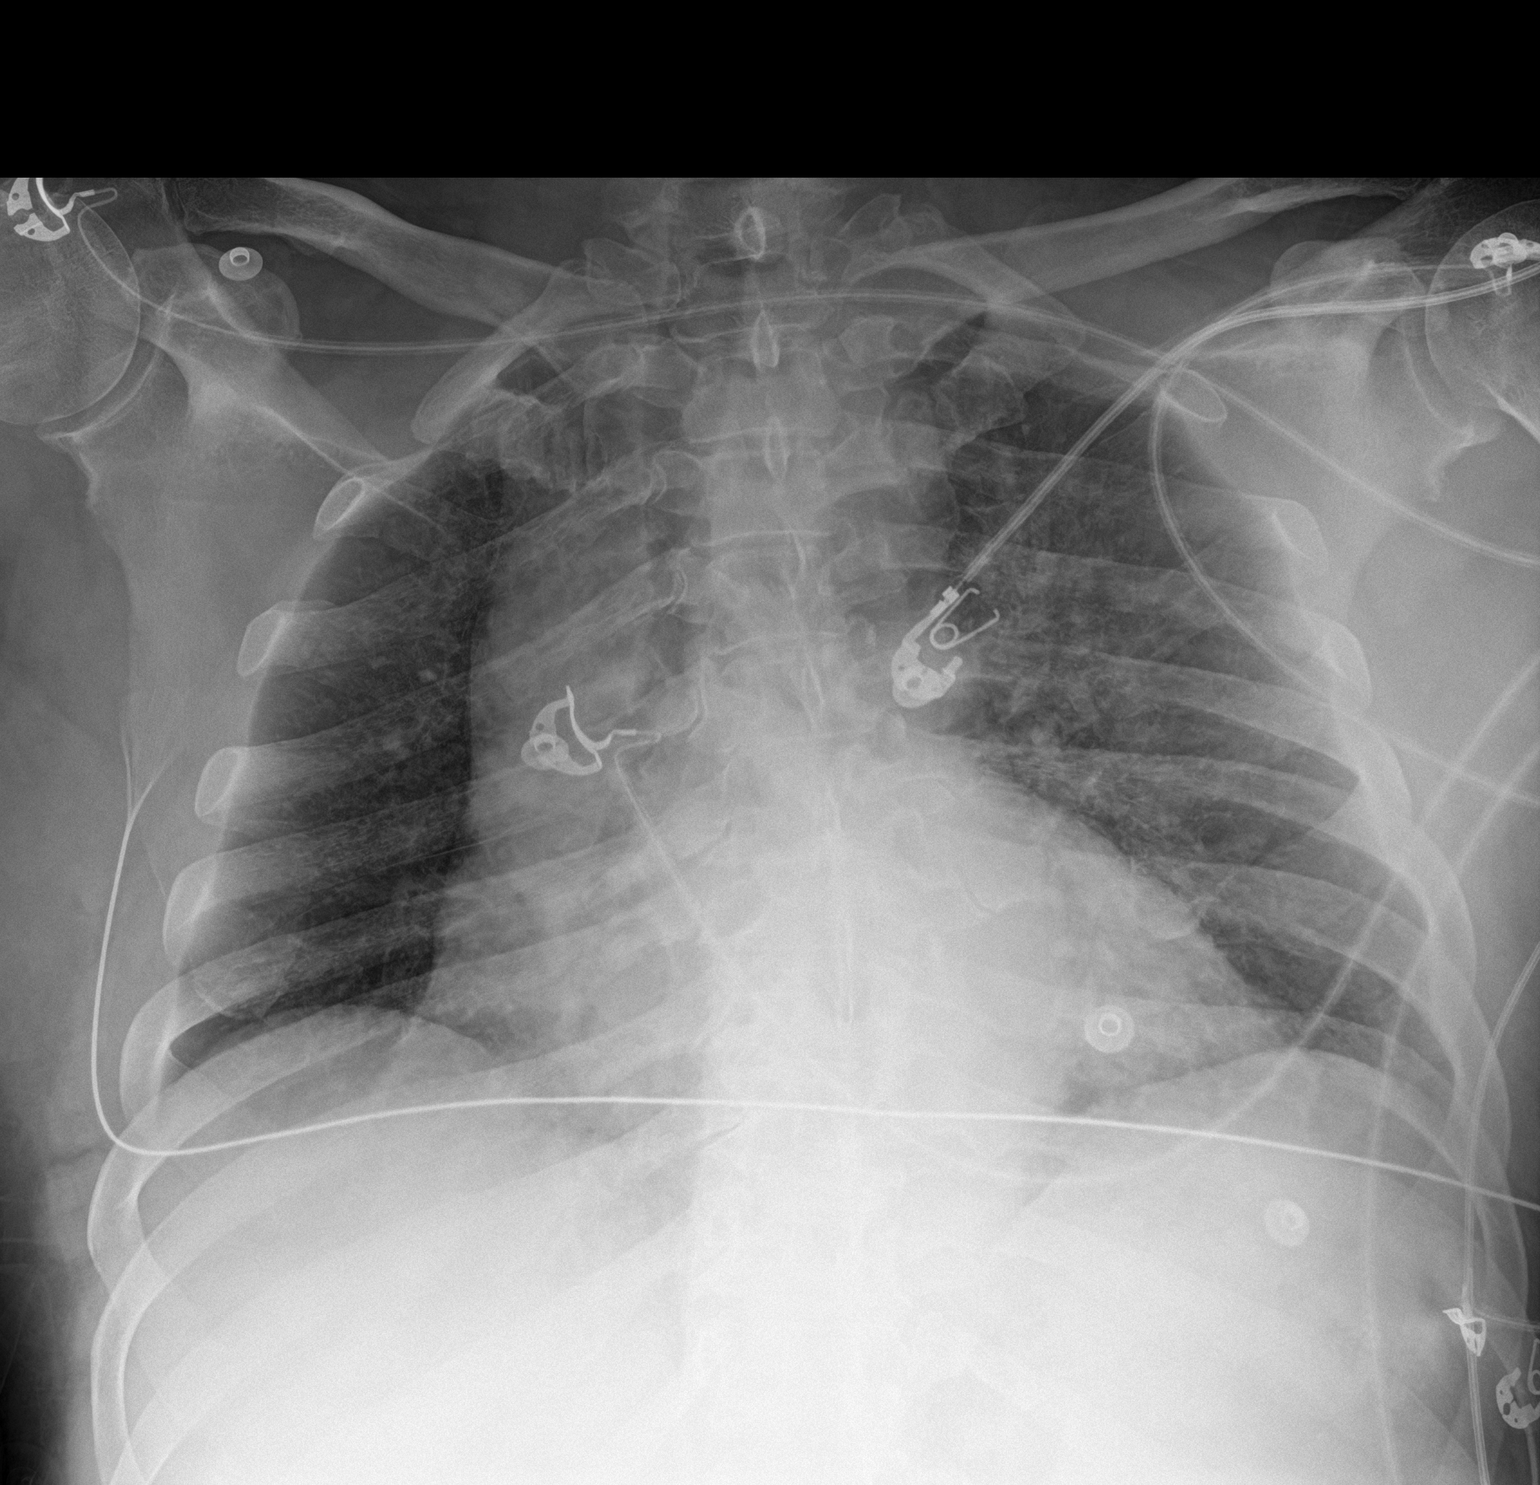

[1 of 1 positions shown; findings below may reference images not displayed]

FINDINGS: Cardiac shadow is prominent but accentuated by the portable
technique. Lungs are well aerated bilaterally. No focal infiltrate
or pneumothorax is seen. No acute bony abnormality is noted.
IMPRESSION: No acute abnormality noted.

## 2019-06-23 IMAGING — CT CT HEAD W/O CM
3 series · 14 of 37 positions shown, 16 images · non-contrast
Comparison: 12/23/2016 head CT

CLINICAL DATA: Status post cardiac arrest. Patient arrived from
dialysis unresponsive and pulseless. Now having seizure-like
activities.

EXAM:
CT HEAD WITHOUT CONTRAST
TECHNIQUE: Contiguous axial images were obtained from the base of the skull
through the vertex without intravenous contrast.

[Series 5: head without ax · axial · non-contrast · 0.33mm/px · z∈[-144,-38]mm · 6 of 33 slices shown, 8 images]
[im 5/33  brain]
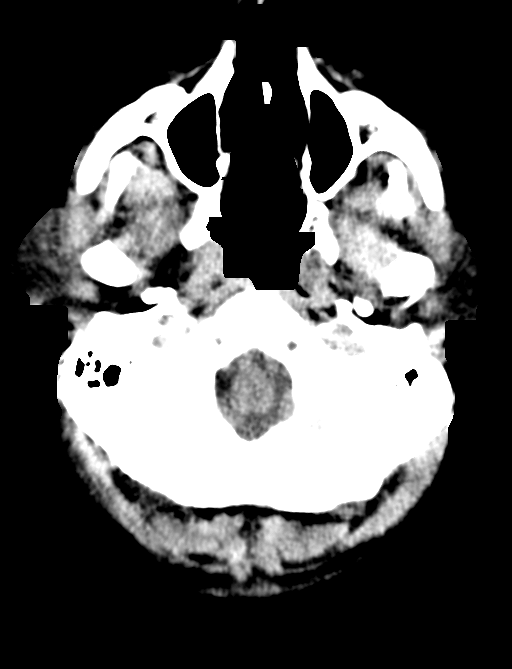
[im 5/33  bone]
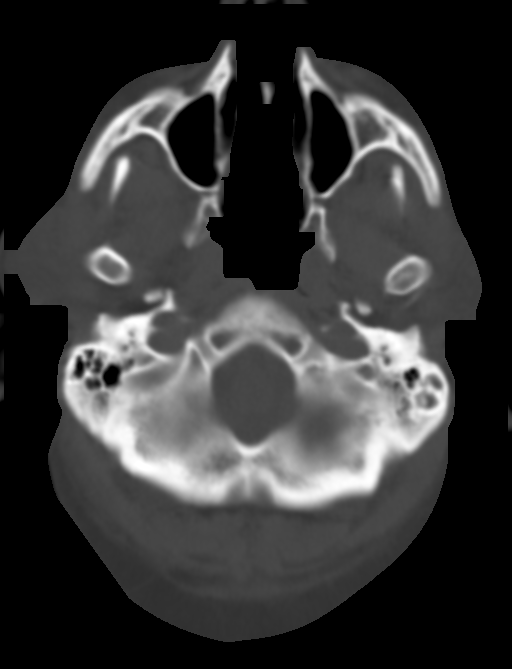
[im 10/33  brain]
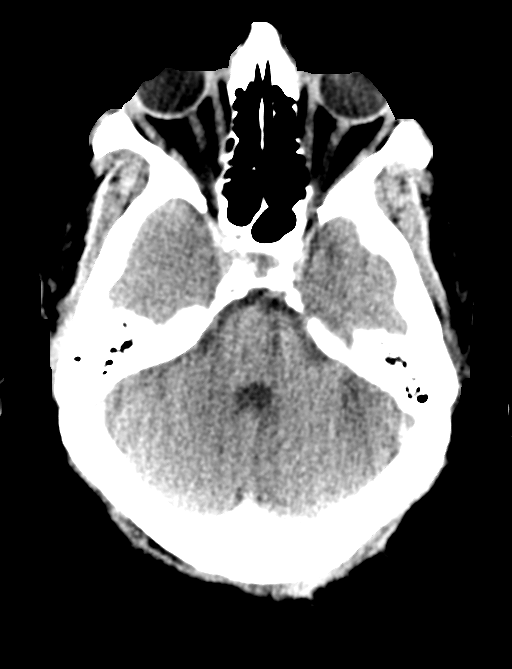
[im 14/33  brain]
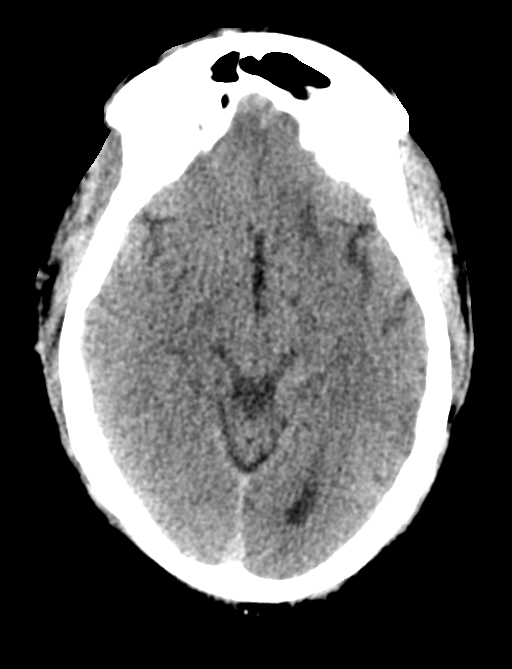
[im 19/33  brain]
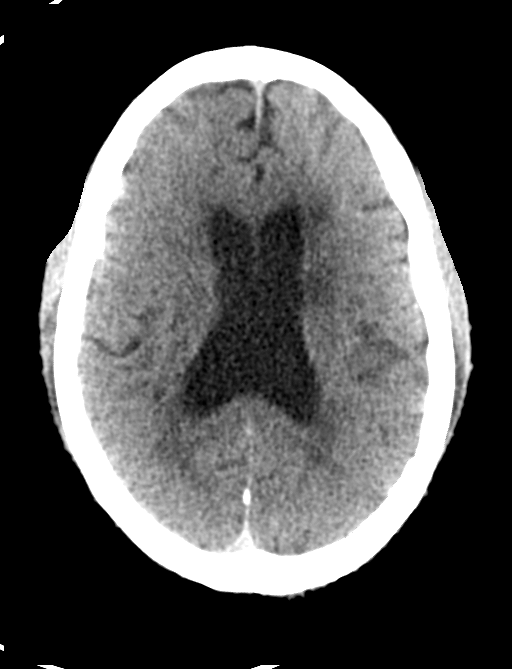
[im 23/33  brain]
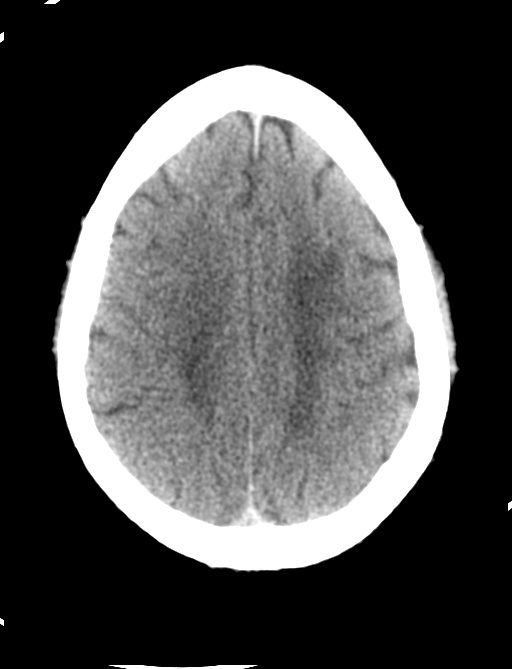
[im 23/33  bone]
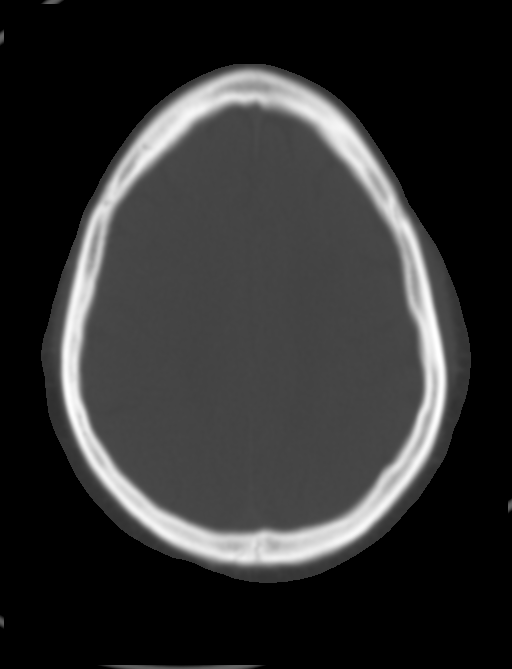
[im 28/33  brain]
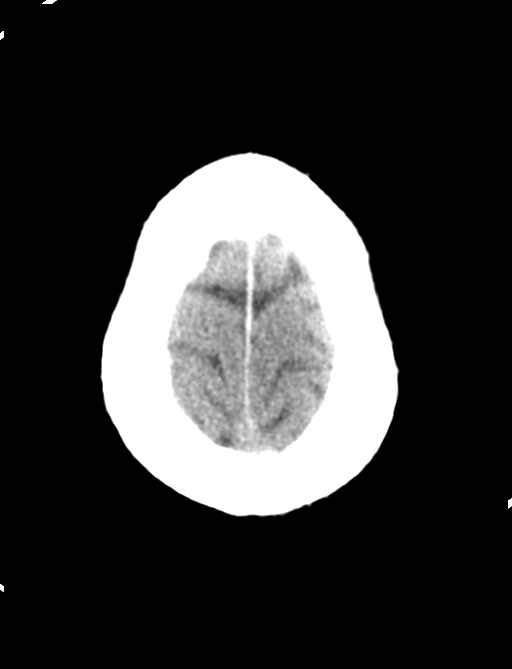

[Series 6: ax head bone · axial · 0.34mm/px · z∈[-159,-84]mm · 5 of 84 slices shown]
[im 8/84  bone]
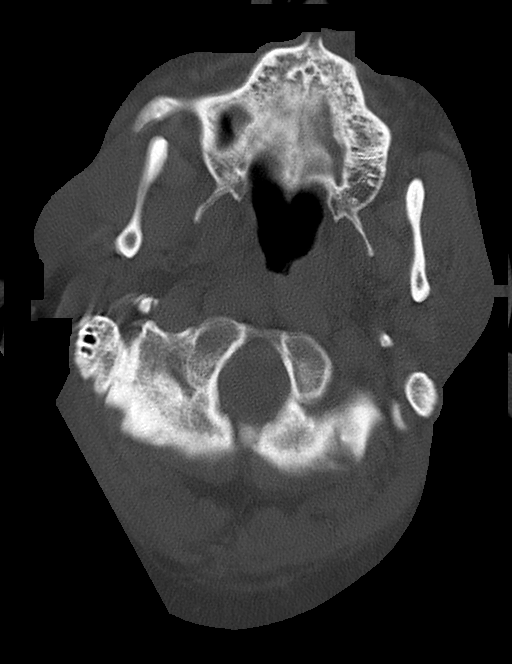
[im 16/84  bone]
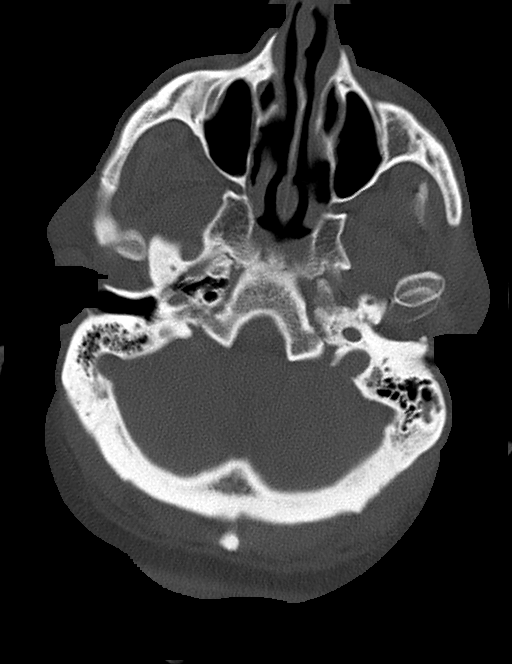
[im 28/84  bone]
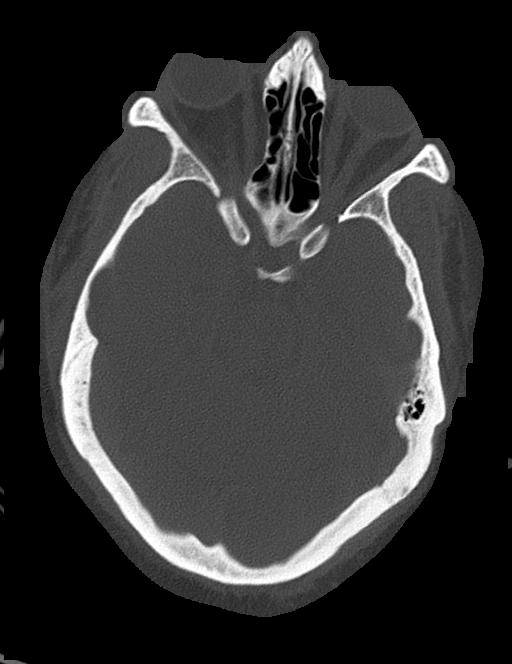
[im 36/84  bone]
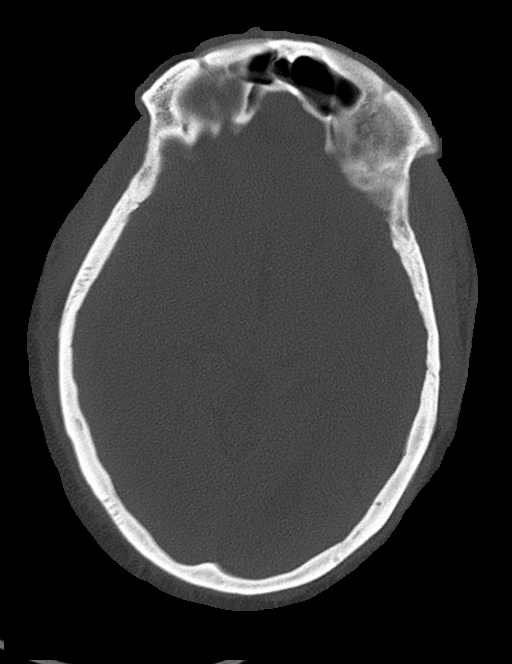
[im 48/84  bone]
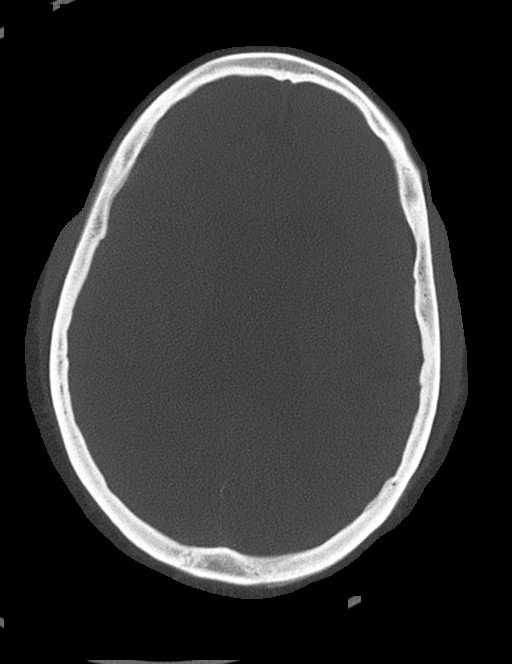

[Series 7: head without sag · sagittal · non-contrast · 0.32mm/px · 3 of 67 slices shown]
[im 29/67  brain]
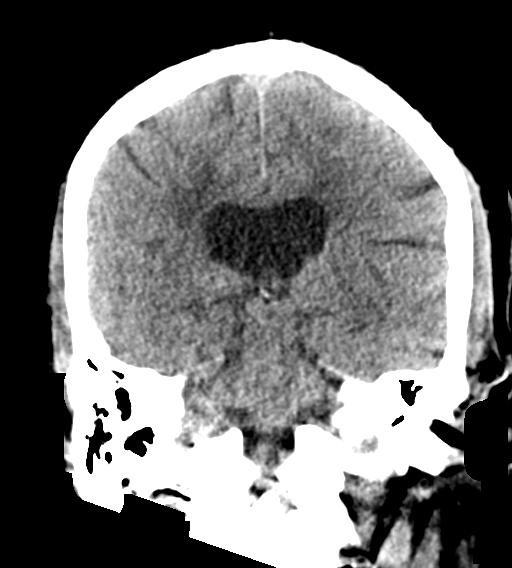
[im 34/67  brain]
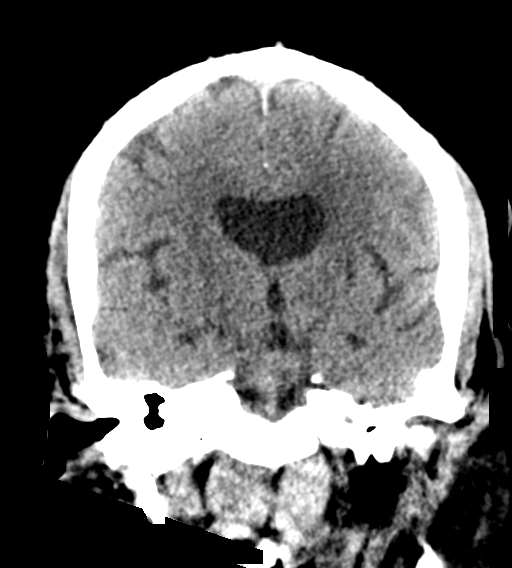
[im 39/67  brain]
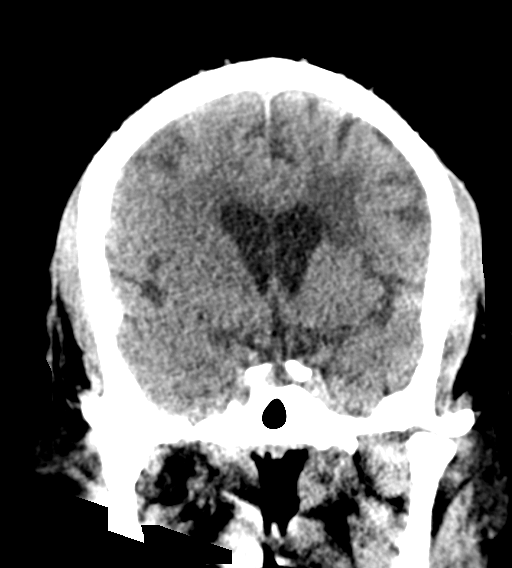

[14 of 37 positions shown; findings below may reference images not displayed]

FINDINGS: Brain: Chronic moderate small vessel ischemic disease with chronic
left basal ganglial lacunar infarct. No acute intracranial
hemorrhage, midline shift or edema. Redemonstration of mild to
moderate ventriculomegaly. No intra-axial mass nor extra-axial fluid
collections. Midline fourth ventricle and basal cisterns. No
effacement. No large vascular territory infarct.

Vascular: No hyperdense vessel sign.  No unexpected calcifications.

Skull: Intact bony calvarium.  Small left mastoid effusion.

Sinuses/Orbits: Mild ethmoid sinus mucosal thickening. No air-fluid
levels. Intact orbits and globes.

Other: None
IMPRESSION: Redemonstration of chronic moderate small vessel ischemic disease
and left basal ganglial lacunar infarct. Central atrophy. No acute
intracranial abnormality.

## 2019-07-12 ENCOUNTER — Telehealth: Payer: Self-pay

## 2019-07-18 NOTE — Telephone Encounter (Signed)
Spoke with patient to remind of missed remote transmission 

## 2019-12-06 ENCOUNTER — Ambulatory Visit (INDEPENDENT_AMBULATORY_CARE_PROVIDER_SITE_OTHER): Payer: Medicare Other

## 2019-12-06 DIAGNOSIS — I469 Cardiac arrest, cause unspecified: Secondary | ICD-10-CM

## 2019-12-06 LAB — CUP PACEART REMOTE DEVICE CHECK
Battery Remaining Percentage: 72 %
Date Time Interrogation Session: 20210930063900
Implantable Lead Implant Date: 20190424
Implantable Lead Location: 753862
Implantable Lead Model: 3401
Implantable Lead Serial Number: 122497
Implantable Pulse Generator Implant Date: 20190424
Pulse Gen Serial Number: 242349

## 2019-12-10 NOTE — Progress Notes (Signed)
Remote ICD transmission.   

## 2023-01-27 NOTE — Progress Notes (Signed)
 Nephrology Clinic Visit  01/27/2023   Referring Physician: Danelle Richard SAUNDERS, MD PCP: Richard Barnett Danelle, MD   CHIEF COMPLAINT/Reason for Consultation: ESRD   HISTORY OF PRESENT ILLNESS     BACKGROUND:   Richard Barnett is a 67 y.o. Richard Barnett with PMH significant for ESRD due to diabetes.  Patient's been on dialysis for several years, and recently moved to [facility] in October.   Interval History: Patient reports dialysis has been going well with no issues.  He had a fall at living facility on 01/21/2023 and was brought to the ED for evaluation.  No arrhythmias noted on twelve-lead EKG.  Patient to be continue to monitor closely at dialysis center.    MEDICATIONS:   Outpatient Medications Prior to Visit  Medication Sig Dispense Refill   acetaminophen  (TYLENOL ) 325 mg tablet Take two tablets (650 mg dose) by mouth 3 (three) times a day.     amLODIPine  besylate (NORVASC ) 10 mg tablet Take one tablet (10 mg dose) by mouth daily.     apixaban (ELIQUIS) 2.5 mg tablet Take one tablet (2.5 mg dose) by mouth 2 (two) times daily.     aspirin  81 mg chewable tablet Chew one tablet (81 mg dose) by mouth daily.     b complex vitamins capsule Take one capsule by mouth daily.     Cholecalciferol (VITAMIN D) 25 mcg (1000 Units) tablet Take one tablet (1,000 Units dose) by mouth every morning.     ferrous sulfate (FERROUS SULFATE) 325 (65 FE) MG tablet Take one tablet (325 mg dose) by mouth with breakfast.     lacosamide (VIMPAT) 100 mg TABS tablet Take one tablet (100 mg dose) by mouth 2 (two) times daily.     loperamide  (IMODIUM ) 2 MG capsule Take two capsules (4 mg dose) by mouth as needed for Diarrhea.     metoprolol  tartrate (LOPRESSOR ) 25 mg tablet Take one tablet (25 mg dose) by mouth 2 (two) times daily.     nitroGLYCERIN  (NITROSTAT ) 0.4 mg SL tablet Place one tablet (0.4 mg dose) under the tongue every 5 (five) minutes as needed for Chest pain.     OLANZapine (ZYPREXA) 10 mg tablet Take one  tablet (10 mg dose) by mouth at bedtime.     sevelamer  (RENAGEL ) 800 MG tablet Take one tablet (800 mg dose) by mouth 3 (three) times daily with meals. (Patient taking differently: Take two tablets (1,600 mg dose) by mouth 3 (three) times daily with meals.) 270 tablet 3   Valbenazine Tosylate (INGREZZA) 40 MG CAPS Take one capsule (40 mg dose) by mouth at bedtime.     Facility-Administered Medications Prior to Visit  Medication Dose Route Frequency Provider Last Rate Last Admin   midazolam  (VERSED ) injection   IntraVENous PRN Olivia LITTIE Seeds, CRNA   1 mg at 08/18/18 1050     Review of systems:   ROS  PHYSICAL EXAMINATION:  Vitals:   01/27/23 1431  Weight: 189 lb 6.4 oz (85.9 kg)    Physical Exam General: Pleasant, A/O x3, in NAD HEENT: Atraumatic, normocephalic, MMM, oropharynx clear with no exudate Cardiovascular: Regular rate/rhythm, nl S1/S2, no M/R/G Pulmonary: Lungs clear to auscultation bilaterally Gastrointestinal: Soft, NT, ND MSK: Full ROM, no LE edema Skin: No rashes Neurological: CN II-XII grossly intact Psychiatric: Pleasant and cooperative Dialysis Access: Right upper extremity AVG, good thrill       Renal Disease Workup / relevant labs:   No results found for: SPECGRAV, PHUA, COLOR, PROTUR, BLOOD, GLUCOSEUR, KETONEUA, NITRITE,  LEUKOCYTES, RBCUR, WBCUR, EPITHELIAL, BACTERIA, CASTS, CRYSTALS, SQUAMEPITHE  No results found for: PROTCREATUR, CREATPROT  Assessment & Plan:  # ESRD - The patient's ESRD is felt most likely secondary to DM2 - the patient is on hemodialysis MWF at [facility] - dialysis adequacy: Patient's Kt/V of 1.41 is currently at target. Continue current dialysis prescription. - dialysis access: TDC functioning well, no evidence of infection. Continue regular monitoring at the outpatient dialysis center. We will cont to use heparin  per protocol to avoid clotting. - transplant candidacy: Referral sent -  nutrition: Albumin  4.1. Continue to see dietician as needed at the dialysis unit - Bone and Mineral Metabolism: Secondary hyperparathyroidism and hyperphosphatemia.  This is being managed via the phosphate binder, vitamin D  analog, and calcimimetic protocols at outpatient dialysis.  I reviewed the importance of compliance with binders and the low phosphorus diet. - Anemia of CKD:  This is being managed via the ESA and iron protocols at outpatient dialysis. - Electrolytes/acid base: Potassium has been running high: I reviewed high potassium foods with patient, and it appears that he has been eating a good amount of potatoes and tomatoes, which we discussed limiting.  In addition, continue Veltassa 8.4 g daily for him. - goals of care: Full Code - I reviewed the monthly dialysis labs were reviewed with the patient today. I reviewed the patient's medication list for appropriated dosing based on HD/ESRD.  # Hypertension: BP Readings from Last 3 Encounters:  01/21/23 (!) 140/98  12/01/21 129/82  10/23/20 126/81   - EDW: 93 kg.  Continues to work on fluid restriction. - continue metoprolol /isordil   # Cardiac arrest: --Out of hospital cardiac arrest in April 2019 --Implantable defibrillator placed by EP cardiology -- Ongoing f/u with Cardiology for ICD management, monitoring  # Coronary artery disease: --Multiple stents placed in 2017 2019 --Patient to continue on aspirin  and atorvastatin  -- Continue Cardiology f/u  # Type 2 diabetes: --Blood sugars currently controlled with diet only, patient to continue close blood sugar checks  # Atrial fibrillation: --Continue Eliquis for anticoagulation --Continue metoprolol  for rate control  # Hyperlipidemia: --Continue atorvastatin   # Major depressive disorder: --Continue amitriptyline and Zoloft    # health maintenance   - immunizations to be given at hemodialysis unit  # Disposition:   Follow up in about 6 months (around 07/27/2023). or  sooner PRN for symptoms or interim labs   Future Appointments  Date Time Provider Department Center  01/27/2023  3:00 PM Prentice VEAR Cheshire, MD [facility]    Patient's Medications  New Prescriptions   No medications on file  Discontinued Medications   No medications on file    I discussed the care plan as well as risks/benefits of the proposed therapies, and the patient/caregiver demonstrated understanding of the plan.  Please be aware that the above note was dictated using voice recognition software. Incorrect transcription may occur inadvertently and not be corrected on review.     No orders of the defined types were placed in this encounter.    Electronically signed by: Prentice VEAR Cheshire, MD 01/27/2023 2:53 PM
